# Patient Record
Sex: Female | Born: 1940
Health system: Southern US, Community
[De-identification: ages and names within clinical notes are randomized; demographics above are authoritative.]

## PROBLEM LIST (undated history)

## (undated) DIAGNOSIS — K859 Acute pancreatitis without necrosis or infection, unspecified: Secondary | ICD-10-CM

## (undated) DIAGNOSIS — I2510 Atherosclerotic heart disease of native coronary artery without angina pectoris: Secondary | ICD-10-CM

## (undated) DIAGNOSIS — K219 Gastro-esophageal reflux disease without esophagitis: Secondary | ICD-10-CM

## (undated) DIAGNOSIS — D649 Anemia, unspecified: Secondary | ICD-10-CM

## (undated) DIAGNOSIS — I10 Essential (primary) hypertension: Secondary | ICD-10-CM

## (undated) DIAGNOSIS — IMO0002 Reserved for concepts with insufficient information to code with codable children: Secondary | ICD-10-CM

## (undated) DIAGNOSIS — E7800 Pure hypercholesterolemia, unspecified: Secondary | ICD-10-CM

## (undated) DIAGNOSIS — N189 Chronic kidney disease, unspecified: Secondary | ICD-10-CM

## (undated) DIAGNOSIS — K635 Polyp of colon: Secondary | ICD-10-CM

## (undated) DIAGNOSIS — R809 Proteinuria, unspecified: Secondary | ICD-10-CM

## (undated) HISTORY — DX: Acute pancreatitis without necrosis or infection, unspecified: K85.90

## (undated) HISTORY — DX: Anemia, unspecified: D64.9

## (undated) HISTORY — DX: Reserved for concepts with insufficient information to code with codable children: IMO0002

## (undated) HISTORY — DX: Gastro-esophageal reflux disease without esophagitis: K21.9

## (undated) HISTORY — DX: Chronic kidney disease, unspecified: N18.9

## (undated) HISTORY — DX: Atherosclerotic heart disease of native coronary artery without angina pectoris: I25.10

## (undated) HISTORY — DX: Essential (primary) hypertension: I10

## (undated) HISTORY — PX: ANKLE SURGERY: SHX546

## (undated) HISTORY — DX: Pure hypercholesterolemia, unspecified: E78.00

## (undated) HISTORY — DX: Proteinuria, unspecified: R80.9

## (undated) HISTORY — PX: BACK SURGERY: SHX140

## (undated) HISTORY — DX: Polyp of colon: K63.5

## (undated) HISTORY — PX: ANGIOPLASTY: SHX39

## (undated) HISTORY — PX: TONSILLECTOMY: SUR1361

## (undated) HISTORY — PX: ABDOMINAL HYSTERECTOMY: SUR658

---

## 1998-10-03 ENCOUNTER — Encounter: Payer: Self-pay | Admitting: Internal Medicine

## 1998-10-03 ENCOUNTER — Ambulatory Visit (HOSPITAL_COMMUNITY): Admission: RE | Admit: 1998-10-03 | Discharge: 1998-10-03 | Payer: Self-pay | Admitting: Internal Medicine

## 1999-02-23 ENCOUNTER — Ambulatory Visit (HOSPITAL_COMMUNITY): Admission: RE | Admit: 1999-02-23 | Discharge: 1999-02-23 | Payer: Self-pay | Admitting: Gastroenterology

## 1999-08-24 ENCOUNTER — Encounter: Payer: Self-pay | Admitting: Emergency Medicine

## 1999-08-24 ENCOUNTER — Inpatient Hospital Stay (HOSPITAL_COMMUNITY): Admission: EM | Admit: 1999-08-24 | Discharge: 1999-08-26 | Payer: Self-pay | Admitting: Emergency Medicine

## 1999-12-23 ENCOUNTER — Encounter: Admission: RE | Admit: 1999-12-23 | Discharge: 2000-01-19 | Payer: Self-pay | Admitting: Internal Medicine

## 2000-08-18 ENCOUNTER — Encounter: Payer: Self-pay | Admitting: Gastroenterology

## 2000-08-18 ENCOUNTER — Encounter: Admission: RE | Admit: 2000-08-18 | Discharge: 2000-08-18 | Payer: Self-pay | Admitting: Gastroenterology

## 2000-09-10 ENCOUNTER — Emergency Department (HOSPITAL_COMMUNITY): Admission: EM | Admit: 2000-09-10 | Discharge: 2000-09-10 | Payer: Self-pay | Admitting: Emergency Medicine

## 2000-09-10 ENCOUNTER — Encounter: Payer: Self-pay | Admitting: Emergency Medicine

## 2001-05-02 ENCOUNTER — Encounter: Payer: Self-pay | Admitting: Emergency Medicine

## 2001-05-02 ENCOUNTER — Emergency Department (HOSPITAL_COMMUNITY): Admission: EM | Admit: 2001-05-02 | Discharge: 2001-05-02 | Payer: Self-pay | Admitting: Emergency Medicine

## 2001-06-14 ENCOUNTER — Encounter: Payer: Self-pay | Admitting: Emergency Medicine

## 2001-06-14 ENCOUNTER — Emergency Department (HOSPITAL_COMMUNITY): Admission: EM | Admit: 2001-06-14 | Discharge: 2001-06-14 | Payer: Self-pay | Admitting: Emergency Medicine

## 2001-06-29 ENCOUNTER — Encounter: Payer: Self-pay | Admitting: Family Medicine

## 2001-06-29 ENCOUNTER — Ambulatory Visit (HOSPITAL_COMMUNITY): Admission: RE | Admit: 2001-06-29 | Discharge: 2001-06-29 | Payer: Self-pay | Admitting: Family Medicine

## 2001-08-22 ENCOUNTER — Encounter: Admission: RE | Admit: 2001-08-22 | Discharge: 2001-09-26 | Payer: Self-pay | Admitting: Neurosurgery

## 2002-07-09 ENCOUNTER — Emergency Department (HOSPITAL_COMMUNITY): Admission: EM | Admit: 2002-07-09 | Discharge: 2002-07-09 | Payer: Self-pay | Admitting: Emergency Medicine

## 2003-01-27 ENCOUNTER — Emergency Department (HOSPITAL_COMMUNITY): Admission: EM | Admit: 2003-01-27 | Discharge: 2003-01-27 | Payer: Self-pay | Admitting: Emergency Medicine

## 2003-02-26 ENCOUNTER — Ambulatory Visit (HOSPITAL_COMMUNITY): Admission: RE | Admit: 2003-02-26 | Discharge: 2003-02-26 | Payer: Self-pay | Admitting: Neurosurgery

## 2003-02-26 ENCOUNTER — Encounter: Payer: Self-pay | Admitting: Neurosurgery

## 2003-03-19 ENCOUNTER — Encounter: Admission: RE | Admit: 2003-03-19 | Discharge: 2003-06-17 | Payer: Self-pay | Admitting: Neurosurgery

## 2004-03-23 ENCOUNTER — Ambulatory Visit: Payer: Self-pay | Admitting: Nurse Practitioner

## 2004-04-06 ENCOUNTER — Ambulatory Visit: Payer: Self-pay | Admitting: *Deleted

## 2004-04-16 ENCOUNTER — Ambulatory Visit: Payer: Self-pay | Admitting: Nurse Practitioner

## 2004-06-08 ENCOUNTER — Ambulatory Visit: Payer: Self-pay | Admitting: Nurse Practitioner

## 2004-09-14 ENCOUNTER — Ambulatory Visit: Payer: Self-pay | Admitting: Nurse Practitioner

## 2004-10-19 ENCOUNTER — Encounter: Admission: RE | Admit: 2004-10-19 | Discharge: 2004-10-19 | Payer: Self-pay | Admitting: Family Medicine

## 2004-11-25 ENCOUNTER — Emergency Department (HOSPITAL_COMMUNITY): Admission: EM | Admit: 2004-11-25 | Discharge: 2004-11-25 | Payer: Self-pay | Admitting: Family Medicine

## 2005-01-08 ENCOUNTER — Ambulatory Visit: Payer: Self-pay | Admitting: Cardiology

## 2005-01-18 ENCOUNTER — Ambulatory Visit: Payer: Self-pay

## 2005-01-20 ENCOUNTER — Ambulatory Visit: Payer: Self-pay

## 2005-02-01 ENCOUNTER — Ambulatory Visit (HOSPITAL_COMMUNITY): Admission: RE | Admit: 2005-02-01 | Discharge: 2005-02-01 | Payer: Self-pay | Admitting: Family Medicine

## 2005-03-15 ENCOUNTER — Ambulatory Visit: Payer: Self-pay | Admitting: Internal Medicine

## 2005-05-07 ENCOUNTER — Ambulatory Visit: Payer: Self-pay | Admitting: Internal Medicine

## 2005-09-08 ENCOUNTER — Emergency Department (HOSPITAL_COMMUNITY): Admission: EM | Admit: 2005-09-08 | Discharge: 2005-09-08 | Payer: Self-pay | Admitting: Family Medicine

## 2005-09-18 ENCOUNTER — Emergency Department (HOSPITAL_COMMUNITY): Admission: EM | Admit: 2005-09-18 | Discharge: 2005-09-18 | Payer: Self-pay | Admitting: Emergency Medicine

## 2006-01-31 ENCOUNTER — Emergency Department (HOSPITAL_COMMUNITY): Admission: EM | Admit: 2006-01-31 | Discharge: 2006-01-31 | Payer: Self-pay | Admitting: Family Medicine

## 2006-08-08 ENCOUNTER — Emergency Department (HOSPITAL_COMMUNITY): Admission: EM | Admit: 2006-08-08 | Discharge: 2006-08-09 | Payer: Self-pay | Admitting: Emergency Medicine

## 2007-08-10 ENCOUNTER — Ambulatory Visit (HOSPITAL_COMMUNITY): Admission: RE | Admit: 2007-08-10 | Discharge: 2007-08-10 | Payer: Self-pay | Admitting: Nephrology

## 2007-12-08 ENCOUNTER — Ambulatory Visit: Payer: Self-pay | Admitting: Cardiology

## 2007-12-08 ENCOUNTER — Emergency Department (HOSPITAL_COMMUNITY): Admission: EM | Admit: 2007-12-08 | Discharge: 2007-12-08 | Payer: Self-pay | Admitting: Family Medicine

## 2008-01-08 ENCOUNTER — Ambulatory Visit: Payer: Self-pay | Admitting: Internal Medicine

## 2008-01-08 ENCOUNTER — Encounter: Payer: Self-pay | Admitting: Emergency Medicine

## 2008-01-09 ENCOUNTER — Encounter: Payer: Self-pay | Admitting: Cardiology

## 2008-01-09 ENCOUNTER — Encounter (INDEPENDENT_AMBULATORY_CARE_PROVIDER_SITE_OTHER): Payer: Self-pay | Admitting: Internal Medicine

## 2008-01-09 ENCOUNTER — Ambulatory Visit: Payer: Self-pay | Admitting: Internal Medicine

## 2008-01-09 ENCOUNTER — Inpatient Hospital Stay (HOSPITAL_COMMUNITY): Admission: EM | Admit: 2008-01-09 | Discharge: 2008-01-10 | Payer: Self-pay | Admitting: Cardiology

## 2008-01-10 ENCOUNTER — Encounter: Payer: Self-pay | Admitting: Cardiology

## 2008-01-11 ENCOUNTER — Ambulatory Visit: Payer: Self-pay

## 2008-01-11 ENCOUNTER — Encounter: Payer: Self-pay | Admitting: Cardiology

## 2008-01-15 ENCOUNTER — Ambulatory Visit: Payer: Self-pay

## 2008-02-12 ENCOUNTER — Ambulatory Visit: Payer: Self-pay | Admitting: Cardiology

## 2009-03-03 ENCOUNTER — Emergency Department (HOSPITAL_COMMUNITY): Admission: EM | Admit: 2009-03-03 | Discharge: 2009-03-03 | Payer: Self-pay | Admitting: Emergency Medicine

## 2009-11-21 ENCOUNTER — Emergency Department (HOSPITAL_COMMUNITY): Admission: EM | Admit: 2009-11-21 | Discharge: 2009-11-22 | Payer: Self-pay | Admitting: Emergency Medicine

## 2010-02-24 DIAGNOSIS — E119 Type 2 diabetes mellitus without complications: Secondary | ICD-10-CM

## 2010-02-24 DIAGNOSIS — I10 Essential (primary) hypertension: Secondary | ICD-10-CM | POA: Insufficient documentation

## 2010-02-24 DIAGNOSIS — N189 Chronic kidney disease, unspecified: Secondary | ICD-10-CM

## 2010-02-24 DIAGNOSIS — I00 Rheumatic fever without heart involvement: Secondary | ICD-10-CM | POA: Insufficient documentation

## 2010-02-27 ENCOUNTER — Ambulatory Visit: Payer: Self-pay | Admitting: Cardiology

## 2010-02-27 DIAGNOSIS — E785 Hyperlipidemia, unspecified: Secondary | ICD-10-CM | POA: Insufficient documentation

## 2010-05-18 ENCOUNTER — Telehealth (INDEPENDENT_AMBULATORY_CARE_PROVIDER_SITE_OTHER): Payer: Self-pay | Admitting: *Deleted

## 2010-05-22 ENCOUNTER — Encounter: Payer: Self-pay | Admitting: Cardiology

## 2010-08-04 NOTE — Progress Notes (Signed)
  Walk in Patient Form Recieved " Pt needs Surgical Cleaance paper left from [facility] orthopaedics" sent to Message Nurse. Joelene Millin Mesiemore  May 18, 2010 3:52 PM

## 2010-08-04 NOTE — Assessment & Plan Note (Signed)
Summary: 2 year fu follow up   Visit Type:  Follow-up Primary Provider:  Dr. Dennard Schaumann  CC:  chest pain.  History of Present Illness: The patient presents for followup of multiple cardiovascular risk factors. I had previously seen Emily Hughes for chest pain. Since that last visit in 2009 she has had no new symptoms. She denies chest pressure, neck or arm discomfort. She denies palpitations, presyncope or syncope. She denies PND or orthopnea. She has had no new weight gain or edema. She says Emily Hughes sugar is well controlled and Emily Hughes blood pressure well controlled as well. She doesn't exercise routinely being limited by knee pain though she did join the [employer]. Unfortunately she has yet to go.   Current Medications (verified): 1)  Lantus 100 Unit/ml Soln (Insulin Glargine) .... As Directed 2)  Amaryl 4 Mg Tabs (Glimepiride) .Marland Kitchen.. 1 By Mouth Daily 3)  Furosemide 40 Mg Tabs (Furosemide) .... 2 Qam and 1 Qpm 4)  Calcitriol 0.5 Mcg Caps (Calcitriol) .Marland Kitchen.. 1 Po Daily 5)  Metoprolol Succinate 25 Mg Xr24h-Tab (Metoprolol Succinate) .Marland Kitchen.. 1 By Mouth Daily 6)  Simvastatin 40 Mg Tabs (Simvastatin) .Marland Kitchen.. 1 By Mouth Daily 7)  Diltiazem Hcl Er Beads 300 Mg Xr24h-Cap (Diltiazem Hcl Er Beads) .Marland Kitchen.. 1 By Mouth Daily 8)  Diovan 160 Mg Tabs (Valsartan) .Marland Kitchen.. 1 By Mouth Two Times A Day 9)  Clonidine Hcl 0.1 Mg Tabs (Clonidine Hcl) .Marland Kitchen.. 1 By Mouth Two Times A Day 10)  Levothroid 50 Mcg Tabs (Levothyroxine Sodium) .Marland Kitchen.. 1 By Mouth Daily  Allergies (verified): 1)  ! * Ivp Dye  Past History:  Past Medical History: Reviewed history from 02/24/2010 and no changes required.  Hypertension since 1990s, diabetes mellitus since   1992, chronic renal insufficiency, rheumatic fever at age 70, morbid   obesity, back surgery, degenerative joint disease, hysterectomy,   tonsillectomy, and ankle surgery.   Past Surgical History: Back surgery Hysterectomy Tonsillectomy  Review of Systems       As stated in the HPI and negative for all  other systems.   Vital Signs:  Patient profile:   70 year old female Height:      63 inches Weight:      255 pounds BMI:     45.33 Pulse rate:   78 / minute Resp:     18 per minute BP sitting:   130 / 70  (right arm)  Vitals Entered By: Levora Angel, CNA (February 27, 2010 2:02 PM)  Physical Exam  General:  Well developed, well nourished, in no acute distress. Head:  normocephalic and atraumatic Eyes:  PERRLA/EOM intact; conjunctiva and lids normal. Mouth:  Teeth, gums and palate normal. Oral mucosa normal. Neck:  Neck supple, no JVD. No masses, thyromegaly or abnormal cervical nodes. Chest Wall:  no deformities or breast masses noted Lungs:  Clear bilaterally to auscultation and percussion. Abdomen:  Bowel sounds positive; abdomen soft and non-tender without masses, organomegaly, or hernias noted. No hepatosplenomegaly.  (Exam compromised by morbid obesity.) Msk:  Back normal, normal gait. Muscle strength and tone normal. Extremities:  No clubbing or cyanosis. Neurologic:  Alert and oriented x 3. Skin:  Intact without lesions or rashes. Cervical Nodes:  no significant adenopathy Inguinal Nodes:  Exam compromised by morbid obesity Psych:  Normal affect.   Detailed Cardiovascular Exam  Neck    Carotids: Carotids full and equal bilaterally without bruits.      Neck Veins: Normal, no JVD.    Heart    Inspection: no deformities  or lifts noted.      Palpation: normal PMI with no thrills palpable.      Auscultation: regular rate and rhythm, S1, S2 without murmurs, rubs, gallops, or clicks.    Vascular    Abdominal Aorta: no palpable masses, pulsations, or audible bruits.      Femoral Pulses: normal femoral pulses bilaterally.      Pedal Pulses: normal pedal pulses bilaterally.      Radial Pulses: normal radial pulses bilaterally.      Peripheral Circulation: no clubbing, cyanosis, or edema noted with normal capillary refill.     EKG  Procedure date:   02/27/2010  Findings:      Sinus rhythm, rate 78, left ventricle hypertrophy with repolarization changes, inferior Q waves, no change from previous EKGs  Impression & Recommendations:  Problem # 1:  HYPERTENSION (ICD-401.9) The patient's blood pressure is controlled. She will continue the meds as listed.  Problem # 2:  DYSLIPIDEMIA (ICD-272.4) With the FDA warning she cannot be on simvastatin at this dose and Cardizem. I will switch Emily Hughes to pravastatin 80 mg daily. She can get a lipid and liver at Emily Hughes primary care physician's office in 8 weeks.  Problem # 3:  OBESITY, MORBID (ICD-278.01) We had a long discussion about dietary restrictions and suggestions for exercise.  Patient Instructions: 1)  Stop simvastatin. 2)  Start pravastatin 80mg  once daily. 3)  You will need to have a cholesterol and liver check with your primary care doctor in 8 weeks. 4)  We will see you back on an as needed basis. Prescriptions: PRAVASTATIN SODIUM 80 MG TABS (PRAVASTATIN SODIUM) Take one tablet by mouth daily at bedtime  #30 x 11   Entered by:   Alvis Lemmings, RN, BSN   Authorized by:   Minus Breeding, MD, [facility]   Signed by:   Alvis Lemmings, RN, BSN on 02/27/2010   Method used:   Electronically to        [facility] (retail)       [place], [place]  [id number]       Ph: [phone]       Fax: [phone]   RxIDPS:[id number]  I have reviewed and approved all prescriptions at the time of this visit. Minus Breeding, MD, [facility]  February 27, 2010 5:29 PM

## 2010-08-06 NOTE — Letter (Signed)
Summary: [facility] Surgical Clearance   [facility] Surgical Clearance   Imported By: Sallee Provencal 07/16/2010 16:30:29  _____________________________________________________________________  External Attachment:    Type:   Image     Comment:   External Document

## 2010-09-21 LAB — URINALYSIS, ROUTINE W REFLEX MICROSCOPIC
Glucose, UA: NEGATIVE mg/dL
Protein, ur: NEGATIVE mg/dL
Specific Gravity, Urine: 1.014 (ref 1.005–1.030)
pH: 5 (ref 5.0–8.0)

## 2010-09-21 LAB — CBC
HCT: 30.1 % — ABNORMAL LOW (ref 36.0–46.0)
Hemoglobin: 9.9 g/dL — ABNORMAL LOW (ref 12.0–15.0)
MCHC: 32.9 g/dL (ref 30.0–36.0)
RDW: 14.1 % (ref 11.5–15.5)

## 2010-09-21 LAB — GLUCOSE, CAPILLARY
Glucose-Capillary: 111 mg/dL — ABNORMAL HIGH (ref 70–99)
Glucose-Capillary: 186 mg/dL — ABNORMAL HIGH (ref 70–99)

## 2010-09-21 LAB — DIFFERENTIAL
Basophils Absolute: 0.1 10*3/uL (ref 0.0–0.1)
Basophils Relative: 1 % (ref 0–1)
Eosinophils Absolute: 0.2 10*3/uL (ref 0.0–0.7)
Eosinophils Relative: 2 % (ref 0–5)
Monocytes Absolute: 0.6 10*3/uL (ref 0.1–1.0)

## 2010-09-21 LAB — BASIC METABOLIC PANEL
CO2: 25 mEq/L (ref 19–32)
Glucose, Bld: 69 mg/dL — ABNORMAL LOW (ref 70–99)
Potassium: 3.6 mEq/L (ref 3.5–5.1)
Sodium: 144 mEq/L (ref 135–145)

## 2010-09-21 LAB — URINE MICROSCOPIC-ADD ON

## 2010-11-17 NOTE — Assessment & Plan Note (Signed)
[facility] OFFICE NOTE   NAME:Scollard, MYEESHA                      MRN:          [id number]  DATE:12/08/2007                            DOB:          May 30, 1941    PRIMARY CARE PHYSICIAN:  [facility].   REASON FOR PRESENTATION:  Evaluate the patient with multiple  cardiovascular risk factors and previous nonobstructive coronary  disease.   HISTORY OF PRESENT ILLNESS:  The patient is 70 years old.  She was last  seen in this clinic in July 2006.  She had some chest discomfort.  She  had a previous cardiac catheterization in the 1980s with a 50% diagonal  lesion.  The stress perfusion study that she had in 2006 demonstrated no  evidence of ischemia or scar.  She had a previous well preserved  ejection fraction.   The patient says she has had no new cardiovascular symptoms since that  time.  However, she does get treated for significant cardiovascular risk  factors.  She is very limited in her activities because of bilateral  knee pain.  She has morbid obesity.  However, she is committed to losing  weight.   With her activities of daily living which includes vacuuming or walking  through the grocery store, she does not have any chest pressure, neck or  arm discomfort.  She does not have any palpitations, presyncope,  syncope.  She has no PND or orthopnea.   PAST MEDICAL HISTORY:  1. Hypertension since the 1990s.  2. Diabetes mellitus since 1992.  3. Chronic renal insufficiency followed by Dr. Mercy Moore.  4. Rheumatic fever at the age of 29.  70. Morbid obesity.  6. Back surgery.  7. Hysterectomy.  8. Tonsillectomy.  9. Ankle surgery.   ALLERGIES:  Intolerance to INTRAVENOUS DYE.   MEDICATIONS:  1. Zemplar 1 mcg daily.  2. Lantus.  3. Nexium 40 mg daily.  4. Metoprolol 25 mg b.i.d.  5. Glimepiride 4 mg daily.  6. Clonidine 0.1 mg b.i.d.  7. Furosemide 40 mg b.i.d.  8. Simvastatin  40 mg daily.  9. Diovan 160 mg b.i.d.  10.STS liquid 60 mg daily.  11.Diltiazem 300 mg daily.  12.Levothyroxine.   REVIEW OF SYSTEMS:  As stated in HPI and otherwise negative for other  systems.   PHYSICAL EXAMINATION:  GENERAL:  The patient is in no distress.  VITAL SIGNS:  Blood pressure 135/73, heart rate 81 and regular.  HEENT:  Eyelids are unremarkable.  Pupils are equal, round and react to  light.  Fundi not visualized. Oral mucosa unremarkable.  NECK:  No jugular venous distention at 45 degrees, carotid upstroke  brisk and symmetric, no bruits, no thyromegaly.  LYMPHATICS:  No cervical, axillary, inguinal adenopathy.  LUNGS:  Clear to auscultation bilaterally.  BACK:  No costovertebral angle tenderness.  CHEST:  Unremarkable.  HEART:  PMI not displaced or sustained, S1-S2 within normal limits, no  S3, no S4, no clicks, rubs, murmurs.  ABDOMEN:  Morbidly obese, positive bowel sounds normal in frequency and  pitch, no bruits, rebound, guarding, no midline pulsatile mass,  no  hepatomegaly, no splenomegaly.  SKIN:  No rashes, no nodules.  EXTREMITIES:  2+ pulses throughout, no edema, cyanosis or clubbing.  NEURO:  Oriented to person, place, time.  Cranial nerves II-XII are  grossly intact, motor grossly intact throughout.   ASSESSMENT/PLAN:  1. Coronary disease. The patient had nonobstructive disease several      years ago and a negative stress perfusion study in 2006.  Since      that time, she has had no new symptoms.  She does have      cardiovascular risk factors which are not well controlled as she      has not participated aggressively.  However in the absence of new      symptoms, I would not suggest further cardiovascular testing.      However, she should continue more aggressively with primary risk      reduction.  We had a long discussion about this.  2. Obesity.  This is her most significant problem.  I applaud attempts      at weight loss.  We discussed Weight  Watchers approach.  3. Chronic renal insufficiency.  This is followed by Dr. Mercy Moore.      No further therapy is warranted.  4. Hypertension.  Blood pressure is actually well controlled.  She      will continue on the meds as listed.  5. Dyslipidemia.  She had a reasonable lipid profile recently.  Her      triglycerides were 105, HDL was low at 38, the LDL is 81.  We      talked about this and perhaps if she can begin to ambulate more or      if she loses weight, we will see her HDL go up.  6. Hypothyroidism.  She will continue with thyroid replacement.   FOLLOW UP:  I would like to see her back in about 2 years at which point  I most likely would do a stress test as it will have been 5 years since  the last one.  However, I would have a low threshold to evaluate her  further if she has any new symptoms going forward.     Minus Breeding, MD, [facility]  Electronically Signed    JH/MedQ  DD: 12/08/2007  DT: 12/09/2007  Job #: [id number]   cc:   [place]

## 2010-11-17 NOTE — Discharge Summary (Signed)
Emily Hughes, Emily Hughes NO.:  [id number]   MEDICAL RECORD NO.:  [id number]         PATIENT TYPE:  CINP   LOCATION:                               FACILITY:  [facility]   PHYSICIAN:  Minus Breeding, MD, FACCDATE OF BIRTH:  [birth year]   DATE OF ADMISSION:  01/09/2008  DATE OF DISCHARGE:                               DISCHARGE SUMMARY   DISCHARGING DIAGNOSES:  Chest pain, negative cardiac enzymes, EKG  without acute changes, the patient pending outpatient stress Myoview at  the time of discharge.   Past medical history includes hypertension, diabetes, chronic renal  insufficiency, rheumatic fever at the age of 79, morbid obesity, an  intolerance to IVP dye, and cardiac catheterization in the 1980s with a  50% diagonal lesion.   Procedures this admission include:  1. A VQ scan on January 09, 2008, low probability for pulmonary emboli.  2. A 2-D echocardiogram on January 09, 2008, showing EF of 55%.  3. Stress perfusion study in 2006 showed no evidence of ischemia or      scar.   HOSPITAL COURSE:  [name] is a 70 year old African American female  with past medical history as stated above with multiple risk factors for  coronary artery disease presented on the day of admission complaining of  chest discomfort.  She was admitted for observation.  No acute EKG  changes noted.  She did have nonspecific T-wave abnormalities unchanged  from previous tracings.  Cardiac enzymes were cycled and negative.  The  patient was seen by Dr. Percival Spanish, felt stable for discharge with  outpatient workup.  Arrange for the patient to have a stress Myoview  done in our office on the day following discharge.  The patient given  written and verbal instructions regarding a stress test prior to  discharge from [facility].  She is scheduled for testing on January 11, 2008, at 8:15 a.m. at our office.  Followup with Dr. Percival Spanish on February 12, 2008, at 4:30.   At the time of her discharge, her medications  include Nexium 40 mg  daily, clonidine 0.1 mg b.i.d., metoprolol 25 b.i.d., furosemide 40 mg  b.i.d., simvastatin 40 mg daily, Diovan 160 mg b.i.d. diltiazem 300 mg  daily, levothyroxine resume previous dose, Lantus insulin as previously  prescribed, Zemplar 1 mcg daily, and glyburide 4 mg daily.   DURATION OF DISCHARGE ENCOUNTER:  Greater than 30 minutes.      Rosanne Sack, ACNP      Minus Breeding, MD, [facility]  Electronically Signed    MB/MEDQ  D:  02/05/2008  T:  02/05/2008  Job:  [id number]

## 2010-11-17 NOTE — Assessment & Plan Note (Signed)
[facility] OFFICE NOTE   NAME:Emily Hughes, Emily Hughes                      MRN:          [id number]  DATE:02/12/2008                            DOB:          [birth year]    PRIMARY CARE PHYSICIAN:  [facility].   REASON FOR PRESENTATION:  Evaluate the patient with recent  hospitalization for chest pain.   HISTORY OF PRESENT ILLNESS:  The patient was hospitalized on January 09, 2008, with chest discomfort.  She had a V/Q scan, which is low  probability for pulmonary emboli.  Echocardiogram demonstrated well-  preserved ejection fraction.  Stress perfusion study done as an  outpatient demonstrated an EF of 62% with no evidence of ischemia or  scar.   Since that time, she has had no further chest discomfort.  She has had  no neck or arm discomfort.  She has had no palpitations, presyncope, or  syncope.  She is not able to exercise because of bilateral knee pain.   PAST MEDICAL HISTORY:  Hypertension since 1990s, diabetes mellitus since  1992, chronic renal insufficiency, rheumatic fever at age 51, morbid  obesity, back surgery, degenerative joint disease, hysterectomy,  tonsillectomy, and ankle surgery.   ALLERGIES/INTOLERANCES:  INTRAVENOUS DYE.   MEDICATIONS:  1. Zemplar.  2. Lantus.  3. Nexium 40 mg daily.  4. Metoprolol 25 mg b.i.d.  5. Clonidine 0.1 mg b.i.d.  6. Furosemide 40 mg b.i.d.  7. Simvastatin 40 mg daily.  8. Diovan 160 mg b.i.d.  9. SPS liquid 60 mg daily.  10.Diltiazem 300 mg daily.  11.Levothyroxine.  12.Glimepiride 4 mg daily.   REVIEW OF SYSTEMS:  As stated in the HPI and otherwise negative for  other systems.   PHYSICAL EXAMINATION:  GENERAL:  The patient is in no distress.  VITAL SIGNS:  Blood pressure 122/65, heart rate 76 and regular, weight  240 pounds, and body mass index 45.  NECK:  No jugular venous distention at 45 degrees; carotid upstroke  brisk and symmetric;  no bruits, no thyromegaly.  LYMPHATICS:  No cervical, axillary, or inguinal adenopathy.  LUNGS:  Clear to auscultation bilaterally.  BACK:  No costovertebral tenderness.  CHEST:  Unremarkable.  HEART:  PMI not displaced or sustained; S1 and S2 within normal limits,  no S3, no S4, no clicks, no rubs, no murmurs.  ABDOMEN:  Morbidly obese; positive bowel sounds, normal in frequency and  pitch; no bruits, no rebound, no guarding, no midline pulsatile mass, no  organomegaly.  SKIN:  No rashes, no nodules.  EXTREMITIES:  2+ pulse, no edema.   ASSESSMENT AND PLAN:  1. Chest pain.  The patient had negative stress perfusion study.  She      is no longer having symptoms.  No further cardiovascular testing is      suggested.  2. Obesity.  We had a long discussion about the need for weight loss.      She thinks she is going to try Nutrisystem, which is reasonable as      a low glycemic index diet.  She needs exercise  too.  I have      encouraged her to try to sign up with the [facility].  She needs to do      water aerobics to try to lose weight.  3. Followup.  I will see the patient back as needed.     Minus Breeding, MD, [facility]  Electronically Signed    JH/MedQ  DD: 02/12/2008  DT: 02/13/2008  Job #: [id number]   cc:   [place]

## 2010-11-17 NOTE — H&P (Signed)
NAMEFREIDY, Emily Hughes NO.:  [id number]   MEDICAL RECORD NO.:  [id number]          PATIENT TYPE:  INP   LOCATION:  2626                         FACILITY:  [facility]   PHYSICIAN:  Johnney Ou, MD DATE OF BIRTH:  Mar 07, 1941   DATE OF ADMISSION:  01/09/2008  DATE OF DISCHARGE:                              HISTORY & PHYSICAL   CHIEF COMPLAINT:  Chest pain   HISTORY OF PRESENT ILLNESS:  The patient is a 70 year old African  American female with a past medical history significant for  hypertension, diabetes, chronic renal deficiency, rheumatic fever at the  age of 22, and morbid obesity, here with a chief complaint of acute onset  of chest pain.  She has been worked up for chest pain in the past and  had a previous cardiac catheterization in the 1980s with a 50% diagonal  lesion.  However, a follow-up stress test in 2006, was normal.  She  states that the past couple days, she has had pressure-like squeezing  chest pain that the patient finds difficult to describe.  She states  that this pain occurs only with exertion and if she is trying to get up  from supine position.  She states that she feels slightly short of  breath; however, she denies syncope, presyncope, paroxysmal nocturnal  dyspnea, increased lower extremity edema or diaphoresis with this.  She  states that this pain is kind of like the pain that makes her sick.  Otherwise, she has been in her usual state of health prior to her onset  of symptoms yesterday, the day prior to admission.  She is currently  pain free.   PAST MEDICAL HISTORY:  1. Hypertension.  2. Diabetes mellitus.  3. Chronic renal insufficiency.  4. Rheumatic fever at the age of 27.  98. Morbid abuse.  6. Status post back surgery.  7. Hysterectomy.  8. Tonsillectomy.  9. Status post ankle surgery.   ALLERGIES:  SHE HAS AN INTOLERANCE TO IV DYE.   MEDICATIONS:  1. Zemplar 1 mcg daily.  2. Lantus as directed.  3. Nexium 40 mg  daily.  4. Metoprolol 25 mg twice daily.  5. Glimepiride 4 mg daily.  6. Clonidine 0.1 mg twice daily.  7. Furosemide 40 mg twice daily.  8. Simvastatin 40 mg daily.  9. Diovan 160 mg twice daily.  10.STL liquid 60 mg daily.  11.Diltiazem 300 mg daily.  12.Levothyroxine.   REVIEW OF SYSTEMS:  All 14 systems were reviewed and were negative  except as mentioned in the HPI.   PHYSICAL EXAMINATION:  VITAL SIGNS:  Blood pressure was 136/65, satting  95% on room air.  Pulse 65.  She was breathing 16 times minute.  She is  afebrile.  GENERAL:  She is a 70 year old obese African American female appearing  stated age in no acute distress.  HEENT:  Pupils are equal, round and react to light accommodation.  Anicteric sclerae.  NECK:  No jugulovenous distention.  No thyromegaly.  CARDIOVASCULAR:  Regular rate and rhythm.  No murmurs, rubs or gallops.  LUNGS:  Clear to auscultation bilaterally.  ABDOMEN:  Nontender, nondistended.  Positive bowel sounds.  No masses.  EXTREMITIES:  Trace edema.  No clubbing, cyanosis or edema.  SKIN:  Warm, dry and intact.  No rashes.  NEURO:  Alert and oriented x3.  Cranial nerves II-XII grossly intact.  No focal neurologic deficits.  PSYCH:  Mood and affect are appropriate.  PULSES:  2+ pulses throughout.   LABORATORY DATA:  Radiology review:  Chest x-ray shows bibasilar  atelectasis, otherwise no acute process.  Labs show a troponin of less  than 0.05.  Creatinine was 2.1, this is her baseline.  Hemoglobin was  31.0 and white count was 7.0.  Platelet count 267.  EKG shows normal  sinus rhythm with nonspecific T-wave abnormalities that is unchanged  from previous tracing.   ASSESSMENT/PLAN:  38. A 70 year old [place] American female with coronary artery disease      with symptoms consistent with crescendo angina.  The symptoms are      somewhat atypical though her past medical history and risk factors      make her a high risk for existing coronary  disease.  I will treat      her as unstable angina at this point with the consideration for      early invasive strategy.  Will rule her out for myocardial      infarction, cardiac enzymes and repeat 12-lead EKG for recurrence      of chest pain.  Will continue all current medical therapy,      including stat medication, aspirin, beta-blocker and ACE inhibitor.      She will need to be premedicated before a cardiac catheterization      if that is the case.  2. Hypertension.  Her blood pressure is currently goal, continue.      Current current medical therapy.      Johnney Ou, MD  Electronically Signed     BHH/MEDQ  D:  01/09/2008  T:  01/09/2008  Job:  [id number]

## 2010-12-12 ENCOUNTER — Inpatient Hospital Stay (INDEPENDENT_AMBULATORY_CARE_PROVIDER_SITE_OTHER)
Admission: RE | Admit: 2010-12-12 | Discharge: 2010-12-12 | Disposition: A | Discharge status: Home / Self Care | Payer: Medicare Other | Source: Ambulatory Visit | Attending: Emergency Medicine | Admitting: Emergency Medicine

## 2010-12-12 DIAGNOSIS — M109 Gout, unspecified: Secondary | ICD-10-CM

## 2011-01-05 ENCOUNTER — Emergency Department (HOSPITAL_COMMUNITY)
Admission: EM | Admit: 2011-01-05 | Discharge: 2011-01-06 | Disposition: A | Discharge status: Home / Self Care | Payer: Medicare Other | Attending: Emergency Medicine | Admitting: Emergency Medicine

## 2011-01-05 DIAGNOSIS — I2510 Atherosclerotic heart disease of native coronary artery without angina pectoris: Secondary | ICD-10-CM | POA: Insufficient documentation

## 2011-01-05 DIAGNOSIS — Z794 Long term (current) use of insulin: Secondary | ICD-10-CM | POA: Insufficient documentation

## 2011-01-05 DIAGNOSIS — I10 Essential (primary) hypertension: Secondary | ICD-10-CM | POA: Insufficient documentation

## 2011-01-05 DIAGNOSIS — T38801A Poisoning by unspecified hormones and synthetic substitutes, accidental (unintentional), initial encounter: Secondary | ICD-10-CM | POA: Insufficient documentation

## 2011-01-05 DIAGNOSIS — E119 Type 2 diabetes mellitus without complications: Secondary | ICD-10-CM | POA: Insufficient documentation

## 2011-01-05 DIAGNOSIS — T383X1A Poisoning by insulin and oral hypoglycemic [antidiabetic] drugs, accidental (unintentional), initial encounter: Secondary | ICD-10-CM | POA: Insufficient documentation

## 2011-01-05 LAB — GLUCOSE, CAPILLARY

## 2011-01-06 LAB — GLUCOSE, CAPILLARY
Glucose-Capillary: 128 mg/dL — ABNORMAL HIGH (ref 70–99)
Glucose-Capillary: 251 mg/dL — ABNORMAL HIGH (ref 70–99)

## 2011-01-29 ENCOUNTER — Encounter: Payer: Self-pay | Admitting: Family Medicine

## 2011-01-29 DIAGNOSIS — R809 Proteinuria, unspecified: Secondary | ICD-10-CM | POA: Insufficient documentation

## 2011-01-29 DIAGNOSIS — E7800 Pure hypercholesterolemia, unspecified: Secondary | ICD-10-CM | POA: Insufficient documentation

## 2011-01-29 DIAGNOSIS — K219 Gastro-esophageal reflux disease without esophagitis: Secondary | ICD-10-CM | POA: Insufficient documentation

## 2011-01-29 DIAGNOSIS — I2510 Atherosclerotic heart disease of native coronary artery without angina pectoris: Secondary | ICD-10-CM | POA: Insufficient documentation

## 2011-03-26 ENCOUNTER — Other Ambulatory Visit: Payer: Self-pay | Admitting: Cardiology

## 2011-04-01 LAB — CBC
HCT: 30.5 — ABNORMAL LOW
MCV: 87.1
MCV: 86.5
Platelets: 235
RBC: 3.52 — ABNORMAL LOW
WBC: 6.9
WBC: 7

## 2011-04-01 LAB — COMPREHENSIVE METABOLIC PANEL
AST: 18
Albumin: 3.2 — ABNORMAL LOW
Chloride: 106
Creatinine, Ser: 1.74 — ABNORMAL HIGH
GFR calc Af Amer: 36 — ABNORMAL LOW
Potassium: 3.5
Total Bilirubin: 0.6

## 2011-04-01 LAB — URINALYSIS, ROUTINE W REFLEX MICROSCOPIC
Bilirubin Urine: NEGATIVE
Ketones, ur: NEGATIVE
Nitrite: NEGATIVE
Protein, ur: NEGATIVE
Urobilinogen, UA: 0.2
pH: 6.5

## 2011-04-01 LAB — CARDIAC PANEL(CRET KIN+CKTOT+MB+TROPI)
CK, MB: 1.1
Relative Index: 0.5
Relative Index: 0.9
Total CK: 209 — ABNORMAL HIGH
Total CK: 212 — ABNORMAL HIGH
Troponin I: 0.01
Troponin I: <0.01

## 2011-04-01 LAB — POCT I-STAT, CHEM 8
BUN: 36 — ABNORMAL HIGH
Chloride: 107
Creatinine, Ser: 2.1 — ABNORMAL HIGH
Sodium: 141
TCO2: 23

## 2011-04-01 LAB — DIFFERENTIAL
Eosinophils Absolute: 0.2
Lymphocytes Relative: 41
Lymphs Abs: 2.9
Monocytes Relative: 8
Neutrophils Relative %: 47

## 2011-04-01 LAB — URINE MICROSCOPIC-ADD ON

## 2011-04-01 LAB — APTT: aPTT: 29

## 2011-04-01 LAB — IRON AND TIBC: Saturation Ratios: 14 — ABNORMAL LOW

## 2011-04-01 LAB — POCT CARDIAC MARKERS
CKMB, poc: 1.7
Myoglobin, poc: 181
Operator id: 264031
Troponin i, poc: <0.05

## 2011-04-01 LAB — D-DIMER, QUANTITATIVE: D-Dimer, Quant: 0.78 — ABNORMAL HIGH

## 2011-04-01 LAB — TSH: TSH: 3.031 (ref 0.350–4.500)

## 2011-04-01 LAB — LIPASE, BLOOD: Lipase: 18

## 2011-04-01 LAB — TRANSFERRIN: Transferrin: 151 — ABNORMAL LOW

## 2011-07-04 ENCOUNTER — Other Ambulatory Visit: Payer: Self-pay | Admitting: Cardiology

## 2011-08-09 ENCOUNTER — Other Ambulatory Visit: Payer: Self-pay | Admitting: Cardiology

## 2011-12-13 ENCOUNTER — Other Ambulatory Visit (HOSPITAL_COMMUNITY): Payer: Self-pay | Admitting: *Deleted

## 2011-12-13 ENCOUNTER — Encounter (HOSPITAL_COMMUNITY)
Admission: RE | Admit: 2011-12-13 | Discharge: 2011-12-13 | Disposition: A | Discharge status: Home / Self Care | Payer: Medicare Other | Source: Ambulatory Visit | Attending: Nephrology | Admitting: Nephrology

## 2011-12-13 DIAGNOSIS — D509 Iron deficiency anemia, unspecified: Secondary | ICD-10-CM | POA: Insufficient documentation

## 2011-12-13 MED ORDER — SODIUM CHLORIDE 0.9 % IV SOLN
INTRAVENOUS | Status: DC
  Administered 2011-12-13: 11:00:00 via INTRAVENOUS

## 2011-12-13 MED ORDER — FERUMOXYTOL INJECTION 510 MG/17 ML
510.0000 mg | INTRAVENOUS | Status: DC
  Administered 2011-12-13: 510 mg via INTRAVENOUS
  Filled 2011-12-13: qty 17

## 2011-12-18 ENCOUNTER — Emergency Department (HOSPITAL_COMMUNITY)
Admission: EM | Admit: 2011-12-18 | Discharge: 2011-12-18 | Disposition: A | Discharge status: Home / Self Care | Payer: Medicare Other | Source: Home / Self Care

## 2011-12-18 ENCOUNTER — Encounter (HOSPITAL_COMMUNITY): Payer: Self-pay

## 2011-12-18 ENCOUNTER — Emergency Department (INDEPENDENT_AMBULATORY_CARE_PROVIDER_SITE_OTHER): Payer: Medicare Other

## 2011-12-18 DIAGNOSIS — M1990 Unspecified osteoarthritis, unspecified site: Secondary | ICD-10-CM

## 2011-12-18 NOTE — ED Notes (Signed)
Pt has rt index finger pain since last pm and has had  similar episode in the past that resolved on its own.  She has hx of gout

## 2011-12-18 NOTE — ED Provider Notes (Signed)
History     CSN: [id number]  Arrival date & time 12/18/11  1606   First MD Initiated Contact with Patient 12/18/11 1718      Chief Complaint  Patient presents with  . Joint Pain     HPI  71 y/o female with swelling and pain in her right index finger which started about 2 days ago. Pain is 7/10, radiates to the hand occasionally and is worse with movement or palpation. Aspercreme helped the pain. This similar swelling occurred about a month ago but was milder and resolved on its own by the next day. There is no history of trauma but she has been using this hand more frequently. No redness in the area and no discharge noted.  She has had Gout in her toe in the past but this does not feel the same.   Past Medical History  Diagnosis Date  . High cholesterol   . Diabetes mellitus   . Hypertension   . Anemia   . CAD (coronary artery disease)   . Pancreatitis   . Colon polyps   . Proteinuria   . DDD (degenerative disc disease)   . CRI (chronic renal insufficiency)   . GERD (gastroesophageal reflux disease)     Past Surgical History  Procedure Date  . Back surgery   . Ankle surgery   . Tonsillectomy   . Angioplasty     History reviewed. No pertinent family history.  History  Substance Use Topics  . Smoking status: Not on file  . Smokeless tobacco: Not on file  . Alcohol Use: Not on file    OB History    Grav Para Term Preterm Abortions TAB SAB Ect Mult Living                  Review of Systems  Constitutional: Positive for activity change. Negative for fever, chills, diaphoresis, appetite change and fatigue.  Eyes: Negative for discharge.  Respiratory: Negative for cough and chest tightness.   Cardiovascular: Negative for chest pain, palpitations and leg swelling.  Musculoskeletal: Positive for joint swelling and arthralgias.  All other systems reviewed and are negative.    Allergies  Ace inhibitors; Actos; Dust mite extract; and Dye fdc red  Home  Medications   Current Outpatient Rx  Name Route Sig Dispense Refill  . ASPIRIN 81 MG PO TABS Oral Take 81 mg by mouth daily.      Marland Kitchen CLONIDINE HCL 0.1 MG PO TABS Oral Take 0.1 mg by mouth 2 (two) times daily.      Marland Kitchen DILTIAZEM HCL ER COATED BEADS 300 MG PO CP24 Oral Take 300 mg by mouth daily.      Marland Kitchen FERROUS FUMARATE ER 50 MG PO TBCR Oral Take 50 mg by mouth daily.      . FUROSEMIDE 40 MG PO TABS Oral Take 40 mg by mouth 2 (two) times daily.      Marland Kitchen GLIMEPIRIDE 4 MG PO TABS Oral Take 4 mg by mouth daily before breakfast.      . INSULIN ASPART 100 UNIT/ML [place] SOLN Subcutaneous Inject 6 Units into the skin 3 (three) times daily before meals.      . INSULIN GLARGINE 100 UNIT/ML [place] SOLN Subcutaneous Inject 38 Units into the skin at bedtime.      Marland Kitchen LEVOTHYROXINE SODIUM 50 MCG PO TABS Oral Take 50 mcg by mouth daily.      Marland Kitchen METOPROLOL TARTRATE 25 MG PO TABS Oral Take 25 mg by mouth  daily.      Marland Kitchen OMEPRAZOLE 20 MG PO CPDR Oral Take 20 mg by mouth daily.      Marland Kitchen PRAVACHOL 80 MG PO TABS  TAKE ONE TABLET AT BEDTIME. 30 each 12  . VALSARTAN 160 MG PO TABS Oral Take 160 mg by mouth daily.        BP 149/73  Pulse 70  Temp 98.1 F (36.7 C) (Oral)  Resp 17  SpO2 99%  Physical Exam  Constitutional: She appears well-developed and well-nourished. No distress.  HENT:  Head: Normocephalic and atraumatic.  Mouth/Throat: Oropharynx is clear and moist.  Eyes: EOM are normal. Pupils are equal, round, and reactive to light. Right eye exhibits no discharge. Left eye exhibits no discharge. No scleral icterus.  Cardiovascular: Normal rate, regular rhythm and normal heart sounds.   No murmur heard. Pulmonary/Chest: Effort normal and breath sounds normal. No respiratory distress. She has no wheezes. She has no rales. She exhibits no tenderness.  Abdominal: Soft. Bowel sounds are normal. There is no tenderness.  Musculoskeletal: She exhibits edema and tenderness.       Hands:      Fusiform fluctuant swelling of 1st  MCP with exquisite tenderness on the medial aspect. No warmth or erythema noted. No discharge. No palpable tendons. No abnormality in the rest of the hand.   Skin: She is not diaphoretic.    ED Course  Procedures (including critical care time)  Dg Finger Index Right  12/18/2011  *RADIOLOGY REPORT*  Clinical Data: Pain and swelling of the right index finger. History of gout.  RIGHT INDEX FINGER 2+V  Comparison: None.  Findings: There is no fracture or dislocation.  The patient has arthritis of both interphalangeal joints of the index finger. There is soft tissue swelling around the PIP joint with a small soft tissue calcification seen dorsally.  There are slight degenerative arthritic changes of the second metacarpal phalangeal joint.  IMPRESSION: Arthritic changes of the interphalangeal joints of the finger. Soft tissue swelling and soft tissue calcification is consistent with gout.  Original Report Authenticated By: Larey Seat, M.D.     MDM  1st MCP swelling- likely osteoarthritis. At this time will not treat for gout. Will splint and recommend she continue aspercreme. If it worsens over the next couple of days, she should either come back here or see her PCP for further treatment.        Debbe Odea, MD 12/18/11 2110

## 2011-12-18 NOTE — Discharge Instructions (Signed)
Osteoarthritis  Continue to use Aspercreme 3-4 times a day. If condition worsens in the next 2 days, you will need to come back or see your regular doctor. Continue to keep splint on.  Osteoarthritis is the most common form of arthritis. It is redness, soreness, and swelling (inflammation) affecting the cartilage. Cartilage acts as a cushion, covering the ends of bones where they meet to form a joint.  CAUSES  Over time, the cartilage begins to wear away. This causes bone to rub on bone. This produces pain and stiffness in the affected joints. Factors that contribute to this problem are:  Excessive body weight.   Age.   Overuse of joints.  SYMPTOMS   People with osteoarthritis usually experience joint pain, swelling, or stiffness.   Over time, the joint may lose its normal shape.   Small deposits of bone (osteophytes) may grow on the edges of the joint.   Bits of bone or cartilage can break off and float inside the joint space. This may cause more pain and damage.   Osteoarthritis can lead to depression, anxiety, feelings of helplessness, and limitations on daily activities.  The most commonly affected joints are in the:  Ends of the fingers.   Thumbs.   Neck.   Lower back.   Knees.   Hips.  DIAGNOSIS  Diagnosis is mostly based on your symptoms and exam. Tests may be helpful, including:  X-rays of the affected joint.   A computerized magnetic scan (MRI).   Blood tests to rule out other types of arthritis.   Joint fluid tests. This involves using a needle to draw fluid from the joint and examining the fluid under a microscope.  TREATMENT  Goals of treatment are to control pain, improve joint function, maintain a normal body weight, and maintain a healthy lifestyle. Treatment approaches may include:  A prescribed exercise program with rest and joint relief.   Weight control with nutritional education.   Pain relief techniques such as:   Properly applied heat and  cold.   Electric pulses delivered to nerve endings under the skin (transcutaneous electrical nerve stimulation, TENS).   Massage.   Certain supplements. Ask your caregiver before using any supplements, especially in combination with prescribed drugs.   Medicines to control pain, such as:   Acetaminophen.   Nonsteroidal anti-inflammatory drugs (NSAIDs), such as naproxen.   Narcotic or central-acting agents, such as tramadol. This drug carries a risk of addiction and is generally prescribed for short-term use.   Corticosteroids. These can be given orally or as injection. This is a short-term treatment, not recommended for routine use.   Surgery to reposition the bones and relieve pain (osteotomy) or to remove loose pieces of bone and cartilage. Joint replacement may be needed in advanced states of osteoarthritis.  HOME CARE INSTRUCTIONS  Your caregiver can recommend specific types of exercise. These may include:  Strengthening exercises. These are done to strengthen the muscles that support joints affected by arthritis. They can be performed with weights or with exercise bands to add resistance.   Aerobic activities. These are exercises, such as brisk walking or low-impact aerobics, that get your heart pumping. They can help keep your lungs and circulatory system in shape.   Range-of-motion activities. These keep your joints limber.   Balance and agility exercises. These help you maintain daily living skills.  Learning about your condition and being actively involved in your care will help improve the course of your osteoarthritis. SEEK MEDICAL CARE IF:  You feel hot or your skin turns red.   You develop a rash in addition to your joint pain.   You have an oral temperature above 102 F (38.9 C).  [place] of Arthritis and Musculoskeletal and Skin Diseases: www.niams.SouthExposed.es [employer] on Aging: http://kim-miller.com/ American College of  Rheumatology: www.rheumatology.org Document Released: 06/21/2005 Document Revised: 06/10/2011 Document Reviewed: 10/02/2009 [facility] Patient Information 2012 [place].

## 2011-12-20 ENCOUNTER — Encounter (HOSPITAL_COMMUNITY)
Admission: RE | Admit: 2011-12-20 | Discharge: 2011-12-20 | Disposition: A | Discharge status: Home / Self Care | Payer: Medicare Other | Source: Ambulatory Visit | Attending: Nephrology | Admitting: Nephrology

## 2011-12-20 MED ORDER — SODIUM CHLORIDE 0.9 % IV SOLN
INTRAVENOUS | Status: AC
  Administered 2011-12-20: 08:00:00 via INTRAVENOUS

## 2011-12-20 MED ORDER — FERUMOXYTOL INJECTION 510 MG/17 ML
510.0000 mg | INTRAVENOUS | Status: AC
  Administered 2011-12-20: 510 mg via INTRAVENOUS
  Filled 2011-12-20: qty 17

## 2012-10-04 ENCOUNTER — Telehealth: Payer: Self-pay | Admitting: Family Medicine

## 2012-10-04 MED ORDER — GLIMEPIRIDE 4 MG PO TABS: 4.0000 mg | ORAL_TABLET | Freq: Every day | ORAL | Status: DC

## 2012-10-04 NOTE — Telephone Encounter (Signed)
rx refilled.

## 2012-10-09 ENCOUNTER — Encounter (HOSPITAL_COMMUNITY): Payer: Self-pay | Admitting: Emergency Medicine

## 2012-10-09 ENCOUNTER — Emergency Department (HOSPITAL_COMMUNITY)
Admission: EM | Admit: 2012-10-09 | Discharge: 2012-10-09 | Disposition: A | Discharge status: Home / Self Care | Payer: Medicare Other | Attending: Emergency Medicine | Admitting: Emergency Medicine

## 2012-10-09 DIAGNOSIS — Y9229 Other specified public building as the place of occurrence of the external cause: Secondary | ICD-10-CM | POA: Insufficient documentation

## 2012-10-09 DIAGNOSIS — Z8742 Personal history of other diseases of the female genital tract: Secondary | ICD-10-CM | POA: Insufficient documentation

## 2012-10-09 DIAGNOSIS — E119 Type 2 diabetes mellitus without complications: Secondary | ICD-10-CM | POA: Insufficient documentation

## 2012-10-09 DIAGNOSIS — W010XXA Fall on same level from slipping, tripping and stumbling without subsequent striking against object, initial encounter: Secondary | ICD-10-CM | POA: Insufficient documentation

## 2012-10-09 DIAGNOSIS — Z860100 Personal history of colon polyps, unspecified: Secondary | ICD-10-CM | POA: Insufficient documentation

## 2012-10-09 DIAGNOSIS — S0990XA Unspecified injury of head, initial encounter: Secondary | ICD-10-CM | POA: Insufficient documentation

## 2012-10-09 DIAGNOSIS — Y93E1 Activity, personal bathing and showering: Secondary | ICD-10-CM | POA: Insufficient documentation

## 2012-10-09 DIAGNOSIS — Z8739 Personal history of other diseases of the musculoskeletal system and connective tissue: Secondary | ICD-10-CM | POA: Insufficient documentation

## 2012-10-09 DIAGNOSIS — Z87448 Personal history of other diseases of urinary system: Secondary | ICD-10-CM | POA: Insufficient documentation

## 2012-10-09 DIAGNOSIS — R51 Headache: Secondary | ICD-10-CM

## 2012-10-09 DIAGNOSIS — Z8719 Personal history of other diseases of the digestive system: Secondary | ICD-10-CM | POA: Insufficient documentation

## 2012-10-09 DIAGNOSIS — I2510 Atherosclerotic heart disease of native coronary artery without angina pectoris: Secondary | ICD-10-CM | POA: Insufficient documentation

## 2012-10-09 DIAGNOSIS — D649 Anemia, unspecified: Secondary | ICD-10-CM | POA: Insufficient documentation

## 2012-10-09 DIAGNOSIS — W19XXXD Unspecified fall, subsequent encounter: Secondary | ICD-10-CM

## 2012-10-09 DIAGNOSIS — Z8639 Personal history of other endocrine, nutritional and metabolic disease: Secondary | ICD-10-CM | POA: Insufficient documentation

## 2012-10-09 DIAGNOSIS — Z862 Personal history of diseases of the blood and blood-forming organs and certain disorders involving the immune mechanism: Secondary | ICD-10-CM | POA: Insufficient documentation

## 2012-10-09 DIAGNOSIS — S8990XA Unspecified injury of unspecified lower leg, initial encounter: Secondary | ICD-10-CM | POA: Insufficient documentation

## 2012-10-09 DIAGNOSIS — R110 Nausea: Secondary | ICD-10-CM | POA: Insufficient documentation

## 2012-10-09 DIAGNOSIS — Z7982 Long term (current) use of aspirin: Secondary | ICD-10-CM | POA: Insufficient documentation

## 2012-10-09 DIAGNOSIS — K219 Gastro-esophageal reflux disease without esophagitis: Secondary | ICD-10-CM | POA: Insufficient documentation

## 2012-10-09 DIAGNOSIS — S0993XA Unspecified injury of face, initial encounter: Secondary | ICD-10-CM | POA: Insufficient documentation

## 2012-10-09 DIAGNOSIS — S99929A Unspecified injury of unspecified foot, initial encounter: Secondary | ICD-10-CM | POA: Insufficient documentation

## 2012-10-09 DIAGNOSIS — Z79899 Other long term (current) drug therapy: Secondary | ICD-10-CM | POA: Insufficient documentation

## 2012-10-09 DIAGNOSIS — I10 Essential (primary) hypertension: Secondary | ICD-10-CM | POA: Insufficient documentation

## 2012-10-09 DIAGNOSIS — Z794 Long term (current) use of insulin: Secondary | ICD-10-CM | POA: Insufficient documentation

## 2012-10-09 NOTE — ED Notes (Signed)
Pt complains of "falling in the shower and hitting my head" Pt report positive LOC, and complains of a headache at this time.

## 2012-10-09 NOTE — ED Provider Notes (Signed)
History     CSN: [id number]  Arrival date & time 10/09/12  1810   First MD Initiated Contact with Patient 10/09/12 1831      Chief Complaint  Patient presents with  . Fall    (Consider location/radiation/quality/duration/timing/severity/associated sxs/prior treatment) HPI Comments: This is a 72 year old patient who presents today after a fall 1 week ago. She was in a hotel bathroom taking a shower. She fell because the bottom of the bathtub "was slick". She hit her head as well as her knees. She denies LOC and vomiting. At the time of the event she became nauseous. She is here now because she still has a mild left sided headache which she wanted to get checked out. She states it feels like she has arthritis in her head. She describes it as throbbing. She has some neck pain. Her pain does not radiate. Currently no nausea, vomiting, abdominal pain, neck stiffness, fever.   The history is provided by the patient. No language interpreter was used.    Past Medical History  Diagnosis Date  . High cholesterol   . Diabetes mellitus   . Hypertension   . Anemia   . CAD (coronary artery disease)   . Pancreatitis   . Colon polyps   . Proteinuria   . DDD (degenerative disc disease)   . CRI (chronic renal insufficiency)   . GERD (gastroesophageal reflux disease)     Past Surgical History  Procedure Laterality Date  . Back surgery    . Ankle surgery    . Tonsillectomy    . Angioplasty      No family history on file.  History  Substance Use Topics  . Smoking status: Never Smoker   . Smokeless tobacco: Not on file  . Alcohol Use: No    OB History   Grav Para Term Preterm Abortions TAB SAB Ect Mult Living                  Review of Systems  Constitutional: Negative for fever and chills.  Respiratory: Negative for shortness of breath.   Cardiovascular: Negative for chest pain.  Gastrointestinal: Negative for nausea, vomiting, abdominal pain and diarrhea.  Neurological:  Positive for headaches. Negative for syncope and light-headedness.  All other systems reviewed and are negative.    Allergies  Ace inhibitors; Actos; Dust mite extract; and Dye fdc red  Home Medications   Current Outpatient Rx  Name  Route  Sig  Dispense  Refill  . aspirin 81 MG tablet   Oral   Take 81 mg by mouth daily.           . cloNIDine (CATAPRES) 0.1 MG tablet   Oral   Take 0.1 mg by mouth 2 (two) times daily.           Marland Kitchen diltiazem (CARDIZEM CD) 300 MG 24 hr capsule   Oral   Take 300 mg by mouth daily.           . ferrous fumarate (FERRO-SEQUELS) 50 MG CR tablet   Oral   Take 50 mg by mouth daily.           . furosemide (LASIX) 40 MG tablet   Oral   Take 40 mg by mouth 2 (two) times daily.           Marland Kitchen glimepiride (AMARYL) 4 MG tablet   Oral   Take 1 tablet (4 mg total) by mouth daily before breakfast.   30 tablet  5   . insulin aspart (NOVOLOG) 100 UNIT/ML injection   Subcutaneous   Inject 6 Units into the skin 3 (three) times daily before meals.           . insulin glargine (LANTUS) 100 UNIT/ML injection   Subcutaneous   Inject 38 Units into the skin at bedtime.           Marland Kitchen levothyroxine (SYNTHROID, LEVOTHROID) 50 MCG tablet   Oral   Take 50 mcg by mouth daily.           . metoprolol tartrate (LOPRESSOR) 25 MG tablet   Oral   Take 25 mg by mouth daily.           Marland Kitchen omeprazole (PRILOSEC) 20 MG capsule   Oral   Take 20 mg by mouth daily.           Marland Kitchen PRAVACHOL 80 MG tablet      TAKE ONE TABLET AT BEDTIME.   30 each   12   . valsartan (DIOVAN) 160 MG tablet   Oral   Take 160 mg by mouth daily.             BP 135/56  Pulse 63  Temp(Src) 98.2 F (36.8 C) (Oral)  SpO2 99%  Physical Exam  Nursing note and vitals reviewed. Constitutional: She is oriented to person, place, and time. Vital signs are normal. She appears well-developed and well-nourished. She does not appear ill. No distress.  HENT:  Head: Normocephalic  and atraumatic.  Right Ear: External ear normal.  Left Ear: External ear normal.  Nose: Nose normal.  Mouth/Throat: Oropharynx is clear and moist.  Eyes: Conjunctivae, EOM and lids are normal. Pupils are equal, round, and reactive to light.  Neck: Trachea normal, normal range of motion and phonation normal. Neck supple. Muscular tenderness present. No spinous process tenderness present.  Cardiovascular: Normal rate, regular rhythm and normal heart sounds.   Pulmonary/Chest: Effort normal and breath sounds normal. No stridor. No respiratory distress. She has no wheezes. She has no rales.  Abdominal: Soft. She exhibits no distension.  Musculoskeletal: Normal range of motion.  No spinous process tenderness, no step-offs   Neurological: She is alert and oriented to person, place, and time. She has normal strength and normal reflexes. No cranial nerve deficit (III-XII) or sensory deficit. She exhibits normal muscle tone. Coordination and gait normal.  Skin: Skin is warm and dry. She is not diaphoretic. No erythema.  Psychiatric: She has a normal mood and affect. Her behavior is normal.    ED Course  Procedures (including critical care time)  Labs Reviewed - No data to display No results found.   1. Fall, subsequent encounter   2. Headache       MDM  Patient presents today after a mechanical fall 1 week ago. She has had a headache since then. No LOC, vomiting. Neuro exam WNL. No bony tenderness. Nexus criteria negative. No indication for imaging at this time. Discussed possibility she could have a concussion. Brain rest until symptoms resolve. Patient denied muscle relaxer for neck pain. She states she will continue to take tylenol at home. Return instructions given. Follow up with PCP. Vital signs stable for discharge. Patient / Family / Caregiver informed of clinical course, understand medical decision-making process, and agree with plan.       Elwyn Lade, PA-C 10/09/12  1922

## 2012-10-10 NOTE — ED Provider Notes (Signed)
Medical screening examination/treatment/procedure(s) were performed by non-physician practitioner and as supervising physician I was immediately available for consultation/collaboration.  Virgel Manifold, MD 10/10/12 [phone]

## 2012-11-25 ENCOUNTER — Telehealth: Payer: Self-pay | Admitting: Family Medicine

## 2012-11-30 ENCOUNTER — Telehealth: Payer: Self-pay | Admitting: Family Medicine

## 2012-11-30 MED ORDER — DILTIAZEM HCL ER COATED BEADS 300 MG PO CP24: 300.0000 mg | ORAL_CAPSULE | Freq: Every day | ORAL | Status: DC

## 2012-11-30 NOTE — Telephone Encounter (Signed)
Diltiazem 300mg  take one capsule dialy last refill 06/29/2012 #90

## 2012-11-30 NOTE — Telephone Encounter (Signed)
Rx Refilled  

## 2012-12-04 NOTE — Telephone Encounter (Signed)
Still waiting for apprval

## 2012-12-06 ENCOUNTER — Telehealth: Payer: Self-pay | Admitting: Family Medicine

## 2012-12-06 MED ORDER — ALLOPURINOL 300 MG PO TABS: 300.0000 mg | ORAL_TABLET | Freq: Every day | ORAL | Status: DC

## 2012-12-06 NOTE — Telephone Encounter (Signed)
Wants something else for gout..states her insurance company will not cover uloric has went through several times.

## 2012-12-06 NOTE — Telephone Encounter (Signed)
Med rf and pt aware

## 2012-12-06 NOTE — Telephone Encounter (Signed)
Med rf °

## 2012-12-06 NOTE — Telephone Encounter (Signed)
Stop uloric.  Call out allopurinol 200 mg poqday.  Needs to recheck uric acid level in 6 weeks to make sure it is below 6.

## 2012-12-18 ENCOUNTER — Telehealth: Payer: Self-pay | Admitting: Family Medicine

## 2012-12-18 DIAGNOSIS — M109 Gout, unspecified: Secondary | ICD-10-CM

## 2012-12-18 MED ORDER — ALLOPURINOL 300 MG PO TABS: 300.0000 mg | ORAL_TABLET | Freq: Every day | ORAL | Status: DC

## 2012-12-18 NOTE — Telephone Encounter (Signed)
..  Rx Refilled and order for Uric Acid done

## 2012-12-18 NOTE — Telephone Encounter (Signed)
Switch to allopurinol 200 mg poqday and recheck uric acid in 6 weeks.

## 2013-01-23 ENCOUNTER — Other Ambulatory Visit: Payer: Self-pay

## 2013-01-23 MED ORDER — PRAVASTATIN SODIUM 80 MG PO TABS: ORAL_TABLET | ORAL | Status: DC

## 2013-02-02 ENCOUNTER — Telehealth: Payer: Self-pay | Admitting: Family Medicine

## 2013-02-02 MED ORDER — INSULIN ASPART 100 UNIT/ML ~~LOC~~ SOLN: 6.0000 [IU] | Freq: Three times a day (TID) | SUBCUTANEOUS | Status: DC

## 2013-02-02 NOTE — Telephone Encounter (Signed)
Rx Refilled  

## 2013-02-10 LAB — HM DIABETES EYE EXAM

## 2013-03-07 ENCOUNTER — Telehealth: Payer: Self-pay | Admitting: Family Medicine

## 2013-03-07 MED ORDER — LEVOTHYROXINE SODIUM 50 MCG PO TABS: 50.0000 ug | ORAL_TABLET | Freq: Every day | ORAL | Status: DC

## 2013-03-07 NOTE — Telephone Encounter (Signed)
Levothyroxine 50 mcg tab 1 QD #90

## 2013-03-07 NOTE — Telephone Encounter (Signed)
.  Rx Refilled and letter mailed to pt that she need labs and ov

## 2013-03-27 ENCOUNTER — Encounter: Payer: Self-pay | Admitting: Family Medicine

## 2013-03-27 ENCOUNTER — Ambulatory Visit (INDEPENDENT_AMBULATORY_CARE_PROVIDER_SITE_OTHER): Payer: Medicare Other | Admitting: Family Medicine

## 2013-03-27 VITALS — BP 130/72 | HR 68 | Temp 98.1°F | Resp 16 | Ht 63.0 in | Wt 249.0 lb

## 2013-03-27 DIAGNOSIS — E1165 Type 2 diabetes mellitus with hyperglycemia: Secondary | ICD-10-CM

## 2013-03-27 DIAGNOSIS — E785 Hyperlipidemia, unspecified: Secondary | ICD-10-CM

## 2013-03-27 DIAGNOSIS — Z23 Encounter for immunization: Secondary | ICD-10-CM

## 2013-03-27 DIAGNOSIS — I10 Essential (primary) hypertension: Secondary | ICD-10-CM

## 2013-03-27 LAB — COMPLETE METABOLIC PANEL WITH GFR
Albumin: 3.8 g/dL (ref 3.5–5.2)
CO2: 24 mEq/L (ref 19–32)
Calcium: 9.4 mg/dL (ref 8.4–10.5)
Creat: 2.93 mg/dL — ABNORMAL HIGH (ref 0.50–1.10)
GFR, Est African American: 18 mL/min — ABNORMAL LOW
GFR, Est Non African American: 16 mL/min — ABNORMAL LOW
Glucose, Bld: 79 mg/dL (ref 70–99)
Potassium: 5.6 mEq/L — ABNORMAL HIGH (ref 3.5–5.3)
Sodium: 135 mEq/L (ref 135–145)
Total Protein: 7.3 g/dL (ref 6.0–8.3)

## 2013-03-27 LAB — LIPID PANEL
LDL Cholesterol: 59 mg/dL (ref 0–99)
VLDL: 23 mg/dL (ref 0–40)

## 2013-03-27 LAB — HEMOGLOBIN A1C: Mean Plasma Glucose: 163 mg/dL — ABNORMAL HIGH (ref <117)

## 2013-03-27 MED ORDER — ZOSTER VACCINE LIVE 19400 UNT/0.65ML ~~LOC~~ SOLR: 0.6500 mL | Freq: Once | SUBCUTANEOUS | Status: DC

## 2013-03-27 NOTE — Progress Notes (Signed)
Subjective:    Patient ID: Emily Hughes, female    DOB: 07-29-1940, 72 y.o.   MRN: [id number]  HPI Patient is a very pleasant 72 year old African American female with a history of coronary artery disease, chronic kidney disease, and insulin-dependent diabetes mellitus. She is currently taking Lantus 38 units subcutaneous daily. She is also taking NovoLog 10 units prior to meals. She also uses a sliding scale adjustment with NovoLog prior to meals. She states that her fasting blood sugars are less than 130. She states that her 2 hour postprandial sugars are less than 160. She denies any hypoglycemia.  She does complain of some soreness on the plantar aspects of both feet. She denies any dysesthesias or neuropathy. Her blood pressure today is well controlled. She denies any chest pain or shortness of breath or dyspnea on exertion. She is current taking pravastatin 80 mg by mouth daily for hyperlipidemia. She denies any myalgias or right upper quadrant pain. She sees the nephrologist every 3 months for lab work. She is due for her flu shot. She continues to take aspirin 81 mg a day. Her medicine list is reviewed in its entirety. Past Medical History  Diagnosis Date  . High cholesterol   . Diabetes mellitus   . Hypertension   . Anemia   . CAD (coronary artery disease)   . Pancreatitis   . Colon polyps   . Proteinuria   . DDD (degenerative disc disease)   . CRI (chronic renal insufficiency)   . GERD (gastroesophageal reflux disease)    Past Surgical History  Procedure Laterality Date  . Back surgery    . Ankle surgery    . Tonsillectomy    . Angioplasty     Current Outpatient Prescriptions on File Prior to Visit  Medication Sig Dispense Refill  . allopurinol (ZYLOPRIM) 300 MG tablet Take 1 tablet (300 mg total) by mouth daily.  30 tablet  1  . aspirin 81 MG tablet Take 81 mg by mouth daily.        . cloNIDine (CATAPRES) 0.1 MG tablet Take 0.1 mg by mouth 2 (two) times daily.        Marland Kitchen  diltiazem (CARDIZEM CD) 300 MG 24 hr capsule Take 1 capsule (300 mg total) by mouth daily.  30 capsule  3  . ferrous fumarate (FERRO-SEQUELS) 50 MG CR tablet Take 50 mg by mouth daily.        . furosemide (LASIX) 40 MG tablet Take 40 mg by mouth 2 (two) times daily.        Marland Kitchen glimepiride (AMARYL) 4 MG tablet Take 1 tablet (4 mg total) by mouth daily before breakfast.  30 tablet  5  . insulin glargine (LANTUS) 100 UNIT/ML injection Inject 38 Units into the skin at bedtime.       Marland Kitchen levothyroxine (SYNTHROID, LEVOTHROID) 50 MCG tablet Take 1 tablet (50 mcg total) by mouth daily.  90 tablet  0  . metoprolol tartrate (LOPRESSOR) 25 MG tablet Take 25 mg by mouth daily.        Marland Kitchen omeprazole (PRILOSEC) 20 MG capsule Take 20 mg by mouth daily.        . pravastatin (PRAVACHOL) 80 MG tablet TAKE ONE TABLET AT BEDTIME.  30 tablet  6  . valsartan (DIOVAN) 160 MG tablet Take 160 mg by mouth daily.         No current facility-administered medications on file prior to visit.   Allergies  Allergen Reactions  . Ace Inhibitors   .  Actos [Pioglitazone Hydrochloride]   . Dust Mite Extract   . Dye Fdc Red [Erythrosine Red No. 3]    History   Social History  . Marital Status: Widowed    Spouse Name: N/A    Number of Children: N/A  . Years of Education: N/A   Occupational History  . Not on file.   Social History Main Topics  . Smoking status: Former Research scientist (life sciences)  . Smokeless tobacco: Not on file  . Alcohol Use: No  . Drug Use: No  . Sexual Activity: Not on file   Other Topics Concern  . Not on file   Social History Narrative  . No narrative on file     Review of Systems  All other systems reviewed and are negative.       Objective:   Physical Exam  Constitutional: She is oriented to person, place, and time. She appears well-developed and well-nourished. No distress.  Neck: Neck supple. No JVD present. No thyromegaly present.  Cardiovascular: Normal rate, regular rhythm and intact distal  pulses.  Exam reveals no gallop and no friction rub.   Murmur heard. Pulmonary/Chest: Effort normal and breath sounds normal. No respiratory distress. She has no wheezes. She has no rales.  Abdominal: Soft. Bowel sounds are normal. She exhibits no distension and no mass. There is no tenderness. There is no rebound and no guarding.  Musculoskeletal: She exhibits no edema.  Lymphadenopathy:    She has no cervical adenopathy.  Neurological: She is alert and oriented to person, place, and time. She has normal reflexes. No cranial nerve deficit.  Skin: Skin is warm. No rash noted. She is not diaphoretic. No erythema. No pallor.          Assessment & Plan:  1. Type II or unspecified type diabetes mellitus with unspecified complication, uncontrolled  check hemoglobin A1c. The goal hemoglobin A1c is less than 6.5. I recommended an annual eye exam. Also gave the patient a flu shot. Diabetic foot exam is normal.  - COMPLETE METABOLIC PANEL WITH GFR - Hemoglobin A1c - Lipid panel  2. Need for prophylactic vaccination and inoculation against influenza - Flu Vaccine QUAD 36+ mos IM  3. HTN (hypertension) Blood pressure is well controlled. Continue current medications at the present dosages.   4. HLD (hyp.erlipidemia) Check fasting lipid panel. Goal LDL is less than 70

## 2013-04-25 ENCOUNTER — Other Ambulatory Visit (HOSPITAL_COMMUNITY): Payer: Self-pay

## 2013-04-25 ENCOUNTER — Encounter (HOSPITAL_COMMUNITY): Payer: Medicare Other

## 2013-04-26 ENCOUNTER — Encounter (HOSPITAL_COMMUNITY)
Admission: RE | Admit: 2013-04-26 | Discharge: 2013-04-26 | Disposition: A | Discharge status: Home / Self Care | Payer: Medicare Other | Source: Ambulatory Visit | Attending: Nephrology | Admitting: Nephrology

## 2013-04-26 DIAGNOSIS — D509 Iron deficiency anemia, unspecified: Secondary | ICD-10-CM | POA: Insufficient documentation

## 2013-04-26 LAB — POCT HEMOGLOBIN-HEMACUE: Hemoglobin: 9.8 g/dL — ABNORMAL LOW (ref 12.0–15.0)

## 2013-04-26 MED ORDER — SODIUM CHLORIDE 0.9 % IV SOLN
1020.0000 mg | Freq: Once | INTRAVENOUS | Status: AC
  Administered 2013-04-26: 09:00:00 1020 mg via INTRAVENOUS
  Filled 2013-04-26: qty 34

## 2013-04-26 MED ORDER — DARBEPOETIN ALFA-POLYSORBATE 200 MCG/0.4ML IJ SOLN
INTRAMUSCULAR | Status: AC
  Administered 2013-04-26: 200 ug via SUBCUTANEOUS
  Filled 2013-04-26: qty 0.4

## 2013-04-26 MED ORDER — DARBEPOETIN ALFA-POLYSORBATE 200 MCG/0.4ML IJ SOLN
200.0000 ug | Freq: Once | INTRAMUSCULAR | Status: AC
  Administered 2013-04-26: 200 ug via SUBCUTANEOUS

## 2013-05-27 ENCOUNTER — Other Ambulatory Visit: Payer: Self-pay | Admitting: Family Medicine

## 2013-05-27 MED ORDER — GLIMEPIRIDE 4 MG PO TABS: 4.0000 mg | ORAL_TABLET | Freq: Every day | ORAL | Status: DC

## 2013-05-27 NOTE — Telephone Encounter (Signed)
Rx Refilled  

## 2013-06-07 ENCOUNTER — Emergency Department (HOSPITAL_COMMUNITY)
Admission: EM | Admit: 2013-06-07 | Discharge: 2013-06-07 | Disposition: A | Discharge status: Home / Self Care | Payer: Medicare Other | Attending: Emergency Medicine | Admitting: Emergency Medicine

## 2013-06-07 ENCOUNTER — Encounter (HOSPITAL_COMMUNITY): Payer: Self-pay | Admitting: Emergency Medicine

## 2013-06-07 DIAGNOSIS — Z8739 Personal history of other diseases of the musculoskeletal system and connective tissue: Secondary | ICD-10-CM | POA: Insufficient documentation

## 2013-06-07 DIAGNOSIS — E119 Type 2 diabetes mellitus without complications: Secondary | ICD-10-CM | POA: Insufficient documentation

## 2013-06-07 DIAGNOSIS — N189 Chronic kidney disease, unspecified: Secondary | ICD-10-CM | POA: Insufficient documentation

## 2013-06-07 DIAGNOSIS — K219 Gastro-esophageal reflux disease without esophagitis: Secondary | ICD-10-CM | POA: Insufficient documentation

## 2013-06-07 DIAGNOSIS — I129 Hypertensive chronic kidney disease with stage 1 through stage 4 chronic kidney disease, or unspecified chronic kidney disease: Secondary | ICD-10-CM | POA: Insufficient documentation

## 2013-06-07 DIAGNOSIS — Z7982 Long term (current) use of aspirin: Secondary | ICD-10-CM | POA: Insufficient documentation

## 2013-06-07 DIAGNOSIS — Z860100 Personal history of colon polyps, unspecified: Secondary | ICD-10-CM | POA: Insufficient documentation

## 2013-06-07 DIAGNOSIS — Z794 Long term (current) use of insulin: Secondary | ICD-10-CM | POA: Insufficient documentation

## 2013-06-07 DIAGNOSIS — Z79899 Other long term (current) drug therapy: Secondary | ICD-10-CM | POA: Insufficient documentation

## 2013-06-07 DIAGNOSIS — E7800 Pure hypercholesterolemia, unspecified: Secondary | ICD-10-CM | POA: Insufficient documentation

## 2013-06-07 DIAGNOSIS — Z87891 Personal history of nicotine dependence: Secondary | ICD-10-CM | POA: Insufficient documentation

## 2013-06-07 DIAGNOSIS — M7912 Myalgia of auxiliary muscles, head and neck: Secondary | ICD-10-CM

## 2013-06-07 DIAGNOSIS — M542 Cervicalgia: Secondary | ICD-10-CM | POA: Insufficient documentation

## 2013-06-07 DIAGNOSIS — D649 Anemia, unspecified: Secondary | ICD-10-CM | POA: Insufficient documentation

## 2013-06-07 DIAGNOSIS — I2510 Atherosclerotic heart disease of native coronary artery without angina pectoris: Secondary | ICD-10-CM | POA: Insufficient documentation

## 2013-06-07 MED ORDER — CYCLOBENZAPRINE HCL 10 MG PO TABS
5.0000 mg | ORAL_TABLET | Freq: Once | ORAL | Status: AC
  Administered 2013-06-07: 5 mg via ORAL
  Filled 2013-06-07: qty 1

## 2013-06-07 MED ORDER — CYCLOBENZAPRINE HCL 10 MG PO TABS: 10.0000 mg | ORAL_TABLET | Freq: Two times a day (BID) | ORAL | Status: DC | PRN

## 2013-06-07 MED ORDER — CYCLOBENZAPRINE HCL 10 MG PO TABS: 5.0000 mg | ORAL_TABLET | Freq: Two times a day (BID) | ORAL | Status: DC | PRN

## 2013-06-07 NOTE — ED Notes (Signed)
Pt alert, nad, presents from home, c/o neck pain, onset several days ago, denies trauma or injury, ambulates to room, PMS intact, resp even unlabored, skin pwd

## 2013-06-07 NOTE — ED Provider Notes (Signed)
CSN: [id number]     Arrival date & time 06/07/13  1012 History   First MD Initiated Contact with Patient 06/07/13 1032     Chief Complaint  Patient presents with  . Neck Pain   (Consider location/radiation/quality/duration/timing/severity/associated sxs/prior Treatment) HPI Comments: Pt is a 72 y.o. female with Pmhx as above who presents with several days of pain/spasm of BL sides of neck, R greater than left. No recent illness, injury, fever. She states she has neck pain w/ swallowing. Pain worse when turning head & palpating SCM muscles BL.   Patient is a 72 y.o. female presenting with neck injury.  Neck Injury This is a new problem. The current episode started more than 2 days ago. The problem occurs constantly. The problem has not changed since onset.Pertinent negatives include no chest pain, no abdominal pain, no headaches and no shortness of breath. Exacerbated by: turning head. Nothing relieves the symptoms. She has tried a warm compress for the symptoms. The treatment provided mild relief.    Past Medical History  Diagnosis Date  . High cholesterol   . Diabetes mellitus   . Hypertension   . Anemia   . CAD (coronary artery disease)   . Pancreatitis   . Colon polyps   . Proteinuria   . DDD (degenerative disc disease)   . CRI (chronic renal insufficiency)   . GERD (gastroesophageal reflux disease)    Past Surgical History  Procedure Laterality Date  . Back surgery    . Ankle surgery    . Tonsillectomy    . Angioplasty     No family history on file. History  Substance Use Topics  . Smoking status: Former Research scientist (life sciences)  . Smokeless tobacco: Not on file  . Alcohol Use: No   OB History   Grav Para Term Preterm Abortions TAB SAB Ect Mult Living                 Review of Systems  Constitutional: Negative for fever, chills, diaphoresis, activity change, appetite change and fatigue.  HENT: Negative for congestion, facial swelling, rhinorrhea and sore throat.   Eyes: Negative  for photophobia and discharge.  Respiratory: Negative for cough, chest tightness and shortness of breath.   Cardiovascular: Negative for chest pain, palpitations and leg swelling.  Gastrointestinal: Negative for nausea, vomiting, abdominal pain and diarrhea.  Endocrine: Negative for polydipsia and polyuria.  Genitourinary: Negative for dysuria, frequency, difficulty urinating and pelvic pain.  Musculoskeletal: Negative for arthralgias, back pain, neck pain and neck stiffness.  Skin: Negative for color change and wound.  Allergic/Immunologic: Negative for immunocompromised state.  Neurological: Negative for facial asymmetry, weakness, numbness and headaches.  Hematological: Does not bruise/bleed easily.  Psychiatric/Behavioral: Negative for confusion and agitation.    Allergies  Ace inhibitors; Actos; Dust mite extract; and Dye fdc red  Home Medications   Current Outpatient Rx  Name  Route  Sig  Dispense  Refill  . allopurinol (ZYLOPRIM) 300 MG tablet   Oral   Take 1 tablet (300 mg total) by mouth daily.   30 tablet   1   . aspirin 81 MG tablet   Oral   Take 81 mg by mouth daily.           . cloNIDine (CATAPRES) 0.1 MG tablet   Oral   Take 0.1 mg by mouth 2 (two) times daily.           . cyclobenzaprine (FLEXERIL) 10 MG tablet   Oral   Take 0.5  tablets (5 mg total) by mouth 2 (two) times daily as needed for muscle spasms.   5 tablet   0   . diltiazem (CARDIZEM CD) 300 MG 24 hr capsule   Oral   Take 1 capsule (300 mg total) by mouth daily.   30 capsule   3   . ferrous fumarate (FERRO-SEQUELS) 50 MG CR tablet   Oral   Take 50 mg by mouth daily.           . furosemide (LASIX) 40 MG tablet   Oral   Take 40 mg by mouth 2 (two) times daily.           Marland Kitchen glimepiride (AMARYL) 4 MG tablet   Oral   Take 1 tablet (4 mg total) by mouth daily before breakfast.   30 tablet   5   . insulin aspart (NOVOLOG) 100 UNIT/ML injection   Subcutaneous   Inject 10 Units  into the skin 3 (three) times daily before meals.         . insulin glargine (LANTUS) 100 UNIT/ML injection   Subcutaneous   Inject 38 Units into the skin at bedtime.          Marland Kitchen levothyroxine (SYNTHROID, LEVOTHROID) 50 MCG tablet   Oral   Take 1 tablet (50 mcg total) by mouth daily.   90 tablet   0   . metoprolol tartrate (LOPRESSOR) 25 MG tablet   Oral   Take 25 mg by mouth daily.           Marland Kitchen omeprazole (PRILOSEC) 20 MG capsule   Oral   Take 20 mg by mouth daily.           . pravastatin (PRAVACHOL) 80 MG tablet      TAKE ONE TABLET AT BEDTIME.   30 tablet   6   . valsartan (DIOVAN) 160 MG tablet   Oral   Take 160 mg by mouth daily.           Marland Kitchen zoster vaccine live, PF, (ZOSTAVAX) 03474 UNT/0.65ML injection   Subcutaneous   Inject 19,400 Units into the skin once.   1 each   0    BP 162/85  Pulse 90  Temp(Src) 98 F (36.7 C) (Oral)  Resp 16  Wt 250 lb (113.399 kg)  SpO2 98% Physical Exam  Constitutional: She is oriented to person, place, and time. She appears well-developed and well-nourished. No distress.  HENT:  Head: Normocephalic and atraumatic.  Mouth/Throat: No oropharyngeal exudate, posterior oropharyngeal edema or posterior oropharyngeal erythema.  Eyes: Pupils are equal, round, and reactive to light.  Neck: Normal range of motion. Neck supple. Muscular tenderness present. No spinous process tenderness present. No erythema present.    Cardiovascular: Normal rate, regular rhythm and normal heart sounds.  Exam reveals no gallop and no friction rub.   No murmur heard. Pulmonary/Chest: Effort normal and breath sounds normal. No respiratory distress. She has no wheezes. She has no rales.  Abdominal: Soft. Bowel sounds are normal. She exhibits no distension and no mass. There is no tenderness. There is no rebound and no guarding.  Musculoskeletal: Normal range of motion. She exhibits no edema and no tenderness.  Neurological: She is alert and  oriented to person, place, and time.  Skin: Skin is warm and dry.  Psychiatric: She has a normal mood and affect.    ED Course  Procedures (including critical care time) Labs Review Labs Reviewed - No data to display Imaging Review  No results found.  EKG Interpretation   None       MDM   1. Sternocleidomastoid muscle tenderness    Pt is a 72 y.o. female with Pmhx as above who presents with several days of pain/spasm of BL sides of neck, R greater than left. No recent illness, injury, fever. She states she has neck pain w/ swallowing.  +ttp and tightness over R SCM muscle.  No posterior neck pain.  She is well appearing, afebrile.  HEENT exam nml.  Doubt meningitis, fracture, PTA/RPA and feel pain is due to muscular spasm.  Will d/c home w/ flexeril and OTC pain meds.  Return precautions given for new or worsening symptoms including fever, numbness, weakness. Trouble swallowing.          Neta Ehlers, MD 06/07/13 [phone]

## 2013-06-12 ENCOUNTER — Other Ambulatory Visit: Payer: Self-pay | Admitting: Family Medicine

## 2013-06-12 MED ORDER — ALLOPURINOL 300 MG PO TABS: 300.0000 mg | ORAL_TABLET | Freq: Every day | ORAL | Status: DC

## 2013-06-12 MED ORDER — LEVOTHYROXINE SODIUM 50 MCG PO TABS: 50.0000 ug | ORAL_TABLET | Freq: Every day | ORAL | Status: DC

## 2013-06-12 NOTE — Telephone Encounter (Signed)
Rx Refilled  

## 2013-06-17 ENCOUNTER — Other Ambulatory Visit: Payer: Self-pay | Admitting: Family Medicine

## 2013-06-17 MED ORDER — FUROSEMIDE 40 MG PO TABS: ORAL_TABLET | ORAL | Status: DC

## 2013-06-17 NOTE — Telephone Encounter (Signed)
Rx Refilled  

## 2013-07-02 ENCOUNTER — Encounter: Payer: Self-pay | Admitting: Family Medicine

## 2013-07-02 ENCOUNTER — Other Ambulatory Visit: Payer: Self-pay | Admitting: Family Medicine

## 2013-07-02 ENCOUNTER — Ambulatory Visit
Admission: RE | Admit: 2013-07-02 | Discharge: 2013-07-02 | Disposition: A | Discharge status: Home / Self Care | Payer: Medicare Other | Source: Ambulatory Visit | Attending: Family Medicine | Admitting: Family Medicine

## 2013-07-02 ENCOUNTER — Ambulatory Visit (INDEPENDENT_AMBULATORY_CARE_PROVIDER_SITE_OTHER): Payer: Medicare Other | Admitting: Family Medicine

## 2013-07-02 VITALS — BP 120/68 | HR 72 | Temp 97.4°F | Resp 18 | Ht 63.0 in | Wt 252.0 lb

## 2013-07-02 DIAGNOSIS — M79671 Pain in right foot: Secondary | ICD-10-CM

## 2013-07-02 DIAGNOSIS — M79609 Pain in unspecified limb: Secondary | ICD-10-CM

## 2013-07-02 MED ORDER — PREDNISONE 20 MG PO TABS: ORAL_TABLET | ORAL | Status: DC

## 2013-07-02 NOTE — Progress Notes (Signed)
Subjective:    Patient ID: Emily Hughes, female    DOB: 1941-04-15, 72 y.o.   MRN: [id number]  HPI  Patient presents with 2 weeks of pain in her right foot. It is located over the fifth metatarsal head. She denies any injury. The pain began suddenly without explanation. The area is swollen and extremely tender to the touch. She does not want me to even touch the skin. There is no erythema or warmth. There is no evidence of cellulitis. There is no skin breakdown or ulcer. There is no palpable bony abnormality. The patient does have a history of gout. Past Medical History  Diagnosis Date  . High cholesterol   . Diabetes mellitus   . Hypertension   . Anemia   . CAD (coronary artery disease)   . Pancreatitis   . Colon polyps   . Proteinuria   . DDD (degenerative disc disease)   . CRI (chronic renal insufficiency)   . GERD (gastroesophageal reflux disease)    Current Outpatient Prescriptions on File Prior to Visit  Medication Sig Dispense Refill  . allopurinol (ZYLOPRIM) 300 MG tablet Take 1 tablet (300 mg total) by mouth daily.  90 tablet  1  . aspirin 81 MG tablet Take 81 mg by mouth daily.        . cloNIDine (CATAPRES) 0.1 MG tablet Take 0.1 mg by mouth 2 (two) times daily.        . cyclobenzaprine (FLEXERIL) 10 MG tablet Take 0.5 tablets (5 mg total) by mouth 2 (two) times daily as needed for muscle spasms.  5 tablet  0  . diltiazem (CARDIZEM CD) 300 MG 24 hr capsule Take 1 capsule (300 mg total) by mouth daily.  30 capsule  3  . ferrous fumarate (FERRO-SEQUELS) 50 MG CR tablet Take 50 mg by mouth daily.        . furosemide (LASIX) 40 MG tablet 2 tab po qam and 1 tab po qpm  90 tablet  3  . glimepiride (AMARYL) 4 MG tablet Take 1 tablet (4 mg total) by mouth daily before breakfast.  30 tablet  5  . insulin aspart (NOVOLOG) 100 UNIT/ML injection Inject 10 Units into the skin 3 (three) times daily before meals.      . insulin glargine (LANTUS) 100 UNIT/ML injection Inject 38 Units  into the skin at bedtime.       Marland Kitchen levothyroxine (SYNTHROID, LEVOTHROID) 50 MCG tablet Take 1 tablet (50 mcg total) by mouth daily.  90 tablet  1  . metoprolol tartrate (LOPRESSOR) 25 MG tablet Take 25 mg by mouth daily.        Marland Kitchen omeprazole (PRILOSEC) 20 MG capsule Take 20 mg by mouth daily.        . pravastatin (PRAVACHOL) 80 MG tablet TAKE ONE TABLET AT BEDTIME.  30 tablet  6  . valsartan (DIOVAN) 160 MG tablet Take 160 mg by mouth daily.        Marland Kitchen zoster vaccine live, PF, (ZOSTAVAX) 36644 UNT/0.65ML injection Inject 19,400 Units into the skin once.  1 each  0   No current facility-administered medications on file prior to visit.   Allergies  Allergen Reactions  . Ace Inhibitors   . Actos [Pioglitazone Hydrochloride]   . Dust Mite Extract   . Dye Fdc Red [Erythrosine Red No. 3]    History   Social History  . Marital Status: Widowed    Spouse Name: N/A    Number of Children: N/A  .  Years of Education: N/A   Occupational History  . Not on file.   Social History Main Topics  . Smoking status: Former Research scientist (life sciences)  . Smokeless tobacco: Not on file  . Alcohol Use: No  . Drug Use: No  . Sexual Activity: Not on file   Other Topics Concern  . Not on file   Social History Narrative  . No narrative on file      Review of Systems  All other systems reviewed and are negative.       Objective:   Physical Exam  Vitals reviewed. Musculoskeletal:       Right foot: She exhibits tenderness, bony tenderness and swelling. She exhibits normal range of motion, no deformity and no laceration.          Assessment & Plan:  1. Right foot pain I suspect that this is a gout flare.  However given the location I will obtain an x-ray of the right foot to rule out a Jones fracture.  If the x-ray is negative consultation a prednisone Dosepak. - DG Foot Complete Right; Future

## 2013-07-06 ENCOUNTER — Telehealth: Payer: Self-pay | Admitting: Family Medicine

## 2013-07-06 MED ORDER — DILTIAZEM HCL ER COATED BEADS 300 MG PO CP24: 300.0000 mg | ORAL_CAPSULE | Freq: Every day | ORAL | Status: DC

## 2013-07-06 MED ORDER — METOPROLOL SUCCINATE ER 25 MG PO TB24: 25.0000 mg | ORAL_TABLET | Freq: Every day | ORAL | Status: DC

## 2013-07-06 NOTE — Telephone Encounter (Signed)
Med ordered and correction made to medication list

## 2013-07-06 NOTE — Telephone Encounter (Signed)
I want her on metoprolol succinate 25 mg poqday and please correct med list.  Thanks.

## 2013-07-06 NOTE — Telephone Encounter (Signed)
Pharmacy asking for refill of Metoprolol Succ ER 25 mg.  Metoprolol Tartrate 25 mg on med list??

## 2013-08-13 ENCOUNTER — Other Ambulatory Visit: Payer: Self-pay | Admitting: Family Medicine

## 2013-09-07 ENCOUNTER — Other Ambulatory Visit: Payer: Self-pay | Admitting: Family Medicine

## 2013-09-07 MED ORDER — FUROSEMIDE 40 MG PO TABS: ORAL_TABLET | ORAL | Status: DC

## 2013-09-07 NOTE — Telephone Encounter (Signed)
Rx Refilled  

## 2013-09-13 ENCOUNTER — Other Ambulatory Visit: Payer: Self-pay | Admitting: Family Medicine

## 2013-10-03 ENCOUNTER — Encounter: Payer: Self-pay | Admitting: Family Medicine

## 2013-10-10 ENCOUNTER — Other Ambulatory Visit: Payer: Self-pay | Admitting: Family Medicine

## 2013-10-10 MED ORDER — DILTIAZEM HCL ER COATED BEADS 300 MG PO CP24: 300.0000 mg | ORAL_CAPSULE | Freq: Every day | ORAL | Status: DC

## 2013-10-10 NOTE — Telephone Encounter (Signed)
Rx Refilled  

## 2013-10-16 ENCOUNTER — Other Ambulatory Visit: Payer: Self-pay | Admitting: Family Medicine

## 2013-11-10 ENCOUNTER — Other Ambulatory Visit: Payer: Self-pay | Admitting: Family Medicine

## 2013-12-04 ENCOUNTER — Ambulatory Visit (INDEPENDENT_AMBULATORY_CARE_PROVIDER_SITE_OTHER): Payer: Medicare HMO | Admitting: Family Medicine

## 2013-12-04 ENCOUNTER — Encounter: Payer: Self-pay | Admitting: Family Medicine

## 2013-12-04 VITALS — BP 120/60 | HR 66 | Temp 97.4°F | Resp 18 | Ht 63.0 in | Wt 254.0 lb

## 2013-12-04 DIAGNOSIS — L500 Allergic urticaria: Secondary | ICD-10-CM

## 2013-12-04 MED ORDER — MOMETASONE FUROATE 0.1 % EX OINT: TOPICAL_OINTMENT | Freq: Every day | CUTANEOUS | Status: DC

## 2013-12-04 MED ORDER — CETIRIZINE HCL 10 MG PO TABS: 10.0000 mg | ORAL_TABLET | Freq: Every day | ORAL | Status: DC

## 2013-12-04 NOTE — Progress Notes (Signed)
Subjective:    Patient ID: Emily Hughes, female    DOB: 11-17-1940, 73 y.o.   MRN: [id number]  HPI Patient reports a one-month history of an evanescent rash that appears on the chest and her upper torso or arms. It is characterized by erythematous papules and hives. The papules are approximately 4-5 mm in size. Limited to her chest her upper back and her upper arms. There no lesions on her legs. She denies any new detergents or soaps or perfumes. She denies any new contacts. She denies any viral symptoms. She denies any plant exposure. Furthermore this is been going on for a month and it comes and goes. She cannot pinpoint any allergic trigger. Past Medical History  Diagnosis Date  . High cholesterol   . Diabetes mellitus   . Hypertension   . Anemia   . CAD (coronary artery disease)   . Pancreatitis   . Colon polyps   . Proteinuria   . DDD (degenerative disc disease)   . CRI (chronic renal insufficiency)   . GERD (gastroesophageal reflux disease)    Current Outpatient Prescriptions on File Prior to Visit  Medication Sig Dispense Refill  . allopurinol (ZYLOPRIM) 300 MG tablet Take 1 tablet (300 mg total) by mouth daily.  90 tablet  1  . aspirin 81 MG tablet Take 81 mg by mouth daily.        . cloNIDine (CATAPRES) 0.1 MG tablet Take 0.1 mg by mouth 2 (two) times daily.        Marland Kitchen diltiazem (CARDIZEM CD) 300 MG 24 hr capsule Take 1 capsule (300 mg total) by mouth daily.  90 capsule  1  . ferrous fumarate (FERRO-SEQUELS) 50 MG CR tablet Take 50 mg by mouth daily.        . furosemide (LASIX) 40 MG tablet 2 tab po qam and 1 tab po qpm  270 tablet  1  . glimepiride (AMARYL) 4 MG tablet Take 1 tablet (4 mg total) by mouth daily before breakfast.  30 tablet  5  . LANTUS 100 UNIT/ML injection INJECT 38 UNITS ONCE A DAY IN THE MORNING.  10 mL  5  . levothyroxine (SYNTHROID, LEVOTHROID) 50 MCG tablet Take 1 tablet (50 mcg total) by mouth daily.  90 tablet  1  . NOVOLOG 100 UNIT/ML injection  INJECT 6 UNITS AT LUNCH.  10 mL  5  . omeprazole (PRILOSEC) 20 MG capsule Take 20 mg by mouth daily.        . pravastatin (PRAVACHOL) 80 MG tablet TAKE ONE TABLET AT BEDTIME.  90 tablet  1  . valsartan (DIOVAN) 160 MG tablet Take 160 mg by mouth daily.         No current facility-administered medications on file prior to visit.   Allergies  Allergen Reactions  . Ace Inhibitors   . Actos [Pioglitazone Hydrochloride]   . Dust Mite Extract   . Dye Fdc Red [Erythrosine Red No. 3]    History   Social History  . Marital Status: Widowed    Spouse Name: N/A    Number of Children: N/A  . Years of Education: N/A   Occupational History  . Not on file.   Social History Main Topics  . Smoking status: Former Research scientist (life sciences)  . Smokeless tobacco: Not on file  . Alcohol Use: No  . Drug Use: No  . Sexual Activity: Not on file   Other Topics Concern  . Not on file   Social History Narrative  .  No narrative on file      Review of Systems  All other systems reviewed and are negative.      Objective:   Physical Exam  Vitals reviewed. Neck: Neck supple.  Cardiovascular: Normal rate and regular rhythm.   No murmur heard. Pulmonary/Chest: Effort normal and breath sounds normal. No respiratory distress. She has no wheezes. She has no rales.  Abdominal: Soft. Bowel sounds are normal.  Lymphadenopathy:    She has no cervical adenopathy.  Skin: Rash noted. There is erythema.          Assessment & Plan:  1. Allergic urticaria I am not sure of the cause. I believe this isn't something she is ingesting either medication or food. The most likely suspect for medication would be allopurinol.  Discontinue allopurinol and start elocon ointment qd and zyrtec 10 mg poqday and recheck in 1 week if persistent.  Consider a skin biopsy if persistent.   - mometasone (ELOCON) 0.1 % ointment; Apply topically daily.  Dispense: 45 g; Refill: 1 - cetirizine (ZYRTEC) 10 MG tablet; Take 1 tablet (10 mg  total) by mouth daily.  Dispense: 30 tablet; Refill: 11

## 2013-12-12 ENCOUNTER — Other Ambulatory Visit: Payer: Self-pay | Admitting: Family Medicine

## 2013-12-14 ENCOUNTER — Encounter: Payer: Self-pay | Admitting: Family Medicine

## 2014-01-21 ENCOUNTER — Other Ambulatory Visit: Payer: Self-pay | Admitting: Family Medicine

## 2014-01-22 ENCOUNTER — Other Ambulatory Visit: Payer: Self-pay | Admitting: Family Medicine

## 2014-01-22 MED ORDER — INSULIN DETEMIR 100 UNIT/ML ~~LOC~~ SOLN: 38.0000 [IU] | Freq: Every day | SUBCUTANEOUS | Status: DC

## 2014-02-25 ENCOUNTER — Other Ambulatory Visit: Payer: Self-pay | Admitting: Family Medicine

## 2014-03-02 ENCOUNTER — Other Ambulatory Visit: Payer: Self-pay | Admitting: Family Medicine

## 2014-03-02 NOTE — Telephone Encounter (Signed)
Refill appropriate and filled per protocol. 

## 2014-03-27 ENCOUNTER — Other Ambulatory Visit: Payer: Self-pay | Admitting: Family Medicine

## 2014-04-08 ENCOUNTER — Encounter: Payer: Self-pay | Admitting: Family Medicine

## 2014-04-08 ENCOUNTER — Ambulatory Visit (INDEPENDENT_AMBULATORY_CARE_PROVIDER_SITE_OTHER): Payer: Medicare HMO | Admitting: Family Medicine

## 2014-04-08 VITALS — BP 140/70 | HR 68 | Temp 98.2°F | Resp 18 | Ht 63.0 in | Wt 254.0 lb

## 2014-04-08 DIAGNOSIS — Z23 Encounter for immunization: Secondary | ICD-10-CM | POA: Diagnosis not present

## 2014-04-08 DIAGNOSIS — N186 End stage renal disease: Secondary | ICD-10-CM

## 2014-04-08 DIAGNOSIS — E785 Hyperlipidemia, unspecified: Secondary | ICD-10-CM

## 2014-04-08 DIAGNOSIS — E1122 Type 2 diabetes mellitus with diabetic chronic kidney disease: Secondary | ICD-10-CM

## 2014-04-08 DIAGNOSIS — E038 Other specified hypothyroidism: Secondary | ICD-10-CM

## 2014-04-08 LAB — TSH: TSH: 3.283 u[IU]/mL (ref 0.350–4.500)

## 2014-04-08 LAB — MICROALBUMIN, URINE: Microalb, Ur: 1.4 mg/dL (ref <2.0)

## 2014-04-08 LAB — LIPID PANEL
Cholesterol: 129 mg/dL (ref 0–200)
HDL: 35 mg/dL — ABNORMAL LOW (ref >39)
LDL CALC: 75 mg/dL (ref 0–99)
Total CHOL/HDL Ratio: 3.7 Ratio
Triglycerides: 97 mg/dL (ref <150)
VLDL: 19 mg/dL (ref 0–40)

## 2014-04-08 LAB — HEMOGLOBIN A1C
HEMOGLOBIN A1C: 8.5 % — AB (ref <5.7)
Mean Plasma Glucose: 197 mg/dL — ABNORMAL HIGH (ref <117)

## 2014-04-08 MED ORDER — GLUCOSE BLOOD VI STRP: ORAL_STRIP | Status: DC

## 2014-04-08 MED ORDER — ACCU-CHEK AVIVA PLUS W/DEVICE KIT: PACK | Status: DC

## 2014-04-08 MED ORDER — ACCU-CHEK SOFTCLIX LANCETS MISC: Status: DC

## 2014-04-08 NOTE — Progress Notes (Signed)
Subjective:    Patient ID: Emily Hughes, female    DOB: [birth year], 73 y.o.   MRN: [id number]  HPI Patient is here today for followup of her multiple medical problems. TSH 4 chronic kidney disease currently managed by Dr. Mercy Moore. He also has insulin-dependent diabetes mellitus. She is currently on Lantus 38 units daily and NovoLog 6 units with lunch. She states her blood sugars are typically 120s to 130s. He denies the hypoglycemic episodes. Her blood pressure today is adequately controlled 40/70. She denies any chest pain short of breath or dyspnea on exertion. She also has hypothyroidism and she is overdue to recheck a TSH. She also has hyperlipidemia. She denies any myalgias or right upper quadrant pain on pravastatin. She is overdue for her fasting lipid panel. The patient is also due for a flu shot as well as Prevnar 13. Past Medical History  Diagnosis Date  . High cholesterol   . Diabetes mellitus   . Hypertension   . Anemia   . CAD (coronary artery disease)   . Pancreatitis   . Colon polyps   . Proteinuria   . DDD (degenerative disc disease)   . CRI (chronic renal insufficiency)   . GERD (gastroesophageal reflux disease)    Past Surgical History  Procedure Laterality Date  . Back surgery    . Ankle surgery    . Tonsillectomy    . Angioplasty     Current Outpatient Prescriptions on File Prior to Visit  Medication Sig Dispense Refill  . allopurinol (ZYLOPRIM) 300 MG tablet Take 1 tablet (300 mg total) by mouth daily.  90 tablet  1  . aspirin 81 MG tablet Take 81 mg by mouth daily.        . calcitRIOL (ROCALTROL) 0.25 MCG capsule Take 0.25 mcg by mouth daily.      . cetirizine (ZYRTEC) 10 MG tablet Take 1 tablet (10 mg total) by mouth daily.  30 tablet  11  . cloNIDine (CATAPRES) 0.1 MG tablet Take 0.1 mg by mouth 2 (two) times daily.        Marland Kitchen diltiazem (CARDIZEM CD) 300 MG 24 hr capsule Take 1 capsule (300 mg total) by mouth daily.  90 capsule  1  . ferrous fumarate  (FERRO-SEQUELS) 50 MG CR tablet Take 50 mg by mouth daily.        . furosemide (LASIX) 40 MG tablet TAKE 2 TABLETS IN THE MORNING AND 1 TABLET IN THE EVENING.  270 tablet  3  . glimepiride (AMARYL) 4 MG tablet TAKE 1 TABLET ONCE DAILY.  30 tablet  0  . insulin detemir (LEVEMIR) 100 UNIT/ML injection Inject 0.38 mLs (38 Units total) into the skin daily.  10 mL  3  . LANTUS 100 UNIT/ML injection INJECT 38 UNITS ONCE A DAY IN THE MORNING.  10 mL  5  . levothyroxine (SYNTHROID, LEVOTHROID) 50 MCG tablet TAKE 1 TABLET ONCE DAILY.  90 tablet  0  . metoprolol succinate (TOPROL-XL) 25 MG 24 hr tablet TAKE (1) TABLET DAILY.  90 tablet  0  . mometasone (ELOCON) 0.1 % ointment Apply topically daily.  45 g  1  . NOVOLOG 100 UNIT/ML injection INJECT 6 UNITS AT LUNCH.  10 mL  5  . omeprazole (PRILOSEC) 20 MG capsule Take 20 mg by mouth daily.        . pravastatin (PRAVACHOL) 80 MG tablet TAKE ONE TABLET AT BEDTIME.  90 tablet  1  . valsartan (DIOVAN) 160 MG tablet Take  160 mg by mouth daily.         No current facility-administered medications on file prior to visit.   Allergies  Allergen Reactions  . Ace Inhibitors   . Actos [Pioglitazone Hydrochloride]   . Dust Mite Extract   . Dye Fdc Red [Erythrosine Red No. 3]    History   Social History  . Marital Status: Widowed    Spouse Name: N/A    Number of Children: N/A  . Years of Education: N/A   Occupational History  . Not on file.   Social History Main Topics  . Smoking status: Former Research scientist (life sciences)  . Smokeless tobacco: Not on file  . Alcohol Use: No  . Drug Use: No  . Sexual Activity: Not on file   Other Topics Concern  . Not on file   Social History Narrative  . No narrative on file      Review of Systems  All other systems reviewed and are negative.      Objective:   Physical Exam  Vitals reviewed. Constitutional: She appears well-developed and well-nourished.  Neck: Neck supple. No JVD present. No thyromegaly present.    Cardiovascular: Normal rate, regular rhythm, normal heart sounds and intact distal pulses.   No murmur heard. Pulmonary/Chest: Effort normal and breath sounds normal. No respiratory distress. She has no wheezes. She has no rales.  Abdominal: Soft. Bowel sounds are normal. She exhibits no distension. There is no tenderness. There is no rebound and no guarding.  Musculoskeletal: She exhibits no edema.  Lymphadenopathy:    She has no cervical adenopathy.  Neurological: She displays normal reflexes. She exhibits normal muscle tone. Coordination normal.          Assessment & Plan:  Type II diabetes mellitus with end-stage renal disease - Plan: Hemoglobin A1c, Lipid panel, TSH, Microalbumin, urine  Other specified hypothyroidism - Plan: TSH  HLD (hyperlipidemia)  Patient's diabetes seems well controlled. I will check a hemoglobin A1c as well as a urine microalbumin. She is taking an aspirin on a daily basis. I will also check a fasting lipid panel. There are LDL cholesterol less than 70 given her history of coronary artery disease. I rechecked a TSH. Patient received a flu shot as well as Prevnar 13 today. I recommended diet exercise and weight loss.

## 2014-04-25 ENCOUNTER — Other Ambulatory Visit: Payer: Self-pay | Admitting: Family Medicine

## 2014-04-25 NOTE — Telephone Encounter (Signed)
Refill appropriate and filled per protocol. 

## 2014-05-08 ENCOUNTER — Telehealth: Payer: Self-pay | Admitting: Family Medicine

## 2014-05-08 NOTE — Telephone Encounter (Signed)
Patient is calling to get rx for diabetic shoes  Would like this called into [facility] possible (that is what she said the name was) their phone number is [phone]

## 2014-05-08 NOTE — Telephone Encounter (Signed)
Patient is calling to get diabetic shoes

## 2014-05-15 NOTE — Telephone Encounter (Signed)
Order and ov notes faxed to [facility] with confirmation

## 2014-05-20 ENCOUNTER — Other Ambulatory Visit: Payer: Self-pay | Admitting: Family Medicine

## 2014-06-24 ENCOUNTER — Encounter: Payer: Self-pay | Admitting: Family Medicine

## 2014-06-24 ENCOUNTER — Ambulatory Visit (INDEPENDENT_AMBULATORY_CARE_PROVIDER_SITE_OTHER): Payer: Medicare HMO | Admitting: Family Medicine

## 2014-06-24 VITALS — BP 120/70 | HR 64 | Temp 98.0°F | Resp 18 | Wt 254.0 lb

## 2014-06-24 DIAGNOSIS — R1013 Epigastric pain: Secondary | ICD-10-CM

## 2014-06-24 DIAGNOSIS — J310 Chronic rhinitis: Secondary | ICD-10-CM

## 2014-06-24 DIAGNOSIS — J329 Chronic sinusitis, unspecified: Secondary | ICD-10-CM

## 2014-06-24 LAB — COMPLETE METABOLIC PANEL WITH GFR
ALT: 44 U/L — ABNORMAL HIGH (ref 0–35)
AST: 28 U/L (ref 0–37)
Albumin: 4 g/dL (ref 3.5–5.2)
Alkaline Phosphatase: 105 U/L (ref 39–117)
BUN: 65 mg/dL — ABNORMAL HIGH (ref 6–23)
CALCIUM: 9.6 mg/dL (ref 8.4–10.5)
CO2: 26 meq/L (ref 19–32)
CREATININE: 2.77 mg/dL — AB (ref 0.50–1.10)
Chloride: 100 mEq/L (ref 96–112)
GFR, EST AFRICAN AMERICAN: 19 mL/min — AB
GFR, Est Non African American: 17 mL/min — ABNORMAL LOW
Glucose, Bld: 99 mg/dL (ref 70–99)
Potassium: 5.1 mEq/L (ref 3.5–5.3)
Sodium: 138 mEq/L (ref 135–145)
Total Bilirubin: 0.5 mg/dL (ref 0.2–1.2)
Total Protein: 7 g/dL (ref 6.0–8.3)

## 2014-06-24 LAB — CBC WITH DIFFERENTIAL/PLATELET
Basophils Absolute: 0.1 10*3/uL (ref 0.0–0.1)
Basophils Relative: 1 % (ref 0–1)
EOS PCT: 4 % (ref 0–5)
Eosinophils Absolute: 0.3 10*3/uL (ref 0.0–0.7)
HEMATOCRIT: 34 % — AB (ref 36.0–46.0)
HEMOGLOBIN: 10.6 g/dL — AB (ref 12.0–15.0)
Lymphocytes Relative: 36 % (ref 12–46)
Lymphs Abs: 2.4 10*3/uL (ref 0.7–4.0)
MCH: 27.5 pg (ref 26.0–34.0)
MCHC: 31.2 g/dL (ref 30.0–36.0)
MCV: 88.3 fL (ref 78.0–100.0)
MONO ABS: 0.5 10*3/uL (ref 0.1–1.0)
MPV: 10.1 fL (ref 9.4–12.4)
Monocytes Relative: 7 % (ref 3–12)
Neutro Abs: 3.4 10*3/uL (ref 1.7–7.7)
Neutrophils Relative %: 52 % (ref 43–77)
Platelets: 277 10*3/uL (ref 150–400)
RBC: 3.85 MIL/uL — AB (ref 3.87–5.11)
RDW: 14.5 % (ref 11.5–15.5)
WBC: 6.6 10*3/uL (ref 4.0–10.5)

## 2014-06-24 LAB — LIPASE: Lipase: 21 U/L (ref 0–75)

## 2014-06-24 MED ORDER — AMOXICILLIN 875 MG PO TABS
875.0000 mg | ORAL_TABLET | Freq: Two times a day (BID) | ORAL | Status: DC
Start: 2014-06-24 — End: 2014-10-24

## 2014-06-24 NOTE — Progress Notes (Signed)
Subjective:    Patient ID: Emily Hughes, female    DOB: 1940/07/24, 73 y.o.   MRN: [id number]  HPI Patient has a remote history of pancreatitis. She states that her symptoms have returned. It began approximately 2 weeks ago. She reports a constant gnawing pain in her epigastric area underneath her xiphoid process. It is worsened by food. There is no chest pain or angina. There is no shortness of breath or dyspnea on exertion. She denies any hematemesis or melena. She denies any fevers or chills. She does report increased gas and indigestion. She is currently on omeprazole 20 mg by mouth daily. She's also had left maxillary sinus pain and pressure for 2 weeks. She is reports left frontal sinus pain and pressure for 1 week. She reports a cough productive of yellow and green mucus. She reports postnasal drip. Past Medical History  Diagnosis Date  . High cholesterol   . Diabetes mellitus   . Hypertension   . Anemia   . CAD (coronary artery disease)   . Pancreatitis   . Colon polyps   . Proteinuria   . DDD (degenerative disc disease)   . CRI (chronic renal insufficiency)   . GERD (gastroesophageal reflux disease)    Past Surgical History  Procedure Laterality Date  . Back surgery    . Ankle surgery    . Tonsillectomy    . Angioplasty     Current Outpatient Prescriptions on File Prior to Visit  Medication Sig Dispense Refill  . ACCU-CHEK SOFTCLIX LANCETS lancets Use as instructed 100 each 12  . allopurinol (ZYLOPRIM) 300 MG tablet Take 1 tablet (300 mg total) by mouth daily. 90 tablet 1  . aspirin 81 MG tablet Take 81 mg by mouth daily.      . Blood Glucose Monitoring Suppl (ACCU-CHEK AVIVA PLUS) W/DEVICE KIT Dx - 250.00 1 kit 0  . calcitRIOL (ROCALTROL) 0.25 MCG capsule Take 0.25 mcg by mouth daily.    . cloNIDine (CATAPRES) 0.1 MG tablet Take 0.1 mg by mouth 2 (two) times daily.      Marland Kitchen diltiazem (CARDIZEM CD) 300 MG 24 hr capsule Take 1 capsule (300 mg total) by mouth daily. 90  capsule 1  . ferrous fumarate (FERRO-SEQUELS) 50 MG CR tablet Take 50 mg by mouth daily.      . furosemide (LASIX) 40 MG tablet TAKE 2 TABLETS IN THE MORNING AND 1 TABLET IN THE EVENING. 270 tablet 3  . glimepiride (AMARYL) 4 MG tablet TAKE 1 TABLET ONCE DAILY. 30 tablet 3  . glucose blood (ACCU-CHEK AVIVA PLUS) test strip Checks BS 4-5 times per day, QAM, Q AC Meals, QHS - DX-250.00 100 each 12  . insulin detemir (LEVEMIR) 100 UNIT/ML injection Inject 0.38 mLs (38 Units total) into the skin daily. 10 mL 3  . levothyroxine (SYNTHROID, LEVOTHROID) 50 MCG tablet TAKE 1 TABLET ONCE DAILY. 90 tablet 3  . metoprolol succinate (TOPROL-XL) 25 MG 24 hr tablet TAKE (1) TABLET DAILY. 90 tablet 3  . mometasone (ELOCON) 0.1 % ointment APPLY TOPICALLY DAILY. 45 g 3  . NOVOLOG 100 UNIT/ML injection INJECT 6 UNITS AT LUNCH. 10 mL 5  . omeprazole (PRILOSEC) 20 MG capsule Take 20 mg by mouth daily.      . pravastatin (PRAVACHOL) 80 MG tablet TAKE ONE TABLET AT BEDTIME. 90 tablet 1  . valsartan (DIOVAN) 160 MG tablet Take 160 mg by mouth daily.      . cetirizine (ZYRTEC) 10 MG tablet Take 1 tablet (  10 mg total) by mouth daily. 30 tablet 11   No current facility-administered medications on file prior to visit.   Allergies  Allergen Reactions  . Ace Inhibitors   . Actos [Pioglitazone Hydrochloride]   . Dust Mite Extract   . Dye Fdc Red [Erythrosine Red No. 3]    History   Social History  . Marital Status: Widowed    Spouse Name: N/A    Number of Children: N/A  . Years of Education: N/A   Occupational History  . Not on file.   Social History Main Topics  . Smoking status: Former Research scientist (life sciences)  . Smokeless tobacco: Not on file  . Alcohol Use: No  . Drug Use: No  . Sexual Activity: Not on file   Other Topics Concern  . Not on file   Social History Narrative      Review of Systems  All other systems reviewed and are negative.      Objective:   Physical Exam  Constitutional: She appears  well-developed and well-nourished. No distress.  Neck: No JVD present.  Cardiovascular: Normal rate, regular rhythm and normal heart sounds.   No murmur heard. Pulmonary/Chest: Effort normal and breath sounds normal. No respiratory distress. She has no wheezes. She has no rales.  Abdominal: Soft. Bowel sounds are normal. She exhibits no distension. There is tenderness. There is no rebound and no guarding.  Musculoskeletal: She exhibits no edema.  Skin: She is not diaphoretic.  Vitals reviewed.         Assessment & Plan:  Rhinosinusitis - Plan: amoxicillin (AMOXIL) 875 MG tablet  Abdominal pain, epigastric - Plan: CBC with Differential, COMPLETE METABOLIC PANEL WITH GFR, Lipase  Patient's pain does not sound cardiopulmonary in nature. I'm concerned about possible ulcer versus pancreatitis. I will check a CBC, CMP, lipase. If her lipase is elevated, I will switch the patient to a clear liquid diet and focus on pain control. We may also need to discontinue her sulfonylurea and temporarily hold her Lasix. If the patient's lipase and lab work is normal however, her symptoms could be due to an ulcer. Therefore I've asked the patient to discontinue omeprazole and switch to Nexium 40 mg by mouth daily. I will await her lab results

## 2014-07-02 ENCOUNTER — Telehealth: Payer: Self-pay | Admitting: Family Medicine

## 2014-07-02 MED ORDER — SUCRALFATE 1 GM/10ML PO SUSP: 1.0000 g | Freq: Three times a day (TID) | ORAL | Status: DC

## 2014-07-02 MED ORDER — ESOMEPRAZOLE MAGNESIUM 20 MG PO CPDR: 20.0000 mg | DELAYED_RELEASE_CAPSULE | Freq: Two times a day (BID) | ORAL | Status: DC

## 2014-07-02 NOTE — Telephone Encounter (Signed)
Pt aware of lab results and provider recommendations.  Rx's to pharmacy and appt made for next week

## 2014-07-02 NOTE — Telephone Encounter (Signed)
-----   Message from Susy Frizzle, MD sent at 06/25/2014  7:16 AM EST ----- Labs do not appear to be pancreatitis.  I want her to start nexium 40 BID  And add sucralfate 1 g poqachs  for possible ulcer and recheck here in 1 week.

## 2014-07-08 ENCOUNTER — Telehealth: Payer: Self-pay | Admitting: Family Medicine

## 2014-07-08 NOTE — Telephone Encounter (Signed)
Rx for Nexium should be 40 mg bid not 20 mg bid - when PA comes back will need to send a new rx into pharmacy for increased dosage.    PA submitted through [email or web address]  -

## 2014-07-09 NOTE — Telephone Encounter (Signed)
Received fax from [facility].   Patient policy has termed, therefore PA dismissed.   Call placed to patient to determine current insurance and prescription coverage.   Patient states that she has an appointment on Thursday and will bring in the current insurance information.

## 2014-07-11 ENCOUNTER — Ambulatory Visit (INDEPENDENT_AMBULATORY_CARE_PROVIDER_SITE_OTHER): Payer: PPO | Admitting: Family Medicine

## 2014-07-11 ENCOUNTER — Encounter: Payer: Self-pay | Admitting: Family Medicine

## 2014-07-11 VITALS — BP 140/70 | HR 80 | Temp 97.9°F | Resp 20 | Ht 63.0 in | Wt 259.0 lb

## 2014-07-11 DIAGNOSIS — R1013 Epigastric pain: Secondary | ICD-10-CM

## 2014-07-11 MED ORDER — SUCRALFATE 1 G PO TABS: 1.0000 g | ORAL_TABLET | Freq: Three times a day (TID) | ORAL | Status: DC

## 2014-07-11 MED ORDER — ESOMEPRAZOLE MAGNESIUM 40 MG PO CPDR: 40.0000 mg | DELAYED_RELEASE_CAPSULE | Freq: Two times a day (BID) | ORAL | Status: DC

## 2014-07-11 NOTE — Progress Notes (Signed)
Subjective:    Patient ID: Emily Hughes, female    DOB: 04/01/1941, 74 y.o.   MRN: [id number]  HPI 06/24/14 Patient has a remote history of pancreatitis. She states that her symptoms have returned. It began approximately 2 weeks ago. She reports a constant gnawing pain in her epigastric area underneath her xiphoid process. It is worsened by food. There is no chest pain or angina. There is no shortness of breath or dyspnea on exertion. She denies any hematemesis or melena. She denies any fevers or chills. She does report increased gas and indigestion. She is currently on omeprazole 20 mg by mouth daily. She's also had left maxillary sinus pain and pressure for 2 weeks. She is reports left frontal sinus pain and pressure for 1 week. She reports a cough productive of yellow and green mucus. She reports postnasal drip.  At that time, my plan was: Patient's pain does not sound cardiopulmonary in nature. I'm concerned about possible ulcer versus pancreatitis. I will check a CBC, CMP, lipase. If her lipase is elevated, I will switch the patient to a clear liquid diet and focus on pain control. We may also need to discontinue her sulfonylurea and temporarily hold her Lasix. If the patient's lipase and lab work is normal however, her symptoms could be due to an ulcer. Therefore I've asked the patient to discontinue omeprazole and switch to Nexium 40 mg by mouth daily. I will await her lab results  Office Visit on 06/24/2014  Component Date Value Ref Range Status  . WBC 06/24/2014 6.6  4.0 - 10.5 K/uL Final  . RBC 06/24/2014 3.85* 3.87 - 5.11 MIL/uL Final  . Hemoglobin 06/24/2014 10.6* 12.0 - 15.0 g/dL Final  . HCT 06/24/2014 34.0* 36.0 - 46.0 % Final  . MCV 06/24/2014 88.3  78.0 - 100.0 fL Final  . MCH 06/24/2014 27.5  26.0 - 34.0 pg Final  . MCHC 06/24/2014 31.2  30.0 - 36.0 g/dL Final  . RDW 06/24/2014 14.5  11.5 - 15.5 % Final  . Platelets 06/24/2014 277  150 - 400 K/uL Final  . MPV 06/24/2014  10.1  9.4 - 12.4 fL Final  . Neutrophils Relative % 06/24/2014 52  43 - 77 % Final  . Neutro Abs 06/24/2014 3.4  1.7 - 7.7 K/uL Final  . Lymphocytes Relative 06/24/2014 36  12 - 46 % Final  . Lymphs Abs 06/24/2014 2.4  0.7 - 4.0 K/uL Final  . Monocytes Relative 06/24/2014 7  3 - 12 % Final  . Monocytes Absolute 06/24/2014 0.5  0.1 - 1.0 K/uL Final  . Eosinophils Relative 06/24/2014 4  0 - 5 % Final  . Eosinophils Absolute 06/24/2014 0.3  0.0 - 0.7 K/uL Final  . Basophils Relative 06/24/2014 1  0 - 1 % Final  . Basophils Absolute 06/24/2014 0.1  0.0 - 0.1 K/uL Final  . Smear Review 06/24/2014 Criteria for review not met   Final  . Sodium 06/24/2014 138  135 - 145 mEq/L Final  . Potassium 06/24/2014 5.1  3.5 - 5.3 mEq/L Final  . Chloride 06/24/2014 100  96 - 112 mEq/L Final  . CO2 06/24/2014 26  19 - 32 mEq/L Final  . Glucose, Bld 06/24/2014 99  70 - 99 mg/dL Final  . BUN 06/24/2014 65* 6 - 23 mg/dL Final  . Creat 06/24/2014 2.77* 0.50 - 1.10 mg/dL Final  . Total Bilirubin 06/24/2014 0.5  0.2 - 1.2 mg/dL Final  . Alkaline Phosphatase 06/24/2014 105  39 -  117 U/L Final  . AST 06/24/2014 28  0 - 37 U/L Final  . ALT 06/24/2014 44* 0 - 35 U/L Final  . Total Protein 06/24/2014 7.0  6.0 - 8.3 g/dL Final  . Albumin 06/24/2014 4.0  3.5 - 5.2 g/dL Final  . Calcium 06/24/2014 9.6  8.4 - 10.5 mg/dL Final  . GFR, Est African American 06/24/2014 19*  Final  . GFR, Est Non African American 06/24/2014 17*  Final   Comment:   The estimated GFR is a calculation valid for adults (>=5 years old) that uses the CKD-EPI algorithm to adjust for age and sex. It is   not to be used for children, pregnant women, hospitalized patients,    patients on dialysis, or with rapidly changing kidney function. According to the NKDEP, eGFR >89 is normal, 60-89 shows mild impairment, 30-59 shows moderate impairment, 15-29 shows severe impairment and <15 is ESRD.     . Lipase 06/24/2014 21  0 - 75 U/L Final    07/11/13 Labs do not suggest pancreatitis. The patient is here today for follow-up. Her abdominal pain has improved dramatically since beginning Nexium 40 mg by mouth twice a day and sucralfate 1 g by mouth every before meals at bedtime. She states that the pain is essentially resolved. She does continue have occasional nausea but it is improving dramatically. She is requesting a diabetic foot exam to try to have her diabetic shoes paid for by insurance. She denies any neuropathy in her feet. She has no previous history of ulceration. She has no previous history of surgery or amputation. She does have pes planus bilaterally Past Medical History  Diagnosis Date  . High cholesterol   . Diabetes mellitus   . Hypertension   . Anemia   . CAD (coronary artery disease)   . Pancreatitis   . Colon polyps   . Proteinuria   . DDD (degenerative disc disease)   . CRI (chronic renal insufficiency)   . GERD (gastroesophageal reflux disease)    Past Surgical History  Procedure Laterality Date  . Back surgery    . Ankle surgery    . Tonsillectomy    . Angioplasty     Current Outpatient Prescriptions on File Prior to Visit  Medication Sig Dispense Refill  . ACCU-CHEK SOFTCLIX LANCETS lancets Use as instructed 100 each 12  . allopurinol (ZYLOPRIM) 300 MG tablet Take 1 tablet (300 mg total) by mouth daily. 90 tablet 1  . amoxicillin (AMOXIL) 875 MG tablet Take 1 tablet (875 mg total) by mouth 2 (two) times daily. 20 tablet 0  . aspirin 81 MG tablet Take 81 mg by mouth daily.      . Blood Glucose Monitoring Suppl (ACCU-CHEK AVIVA PLUS) W/DEVICE KIT Dx - 250.00 1 kit 0  . calcitRIOL (ROCALTROL) 0.25 MCG capsule Take 0.25 mcg by mouth daily.    . cetirizine (ZYRTEC) 10 MG tablet Take 1 tablet (10 mg total) by mouth daily. 30 tablet 11  . cloNIDine (CATAPRES) 0.1 MG tablet Take 0.1 mg by mouth 2 (two) times daily.      Marland Kitchen diltiazem (CARDIZEM CD) 300 MG 24 hr capsule Take 1 capsule (300 mg total) by mouth  daily. 90 capsule 1  . esomeprazole (NEXIUM) 20 MG capsule Take 1 capsule (20 mg total) by mouth 2 (two) times daily before a meal. 60 capsule 5  . ferrous fumarate (FERRO-SEQUELS) 50 MG CR tablet Take 50 mg by mouth daily.      Marland Kitchen  furosemide (LASIX) 40 MG tablet TAKE 2 TABLETS IN THE MORNING AND 1 TABLET IN THE EVENING. 270 tablet 3  . glimepiride (AMARYL) 4 MG tablet TAKE 1 TABLET ONCE DAILY. 30 tablet 3  . glucose blood (ACCU-CHEK AVIVA PLUS) test strip Checks BS 4-5 times per day, QAM, Q AC Meals, QHS - DX-250.00 100 each 12  . insulin detemir (LEVEMIR) 100 UNIT/ML injection Inject 0.38 mLs (38 Units total) into the skin daily. 10 mL 3  . levothyroxine (SYNTHROID, LEVOTHROID) 50 MCG tablet TAKE 1 TABLET ONCE DAILY. 90 tablet 3  . metoprolol succinate (TOPROL-XL) 25 MG 24 hr tablet TAKE (1) TABLET DAILY. 90 tablet 3  . mometasone (ELOCON) 0.1 % ointment APPLY TOPICALLY DAILY. 45 g 3  . NOVOLOG 100 UNIT/ML injection INJECT 6 UNITS AT LUNCH. 10 mL 5  . omeprazole (PRILOSEC) 20 MG capsule Take 20 mg by mouth daily.      . pravastatin (PRAVACHOL) 80 MG tablet TAKE ONE TABLET AT BEDTIME. 90 tablet 1  . sucralfate (CARAFATE) 1 GM/10ML suspension Take 10 mLs (1 g total) by mouth 4 (four) times daily -  with meals and at bedtime. 420 mL 5  . UNABLE TO FIND Takes a potassium suspension daily    . valsartan (DIOVAN) 160 MG tablet Take 160 mg by mouth daily.       No current facility-administered medications on file prior to visit.   Allergies  Allergen Reactions  . Ace Inhibitors   . Actos [Pioglitazone Hydrochloride]   . Dust Mite Extract   . Dye Fdc Red [Erythrosine Red No. 3]    History   Social History  . Marital Status: Widowed    Spouse Name: N/A    Number of Children: N/A  . Years of Education: N/A   Occupational History  . Not on file.   Social History Main Topics  . Smoking status: Former Research scientist (life sciences)  . Smokeless tobacco: Not on file  . Alcohol Use: No  . Drug Use: No  . Sexual  Activity: Not on file   Other Topics Concern  . Not on file   Social History Narrative      Review of Systems  All other systems reviewed and are negative.      Objective:   Physical Exam  Constitutional: She appears well-developed and well-nourished. No distress.  Neck: No JVD present.  Cardiovascular: Normal rate, regular rhythm and normal heart sounds.   No murmur heard. Pulmonary/Chest: Effort normal and breath sounds normal. No respiratory distress. She has no wheezes. She has no rales.  Abdominal: Soft. Bowel sounds are normal. She exhibits no distension. There is no tenderness. There is no rebound and no guarding.  Musculoskeletal: She exhibits no edema.  Skin: She is not diaphoretic.  Vitals reviewed.         Assessment & Plan:  Abdominal pain, epigastric - Plan: sucralfate (CARAFATE) 1 G tablet  I believe that the patient is likely having peptic ulcer disease. I want her to take Nexium 40 mg by mouth twice a day for 1 month along with sucralfate 1 g by mouth every before meals at bedtime for one month. The pain completely improved at that point she can decrease Nexium to 40 mg a day. If the pain persists or worsens, I would recommend an EGD through GI. I performed a diabetic foot exam today. I'm not sure if she will qualify based on her insurance.

## 2014-07-11 NOTE — Telephone Encounter (Signed)
Call placed to [facility]. Reports that Nexium 20mg  PO BID was filled on 07/10/2014 through new insurance.   Corrected prescription sent to pharmacy. Pharmacy states that they will fax office with PA # if required.

## 2014-07-11 NOTE — Telephone Encounter (Signed)
Call placed to [facility] to inquire if medication was covered by new insurance.   Was advised that Nexium 40mg  PO BID was covered.   Call placed to patient and patient made aware.

## 2014-07-12 ENCOUNTER — Telehealth: Payer: Self-pay | Admitting: Family Medicine

## 2014-07-18 ENCOUNTER — Telehealth: Payer: Self-pay | Admitting: Family Medicine

## 2014-07-18 NOTE — Telephone Encounter (Signed)
[phone] PT is needing refills on her medications I asked her what were her medications she was needing a refill on and she said all of her medications and I tried to explain to her that I needed to know the name of the medications that I am required to put them in. She got mad and said she didn't know the name of the medications and she wants you to call her.

## 2014-07-19 MED ORDER — FUROSEMIDE 40 MG PO TABS: ORAL_TABLET | ORAL | Status: DC

## 2014-07-19 MED ORDER — METOPROLOL SUCCINATE ER 25 MG PO TB24: ORAL_TABLET | ORAL | Status: DC

## 2014-07-19 MED ORDER — ESOMEPRAZOLE MAGNESIUM 40 MG PO CPDR: 40.0000 mg | DELAYED_RELEASE_CAPSULE | Freq: Two times a day (BID) | ORAL | Status: DC

## 2014-07-19 MED ORDER — DILTIAZEM HCL ER COATED BEADS 300 MG PO CP24: 300.0000 mg | ORAL_CAPSULE | Freq: Every day | ORAL | Status: DC

## 2014-07-19 MED ORDER — PRAVASTATIN SODIUM 80 MG PO TABS: ORAL_TABLET | ORAL | Status: DC

## 2014-07-19 MED ORDER — GLIMEPIRIDE 4 MG PO TABS: 4.0000 mg | ORAL_TABLET | Freq: Every day | ORAL | Status: DC

## 2014-07-19 MED ORDER — CLONIDINE HCL 0.1 MG PO TABS: 0.1000 mg | ORAL_TABLET | Freq: Two times a day (BID) | ORAL | Status: DC

## 2014-07-19 MED ORDER — LEVOTHYROXINE SODIUM 50 MCG PO TABS: 50.0000 ug | ORAL_TABLET | Freq: Every day | ORAL | Status: DC

## 2014-07-19 MED ORDER — SODIUM BICARBONATE 650 MG PO TABS: 650.0000 mg | ORAL_TABLET | Freq: Four times a day (QID) | ORAL | Status: DC

## 2014-07-19 NOTE — Telephone Encounter (Signed)
Meds sent to pharm per pt's written request as to which meds needed to go to [facility]   Pt's list included Glimpiride, Metoprolol, furosemide, diltiazem, levothyroxine, clonidine, nexium, sodium bicarb and pravastatin.

## 2014-07-23 ENCOUNTER — Telehealth: Payer: Self-pay | Admitting: Family Medicine

## 2014-07-23 MED ORDER — ESOMEPRAZOLE MAGNESIUM 40 MG PO CPDR: 40.0000 mg | DELAYED_RELEASE_CAPSULE | Freq: Two times a day (BID) | ORAL | Status: DC

## 2014-07-23 NOTE — Telephone Encounter (Signed)
Mail order pharmacy called to [facility] 90 day refill for Nexium

## 2014-07-24 MED ORDER — INSULIN ASPART 100 UNIT/ML FLEXPEN: 10.0000 [IU] | PEN_INJECTOR | Freq: Every day | SUBCUTANEOUS | Status: DC

## 2014-07-24 MED ORDER — INSULIN PEN NEEDLE 31G X 5 MM MISC
Status: DC
Start: 2014-07-24 — End: 2015-07-24

## 2014-07-24 MED ORDER — INSULIN GLARGINE 100 UNIT/ML SOLOSTAR PEN: 38.0000 [IU] | PEN_INJECTOR | Freq: Every day | SUBCUTANEOUS | Status: DC

## 2014-07-24 NOTE — Telephone Encounter (Signed)
Pt was needing a refill on her Insulin as well. With her new insurance not sure if they will cover vials or pens, called and spoke to ins co and they did not know either. However Lantus is Tier 2 and Levemir is Teir 3 so she was switched back to insulin and pens for both meds with needles sent to pharm.

## 2014-08-08 ENCOUNTER — Telehealth: Payer: Self-pay | Admitting: *Deleted

## 2014-08-08 NOTE — Telephone Encounter (Signed)
Received fax requesting refill on Nexium.   Call placed to pharmacy to inquire.   Advised that Jan 2016 fill was temporary supply and medication would need QL exception for BID dosing.   PA submitted.   Dx: GERD (K21.9).

## 2014-08-12 NOTE — Telephone Encounter (Signed)
Faxed to [facility] and [employer]

## 2014-08-12 NOTE — Telephone Encounter (Signed)
Approved through 07/05/2015 - Faxed to pharm

## 2014-09-24 ENCOUNTER — Encounter: Payer: Self-pay | Admitting: Family Medicine

## 2014-09-24 ENCOUNTER — Ambulatory Visit (INDEPENDENT_AMBULATORY_CARE_PROVIDER_SITE_OTHER): Payer: PPO | Admitting: Family Medicine

## 2014-09-24 VITALS — BP 144/80 | HR 80 | Temp 98.0°F | Resp 18 | Ht 63.0 in | Wt 256.0 lb

## 2014-09-24 DIAGNOSIS — R1013 Epigastric pain: Secondary | ICD-10-CM

## 2014-09-24 NOTE — Progress Notes (Signed)
Subjective:    Patient ID: Emily Hughes, female    DOB: January 08, 1941, 74 y.o.   MRN: [id number]  HPI 06/24/14 Patient has a remote history of pancreatitis. She states that her symptoms have returned. It began approximately 2 weeks ago. She reports a constant gnawing pain in her epigastric area underneath her xiphoid process. It is worsened by food. There is no chest pain or angina. There is no shortness of breath or dyspnea on exertion. She denies any hematemesis or melena. She denies any fevers or chills. She does report increased gas and indigestion. She is currently on omeprazole 20 mg by mouth daily. She's also had left maxillary sinus pain and pressure for 2 weeks. She is reports left frontal sinus pain and pressure for 1 week. She reports a cough productive of yellow and green mucus. She reports postnasal drip.  At that time, my plan was: Patient's pain does not sound cardiopulmonary in nature. I'm concerned about possible ulcer versus pancreatitis. I will check a CBC, CMP, lipase. If her lipase is elevated, I will switch the patient to a clear liquid diet and focus on pain control. We may also need to discontinue her sulfonylurea and temporarily hold her Lasix. If the patient's lipase and lab work is normal however, her symptoms could be due to an ulcer. Therefore I've asked the patient to discontinue omeprazole and switch to Nexium 40 mg by mouth daily. I will await her lab results  No visits with results within 1 Month(s) from this visit. Latest known visit with results is:  Office Visit on 06/24/2014  Component Date Value Ref Range Status  . WBC 06/24/2014 6.6  4.0 - 10.5 K/uL Final  . RBC 06/24/2014 3.85* 3.87 - 5.11 MIL/uL Final  . Hemoglobin 06/24/2014 10.6* 12.0 - 15.0 g/dL Final  . HCT 06/24/2014 34.0* 36.0 - 46.0 % Final  . MCV 06/24/2014 88.3  78.0 - 100.0 fL Final  . MCH 06/24/2014 27.5  26.0 - 34.0 pg Final  . MCHC 06/24/2014 31.2  30.0 - 36.0 g/dL Final  . RDW 06/24/2014  14.5  11.5 - 15.5 % Final  . Platelets 06/24/2014 277  150 - 400 K/uL Final  . MPV 06/24/2014 10.1  9.4 - 12.4 fL Final  . Neutrophils Relative % 06/24/2014 52  43 - 77 % Final  . Neutro Abs 06/24/2014 3.4  1.7 - 7.7 K/uL Final  . Lymphocytes Relative 06/24/2014 36  12 - 46 % Final  . Lymphs Abs 06/24/2014 2.4  0.7 - 4.0 K/uL Final  . Monocytes Relative 06/24/2014 7  3 - 12 % Final  . Monocytes Absolute 06/24/2014 0.5  0.1 - 1.0 K/uL Final  . Eosinophils Relative 06/24/2014 4  0 - 5 % Final  . Eosinophils Absolute 06/24/2014 0.3  0.0 - 0.7 K/uL Final  . Basophils Relative 06/24/2014 1  0 - 1 % Final  . Basophils Absolute 06/24/2014 0.1  0.0 - 0.1 K/uL Final  . Smear Review 06/24/2014 Criteria for review not met   Final  . Sodium 06/24/2014 138  135 - 145 mEq/L Final  . Potassium 06/24/2014 5.1  3.5 - 5.3 mEq/L Final  . Chloride 06/24/2014 100  96 - 112 mEq/L Final  . CO2 06/24/2014 26  19 - 32 mEq/L Final  . Glucose, Bld 06/24/2014 99  70 - 99 mg/dL Final  . BUN 06/24/2014 65* 6 - 23 mg/dL Final  . Creat 06/24/2014 2.77* 0.50 - 1.10 mg/dL Final  . Total  Bilirubin 06/24/2014 0.5  0.2 - 1.2 mg/dL Final  . Alkaline Phosphatase 06/24/2014 105  39 - 117 U/L Final  . AST 06/24/2014 28  0 - 37 U/L Final  . ALT 06/24/2014 44* 0 - 35 U/L Final  . Total Protein 06/24/2014 7.0  6.0 - 8.3 g/dL Final  . Albumin 06/24/2014 4.0  3.5 - 5.2 g/dL Final  . Calcium 06/24/2014 9.6  8.4 - 10.5 mg/dL Final  . GFR, Est African American 06/24/2014 19*  Final  . GFR, Est Non African American 06/24/2014 17*  Final   Comment:   The estimated GFR is a calculation valid for adults (>=50 years old) that uses the CKD-EPI algorithm to adjust for age and sex. It is   not to be used for children, pregnant women, hospitalized patients,    patients on dialysis, or with rapidly changing kidney function. According to the NKDEP, eGFR >89 is normal, 60-89 shows mild impairment, 30-59 shows moderate impairment, 15-29  shows severe impairment and <15 is ESRD.     . Lipase 06/24/2014 21  0 - 75 U/L Final   07/11/13 Labs do not suggest pancreatitis. The patient is here today for follow-up. Her abdominal pain has improved dramatically since beginning Nexium 40 mg by mouth twice a day and sucralfate 1 g by mouth every before meals at bedtime. She states that the pain is essentially resolved. She does continue have occasional nausea but it is improving dramatically. She is requesting a diabetic foot exam to try to have her diabetic shoes paid for by insurance. She denies any neuropathy in her feet. She has no previous history of ulceration. She has no previous history of surgery or amputation. She does have pes planus bilaterally.  AT that time, my plan was: I believe that the patient is likely having peptic ulcer disease. I want her to take Nexium 40 mg by mouth twice a day for 1 month along with sucralfate 1 g by mouth every before meals at bedtime for one month. The pain completely improved at that point she can decrease Nexium to 40 mg a day. If the pain persists or worsens, I would recommend an EGD through GI. I performed a diabetic foot exam today. I'm not sure if she will qualify based on her insurance.  09/24/14 Patient is here today for recheck. She states that she continues to have daily epigastric abdominal pain. It is no better on Nexium and sucralfate. She denies any melena or hematochezia. She denies any early satiety. She has lost 3 pounds since last saw her in January she denies any significant weight loss. She denies any fevers or nausea or vomiting. She denies any diarrhea.  She states that the pain feels like her previous episodes of pancreatitis although her lipase was normal we checked it back in December. There are no exacerbating or alleviating factors other than eating food.  She does report bloating and increased abdominal gas and distention.   Past Medical History  Diagnosis Date  . High cholesterol    . Diabetes mellitus   . Hypertension   . Anemia   . CAD (coronary artery disease)   . Pancreatitis   . Colon polyps   . Proteinuria   . DDD (degenerative disc disease)   . CRI (chronic renal insufficiency)   . GERD (gastroesophageal reflux disease)    Past Surgical History  Procedure Laterality Date  . Back surgery    . Ankle surgery    . Tonsillectomy    .  Angioplasty     Current Outpatient Prescriptions on File Prior to Visit  Medication Sig Dispense Refill  . ACCU-CHEK SOFTCLIX LANCETS lancets Use as instructed 100 each 12  . allopurinol (ZYLOPRIM) 300 MG tablet Take 1 tablet (300 mg total) by mouth daily. 90 tablet 1  . amoxicillin (AMOXIL) 875 MG tablet Take 1 tablet (875 mg total) by mouth 2 (two) times daily. 20 tablet 0  . aspirin 81 MG tablet Take 81 mg by mouth daily.      . Blood Glucose Monitoring Suppl (ACCU-CHEK AVIVA PLUS) W/DEVICE KIT Dx - 250.00 1 kit 0  . calcitRIOL (ROCALTROL) 0.25 MCG capsule Take 0.25 mcg by mouth daily.    . cetirizine (ZYRTEC) 10 MG tablet Take 1 tablet (10 mg total) by mouth daily. 30 tablet 11  . cloNIDine (CATAPRES) 0.1 MG tablet Take 1 tablet (0.1 mg total) by mouth 2 (two) times daily. 180 tablet 3  . diltiazem (CARDIZEM CD) 300 MG 24 hr capsule Take 1 capsule (300 mg total) by mouth daily. 90 capsule 3  . esomeprazole (NEXIUM) 40 MG capsule Take 1 capsule (40 mg total) by mouth 2 (two) times daily before a meal. 180 capsule 1  . ferrous fumarate (FERRO-SEQUELS) 50 MG CR tablet Take 50 mg by mouth daily.      . furosemide (LASIX) 40 MG tablet TAKE 2 TABLETS IN THE MORNING AND 1 TABLET IN THE EVENING. 270 tablet 3  . glimepiride (AMARYL) 4 MG tablet Take 1 tablet (4 mg total) by mouth daily. 90 tablet 3  . glucose blood (ACCU-CHEK AVIVA PLUS) test strip Checks BS 4-5 times per day, QAM, Q AC Meals, QHS - DX-250.00 100 each 12  . insulin aspart (NOVOLOG) 100 UNIT/ML FlexPen Inject 10 Units into the skin daily with lunch. 15 mL 3  .  insulin detemir (LEVEMIR) 100 UNIT/ML injection Inject 0.38 mLs (38 Units total) into the skin daily. 10 mL 3  . Insulin Glargine (LANTUS) 100 UNIT/ML Solostar Pen Inject 38 Units into the skin daily. 15 pen 3  . Insulin Pen Needle 31G X 5 MM MISC As directed for insulin use 300 each 3  . levothyroxine (SYNTHROID, LEVOTHROID) 50 MCG tablet Take 1 tablet (50 mcg total) by mouth daily. 90 tablet 3  . metoprolol succinate (TOPROL-XL) 25 MG 24 hr tablet TAKE (1) TABLET DAILY. 90 tablet 3  . mometasone (ELOCON) 0.1 % ointment APPLY TOPICALLY DAILY. 45 g 3  . pravastatin (PRAVACHOL) 80 MG tablet TAKE ONE TABLET AT BEDTIME. 90 tablet 1  . sodium bicarbonate 650 MG tablet Take 1 tablet (650 mg total) by mouth 4 (four) times daily. 360 tablet 1  . sucralfate (CARAFATE) 1 G tablet Take 1 tablet (1 g total) by mouth 4 (four) times daily -  with meals and at bedtime. 120 tablet 0  . sucralfate (CARAFATE) 1 GM/10ML suspension Take 10 mLs (1 g total) by mouth 4 (four) times daily -  with meals and at bedtime. 420 mL 5  . UNABLE TO FIND Takes a potassium suspension daily    . valsartan (DIOVAN) 160 MG tablet Take 160 mg by mouth daily.       No current facility-administered medications on file prior to visit.   Allergies  Allergen Reactions  . Ace Inhibitors   . Actos [Pioglitazone Hydrochloride]   . Dust Mite Extract   . Dye Fdc Red [Erythrosine Red No. 3]    History   Social History  . Marital Status:  Widowed    Spouse Name: N/A  . Number of Children: N/A  . Years of Education: N/A   Occupational History  . Not on file.   Social History Main Topics  . Smoking status: Former Research scientist (life sciences)  . Smokeless tobacco: Not on file  . Alcohol Use: No  . Drug Use: No  . Sexual Activity: Not on file   Other Topics Concern  . Not on file   Social History Narrative      Review of Systems  All other systems reviewed and are negative.      Objective:   Physical Exam  Constitutional: She appears  well-developed and well-nourished. No distress.  Neck: No JVD present.  Cardiovascular: Normal rate, regular rhythm and normal heart sounds.   No murmur heard. Pulmonary/Chest: Effort normal and breath sounds normal. No respiratory distress. She has no wheezes. She has no rales.  Abdominal: Soft. Bowel sounds are normal. She exhibits no distension. There is no tenderness. There is no rebound and no guarding.  Musculoskeletal: She exhibits no edema.  Skin: She is not diaphoretic.  Vitals reviewed.         Assessment & Plan:  Abdominal pain, epigastric - Plan: CT Abdomen Pelvis Wo Contrast, Ambulatory referral to Gastroenterology  at this point, the patient would benefit from a GI referral for an EGD. I will also schedule her for a CT scan of the abdomen and pelvis however I will not use contrast given her chronic kidney disease to evaluate for chronic pancreatitis or other abdominal pathology as a cause of her pain.   meanwhile I have recommended that she add a probiotic every day and also take Gas-X for symptom relief until we obtain the following workup

## 2014-09-26 LAB — HM MAMMOGRAPHY: HM MAMMO: NORMAL

## 2014-09-30 ENCOUNTER — Telehealth: Payer: Self-pay | Admitting: *Deleted

## 2014-09-30 NOTE — Telephone Encounter (Signed)
Submitted humana referral thru acuity connect for authorization [facility] with authorization number [id number]  Requesting provider: Cletus Gash T. Pickard,MD  Treating provider: [facility]   Number of visits: 1  Start Date:09/27/14  End Date:03/26/2015  Dx: R10.13-epigastric pain  Procedures: [id number] Abd&pelvis w/o contrast  Type of service: CT  Copy has been faxed to imaging and also verbally aware

## 2014-10-03 ENCOUNTER — Ambulatory Visit
Admission: RE | Admit: 2014-10-03 | Discharge: 2014-10-03 | Disposition: A | Discharge status: Home / Self Care | Payer: PPO | Source: Ambulatory Visit | Attending: Family Medicine | Admitting: Family Medicine

## 2014-10-03 DIAGNOSIS — R1013 Epigastric pain: Secondary | ICD-10-CM

## 2014-10-07 ENCOUNTER — Other Ambulatory Visit: Payer: Self-pay | Admitting: Family Medicine

## 2014-10-07 DIAGNOSIS — N2889 Other specified disorders of kidney and ureter: Secondary | ICD-10-CM

## 2014-10-08 ENCOUNTER — Other Ambulatory Visit: Payer: Self-pay | Admitting: Family Medicine

## 2014-10-08 DIAGNOSIS — R1013 Epigastric pain: Secondary | ICD-10-CM

## 2014-10-10 ENCOUNTER — Telehealth: Payer: Self-pay | Admitting: *Deleted

## 2014-10-10 NOTE — Telephone Encounter (Signed)
Patient has appointment at [address] [facility] on April 8 at 3:30pm arrival 3:10pm, pt is to not void 1hr prior to her study, Lmtrc to pt

## 2014-10-10 NOTE — Telephone Encounter (Signed)
Pt is aware of appt

## 2014-10-11 ENCOUNTER — Ambulatory Visit
Admission: RE | Admit: 2014-10-11 | Discharge: 2014-10-11 | Disposition: A | Discharge status: Home / Self Care | Payer: PPO | Source: Ambulatory Visit | Attending: Family Medicine | Admitting: Family Medicine

## 2014-10-11 DIAGNOSIS — N2889 Other specified disorders of kidney and ureter: Secondary | ICD-10-CM

## 2014-10-14 ENCOUNTER — Other Ambulatory Visit: Payer: Self-pay | Admitting: Family Medicine

## 2014-10-14 DIAGNOSIS — R1013 Epigastric pain: Secondary | ICD-10-CM

## 2014-10-24 ENCOUNTER — Encounter: Payer: Self-pay | Admitting: Physician Assistant

## 2014-10-24 ENCOUNTER — Ambulatory Visit (INDEPENDENT_AMBULATORY_CARE_PROVIDER_SITE_OTHER): Payer: PPO | Admitting: Physician Assistant

## 2014-10-24 VITALS — BP 128/74 | HR 68 | Temp 98.5°F | Resp 18 | Wt 256.0 lb

## 2014-10-24 DIAGNOSIS — N63 Unspecified lump in breast: Secondary | ICD-10-CM

## 2014-10-24 DIAGNOSIS — N6320 Unspecified lump in the left breast, unspecified quadrant: Secondary | ICD-10-CM

## 2014-10-24 NOTE — Progress Notes (Signed)
Patient ID: Emily Hughes MRN: [id number], DOB: 05/24/1941, 74 y.o. Date of Encounter: 10/24/2014, 2:25 PM    Chief Complaint:  Chief Complaint  Patient presents with  . has knot on left breast    just had mammogram was normal,  just came up yesterday     HPI: 74 y.o. year old AA female has her screening mammograms performed at [facility]. In epic the last mammogram report I see is 09/10/2013 at which time mammogram was negative. However patient states that she has gone back to [facility] and had a mammogram again for this year already and that that was negative.  Patient states that she just felt this mass in her left breast yesterday. Says that she had not felt this prior and was new on her self-exam yesterday.  No other complaints or concerns today.     Home Meds:   Outpatient Prescriptions Prior to Visit  Medication Sig Dispense Refill  . ACCU-CHEK SOFTCLIX LANCETS lancets Use as instructed 100 each 12  . allopurinol (ZYLOPRIM) 300 MG tablet Take 1 tablet (300 mg total) by mouth daily. 90 tablet 1  . aspirin 81 MG tablet Take 81 mg by mouth daily.      . Blood Glucose Monitoring Suppl (ACCU-CHEK AVIVA PLUS) W/DEVICE KIT Dx - 250.00 1 kit 0  . calcitRIOL (ROCALTROL) 0.25 MCG capsule Take 0.25 mcg by mouth daily.    . cetirizine (ZYRTEC) 10 MG tablet Take 1 tablet (10 mg total) by mouth daily. 30 tablet 11  . cloNIDine (CATAPRES) 0.1 MG tablet Take 1 tablet (0.1 mg total) by mouth 2 (two) times daily. 180 tablet 3  . diltiazem (CARDIZEM CD) 300 MG 24 hr capsule Take 1 capsule (300 mg total) by mouth daily. 90 capsule 3  . esomeprazole (NEXIUM) 40 MG capsule Take 1 capsule (40 mg total) by mouth 2 (two) times daily before a meal. 180 capsule 1  . ferrous fumarate (FERRO-SEQUELS) 50 MG CR tablet Take 50 mg by mouth daily.      . furosemide (LASIX) 40 MG tablet TAKE 2 TABLETS IN THE MORNING AND 1 TABLET IN THE EVENING. 270 tablet 3  . glimepiride (AMARYL) 4 MG tablet Take 1 tablet (4  mg total) by mouth daily. 90 tablet 3  . glucose blood (ACCU-CHEK AVIVA PLUS) test strip Checks BS 4-5 times per day, QAM, Q AC Meals, QHS - DX-250.00 100 each 12  . insulin aspart (NOVOLOG) 100 UNIT/ML FlexPen Inject 10 Units into the skin daily with lunch. 15 mL 3  . Insulin Glargine (LANTUS) 100 UNIT/ML Solostar Pen Inject 38 Units into the skin daily. 15 pen 3  . Insulin Pen Needle 31G X 5 MM MISC As directed for insulin use 300 each 3  . levothyroxine (SYNTHROID, LEVOTHROID) 50 MCG tablet Take 1 tablet (50 mcg total) by mouth daily. 90 tablet 3  . metoprolol succinate (TOPROL-XL) 25 MG 24 hr tablet TAKE (1) TABLET DAILY. 90 tablet 3  . mometasone (ELOCON) 0.1 % ointment APPLY TOPICALLY DAILY. 45 g 3  . pravastatin (PRAVACHOL) 80 MG tablet TAKE ONE TABLET AT BEDTIME. 90 tablet 1  . sodium bicarbonate 650 MG tablet Take 1 tablet (650 mg total) by mouth 4 (four) times daily. 360 tablet 1  . sucralfate (CARAFATE) 1 GM/10ML suspension Take 10 mLs (1 g total) by mouth 4 (four) times daily -  with meals and at bedtime. 420 mL 5  . UNABLE TO FIND Takes a potassium suspension daily    .  valsartan (DIOVAN) 160 MG tablet Take 160 mg by mouth daily.      . insulin detemir (LEVEMIR) 100 UNIT/ML injection Inject 0.38 mLs (38 Units total) into the skin daily. 10 mL 3  . sucralfate (CARAFATE) 1 G tablet Take 1 tablet (1 g total) by mouth 4 (four) times daily -  with meals and at bedtime. 120 tablet 0  . amoxicillin (AMOXIL) 875 MG tablet Take 1 tablet (875 mg total) by mouth 2 (two) times daily. 20 tablet 0   No facility-administered medications prior to visit.    Allergies:  Allergies  Allergen Reactions  . Ace Inhibitors   . Actos [Pioglitazone Hydrochloride]   . Dust Mite Extract   . Dye Fdc Red [Erythrosine Red No. 3]       Review of Systems: See HPI for pertinent ROS. All other ROS negative.    Physical Exam: Blood pressure 128/74, pulse 68, temperature 98.5 F (36.9 C), temperature  source Oral, resp. rate 18, weight 256 lb (116.121 kg)., Body mass index is 45.36 kg/(m^2). General:  AAF. Appears in no acute distress. Neck: Supple. No thyromegaly. No lymphadenopathy. Lungs: Clear bilaterally to auscultation without wheezes, rales, or rhonchi. Breathing is unlabored. Heart: Regular rhythm. No murmurs, rubs, or gallops. Breast: Left Breast: At 7 O'Clock position--there is an approx 1 cm diameter firm mass. This is not tender with palpation.  It is superficial--just under the skin and does not feel like it is adhered to underlying tissue.  Msk:  Strength and tone normal for age. Extremities/Skin: Warm and dry. No clubbing or cyanosis. No edema. No rashes or suspicious lesions. Neuro: Alert and oriented X 3. Moves all extremities spontaneously. Gait is normal. CNII-XII grossly in tact. Psych:  Responds to questions appropriately with a normal affect.     ASSESSMENT AND PLAN:  74 y.o. year old female with  1. Mass of left breast Exam is c/w superficial cyst but will obtain ultrasound to confirm.  She is African American with dark brown skin so difficult to assess for erythema. But no sign of erythema. No warmth. No tenderness. Not c/w abscess/boil.  - MM Digital Diagnostic Unilat L; Future - US BREAST COMPLETE UNI LEFT INC AXILLA; Future   Signed, Olean Ree [place], [place], [facility] 10/24/2014 2:25 PM

## 2014-10-25 ENCOUNTER — Encounter: Payer: Self-pay | Admitting: Gastroenterology

## 2014-11-12 ENCOUNTER — Other Ambulatory Visit: Payer: Self-pay | Admitting: Family Medicine

## 2014-11-12 NOTE — Telephone Encounter (Signed)
Pt called and asked what pharmacy she wanted Nexium to go to  Last time was sent to mail order.  She wants mail order, will contact them.  Disregard local pharmacy request.

## 2014-11-15 ENCOUNTER — Other Ambulatory Visit: Payer: Self-pay | Admitting: Family Medicine

## 2014-11-15 ENCOUNTER — Encounter: Payer: Self-pay | Admitting: Family Medicine

## 2014-11-15 NOTE — Telephone Encounter (Signed)
Refill appropriate and filled per protocol. 

## 2014-11-28 ENCOUNTER — Telehealth: Payer: Self-pay | Admitting: *Deleted

## 2014-11-28 NOTE — Telephone Encounter (Signed)
Submitted humana referral thru acuity connect for authorization to Dr. Ronald Lobo, MD at [facility] with authorization number [id number]  Requesting provider:Warren Pickard,MD  Treating provider:Robert Buccini,MD  Number of visits:6  Start Date:01/09/15  End Date:07/08/15  Dx: R10.13-Epigastric pain  Copy has been faxed to [facility] for records/review

## 2014-12-19 ENCOUNTER — Other Ambulatory Visit: Payer: Self-pay | Admitting: Family Medicine

## 2014-12-19 NOTE — Telephone Encounter (Signed)
Refill appropriate and filled per protocol. 

## 2014-12-26 ENCOUNTER — Emergency Department (HOSPITAL_COMMUNITY)
Admission: EM | Admit: 2014-12-26 | Discharge: 2014-12-26 | Disposition: A | Discharge status: Home / Self Care | Payer: PPO | Attending: Emergency Medicine | Admitting: Emergency Medicine

## 2014-12-26 ENCOUNTER — Encounter (HOSPITAL_COMMUNITY): Payer: Self-pay | Admitting: Emergency Medicine

## 2014-12-26 DIAGNOSIS — N189 Chronic kidney disease, unspecified: Secondary | ICD-10-CM | POA: Diagnosis not present

## 2014-12-26 DIAGNOSIS — K219 Gastro-esophageal reflux disease without esophagitis: Secondary | ICD-10-CM | POA: Insufficient documentation

## 2014-12-26 DIAGNOSIS — I129 Hypertensive chronic kidney disease with stage 1 through stage 4 chronic kidney disease, or unspecified chronic kidney disease: Secondary | ICD-10-CM | POA: Diagnosis not present

## 2014-12-26 DIAGNOSIS — I2510 Atherosclerotic heart disease of native coronary artery without angina pectoris: Secondary | ICD-10-CM | POA: Insufficient documentation

## 2014-12-26 DIAGNOSIS — M79671 Pain in right foot: Secondary | ICD-10-CM | POA: Diagnosis present

## 2014-12-26 DIAGNOSIS — E780 Pure hypercholesterolemia: Secondary | ICD-10-CM | POA: Diagnosis not present

## 2014-12-26 DIAGNOSIS — Z7952 Long term (current) use of systemic steroids: Secondary | ICD-10-CM | POA: Diagnosis not present

## 2014-12-26 DIAGNOSIS — Z87891 Personal history of nicotine dependence: Secondary | ICD-10-CM | POA: Insufficient documentation

## 2014-12-26 DIAGNOSIS — E119 Type 2 diabetes mellitus without complications: Secondary | ICD-10-CM | POA: Diagnosis not present

## 2014-12-26 DIAGNOSIS — Z7982 Long term (current) use of aspirin: Secondary | ICD-10-CM | POA: Diagnosis not present

## 2014-12-26 DIAGNOSIS — Z79899 Other long term (current) drug therapy: Secondary | ICD-10-CM | POA: Diagnosis not present

## 2014-12-26 DIAGNOSIS — M10071 Idiopathic gout, right ankle and foot: Secondary | ICD-10-CM | POA: Insufficient documentation

## 2014-12-26 DIAGNOSIS — M109 Gout, unspecified: Secondary | ICD-10-CM

## 2014-12-26 DIAGNOSIS — Z86010 Personal history of colonic polyps: Secondary | ICD-10-CM | POA: Diagnosis not present

## 2014-12-26 DIAGNOSIS — D649 Anemia, unspecified: Secondary | ICD-10-CM | POA: Diagnosis not present

## 2014-12-26 DIAGNOSIS — Z794 Long term (current) use of insulin: Secondary | ICD-10-CM | POA: Insufficient documentation

## 2014-12-26 MED ORDER — HYDROCODONE-ACETAMINOPHEN 5-325 MG PO TABS: 2.0000 | ORAL_TABLET | ORAL | Status: DC | PRN

## 2014-12-26 MED ORDER — HYDROCODONE-ACETAMINOPHEN 5-325 MG PO TABS: 2.0000 | ORAL_TABLET | Freq: Once | ORAL | Status: DC

## 2014-12-26 NOTE — ED Notes (Signed)
Discharge papewrork and prescriptions reviewed with pt. The importance of follow up care was reinforced.

## 2014-12-26 NOTE — ED Notes (Signed)
Gout pain

## 2014-12-26 NOTE — ED Notes (Signed)
Pt was recently seen at the foot doctor last week and given a boot and a cortisone (patient thinks) shot in the right foot for gout and arthritis in the ankle. Started on gout medication last week but says her foot swelling and medial foot pain on the right foot has not improved. No other c/c.

## 2014-12-26 NOTE — ED Provider Notes (Signed)
CSN: [id number]     Arrival date & time 12/26/14  1907 History   First MD Initiated Contact with Patient 12/26/14 2008     Chief Complaint  Patient presents with  . Foot Pain  . Gout     (Consider location/radiation/quality/duration/timing/severity/associated sxs/prior Treatment) Patient is a 74 y.o. female presenting with lower extremity pain. The history is provided by the patient. No language interpreter was used.  Foot Pain This is a recurrent problem. The current episode started in the past 7 days. The problem occurs constantly. The problem has been gradually worsening. Associated symptoms include joint swelling. Nothing aggravates the symptoms. She has tried nothing for the symptoms. The treatment provided moderate relief.   Pt reports she has gout.  Her Md has given a cortisone injection and started her on allopurinol.  Pt reports increasing pain Past Medical History  Diagnosis Date  . High cholesterol   . Diabetes mellitus   . Hypertension   . Anemia   . CAD (coronary artery disease)   . Pancreatitis   . Colon polyps   . Proteinuria   . DDD (degenerative disc disease)   . CRI (chronic renal insufficiency)   . GERD (gastroesophageal reflux disease)    Past Surgical History  Procedure Laterality Date  . Back surgery    . Ankle surgery    . Tonsillectomy    . Angioplasty     History reviewed. No pertinent family history. History  Substance Use Topics  . Smoking status: Former Research scientist (life sciences)  . Smokeless tobacco: Not on file  . Alcohol Use: No   OB History    No data available     Review of Systems  Musculoskeletal: Positive for joint swelling.  All other systems reviewed and are negative.     Allergies  Ace inhibitors; Actos; Dust mite extract; and Dye fdc red  Home Medications   Prior to Admission medications   Medication Sig Start Date End Date Taking? Authorizing Provider  ACCU-CHEK SOFTCLIX LANCETS lancets Use as instructed 04/08/14   Susy Frizzle, MD   allopurinol (ZYLOPRIM) 300 MG tablet TAKE 1 TABLET ONCE DAILY. 12/19/14   Susy Frizzle, MD  aspirin 81 MG tablet Take 81 mg by mouth daily.      Historical Provider, MD  Blood Glucose Monitoring Suppl (ACCU-CHEK AVIVA PLUS) W/DEVICE KIT Dx - 250.00 04/08/14   Susy Frizzle, MD  calcitRIOL (ROCALTROL) 0.25 MCG capsule Take 0.25 mcg by mouth daily.    Historical Provider, MD  cetirizine (ZYRTEC) 10 MG tablet Take 1 tablet (10 mg total) by mouth daily. 12/04/13   Susy Frizzle, MD  cloNIDine (CATAPRES) 0.1 MG tablet Take 1 tablet (0.1 mg total) by mouth 2 (two) times daily. 07/19/14   Susy Frizzle, MD  diltiazem (CARDIZEM CD) 300 MG 24 hr capsule Take 1 capsule (300 mg total) by mouth daily. 07/19/14   Susy Frizzle, MD  esomeprazole (NEXIUM) 40 MG capsule TAKE 1 CAPSULE TWICE DAILY BEFORE MEALS 11/15/14   Susy Frizzle, MD  ferrous fumarate (FERRO-SEQUELS) 50 MG CR tablet Take 50 mg by mouth daily.      Historical Provider, MD  furosemide (LASIX) 40 MG tablet TAKE 2 TABLETS IN THE MORNING AND 1 TABLET IN THE EVENING. 07/19/14   Susy Frizzle, MD  glimepiride (AMARYL) 4 MG tablet Take 1 tablet (4 mg total) by mouth daily. 07/19/14   Susy Frizzle, MD  glucose blood (ACCU-CHEK AVIVA PLUS) test strip Checks  BS 4-5 times per day, QAM, Q AC Meals, QHS - DX-250.00 04/08/14   Susy Frizzle, MD  HYDROcodone-acetaminophen (NORCO/VICODIN) 5-325 MG per tablet Take 2 tablets by mouth every 4 (four) hours as needed. 12/26/14   Fransico Meadow, PA-C  insulin aspart (NOVOLOG) 100 UNIT/ML FlexPen Inject 10 Units into the skin daily with lunch. 07/24/14   Susy Frizzle, MD  Insulin Glargine (LANTUS) 100 UNIT/ML Solostar Pen Inject 38 Units into the skin daily. 07/24/14   Susy Frizzle, MD  Insulin Pen Needle 31G X 5 MM MISC As directed for insulin use 07/24/14   Susy Frizzle, MD  levothyroxine (SYNTHROID, LEVOTHROID) 50 MCG tablet Take 1 tablet (50 mcg total) by mouth daily. 07/19/14   Susy Frizzle, MD  metoprolol succinate (TOPROL-XL) 25 MG 24 hr tablet TAKE (1) TABLET DAILY. 07/19/14   Susy Frizzle, MD  mometasone (ELOCON) 0.1 % ointment APPLY TOPICALLY DAILY. 04/25/14   Susy Frizzle, MD  pravastatin (PRAVACHOL) 80 MG tablet TAKE ONE TABLET AT BEDTIME. 07/19/14   Susy Frizzle, MD  sodium bicarbonate 650 MG tablet Take 1 tablet (650 mg total) by mouth 4 (four) times daily. 07/19/14   Susy Frizzle, MD  sucralfate (CARAFATE) 1 G tablet Take 1 tablet (1 g total) by mouth 4 (four) times daily -  with meals and at bedtime. 07/11/14   Susy Frizzle, MD  sucralfate (CARAFATE) 1 GM/10ML suspension Take 10 mLs (1 g total) by mouth 4 (four) times daily -  with meals and at bedtime. 07/02/14   Susy Frizzle, MD  UNABLE TO FIND Takes a potassium suspension daily    Historical Provider, MD  valsartan (DIOVAN) 160 MG tablet Take 160 mg by mouth daily.      Historical Provider, MD   BP 135/69 mmHg  Pulse 97  Temp(Src) 99 F (37.2 C) (Oral)  Resp 18  SpO2 97% Physical Exam  Constitutional: She is oriented to person, place, and time. She appears well-developed and well-nourished.  HENT:  Head: Normocephalic.  Eyes: EOM are normal.  Neck: Normal range of motion.  Pulmonary/Chest: Effort normal.  Abdominal: She exhibits no distension.  Musculoskeletal: Normal range of motion.  Neurological: She is alert and oriented to person, place, and time.  Skin: There is erythema.  Tender right 1st toe,  Slight swelling, painful to touch  nv and ns intact  Psychiatric: She has a normal mood and affect.  Nursing note and vitals reviewed.   ED Course  Procedures (including critical care time) Labs Review Labs Reviewed - No data to display  Imaging Review No results found.   EKG Interpretation None      MDM   Final diagnoses:  Gouty arthritis of toe of right foot    Hydrocodone     Fransico Meadow, PA-C 12/26/14 2045  Tanna Furry, MD 01/03/15 1354

## 2014-12-26 NOTE — Discharge Instructions (Signed)

## 2015-01-28 ENCOUNTER — Other Ambulatory Visit: Payer: Self-pay | Admitting: Family Medicine

## 2015-01-28 MED ORDER — PRAVASTATIN SODIUM 80 MG PO TABS: ORAL_TABLET | ORAL | Status: DC

## 2015-01-28 NOTE — Telephone Encounter (Signed)
Medication refilled per protocol. 

## 2015-02-21 ENCOUNTER — Other Ambulatory Visit: Payer: Self-pay | Admitting: Gastroenterology

## 2015-03-27 ENCOUNTER — Ambulatory Visit (INDEPENDENT_AMBULATORY_CARE_PROVIDER_SITE_OTHER): Payer: PPO | Admitting: *Deleted

## 2015-03-27 DIAGNOSIS — Z23 Encounter for immunization: Secondary | ICD-10-CM

## 2015-04-10 LAB — HM DIABETES EYE EXAM

## 2015-05-16 ENCOUNTER — Encounter: Payer: Self-pay | Admitting: Family Medicine

## 2015-05-16 ENCOUNTER — Ambulatory Visit (INDEPENDENT_AMBULATORY_CARE_PROVIDER_SITE_OTHER): Payer: PPO | Admitting: Family Medicine

## 2015-05-16 VITALS — BP 162/80 | HR 76 | Temp 98.1°F | Resp 18 | Ht 63.0 in | Wt 254.0 lb

## 2015-05-16 DIAGNOSIS — N61 Mastitis without abscess: Secondary | ICD-10-CM | POA: Diagnosis not present

## 2015-05-16 MED ORDER — DOXYCYCLINE HYCLATE 100 MG PO TABS: 100.0000 mg | ORAL_TABLET | Freq: Two times a day (BID) | ORAL | Status: DC

## 2015-05-16 NOTE — Progress Notes (Signed)
Subjective:    Patient ID: Emily Hughes, female    DOB: [birth year], 74 y.o.   MRN: [id number]  HPI Patient reports a one-week history of painful nodules on both breasts. On the left breast there is a red hot tender area at 5:00 compared to the nipple. On the right breast there is a similar red hot and painful indurated area at 7 to 8:00 when compared to the nipple. She has a history of boils. Both areas are indurated red firm and painful. However they're not fluctuant. I do not believe that these require incision and drainage but they do appear to require antibiotics Past Medical History  Diagnosis Date  . High cholesterol   . Diabetes mellitus   . Hypertension   . Anemia   . CAD (coronary artery disease)   . Pancreatitis   . Colon polyps   . Proteinuria   . DDD (degenerative disc disease)   . CRI (chronic renal insufficiency)   . GERD (gastroesophageal reflux disease)    Past Surgical History  Procedure Laterality Date  . Back surgery    . Ankle surgery    . Tonsillectomy    . Angioplasty     Current Outpatient Prescriptions on File Prior to Visit  Medication Sig Dispense Refill  . ACCU-CHEK SOFTCLIX LANCETS lancets Use as instructed 100 each 12  . allopurinol (ZYLOPRIM) 300 MG tablet TAKE 1 TABLET ONCE DAILY. 90 tablet 2  . aspirin 81 MG tablet Take 81 mg by mouth daily.      . Blood Glucose Monitoring Suppl (ACCU-CHEK AVIVA PLUS) W/DEVICE KIT Dx - 250.00 1 kit 0  . calcitRIOL (ROCALTROL) 0.25 MCG capsule Take 0.25 mcg by mouth daily.    . cetirizine (ZYRTEC) 10 MG tablet Take 1 tablet (10 mg total) by mouth daily. 30 tablet 11  . cloNIDine (CATAPRES) 0.1 MG tablet Take 1 tablet (0.1 mg total) by mouth 2 (two) times daily. 180 tablet 3  . diltiazem (CARDIZEM CD) 300 MG 24 hr capsule Take 1 capsule (300 mg total) by mouth daily. 90 capsule 3  . esomeprazole (NEXIUM) 40 MG capsule TAKE 1 CAPSULE TWICE DAILY BEFORE MEALS 60 capsule 3  . ferrous fumarate (FERRO-SEQUELS) 50 MG  CR tablet Take 50 mg by mouth daily.      . furosemide (LASIX) 40 MG tablet TAKE 2 TABLETS IN THE MORNING AND 1 TABLET IN THE EVENING. 270 tablet 3  . glimepiride (AMARYL) 4 MG tablet Take 1 tablet (4 mg total) by mouth daily. 90 tablet 3  . glucose blood (ACCU-CHEK AVIVA PLUS) test strip Checks BS 4-5 times per day, QAM, Q AC Meals, QHS - DX-250.00 100 each 12  . HYDROcodone-acetaminophen (NORCO/VICODIN) 5-325 MG per tablet Take 2 tablets by mouth every 4 (four) hours as needed. 20 tablet 0  . insulin aspart (NOVOLOG) 100 UNIT/ML FlexPen Inject 10 Units into the skin daily with lunch. 15 mL 3  . Insulin Glargine (LANTUS) 100 UNIT/ML Solostar Pen Inject 38 Units into the skin daily. 15 pen 3  . Insulin Pen Needle 31G X 5 MM MISC As directed for insulin use 300 each 3  . levothyroxine (SYNTHROID, LEVOTHROID) 50 MCG tablet Take 1 tablet (50 mcg total) by mouth daily. 90 tablet 3  . metoprolol succinate (TOPROL-XL) 25 MG 24 hr tablet TAKE (1) TABLET DAILY. 90 tablet 3  . mometasone (ELOCON) 0.1 % ointment APPLY TOPICALLY DAILY. 45 g 3  . pravastatin (PRAVACHOL) 80 MG tablet TAKE ONE TABLET  AT BEDTIME. 90 tablet 1  . sodium bicarbonate 650 MG tablet Take 1 tablet (650 mg total) by mouth 4 (four) times daily. 360 tablet 1  . sucralfate (CARAFATE) 1 G tablet Take 1 tablet (1 g total) by mouth 4 (four) times daily -  with meals and at bedtime. 120 tablet 0  . sucralfate (CARAFATE) 1 GM/10ML suspension Take 10 mLs (1 g total) by mouth 4 (four) times daily -  with meals and at bedtime. 420 mL 5  . UNABLE TO FIND Takes a potassium suspension daily    . valsartan (DIOVAN) 160 MG tablet Take 160 mg by mouth daily.       No current facility-administered medications on file prior to visit.   Allergies  Allergen Reactions  . Ace Inhibitors   . Actos [Pioglitazone Hydrochloride]   . Dust Mite Extract   . Dye Fdc Red [Erythrosine Red No. 3]    Social History   Social History  . Marital Status: Widowed      Spouse Name: N/A  . Number of Children: N/A  . Years of Education: N/A   Occupational History  . Not on file.   Social History Main Topics  . Smoking status: Former Research scientist (life sciences)  . Smokeless tobacco: Not on file  . Alcohol Use: No  . Drug Use: No  . Sexual Activity: Not on file   Other Topics Concern  . Not on file   Social History Narrative      Review of Systems  All other systems reviewed and are negative.      Objective:   Physical Exam  Cardiovascular: Normal rate, regular rhythm and normal heart sounds.   Pulmonary/Chest: Effort normal and breath sounds normal.  Vitals reviewed.  See history of present illness       Assessment & Plan:  Cellulitis of breast - Plan: doxycycline (VIBRA-TABS) 100 MG tablet, DISCONTINUED: doxycycline (VIBRA-TABS) 100 MG tablet  Begin doxycycline 100 mg by mouth twice a day for 10 days. Patient cannot use Bactrim due to her kidney problems. She does have an allergy to red dye #3. There is some distant cross-reactivity between this and doxycycline. I warned the patient about this and told her that if she develops any kind of rash to stop the antibiotic immediately and to notify me. Recheck in one week if not 100% better

## 2015-05-20 ENCOUNTER — Encounter: Payer: Self-pay | Admitting: Podiatry

## 2015-05-20 ENCOUNTER — Ambulatory Visit (INDEPENDENT_AMBULATORY_CARE_PROVIDER_SITE_OTHER): Payer: PPO | Admitting: Podiatry

## 2015-05-20 VITALS — BP 145/74 | HR 69 | Resp 12

## 2015-05-20 DIAGNOSIS — E119 Type 2 diabetes mellitus without complications: Secondary | ICD-10-CM

## 2015-05-20 NOTE — Patient Instructions (Signed)
Today your diabetic foot examination demonstrated adequate circulation and, feeling in your feet. I recommended she wear a walking style shoe such as a sketcher or new balance with stiffness or around the heel Return once he year for diabetic foot screen or as needed  Diabetes and Foot Care Diabetes may cause you to have problems because of poor blood supply (circulation) to your feet and legs. This may cause the skin on your feet to become thinner, break easier, and heal more slowly. Your skin may become dry, and the skin may peel and crack. You may also have nerve damage in your legs and feet causing decreased feeling in them. You may not notice minor injuries to your feet that could lead to infections or more serious problems. Taking care of your feet is one of the most important things you can do for yourself.  HOME CARE INSTRUCTIONS  Wear shoes at all times, even in the house. Do not go barefoot. Bare feet are easily injured.  Check your feet daily for blisters, cuts, and redness. If you cannot see the bottom of your feet, use a mirror or ask someone for help.  Wash your feet with warm water (do not use hot water) and mild soap. Then pat your feet and the areas between your toes until they are completely dry. Do not soak your feet as this can dry your skin.  Apply a moisturizing lotion or petroleum jelly (that does not contain alcohol and is unscented) to the skin on your feet and to dry, brittle toenails. Do not apply lotion between your toes.  Trim your toenails straight across. Do not dig under them or around the cuticle. File the edges of your nails with an emery board or nail file.  Do not cut corns or calluses or try to remove them with medicine.  Wear clean socks or stockings every day. Make sure they are not too tight. Do not wear knee-high stockings since they may decrease blood flow to your legs.  Wear shoes that fit properly and have enough cushioning. To break in new shoes,  wear them for just a few hours a day. This prevents you from injuring your feet. Always look in your shoes before you put them on to be sure there are no objects inside.  Do not cross your legs. This may decrease the blood flow to your feet.  If you find a minor scrape, cut, or break in the skin on your feet, keep it and the skin around it clean and dry. These areas may be cleansed with mild soap and water. Do not cleanse the area with peroxide, alcohol, or iodine.  When you remove an adhesive bandage, be sure not to damage the skin around it.  If you have a wound, look at it several times a day to make sure it is healing.  Do not use heating pads or hot water bottles. They may burn your skin. If you have lost feeling in your feet or legs, you may not know it is happening until it is too late.  Make sure your health care provider performs a complete foot exam at least annually or more often if you have foot problems. Report any cuts, sores, or bruises to your health care provider immediately. SEEK MEDICAL CARE IF:   You have an injury that is not healing.  You have cuts or breaks in the skin.  You have an ingrown nail.  You notice redness on your legs or feet.  You feel burning or tingling in your legs or feet.  You have pain or cramps in your legs and feet.  Your legs or feet are numb.  Your feet always feel cold. SEEK IMMEDIATE MEDICAL CARE IF:   There is increasing redness, swelling, or pain in or around a wound.  There is a red line that goes up your leg.  Pus is coming from a wound.  You develop a fever or as directed by your health care provider.  You notice a bad smell coming from an ulcer or wound.   This information is not intended to replace advice given to you by your health care provider. Make sure you discuss any questions you have with your health care provider.   Document Released: 06/18/2000 Document Revised: 02/21/2013 Document Reviewed:  11/28/2012 Elsevier Interactive Patient Education [employer].

## 2015-05-20 NOTE — Progress Notes (Signed)
   Subjective:    Patient ID: Emily Hughes, female    DOB: [birth year], 74 y.o.   MRN: [id number]  HPI   This patient presents today complaining of diffuse discomfort over 2 year period with gradually increasing symptoms on the plantar aspect of the right and left feet. The symptoms are aggravated with standing walking relieved with rest. Patient says that she does not wear appropriate shoes such as a relatively new running or walking shoe. She said in the past 2 years she had diabetic shoes which reduced some of her symptoms, however, she has not worn the shoes recently. She is requesting evaluation for diabetes and diabetic shoes  She denies any history of ulceration, claudication or amputation  Review of Systems  Musculoskeletal: Positive for back pain, joint swelling and gait problem.  Skin: Positive for color change.       Objective:   Physical Exam Orientated 3  Vascular: No peripheral edema bilaterally DP and PT pulses 2/4 bilaterally Capillary reflex immediate bilaterally  Neurological: Sensation to 10 g monofilament wire intact 5/5 bilaterally Vibratory sensation reactive bilaterally Ankle reflex equal and reactive bilaterally  Dermatological: Texture and turgor within normal limits bilaterally No skin lesions noted bilaterally No open skin lesions bilaterally  Musculoskeletal: Pes planus bilaterally Palpation in the plantar fascial areas bilaterally elicits no discomfort HAV deformity right There is no restriction ankle, subtalar, midtarsal joints, bilaterally       Assessment & Plan:   Assessment: Satisfactory neurovascular status Diabetic without complications Pes planus with some symptoms upon weight-bearing  Plan: Today review the results of the examination with patient today. I made aware that she would not qualify for diabetic shoes. I did recommend that she purchase a sturdy pair of walking shoes to reduce some of the general discomfort she has  upon weight-bearing  Reappoint as needed or yearly intervals

## 2015-06-12 ENCOUNTER — Encounter (HOSPITAL_COMMUNITY): Payer: Self-pay | Admitting: Emergency Medicine

## 2015-06-12 ENCOUNTER — Inpatient Hospital Stay (HOSPITAL_COMMUNITY)
Admission: EM | Admit: 2015-06-12 | Discharge: 2015-06-14 | DRG: 100 | Disposition: A | Discharge status: Home Health | Payer: PPO | Attending: Internal Medicine | Admitting: Internal Medicine

## 2015-06-12 ENCOUNTER — Observation Stay (HOSPITAL_COMMUNITY): Payer: PPO

## 2015-06-12 ENCOUNTER — Emergency Department (HOSPITAL_COMMUNITY): Payer: PPO

## 2015-06-12 ENCOUNTER — Inpatient Hospital Stay (HOSPITAL_COMMUNITY): Payer: PPO

## 2015-06-12 DIAGNOSIS — N184 Chronic kidney disease, stage 4 (severe): Secondary | ICD-10-CM

## 2015-06-12 DIAGNOSIS — Z794 Long term (current) use of insulin: Secondary | ICD-10-CM

## 2015-06-12 DIAGNOSIS — Z888 Allergy status to other drugs, medicaments and biological substances status: Secondary | ICD-10-CM | POA: Diagnosis not present

## 2015-06-12 DIAGNOSIS — E785 Hyperlipidemia, unspecified: Secondary | ICD-10-CM | POA: Diagnosis present

## 2015-06-12 DIAGNOSIS — G8929 Other chronic pain: Secondary | ICD-10-CM | POA: Diagnosis present

## 2015-06-12 DIAGNOSIS — Z87891 Personal history of nicotine dependence: Secondary | ICD-10-CM

## 2015-06-12 DIAGNOSIS — Z79899 Other long term (current) drug therapy: Secondary | ICD-10-CM | POA: Diagnosis not present

## 2015-06-12 DIAGNOSIS — E1310 Other specified diabetes mellitus with ketoacidosis without coma: Secondary | ICD-10-CM | POA: Diagnosis present

## 2015-06-12 DIAGNOSIS — M549 Dorsalgia, unspecified: Secondary | ICD-10-CM | POA: Diagnosis present

## 2015-06-12 DIAGNOSIS — Z7984 Long term (current) use of oral hypoglycemic drugs: Secondary | ICD-10-CM | POA: Diagnosis not present

## 2015-06-12 DIAGNOSIS — I2510 Atherosclerotic heart disease of native coronary artery without angina pectoris: Secondary | ICD-10-CM | POA: Diagnosis present

## 2015-06-12 DIAGNOSIS — Z7982 Long term (current) use of aspirin: Secondary | ICD-10-CM | POA: Diagnosis not present

## 2015-06-12 DIAGNOSIS — E7800 Pure hypercholesterolemia, unspecified: Secondary | ICD-10-CM

## 2015-06-12 DIAGNOSIS — E1122 Type 2 diabetes mellitus with diabetic chronic kidney disease: Secondary | ICD-10-CM | POA: Diagnosis present

## 2015-06-12 DIAGNOSIS — Z6841 Body Mass Index (BMI) 40.0 and over, adult: Secondary | ICD-10-CM

## 2015-06-12 DIAGNOSIS — R809 Proteinuria, unspecified: Secondary | ICD-10-CM

## 2015-06-12 DIAGNOSIS — I10 Essential (primary) hypertension: Secondary | ICD-10-CM | POA: Diagnosis present

## 2015-06-12 DIAGNOSIS — N189 Chronic kidney disease, unspecified: Secondary | ICD-10-CM

## 2015-06-12 DIAGNOSIS — K219 Gastro-esophageal reflux disease without esophagitis: Secondary | ICD-10-CM | POA: Diagnosis present

## 2015-06-12 DIAGNOSIS — Z91048 Other nonmedicinal substance allergy status: Secondary | ICD-10-CM | POA: Diagnosis not present

## 2015-06-12 DIAGNOSIS — R569 Unspecified convulsions: Secondary | ICD-10-CM | POA: Diagnosis present

## 2015-06-12 DIAGNOSIS — E119 Type 2 diabetes mellitus without complications: Secondary | ICD-10-CM | POA: Diagnosis present

## 2015-06-12 DIAGNOSIS — E1110 Type 2 diabetes mellitus with ketoacidosis without coma: Secondary | ICD-10-CM | POA: Diagnosis present

## 2015-06-12 DIAGNOSIS — G40909 Epilepsy, unspecified, not intractable, without status epilepticus: Secondary | ICD-10-CM | POA: Diagnosis present

## 2015-06-12 DIAGNOSIS — D72829 Elevated white blood cell count, unspecified: Secondary | ICD-10-CM | POA: Diagnosis not present

## 2015-06-12 DIAGNOSIS — E039 Hypothyroidism, unspecified: Secondary | ICD-10-CM | POA: Diagnosis present

## 2015-06-12 LAB — COMPREHENSIVE METABOLIC PANEL
ALT: 17 U/L (ref 14–54)
ANION GAP: 18 — AB (ref 5–15)
AST: 22 U/L (ref 15–41)
Albumin: 3.6 g/dL (ref 3.5–5.0)
Alkaline Phosphatase: 92 U/L (ref 38–126)
BUN: 52 mg/dL — ABNORMAL HIGH (ref 6–20)
CALCIUM: 9.8 mg/dL (ref 8.9–10.3)
CHLORIDE: 101 mmol/L (ref 101–111)
CO2: 17 mmol/L — ABNORMAL LOW (ref 22–32)
CREATININE: 2.8 mg/dL — AB (ref 0.44–1.00)
GFR, EST AFRICAN AMERICAN: 18 mL/min — AB (ref >60)
GFR, EST NON AFRICAN AMERICAN: 16 mL/min — AB (ref >60)
Glucose, Bld: 441 mg/dL — ABNORMAL HIGH (ref 65–99)
Potassium: 3.5 mmol/L (ref 3.5–5.1)
SODIUM: 136 mmol/L (ref 135–145)
Total Bilirubin: 0.6 mg/dL (ref 0.3–1.2)
Total Protein: 7.9 g/dL (ref 6.5–8.1)

## 2015-06-12 LAB — BASIC METABOLIC PANEL
ANION GAP: 7 (ref 5–15)
ANION GAP: 8 (ref 5–15)
BUN: 47 mg/dL — ABNORMAL HIGH (ref 6–20)
BUN: 48 mg/dL — ABNORMAL HIGH (ref 6–20)
CALCIUM: 8.9 mg/dL (ref 8.9–10.3)
CHLORIDE: 105 mmol/L (ref 101–111)
CO2: 26 mmol/L (ref 22–32)
CO2: 25 mmol/L (ref 22–32)
Calcium: 9.4 mg/dL (ref 8.9–10.3)
Chloride: 107 mmol/L (ref 101–111)
Creatinine, Ser: 2.51 mg/dL — ABNORMAL HIGH (ref 0.44–1.00)
Creatinine, Ser: 2.56 mg/dL — ABNORMAL HIGH (ref 0.44–1.00)
GFR calc non Af Amer: 18 mL/min — ABNORMAL LOW (ref >60)
GFR, EST AFRICAN AMERICAN: 21 mL/min — AB (ref >60)
GFR, EST AFRICAN AMERICAN: 20 mL/min — AB (ref >60)
GFR, EST NON AFRICAN AMERICAN: 18 mL/min — AB (ref >60)
GLUCOSE: 131 mg/dL — AB (ref 65–99)
Glucose, Bld: 161 mg/dL — ABNORMAL HIGH (ref 65–99)
POTASSIUM: 5.2 mmol/L — AB (ref 3.5–5.1)
Potassium: 4.4 mmol/L (ref 3.5–5.1)
Sodium: 138 mmol/L (ref 135–145)
Sodium: 140 mmol/L (ref 135–145)

## 2015-06-12 LAB — PROTIME-INR
INR: 1.2 (ref 0.00–1.49)
Prothrombin Time: 15.3 seconds — ABNORMAL HIGH (ref 11.6–15.2)

## 2015-06-12 LAB — CBC
HEMATOCRIT: 30.9 % — AB (ref 36.0–46.0)
HEMATOCRIT: 29.7 % — AB (ref 36.0–46.0)
HEMOGLOBIN: 9.9 g/dL — AB (ref 12.0–15.0)
HEMOGLOBIN: 9.6 g/dL — AB (ref 12.0–15.0)
MCH: 28 pg (ref 26.0–34.0)
MCH: 27.9 pg (ref 26.0–34.0)
MCHC: 32 g/dL (ref 30.0–36.0)
MCHC: 32.3 g/dL (ref 30.0–36.0)
MCV: 87.5 fL (ref 78.0–100.0)
MCV: 86.3 fL (ref 78.0–100.0)
Platelets: 318 10*3/uL (ref 150–400)
Platelets: 252 10*3/uL (ref 150–400)
RBC: 3.53 MIL/uL — AB (ref 3.87–5.11)
RBC: 3.44 MIL/uL — AB (ref 3.87–5.11)
RDW: 14 % (ref 11.5–15.5)
RDW: 14 % (ref 11.5–15.5)
WBC: 14.4 10*3/uL — AB (ref 4.0–10.5)
WBC: 9.4 10*3/uL (ref 4.0–10.5)

## 2015-06-12 LAB — RAPID URINE DRUG SCREEN, HOSP PERFORMED
Amphetamines: NOT DETECTED
BENZODIAZEPINES: NOT DETECTED
Barbiturates: NOT DETECTED
Cocaine: NOT DETECTED
OPIATES: NOT DETECTED
Tetrahydrocannabinol: NOT DETECTED

## 2015-06-12 LAB — I-STAT CHEM 8, ED
BUN: 59 mg/dL — AB (ref 6–20)
CREATININE: 2.5 mg/dL — AB (ref 0.44–1.00)
Calcium, Ion: 1.14 mmol/L (ref 1.13–1.30)
Chloride: 101 mmol/L (ref 101–111)
GLUCOSE: 437 mg/dL — AB (ref 65–99)
HCT: 43 % (ref 36.0–46.0)
HEMOGLOBIN: 14.6 g/dL (ref 12.0–15.0)
POTASSIUM: 3.5 mmol/L (ref 3.5–5.1)
Sodium: 137 mmol/L (ref 135–145)
TCO2: 20 mmol/L (ref 0–100)

## 2015-06-12 LAB — URINALYSIS, ROUTINE W REFLEX MICROSCOPIC
Bilirubin Urine: NEGATIVE
Glucose, UA: 500 mg/dL — AB
Ketones, ur: NEGATIVE mg/dL
Leukocytes, UA: NEGATIVE
NITRITE: NEGATIVE
PH: 6 (ref 5.0–8.0)
Protein, ur: 100 mg/dL — AB
SPECIFIC GRAVITY, URINE: 1.013 (ref 1.005–1.030)

## 2015-06-12 LAB — DIFFERENTIAL
BASOS ABS: 0 10*3/uL (ref 0.0–0.1)
BASOS PCT: 0 %
EOS ABS: 0.4 10*3/uL (ref 0.0–0.7)
Eosinophils Relative: 3 %
LYMPHS ABS: 7.1 10*3/uL — AB (ref 0.7–4.0)
LYMPHS PCT: 49 %
Monocytes Absolute: 0.9 10*3/uL (ref 0.1–1.0)
Monocytes Relative: 6 %
NEUTROS PCT: 42 %
Neutro Abs: 6 10*3/uL (ref 1.7–7.7)

## 2015-06-12 LAB — GLUCOSE, CAPILLARY
GLUCOSE-CAPILLARY: 81 mg/dL (ref 65–99)
Glucose-Capillary: 163 mg/dL — ABNORMAL HIGH (ref 65–99)
Glucose-Capillary: 123 mg/dL — ABNORMAL HIGH (ref 65–99)
Glucose-Capillary: 133 mg/dL — ABNORMAL HIGH (ref 65–99)

## 2015-06-12 LAB — URINE MICROSCOPIC-ADD ON

## 2015-06-12 LAB — ETHANOL: Alcohol, Ethyl (B): <5 mg/dL (ref <5)

## 2015-06-12 LAB — I-STAT TROPONIN, ED: TROPONIN I, POC: 0.02 ng/mL (ref 0.00–0.08)

## 2015-06-12 LAB — MAGNESIUM: Magnesium: 2.2 mg/dL (ref 1.7–2.4)

## 2015-06-12 LAB — CBG MONITORING, ED
GLUCOSE-CAPILLARY: 429 mg/dL — AB (ref 65–99)
GLUCOSE-CAPILLARY: 416 mg/dL — AB (ref 65–99)
Glucose-Capillary: 250 mg/dL — ABNORMAL HIGH (ref 65–99)

## 2015-06-12 LAB — TSH: TSH: 1.312 u[IU]/mL (ref 0.350–4.500)

## 2015-06-12 LAB — APTT: APTT: 28 s (ref 24–37)

## 2015-06-12 LAB — MRSA PCR SCREENING: MRSA BY PCR: NEGATIVE

## 2015-06-12 MED ORDER — SODIUM CHLORIDE 0.9 % IV SOLN
INTRAVENOUS | Status: DC
  Administered 2015-06-12: 14:00:00 via INTRAVENOUS

## 2015-06-12 MED ORDER — ONDANSETRON HCL 4 MG/2ML IJ SOLN
4.0000 mg | Freq: Once | INTRAMUSCULAR | Status: AC
  Administered 2015-06-12: 4 mg via INTRAVENOUS

## 2015-06-12 MED ORDER — IRBESARTAN 150 MG PO TABS
150.0000 mg | ORAL_TABLET | Freq: Every day | ORAL | Status: DC
  Administered 2015-06-12: 150 mg via ORAL
  Filled 2015-06-12: qty 1

## 2015-06-12 MED ORDER — LORATADINE 10 MG PO TABS
10.0000 mg | ORAL_TABLET | Freq: Every day | ORAL | Status: DC
  Administered 2015-06-12 – 2015-06-14 (×3): 10 mg via ORAL
  Filled 2015-06-12 (×2): qty 1

## 2015-06-12 MED ORDER — DILTIAZEM HCL ER COATED BEADS 300 MG PO CP24
300.0000 mg | ORAL_CAPSULE | Freq: Every day | ORAL | Status: DC
  Administered 2015-06-12 – 2015-06-14 (×3): 300 mg via ORAL
  Filled 2015-06-12 (×3): qty 1

## 2015-06-12 MED ORDER — POTASSIUM CHLORIDE 20 MEQ/15ML (10%) PO SOLN
40.0000 meq | Freq: Every day | ORAL | Status: DC
  Administered 2015-06-12: 40 meq via ORAL
  Filled 2015-06-12: qty 30

## 2015-06-12 MED ORDER — CLONIDINE HCL 0.1 MG PO TABS
0.1000 mg | ORAL_TABLET | Freq: Two times a day (BID) | ORAL | Status: DC
  Administered 2015-06-12 (×2): 0.1 mg via ORAL
  Filled 2015-06-12 (×2): qty 1

## 2015-06-12 MED ORDER — POTASSIUM CHLORIDE 10 MEQ/100ML IV SOLN
10.0000 meq | INTRAVENOUS | Status: DC
  Administered 2015-06-12: 10 meq via INTRAVENOUS
  Filled 2015-06-12: qty 100

## 2015-06-12 MED ORDER — SODIUM BICARBONATE 650 MG PO TABS
650.0000 mg | ORAL_TABLET | Freq: Four times a day (QID) | ORAL | Status: DC
  Administered 2015-06-12 – 2015-06-14 (×8): 650 mg via ORAL
  Filled 2015-06-12 (×10): qty 1

## 2015-06-12 MED ORDER — SODIUM CHLORIDE 0.9 % IV BOLUS (SEPSIS)
1000.0000 mL | Freq: Once | INTRAVENOUS | Status: AC
  Administered 2015-06-12: 1000 mL via INTRAVENOUS

## 2015-06-12 MED ORDER — POLYETHYLENE GLYCOL 3350 17 G PO PACK
17.0000 g | PACK | Freq: Every day | ORAL | Status: DC | PRN
  Filled 2015-06-12: qty 1

## 2015-06-12 MED ORDER — HEPARIN SODIUM (PORCINE) 5000 UNIT/ML IJ SOLN
5000.0000 [IU] | Freq: Three times a day (TID) | INTRAMUSCULAR | Status: DC
  Administered 2015-06-12 – 2015-06-14 (×6): 5000 [IU] via SUBCUTANEOUS
  Filled 2015-06-12 (×6): qty 1

## 2015-06-12 MED ORDER — ACETAMINOPHEN 650 MG RE SUPP: 650.0000 mg | Freq: Four times a day (QID) | RECTAL | Status: DC | PRN

## 2015-06-12 MED ORDER — SODIUM CHLORIDE 0.9 % IJ SOLN
3.0000 mL | Freq: Two times a day (BID) | INTRAMUSCULAR | Status: DC
  Administered 2015-06-13 – 2015-06-14 (×3): 3 mL via INTRAVENOUS

## 2015-06-12 MED ORDER — ALLOPURINOL 300 MG PO TABS
300.0000 mg | ORAL_TABLET | Freq: Every day | ORAL | Status: DC
  Administered 2015-06-12 – 2015-06-14 (×3): 300 mg via ORAL
  Filled 2015-06-12: qty 3
  Filled 2015-06-12 (×2): qty 1

## 2015-06-12 MED ORDER — INSULIN GLARGINE 100 UNIT/ML ~~LOC~~ SOLN
15.0000 [IU] | Freq: Every day | SUBCUTANEOUS | Status: DC
  Administered 2015-06-12 – 2015-06-14 (×3): 15 [IU] via SUBCUTANEOUS
  Filled 2015-06-12 (×3): qty 0.15

## 2015-06-12 MED ORDER — PRAVASTATIN SODIUM 40 MG PO TABS
80.0000 mg | ORAL_TABLET | Freq: Every day | ORAL | Status: DC
  Administered 2015-06-12 – 2015-06-14 (×3): 80 mg via ORAL
  Filled 2015-06-12: qty 1
  Filled 2015-06-12 (×2): qty 2

## 2015-06-12 MED ORDER — SUCRALFATE 1 G PO TABS: 1.0000 g | ORAL_TABLET | Freq: Three times a day (TID) | ORAL | Status: DC

## 2015-06-12 MED ORDER — SODIUM CHLORIDE 0.9 % IV SOLN
INTRAVENOUS | Status: DC
Start: 2015-06-12 — End: 2015-06-12
  Administered 2015-06-12: 14:00:00 via INTRAVENOUS

## 2015-06-12 MED ORDER — INSULIN ASPART 100 UNIT/ML ~~LOC~~ SOLN: 0.0000 [IU] | Freq: Every day | SUBCUTANEOUS | Status: DC

## 2015-06-12 MED ORDER — LORAZEPAM 2 MG/ML IJ SOLN
INTRAMUSCULAR | Status: AC
  Administered 2015-06-12: 1 mg via INTRAVENOUS
  Filled 2015-06-12: qty 1

## 2015-06-12 MED ORDER — INSULIN GLARGINE 100 UNIT/ML ~~LOC~~ SOLN
30.0000 [IU] | Freq: Every day | SUBCUTANEOUS | Status: DC
  Filled 2015-06-12: qty 0.3

## 2015-06-12 MED ORDER — FERROUS FUMARATE 325 (106 FE) MG PO TABS
1.0000 | ORAL_TABLET | Freq: Every day | ORAL | Status: DC
  Administered 2015-06-12: 106 mg via ORAL
  Filled 2015-06-12: qty 1

## 2015-06-12 MED ORDER — LEVOTHYROXINE SODIUM 50 MCG PO TABS
50.0000 ug | ORAL_TABLET | Freq: Every day | ORAL | Status: DC
  Administered 2015-06-12 – 2015-06-14 (×3): 50 ug via ORAL
  Filled 2015-06-12 (×2): qty 1
  Filled 2015-06-12: qty 2
  Filled 2015-06-12: qty 1

## 2015-06-12 MED ORDER — METOPROLOL SUCCINATE ER 25 MG PO TB24
25.0000 mg | ORAL_TABLET | Freq: Every day | ORAL | Status: DC
  Administered 2015-06-12 – 2015-06-14 (×3): 25 mg via ORAL
  Filled 2015-06-12 (×3): qty 1

## 2015-06-12 MED ORDER — ASPIRIN EC 81 MG PO TBEC
81.0000 mg | DELAYED_RELEASE_TABLET | Freq: Every day | ORAL | Status: DC
  Administered 2015-06-12 – 2015-06-14 (×3): 81 mg via ORAL
  Filled 2015-06-12 (×3): qty 1

## 2015-06-12 MED ORDER — CALCITRIOL 0.25 MCG PO CAPS
0.2500 ug | ORAL_CAPSULE | Freq: Every day | ORAL | Status: DC
  Administered 2015-06-12 – 2015-06-14 (×3): 0.25 ug via ORAL
  Filled 2015-06-12 (×3): qty 1

## 2015-06-12 MED ORDER — LORAZEPAM 2 MG/ML IJ SOLN: 1.0000 mg | Freq: Once | INTRAMUSCULAR | Status: DC | PRN

## 2015-06-12 MED ORDER — ALUM & MAG HYDROXIDE-SIMETH 200-200-20 MG/5ML PO SUSP: 30.0000 mL | Freq: Four times a day (QID) | ORAL | Status: DC | PRN

## 2015-06-12 MED ORDER — INSULIN ASPART 100 UNIT/ML ~~LOC~~ SOLN: 4.0000 [IU] | Freq: Three times a day (TID) | SUBCUTANEOUS | Status: DC

## 2015-06-12 MED ORDER — SODIUM CHLORIDE 0.9 % IV SOLN
INTRAVENOUS | Status: DC
  Administered 2015-06-12: 3.6 [IU]/h via INTRAVENOUS
  Filled 2015-06-12: qty 2.5

## 2015-06-12 MED ORDER — INSULIN ASPART 100 UNIT/ML ~~LOC~~ SOLN
10.0000 [IU] | Freq: Once | SUBCUTANEOUS | Status: AC
  Administered 2015-06-12: 10 [IU] via SUBCUTANEOUS
  Filled 2015-06-12: qty 1

## 2015-06-12 MED ORDER — ONDANSETRON HCL 4 MG/2ML IJ SOLN: 4.0000 mg | Freq: Four times a day (QID) | INTRAMUSCULAR | Status: DC | PRN

## 2015-06-12 MED ORDER — LORAZEPAM 2 MG/ML IJ SOLN
4.0000 mg | INTRAMUSCULAR | Status: AC
Start: 2015-06-12 — End: 2015-06-12

## 2015-06-12 MED ORDER — ONDANSETRON HCL 4 MG/2ML IJ SOLN
INTRAMUSCULAR | Status: AC
  Administered 2015-06-12: 4 mg via INTRAVENOUS
  Filled 2015-06-12: qty 2

## 2015-06-12 MED ORDER — ONDANSETRON HCL 4 MG PO TABS
4.0000 mg | ORAL_TABLET | Freq: Four times a day (QID) | ORAL | Status: DC | PRN
Start: 2015-06-12 — End: 2015-06-14

## 2015-06-12 MED ORDER — SODIUM CHLORIDE 0.9 % IV SOLN
1000.0000 mg | Freq: Once | INTRAVENOUS | Status: AC
  Administered 2015-06-12: 1000 mg via INTRAVENOUS
  Filled 2015-06-12: qty 10

## 2015-06-12 MED ORDER — FUROSEMIDE 20 MG PO TABS: 80.0000 mg | ORAL_TABLET | Freq: Every day | ORAL | Status: DC

## 2015-06-12 MED ORDER — LACOSAMIDE 50 MG PO TABS
100.0000 mg | ORAL_TABLET | Freq: Two times a day (BID) | ORAL | Status: DC
  Administered 2015-06-12 – 2015-06-14 (×4): 100 mg via ORAL
  Filled 2015-06-12 (×4): qty 2

## 2015-06-12 MED ORDER — ONDANSETRON HCL 4 MG/2ML IJ SOLN
4.0000 mg | Freq: Four times a day (QID) | INTRAMUSCULAR | Status: DC | PRN
  Administered 2015-06-12: 4 mg via INTRAVENOUS
  Filled 2015-06-12: qty 2

## 2015-06-12 MED ORDER — INSULIN ASPART 100 UNIT/ML ~~LOC~~ SOLN
0.0000 [IU] | Freq: Three times a day (TID) | SUBCUTANEOUS | Status: DC
  Administered 2015-06-13: 8 [IU] via SUBCUTANEOUS
  Administered 2015-06-13: 11 [IU] via SUBCUTANEOUS
  Administered 2015-06-14: 5 [IU] via SUBCUTANEOUS
  Administered 2015-06-14: 11 [IU] via SUBCUTANEOUS

## 2015-06-12 MED ORDER — PANTOPRAZOLE SODIUM 40 MG PO TBEC
40.0000 mg | DELAYED_RELEASE_TABLET | Freq: Every day | ORAL | Status: DC
  Administered 2015-06-12 – 2015-06-14 (×3): 40 mg via ORAL
  Filled 2015-06-12 (×3): qty 1

## 2015-06-12 MED ORDER — INSULIN ASPART 100 UNIT/ML ~~LOC~~ SOLN: 0.0000 [IU] | Freq: Three times a day (TID) | SUBCUTANEOUS | Status: DC

## 2015-06-12 MED ORDER — DEXTROSE-NACL 5-0.45 % IV SOLN
INTRAVENOUS | Status: DC
  Administered 2015-06-12: 17:00:00 via INTRAVENOUS

## 2015-06-12 MED ORDER — INSULIN GLARGINE 100 UNIT/ML ~~LOC~~ SOLN
20.0000 [IU] | Freq: Every day | SUBCUTANEOUS | Status: DC
  Filled 2015-06-12: qty 0.2

## 2015-06-12 MED ORDER — LORAZEPAM 2 MG/ML IJ SOLN
1.0000 mg | Freq: Once | INTRAMUSCULAR | Status: AC
  Administered 2015-06-12: 1 mg via INTRAVENOUS

## 2015-06-12 MED ORDER — ACETAMINOPHEN 325 MG PO TABS: 650.0000 mg | ORAL_TABLET | Freq: Four times a day (QID) | ORAL | Status: DC | PRN

## 2015-06-12 NOTE — H&P (Signed)
[facility] History and Physical                                                                                    Emily Hughes, is a 74 y.o. female  MRN: [id number]   DOB - 16-Sep-1940  Admit Date - 06/12/2015  Outpatient Primary MD for the patient is Odette Fraction, MD  Referring Physician:  Dr. Regenia Skeeter.  Chief Complaint:   Chief Complaint  Patient presents with  . Seizures     HPI  Emily Hughes  is a 74 y.o. female, with DM, HLD, HTN, and CKD 3, who presents to the ER with seizure. Patient reports that she was feeling well yesterday evening when she went to bed. This morning she woke up out of bed feeling well and walked  to the shower her left hand can to stiffen and curl up involuntarily. She got in the shower became dizzy and had to drop to her knees.  She eventually got out of the shower and called for help.  In the emergency department she had a second seizure described as a full blown tonic-clonic seizure by the EDP.  The patient states she has no history of seizures. She denies any infection, cough, sore throat, fever, chest pain, respiratory illness, abdominal pain, changes in bowel habits, dysuria.  She denies taking any new medications.  In the emergency room the patient is now alert and oriented. Her glucose is noted to be in the 460s with an anion gap of 18.  Her creatinine is at baseline, 2.5.  CBC is elevated at 14.4.  Her baseline hemoglobin appears to be 10.  Neurology has evaluated the patient in the ER and started an antiepileptic medication. She will be admitted for seizure workup and DKA.  Review of Systems  Constitutional: Negative.   HENT: Negative.   Eyes: Negative.   Respiratory: Negative.   Cardiovascular: Negative.   Gastrointestinal: Negative.   Genitourinary: Negative.   Musculoskeletal: Negative.   Skin: Negative.   Neurological: Positive for dizziness, focal weakness and seizures.  Endo/Heme/Allergies: Negative.    Psychiatric/Behavioral: Negative.      Past Medical History  Past Medical History  Diagnosis Date  . High cholesterol   . Diabetes mellitus   . Hypertension   . Anemia   . CAD (coronary artery disease)   . Pancreatitis   . Colon polyps   . Proteinuria   . DDD (degenerative disc disease)   . CRI (chronic renal insufficiency)   . GERD (gastroesophageal reflux disease)     Past Surgical History  Procedure Laterality Date  . Back surgery    . Ankle surgery    . Tonsillectomy    . Angioplasty        Social History Social History  Substance Use Topics  . Smoking status: Former Research scientist (life sciences)  . Smokeless tobacco: Not on file  . Alcohol Use: No   she lives at home alone and is independent with her ADLs.  Family History The patient denies any history of seizures in her family.  Prior to Admission medications   Medication Sig Start Date End Date Taking? Authorizing Provider  ACCU-CHEK SOFTCLIX LANCETS  lancets Use as instructed 04/08/14  Yes Susy Frizzle, MD  allopurinol (ZYLOPRIM) 300 MG tablet TAKE 1 TABLET ONCE DAILY. 12/19/14  Yes Susy Frizzle, MD  aspirin 81 MG tablet Take 81 mg by mouth daily.     Yes Historical Provider, MD  Blood Glucose Monitoring Suppl (ACCU-CHEK AVIVA PLUS) W/DEVICE KIT Dx - 250.00 04/08/14  Yes Susy Frizzle, MD  calcitRIOL (ROCALTROL) 0.25 MCG capsule Take 0.25 mcg by mouth daily.   Yes Historical Provider, MD  cetirizine (ZYRTEC) 10 MG tablet Take 1 tablet (10 mg total) by mouth daily. 12/04/13  Yes Susy Frizzle, MD  cloNIDine (CATAPRES) 0.1 MG tablet Take 1 tablet (0.1 mg total) by mouth 2 (two) times daily. 07/19/14  Yes Susy Frizzle, MD  diltiazem (CARDIZEM CD) 300 MG 24 hr capsule Take 1 capsule (300 mg total) by mouth daily. 07/19/14  Yes Susy Frizzle, MD  esomeprazole (NEXIUM) 40 MG capsule TAKE 1 CAPSULE TWICE DAILY BEFORE MEALS 11/15/14  Yes Susy Frizzle, MD  ferrous fumarate (FERRO-SEQUELS) 50 MG CR tablet Take 50 mg by  mouth daily.     Yes Historical Provider, MD  furosemide (LASIX) 40 MG tablet TAKE 2 TABLETS IN THE MORNING AND 1 TABLET IN THE EVENING. 07/19/14  Yes Susy Frizzle, MD  glimepiride (AMARYL) 4 MG tablet Take 1 tablet (4 mg total) by mouth daily. 07/19/14  Yes Susy Frizzle, MD  glucose blood (ACCU-CHEK AVIVA PLUS) test strip Checks BS 4-5 times per day, QAM, Q AC Meals, QHS - DX-250.00 04/08/14  Yes Susy Frizzle, MD  insulin aspart (NOVOLOG) 100 UNIT/ML FlexPen Inject 10 Units into the skin daily with lunch. 07/24/14  Yes Susy Frizzle, MD  Insulin Glargine (LANTUS) 100 UNIT/ML Solostar Pen Inject 38 Units into the skin daily. 07/24/14  Yes Susy Frizzle, MD  Insulin Pen Needle 31G X 5 MM MISC As directed for insulin use 07/24/14  Yes Susy Frizzle, MD  levothyroxine (SYNTHROID, LEVOTHROID) 50 MCG tablet Take 1 tablet (50 mcg total) by mouth daily. 07/19/14  Yes Susy Frizzle, MD  metoprolol succinate (TOPROL-XL) 25 MG 24 hr tablet TAKE (1) TABLET DAILY. 07/19/14  Yes Susy Frizzle, MD  mometasone (ELOCON) 0.1 % ointment APPLY TOPICALLY DAILY. 04/25/14  Yes Susy Frizzle, MD  pravastatin (PRAVACHOL) 80 MG tablet TAKE ONE TABLET AT BEDTIME. 01/28/15  Yes Susy Frizzle, MD  sodium bicarbonate 650 MG tablet Take 1 tablet (650 mg total) by mouth 4 (four) times daily. 07/19/14  Yes Susy Frizzle, MD  sucralfate (CARAFATE) 1 G tablet Take 1 tablet (1 g total) by mouth 4 (four) times daily -  with meals and at bedtime. 07/11/14  Yes Susy Frizzle, MD  UNABLE TO FIND Takes a potassium suspension daily   Yes Historical Provider, MD  valsartan (DIOVAN) 160 MG tablet Take 160 mg by mouth daily.     Yes Historical Provider, MD    Allergies  Allergen Reactions  . Ace Inhibitors   . Actos [Pioglitazone Hydrochloride]   . Dust Mite Extract   . Dye Fdc Red [Erythrosine Red No. 3]     Physical Exam  Vitals  Blood pressure 135/79, pulse 84, temperature 97.9 F (36.6 C),  temperature source Oral, resp. rate 19, SpO2 100 %.   General:  Pleasant, elderly black female lying in bed in NAD, granddaughter at bedside  Psych:  Normal affect and insight, Not Suicidal or Homicidal,  Awake Alert, Oriented X 3. Slightly slow to respond.  Neuro:   No F.N deficits, ALL C.Nerves Intact, Strength 5/5 all 4 extremities, Sensation intact all 4 extremities.  ENT:  Ears and Eyes appear Normal, Conjunctivae clear, PER. Moist oral mucosa without erythema or exudates.  Neck:  Supple, No lymphadenopathy appreciated  Respiratory:  Symmetrical chest wall movement, Good air movement bilaterally, CTAB.  Cardiac:  RRR, No Murmurs, no LE edema noted, no JVD.    Abdomen:  Positive bowel sounds, Soft, Non tender, Non distended,  No masses appreciated  Skin:  No Cyanosis, Normal Skin Turgor, No Skin Rash or Bruise.  Extremities:  Able to move all 4. 5/5 strength in each,  no effusions.  Data Review  Wt Readings from Last 3 Encounters:  05/16/15 115.214 kg (254 lb)  10/24/14 116.121 kg (256 lb)  09/24/14 116.121 kg (256 lb)    CBC  Recent Labs Lab 06/12/15 1002 06/12/15 1027  WBC 14.4*  --   HGB 9.9* 14.6  HCT 30.9* 43.0  PLT 318  --   MCV 87.5  --   MCH 28.0  --   MCHC 32.0  --   RDW 14.0  --   LYMPHSABS 7.1*  --   MONOABS 0.9  --   EOSABS 0.4  --   BASOSABS 0.0  --     Chemistries   Recent Labs Lab 06/12/15 1002 06/12/15 1027  NA 136 137  K 3.5 3.5  CL 101 101  CO2 17*  --   GLUCOSE 441* 437*  BUN 52* 59*  CREATININE 2.80* 2.50*  CALCIUM 9.8  --   AST 22  --   ALT 17  --   ALKPHOS 92  --   BILITOT 0.6  --      Lab Results  Component Value Date   HGBA1C 8.5* 04/08/2014    Coagulation profile  Recent Labs Lab 06/12/15 1028  INR 1.20    Urinalysis    Component Value Date/Time   COLORURINE YELLOW 06/12/2015 1236   APPEARANCEUR CLEAR 06/12/2015 1236   LABSPEC 1.013 06/12/2015 1236   PHURINE 6.0 06/12/2015 1236   GLUCOSEU 500*  06/12/2015 1236   HGBUR SMALL* 06/12/2015 1236   BILIRUBINUR NEGATIVE 06/12/2015 1236   KETONESUR NEGATIVE 06/12/2015 1236   PROTEINUR 100* 06/12/2015 1236   UROBILINOGEN 0.2 11/21/2009 2241   NITRITE NEGATIVE 06/12/2015 1236   LEUKOCYTESUR NEGATIVE 06/12/2015 1236    Imaging results:   Ct Head Wo Contrast  06/12/2015  CLINICAL DATA:  New onset seizure EXAM: CT HEAD WITHOUT CONTRAST TECHNIQUE: Contiguous axial images were obtained from the base of the skull through the vertex without intravenous contrast. COMPARISON:  None. FINDINGS: Skull and Sinuses:Negative for fracture or destructive process. The visualized mastoids, middle ears, and imaged paranasal sinuses are clear. Orbits: Bilateral cataract resection.  No acute finding. Brain: No evidence of acute infarction, hemorrhage, hydrocephalus, or mass lesion/mass effect. Generalized cerebral volume loss which is normal for age. No cortical findings to explain seizure. IMPRESSION: No acute finding or explanation for seizure. Electronically Signed   By: Monte Fantasia M.D.   On: 06/12/2015 11:09    My personal review of EKG: pending   Assessment & Plan  Principal Problem:   Seizures ([place]) Active Problems:   Essential hypertension   RENAL INSUFFICIENCY, CHRONIC   High cholesterol   GERD (gastroesophageal reflux disease)   DKA (diabetic ketoacidoses) (HCC)   Seizure x 2  Uncertain etiology. No signs of infection. No  extreme electrolyte abnormalities. Will check MR brain and adult EEG.  Starting antiepileptic medications per Dr. Leonel Ramsay. Appreciate neuro recommendations.  Admitted to stepdown.  Diabetic ketoacidosis in the setting of type 2 diabetes. CBG in the 400s. Anion gap 18, bicarbonate 17 (although patient takes daily oral bicarbonate tablets) We'll start glucose stabilizer drip and DKA protocol. Check bmet every 4 hours until gap closes. Once gap is closed we can start her home doses of insulin.  Will allow her to eat  carb modified/heart healthy diet.  Chronic kidney disease stage III-4 Creatinine is at baseline (2.5)  Essential hypertension Holding Lasix. As she is receiving fluids for DKA.  Continue metoprolol, clonidine, valsartan, Cardizem.  Chronic acidosis On sodium bicarbonate tablets daily. We'll continue these.  Hypothyroidism Check TSH. Continue thyroid supplementation.    Consultants Called:    Neurology  Family Communication:     Granddaughter bedside  Code Status:   Full code   Condition:    Guarded  Potential Disposition:   To be determined. PT/OT evaluations pending.  Time spent in minutes : [place],  [place] on 06/12/2015 at 2:12 PM Between 7am to 7pm - Pager - [phone] After 7pm go to [email or web address] - password [email or web address] And look for the night coverage person covering me after hours

## 2015-06-12 NOTE — ED Notes (Signed)
Meal given

## 2015-06-12 NOTE — Consult Note (Signed)
NEURO HOSPITALIST CONSULT NOTE   Requestig physician: Dr. Regenia Skeeter   Reason for Consult:now onset seizure  HPI:                                                                                                                                          Emily Hughes is an 74 y.o. female with history of T2DM, HTN, CKD Stage 4, CAD who presented to the ED after an episode of left hand clinching and uncontrolled movement.  She did not lose conscienceness during the episode or report any associated symptoms.  After arrival in the ED she had another episode of left arm clinching that progressed to a brief 1-2 minute tonic clonic seizure per  ED RN note.  She was given 1 mg of Ativan.  She does not remember the tonic clonic seizure in the ED.  Overall she reports that she feels generally weak and hungry but denies any acute complaints.  Family is present at bedside.  She does drive.  She denies illicit drug use.  Past Medical History  Diagnosis Date  . High cholesterol   . Diabetes mellitus   . Hypertension   . Anemia   . CAD (coronary artery disease)   . Pancreatitis   . Colon polyps   . Proteinuria   . DDD (degenerative disc disease)   . CRI (chronic renal insufficiency)   . GERD (gastroesophageal reflux disease)     Past Surgical History  Procedure Laterality Date  . Back surgery    . Ankle surgery    . Tonsillectomy    . Angioplasty      No family history on file.  Family History: Mother CAD Denies any family history of seizure disorders  Social History:  reports that she has quit smoking. She does not have any smokeless tobacco history on file. She reports that she does not drink alcohol or use illicit drugs.  Allergies  Allergen Reactions  . Ace Inhibitors   . Actos [Pioglitazone Hydrochloride]   . Dust Mite Extract   . Dye Fdc Red [Erythrosine Red No. 3]     MEDICATIONS:  I have reviewed the patient's current medications.   ROS:                                                                                                                                       History obtained from the patient  General ROS: negative for - chills, fatigue, fever Psychological ROS: negative for - behavioral disorder, hallucinations, memory difficulties Ophthalmic ROS: negative for - blurry vision, double vision, eye pain or loss of vision ENT ROS: negative for -  sore throat, tinnitus or vertigo Allergy and Immunology ROS: negative for - hives or itchy/watery eyes Hematological and Lymphatic ROS: negative for - bleeding problems, bruising  Endocrine ROS: negative for -  polydipsia/polyuria or temperature intolerance Respiratory ROS: negative for - cough, hemoptysis, shortness of breath or wheezing Cardiovascular ROS: negative for - chest pain, dyspnea on exertion, edema or irregular heartbeat Gastrointestinal ROS: negative for - abdominal pain, diarrhea, , nausea/vomiting or stool incontinence Genito-Urinary ROS: negative for - dysuria, hematuria, incontinence or urinary frequency/urgency Musculoskeletal ROS: positive for back and knee pain negative for - joint swelling or muscular weakness Neurological ROS: as noted in HPI Dermatological ROS: negative for rash and skin lesion changes   Blood pressure 135/79, pulse 84, temperature 98.3 F (36.8 C), temperature source Oral, resp. rate 19, SpO2 100 %.   Neurologic Examination:                                                                                                      HEENT-  Normocephalic, no lesions, without obvious abnormality.  Normal external eye and conjunctiva.  Normal TM's bilaterally.  Normal auditory canals and external ears. Normal external nose, mucus membranes and septum.  Normal pharynx. Cardiovascular- regular rate and rhythm, S1, S2 normal, no murmur, click, rub or  gallop, pulses palpable throughout   Lungs- chest clear, no wheezing, rales, normal symmetric air entry, Heart exam - S1, S2 normal, no murmur, no gallop, rate regular Abdomen- soft, non-tender; bowel sounds normal; no masses,  no organomegaly Extremities- no joint deformities, effusion, or inflammation Lymph-no adenopathy palpable Musculoskeletal-no joint tenderness, deformity or swelling Skin-warm and dry, no hyperpigmentation, vitiligo, or suspicious lesions  Neurological Examination Mental Status: Alert, oriented, thought content appropriate.  Speech fluent without evidence of aphasia.  Able to follow 3 step commands without difficulty. Cranial Nerves: II: Visual fields grossly normal, pupils equal, round, reactive to light and accommodation III,IV, VI: ptosis not present, extra-ocular motions intact bilaterally V,VII: smile symmetric, facial  light touch sensation normal bilaterally VIII: hearing normal bilaterally IX,X: uvula rises symmetrically XI: bilateral shoulder shrug XII: midline tongue extension Motor: Right : Upper extremity   5/5    Left:     Upper extremity   5/5  Lower extremity   5/5     Lower extremity   5/5 Tone and bulk:normal tone throughout; no atrophy noted Sensory:  light touch intact throughout, bilaterally Deep Tendon Reflexes: 2+ and symmetric throughout Plantars: Right: downgoing   Left: downgoing Cerebellar: normal finger-to-nose, normal rapid alternating movements Gait: not assessed      Lab Results: Basic Metabolic Panel:  Recent Labs Lab 06/12/15 1002 06/12/15 1027  NA 136 137  K 3.5 3.5  CL 101 101  CO2 17*  --   GLUCOSE 441* 437*  BUN 52* 59*  CREATININE 2.80* 2.50*  CALCIUM 9.8  --     Liver Function Tests:  Recent Labs Lab 06/12/15 1002  AST 22  ALT 17  ALKPHOS 92  BILITOT 0.6  PROT 7.9  ALBUMIN 3.6   No results for input(s): LIPASE, AMYLASE in the last 168 hours. No results for input(s): AMMONIA in the last 168  hours.  CBC:  Recent Labs Lab 06/12/15 1002 06/12/15 1027  WBC 14.4*  --   NEUTROABS 6.0  --   HGB 9.9* 14.6  HCT 30.9* 43.0  MCV 87.5  --   PLT 318  --     Cardiac Enzymes: No results for input(s): CKTOTAL, CKMB, CKMBINDEX, TROPONINI in the last 168 hours.  Lipid Panel: No results for input(s): CHOL, TRIG, HDL, CHOLHDL, VLDL, LDLCALC in the last 168 hours.  CBG:  Recent Labs Lab 06/12/15 0959  GLUCAP 429*    Microbiology: Results for orders placed or performed during the hospital encounter of 11/21/09  Urine culture     Status: None   Collection Time: 11/21/09 10:40 PM  Result Value Ref Range Status   Specimen Description URINE, RANDOM  Final   Special Requests NONE  Final   Colony Count 25,000 COLONIES/ML  Final   Culture   Final    Multiple bacterial morphotypes present, none predominant. Suggest appropriate recollection if clinically indicated.   Report Status 11/23/2009 FINAL  Final    Coagulation Studies:  Recent Labs  06/12/15 1028  LABPROT 15.3*  INR 1.20    Imaging: Ct Head Wo Contrast  06/12/2015  CLINICAL DATA:  New onset seizure EXAM: CT HEAD WITHOUT CONTRAST TECHNIQUE: Contiguous axial images were obtained from the base of the skull through the vertex without intravenous contrast. COMPARISON:  None. FINDINGS: Skull and Sinuses:Negative for fracture or destructive process. The visualized mastoids, middle ears, and imaged paranasal sinuses are clear. Orbits: Bilateral cataract resection.  No acute finding. Brain: No evidence of acute infarction, hemorrhage, hydrocephalus, or mass lesion/mass effect. Generalized cerebral volume loss which is normal for age. No cortical findings to explain seizure. IMPRESSION: No acute finding or explanation for seizure. Electronically Signed   By: Monte Fantasia M.D.   On: 06/12/2015 11:09    Assessment/Plan: 74 yo with DM,CKD4, HTN who presents with focal seizure that progressed to witnessed tonic clonic seizure  in the ED.  Seizure disorder - CT head shows no acute abnormality for focus for seizure, she does have renal dysfunction but appears roughly at baseline.  Electrolytes are wnl.  Hyperglycemia can cause focal motor seizure but unlikely to cause generalized seizure.  She also is on a number of medications that potentially could cause seizures  notability cardizem. - Start AE medication per dr Leonel Ramsay (will need dose adjustment for CKD4) - Consider MRI brain - No diving for 6 months  Hyperglycemia in T2DM CKD stage [place], DO IMTS PGY-3 Pager: [phone] 06/12/2015, 12:01 PM

## 2015-06-12 NOTE — ED Provider Notes (Signed)
CSN: [id number]     Arrival date & time 06/12/15  2409 History   First MD Initiated Contact with Patient 06/12/15 [phone]     No chief complaint on file.    (Consider location/radiation/quality/duration/timing/severity/associated sxs/prior Treatment) HPI  74 year old female presents with an acute seizure that started this morning. No prior history of seizures. Was getting to go into the shower when she felt sudden onset left arm shaking and stiffening. She states she did not lose consciousness. The family the bedside did not see occur but brought her into the ER. Just prior to my arrival while she was getting dressed she developed recurrent left arm stiffening that the nurse witnessed then proceeded into a generalized tonic-clonic seizure. Patient now awake and alert and states that she has not had any recent fevers, headaches, vomiting, diarrhea, chest pain, or abdominal pain. Chronic back pain. Feels generically weak but denies focal weakness.  Past Medical History  Diagnosis Date  . High cholesterol   . Diabetes mellitus   . Hypertension   . Anemia   . CAD (coronary artery disease)   . Pancreatitis   . Colon polyps   . Proteinuria   . DDD (degenerative disc disease)   . CRI (chronic renal insufficiency)   . GERD (gastroesophageal reflux disease)    Past Surgical History  Procedure Laterality Date  . Back surgery    . Ankle surgery    . Tonsillectomy    . Angioplasty     No family history on file. Social History  Substance Use Topics  . Smoking status: Former Research scientist (life sciences)  . Smokeless tobacco: Not on file  . Alcohol Use: No   OB History    No data available     Review of Systems  Constitutional: Negative for fever.  Eyes: Negative for visual disturbance.  Respiratory: Negative for shortness of breath.   Cardiovascular: Negative for chest pain.  Gastrointestinal: Negative for vomiting.  Neurological: Positive for seizures. Negative for dizziness, weakness, numbness and  headaches.  All other systems reviewed and are negative.     Allergies  Ace inhibitors; Actos; Dust mite extract; and Dye fdc red  Home Medications   Prior to Admission medications   Medication Sig Start Date End Date Taking? Authorizing Provider  ACCU-CHEK SOFTCLIX LANCETS lancets Use as instructed 04/08/14   Susy Frizzle, MD  allopurinol (ZYLOPRIM) 300 MG tablet TAKE 1 TABLET ONCE DAILY. 12/19/14   Susy Frizzle, MD  aspirin 81 MG tablet Take 81 mg by mouth daily.      Historical Provider, MD  Blood Glucose Monitoring Suppl (ACCU-CHEK AVIVA PLUS) W/DEVICE KIT Dx - 250.00 04/08/14   Susy Frizzle, MD  calcitRIOL (ROCALTROL) 0.25 MCG capsule Take 0.25 mcg by mouth daily.    Historical Provider, MD  cetirizine (ZYRTEC) 10 MG tablet Take 1 tablet (10 mg total) by mouth daily. 12/04/13   Susy Frizzle, MD  cloNIDine (CATAPRES) 0.1 MG tablet Take 1 tablet (0.1 mg total) by mouth 2 (two) times daily. 07/19/14   Susy Frizzle, MD  diltiazem (CARDIZEM CD) 300 MG 24 hr capsule Take 1 capsule (300 mg total) by mouth daily. 07/19/14   Susy Frizzle, MD  doxycycline (VIBRA-TABS) 100 MG tablet Take 1 tablet (100 mg total) by mouth 2 (two) times daily. 05/16/15   Susy Frizzle, MD  esomeprazole (NEXIUM) 40 MG capsule TAKE 1 CAPSULE TWICE DAILY BEFORE MEALS 11/15/14   Susy Frizzle, MD  ferrous fumarate (FERRO-SEQUELS) 50  MG CR tablet Take 50 mg by mouth daily.      Historical Provider, MD  furosemide (LASIX) 40 MG tablet TAKE 2 TABLETS IN THE MORNING AND 1 TABLET IN THE EVENING. 07/19/14   Susy Frizzle, MD  glimepiride (AMARYL) 4 MG tablet Take 1 tablet (4 mg total) by mouth daily. 07/19/14   Susy Frizzle, MD  glucose blood (ACCU-CHEK AVIVA PLUS) test strip Checks BS 4-5 times per day, QAM, Q AC Meals, QHS - DX-250.00 04/08/14   Susy Frizzle, MD  HYDROcodone-acetaminophen (NORCO/VICODIN) 5-325 MG per tablet Take 2 tablets by mouth every 4 (four) hours as needed. 12/26/14    Fransico Meadow, PA-C  insulin aspart (NOVOLOG) 100 UNIT/ML FlexPen Inject 10 Units into the skin daily with lunch. 07/24/14   Susy Frizzle, MD  Insulin Glargine (LANTUS) 100 UNIT/ML Solostar Pen Inject 38 Units into the skin daily. 07/24/14   Susy Frizzle, MD  Insulin Pen Needle 31G X 5 MM MISC As directed for insulin use 07/24/14   Susy Frizzle, MD  levothyroxine (SYNTHROID, LEVOTHROID) 50 MCG tablet Take 1 tablet (50 mcg total) by mouth daily. 07/19/14   Susy Frizzle, MD  metoprolol succinate (TOPROL-XL) 25 MG 24 hr tablet TAKE (1) TABLET DAILY. 07/19/14   Susy Frizzle, MD  mometasone (ELOCON) 0.1 % ointment APPLY TOPICALLY DAILY. 04/25/14   Susy Frizzle, MD  pravastatin (PRAVACHOL) 80 MG tablet TAKE ONE TABLET AT BEDTIME. 01/28/15   Susy Frizzle, MD  sodium bicarbonate 650 MG tablet Take 1 tablet (650 mg total) by mouth 4 (four) times daily. 07/19/14   Susy Frizzle, MD  sucralfate (CARAFATE) 1 G tablet Take 1 tablet (1 g total) by mouth 4 (four) times daily -  with meals and at bedtime. 07/11/14   Susy Frizzle, MD  sucralfate (CARAFATE) 1 GM/10ML suspension Take 10 mLs (1 g total) by mouth 4 (four) times daily -  with meals and at bedtime. 07/02/14   Susy Frizzle, MD  UNABLE TO FIND Takes a potassium suspension daily    Historical Provider, MD  valsartan (DIOVAN) 160 MG tablet Take 160 mg by mouth daily.      Historical Provider, MD   There were no vitals taken for this visit. Physical Exam  Constitutional: She is oriented to person, place, and time. She appears well-developed and well-nourished.  HENT:  Head: Normocephalic and atraumatic.  Right Ear: External ear normal.  Left Ear: External ear normal.  Nose: Nose normal.  Eyes: EOM are normal. Pupils are equal, round, and reactive to light. Right eye exhibits no discharge. Left eye exhibits no discharge.  Neck: Neck supple.  Cardiovascular: Normal rate, regular rhythm and normal heart sounds.    Pulmonary/Chest: Effort normal and breath sounds normal.  Abdominal: Soft. There is no tenderness.  Neurological: She is alert and oriented to person, place, and time.  CN 2-12 grossly intact. 5/5 strength in all 4 extremities. Grossly normal sensation  Skin: Skin is warm and dry.  Nursing note and vitals reviewed.   ED Course  Procedures (including critical care time) Labs Review Labs Reviewed  PROTIME-INR - Abnormal; Notable for the following:    Prothrombin Time 15.3 (*)    All other components within normal limits  CBC - Abnormal; Notable for the following:    WBC 14.4 (*)    RBC 3.53 (*)    Hemoglobin 9.9 (*)    HCT 30.9 (*)  All other components within normal limits  DIFFERENTIAL - Abnormal; Notable for the following:    Lymphs Abs 7.1 (*)    All other components within normal limits  COMPREHENSIVE METABOLIC PANEL - Abnormal; Notable for the following:    CO2 17 (*)    Glucose, Bld 441 (*)    BUN 52 (*)    Creatinine, Ser 2.80 (*)    GFR calc non Af Amer 16 (*)    GFR calc Af Amer 18 (*)    Anion gap 18 (*)    All other components within normal limits  URINALYSIS, ROUTINE W REFLEX MICROSCOPIC (NOT AT [facility]) - Abnormal; Notable for the following:    Glucose, UA 500 (*)    Hgb urine dipstick SMALL (*)    Protein, ur 100 (*)    All other components within normal limits  URINE MICROSCOPIC-ADD ON - Abnormal; Notable for the following:    Squamous Epithelial / LPF 0-5 (*)    Bacteria, UA FEW (*)    All other components within normal limits  I-STAT CHEM 8, ED - Abnormal; Notable for the following:    BUN 59 (*)    Creatinine, Ser 2.50 (*)    Glucose, Bld 437 (*)    All other components within normal limits  CBG MONITORING, ED - Abnormal; Notable for the following:    Glucose-Capillary 429 (*)    All other components within normal limits  CBG MONITORING, ED - Abnormal; Notable for the following:    Glucose-Capillary 416 (*)    All other components within normal  limits  CBG MONITORING, ED - Abnormal; Notable for the following:    Glucose-Capillary 250 (*)    All other components within normal limits  ETHANOL  APTT  URINE RAPID DRUG SCREEN, HOSP PERFORMED  BASIC METABOLIC PANEL  HEMOGLOBIN B1Y  BASIC METABOLIC PANEL  BASIC METABOLIC PANEL  BASIC METABOLIC PANEL  BASIC METABOLIC PANEL  CBC  MAGNESIUM  TSH  TSH  I-STAT [place], ED    Imaging Review Ct Head Wo Contrast  06/12/2015  CLINICAL DATA:  New onset seizure EXAM: CT HEAD WITHOUT CONTRAST TECHNIQUE: Contiguous axial images were obtained from the base of the skull through the vertex without intravenous contrast. COMPARISON:  None. FINDINGS: Skull and Sinuses:Negative for fracture or destructive process. The visualized mastoids, middle ears, and imaged paranasal sinuses are clear. Orbits: Bilateral cataract resection.  No acute finding. Brain: No evidence of acute infarction, hemorrhage, hydrocephalus, or mass lesion/mass effect. Generalized cerebral volume loss which is normal for age. No cortical findings to explain seizure. IMPRESSION: No acute finding or explanation for seizure. Electronically Signed   By: Monte Fantasia M.D.   On: 06/12/2015 11:09   I have personally reviewed and evaluated these images and lab results as part of my medical decision-making.   EKG Interpretation   Date/Time:  Thursday June 12 2015 14:48:57 EST Ventricular Rate:  76 PR Interval:  217 QRS Duration: 101 QT Interval:  421 QTC Calculation: 473 R Axis:   16 Text Interpretation:  Sinus rhythm Borderline prolonged PR interval LVH  with secondary repolarization abnormality Inferior infarct, old no  significant change since 2009 Confirmed by Shedrick Sarli  MD, Mauri Temkin ([id number]) on  06/12/2015 4:21:43 PM      MDM   Final diagnoses:  Seizures ([place])    74 year old female with new onset seizures. Also has hyperglycemia. Patient initially seizing on my evaluation, postictal for a few minutes, and gradually  returned to baseline. After  this her neuro exam was normal. No signs of infectious etiology and no headache or strokelike symptoms. Labs show acidosis with hyperglycemia. However there are no ketones in her urine. I think the acidosis is more because she had a seizure right before blood was drawn. However she will be given fluids. Medicine to start on insulin drip. Neuro has seen her and will do an EEG and patient will be observed given she lives home alone with new onset seizures.    Sherwood Gambler, MD 06/12/15 1630

## 2015-06-12 NOTE — Progress Notes (Signed)
EEG completed; results pending.    

## 2015-06-12 NOTE — ED Notes (Signed)
From home, reported seizure at home while in shower, 1-2 min, was A/O X4 with normal neuro exam on arrival, pt then had 1-2 min generalized seizure in room, no oral trauma, resp compromise or incontinence, pt back to baseline after, 1mg  Ativan given per Dr Verta Ellen

## 2015-06-12 NOTE — Progress Notes (Signed)
MRI called to transport patient, I am currently unable to travel with patient to MRI due to patient needs.  Will reschedule as soon as possible for MRI brain.

## 2015-06-12 NOTE — ED Notes (Signed)
EEG at bedside.

## 2015-06-12 NOTE — Procedures (Signed)
ELECTROENCEPHALOGRAM REPORT  Patient: Emily Hughes       Room #: ED 25 EEG No. ID: [id number] Age: 74 y.o.        Sex: female Referring Physician: Rudolpho Sevin Report Date:  06/12/2015        Interpreting Physician: Anthony Sar  History: Tewanda Duitsman is an 74 y.o. female with a history of diabetes mellitus, hypertension, chronic kidney disease and coronary artery disease presenting with focal left motor seizure activity with secondary generalization.  Indications for study:  Rule out seizure disorder.  Technique: This is an 18 channel routine scalp EEG performed at the bedside with bipolar and monopolar montages arranged in accordance to the international 10/20 system of electrode placement.   Description: This EEG recording was performed during wakefulness and during sleep. Predominant activity during wakefulness consisted of 10-11  Hz alpha rhythm with good attenuation with eye opening. Diffuse low amplitude fast beta activity was also noted. Photic stimulation produced symmetrical occipital driving response.  Hyperventilation was not performed. There was symmetrical slowing of background activity with mixed irregular delta and theta activity diffusely during sleep. Symmetrical vertex waves, sleep spindles and K-complexes are recorded during stage II of sleep. No epileptiform discharges were recorded. There was no abnormal slowing of cerebral activity.  Interpretation: This is a normal EEG recording during wakefulness and during sleep. No evidence of an epileptic disorder was demonstrated. However, a normal EEG recording in and of itself does not rule out seizure disorder.   Rush Farmer M.D. [facility] [phone]

## 2015-06-13 ENCOUNTER — Inpatient Hospital Stay (HOSPITAL_COMMUNITY): Payer: PPO

## 2015-06-13 DIAGNOSIS — E1310 Other specified diabetes mellitus with ketoacidosis without coma: Secondary | ICD-10-CM

## 2015-06-13 DIAGNOSIS — I10 Essential (primary) hypertension: Secondary | ICD-10-CM

## 2015-06-13 DIAGNOSIS — N184 Chronic kidney disease, stage 4 (severe): Secondary | ICD-10-CM

## 2015-06-13 LAB — GLUCOSE, CAPILLARY
GLUCOSE-CAPILLARY: 91 mg/dL (ref 65–99)
Glucose-Capillary: 267 mg/dL — ABNORMAL HIGH (ref 65–99)
Glucose-Capillary: 327 mg/dL — ABNORMAL HIGH (ref 65–99)
Glucose-Capillary: 195 mg/dL — ABNORMAL HIGH (ref 65–99)

## 2015-06-13 LAB — CBC
HCT: 27.8 % — ABNORMAL LOW (ref 36.0–46.0)
Hemoglobin: 9.2 g/dL — ABNORMAL LOW (ref 12.0–15.0)
MCH: 28.8 pg (ref 26.0–34.0)
MCHC: 33.1 g/dL (ref 30.0–36.0)
MCV: 86.9 fL (ref 78.0–100.0)
Platelets: 249 K/uL (ref 150–400)
RBC: 3.2 MIL/uL — ABNORMAL LOW (ref 3.87–5.11)
RDW: 14.3 % (ref 11.5–15.5)
WBC: 7.6 K/uL (ref 4.0–10.5)

## 2015-06-13 LAB — HEMOGLOBIN A1C
Hgb A1c MFr Bld: 11.4 % — ABNORMAL HIGH (ref 4.8–5.6)
Mean Plasma Glucose: 280 mg/dL

## 2015-06-13 NOTE — Progress Notes (Signed)
PROGRESS NOTE  Emily Hughes [id number] DOB: Jan 16, 1941 DOA: 06/12/2015 PCP: Odette Fraction, MD Brief History 74 year old female with a history of diabetes mellitus, hyperlipidemia, hypertension, CKD stage III, coronary artery disease presented with elevated blood sugars and seizures. On the day of admission, the patient described her left hand stiffening up and curled up. She subsequently developed some dizziness dropping to her knees without frank syncope. In emergency department, the patient was witnessed to have a tonic-clonic seizure. Neurology was consulted, and the patient was started on Vimpat. At the time of admission, the patient was noted to have a CBG of 460 with anion gap of 18. She was started on an insulin drip and has been transitioned to subcutaneous insulin. She endorses compliance with her insulin, but has had some dietary indiscretion.  Assessment/Plan: New-onset seizure -No further seizures since admission  -Appreciate neurology consultation  -MRI brain negative for acute findings  -CT brain negative for acute findings  - EEG without any upper lip the form discharges - Continue Vimpat - I have informed the patient that she will not be able to drive for at least the next 6 months - Urine drug screen negative DKA and type 2 diabetes mellitus -Transitioned to subcutaneous insulin  -06/12/2015 hemoglobin A1c 11.4  - give half home dose of Lantus and adjust as necessary based on CBGs  CKD stage IV  -Baseline creatinine 2.5-2.8  -Continue calcitriol  -Continue bicarbonate  Hypertension  -Continue diltiazem, metoprolol succinate  -Hesitate to restart ARB in the setting CKD stage IV Hyperlipidemia -Continue pravastatin Coronary artery disease -No anginal symptoms presently -EKG without concerning ischemic changes -Continue aspirin   Family Communication:   Pt at beside Disposition Plan:   Home 1-2 days       Procedures/Studies: Dg Chest 2  View  06/12/2015  CLINICAL DATA:  Diabetic with seizures for 1 day.  Ex-smoker. EXAM: CHEST  2 VIEW COMPARISON:  01/08/2008 and 08/09/2006 radiographs. FINDINGS: The heart size and mediastinal contours are stable. There is suboptimal inspiration on the lateral view, although the subsegmental atelectasis previously demonstrated at both lung bases has resolved. The lungs are now clear. There is no pleural effusion or pneumothorax. Degenerative changes are present throughout the spine. IMPRESSION: No active cardiopulmonary process. Resolution of bibasilar atelectasis demonstrated previously. Electronically Signed   By: Richardean Sale M.D.   On: 06/12/2015 16:46   Ct Head Wo Contrast  06/12/2015  CLINICAL DATA:  New onset seizure EXAM: CT HEAD WITHOUT CONTRAST TECHNIQUE: Contiguous axial images were obtained from the base of the skull through the vertex without intravenous contrast. COMPARISON:  None. FINDINGS: Skull and Sinuses:Negative for fracture or destructive process. The visualized mastoids, middle ears, and imaged paranasal sinuses are clear. Orbits: Bilateral cataract resection.  No acute finding. Brain: No evidence of acute infarction, hemorrhage, hydrocephalus, or mass lesion/mass effect. Generalized cerebral volume loss which is normal for age. No cortical findings to explain seizure. IMPRESSION: No acute finding or explanation for seizure. Electronically Signed   By: Monte Fantasia M.D.   On: 06/12/2015 11:09   Mr Brain Wo Contrast  06/13/2015  CLINICAL DATA:  New onset seizure today, LEFT arm shaking and stiffness. Did not lose consciousness. History of hypertension, hypercholesterolemia and diabetes. EXAM: MRI HEAD WITHOUT CONTRAST TECHNIQUE: Multiplanar, multiecho pulse sequences of the brain and surrounding structures were obtained without intravenous contrast. COMPARISON:  CT head June 12, 2015 FINDINGS: The ventricles and sulci are normal for  patient's age. Cavum septum pellucidum is a  normal variant. No abnormal parenchymal signal, mass lesions, mass effect. Mild nonspecific midbrain volume loss, without tectal atrophy. No reduced diffusion to suggest acute ischemia. A few scattered subcentimeter supratentorial white matter FLAIR T2 hyperintensities are less than expected for age, most commonly seen with chronic small vessel ischemic disease. Small bilateral inferior basal ganglia perivascular spaces. No susceptibility artifact to suggest hemorrhage. No abnormal extra-axial fluid collections. No extra-axial masses though, contrast enhanced sequences would be more sensitive. Normal major intracranial vascular flow voids seen at the skull base. Ocular globes and orbital contents are nonsuspicious though not tailored for evaluation. Status post bilateral ocular lens implants. No abnormal sellar expansion. Visualized paranasal sinuses and mastoid air cells are well-aerated. No suspicious calvarial bone marrow signal. Craniocervical junction maintained. RIGHT suboccipital scalp scarring. IMPRESSION: No acute intracranial process. Mild nonspecific midbrain volume loss without corroborative findings of progressive supranuclear palsy. Electronically Signed   By: Elon Alas M.D.   On: 06/13/2015 02:25        Subjective: Patient denies fevers, chills, headache, chest pain, dyspnea, nausea, vomiting, diarrhea, abdominal pain, dysuria, hematuria   Objective: Filed Vitals:   06/12/15 2100 06/12/15 2200 06/13/15 0000 06/13/15 0406  BP: 121/55 144/69 130/66 127/53  Pulse: 66 66 64 66  Temp:   98 F (36.7 C) 97.7 F (36.5 C)  TempSrc:   Oral Oral  Resp: 15 19 19 18   Height:      Weight:      SpO2: 100% 100% 97% 100%    Intake/Output Summary (Last 24 hours) at 06/13/15 0931 Last data filed at 06/13/15 0600  Gross per 24 hour  Intake    540 ml  Output    800 ml  Net   -260 ml   Weight change:  Exam:   General:  Pt is alert, follows commands appropriately, not in acute  distress  HEENT: No icterus, No thrush, No neck mass, [place]/AT  Cardiovascular: RRR, S1/S2, no rubs, no gallops  Respiratory: CTA bilaterally, no wheezing, no crackles, no rhonchi  Abdomen: Soft/+BS, non tender, non distended, no guarding; no HSM  Extremities: 1+LE edema, No lymphangitis, No petechiae, No rashes, no synovitis; no cyanosis or clubbing  Data Reviewed: Basic Metabolic Panel:  Recent Labs Lab 06/12/15 1002 06/12/15 1027 06/12/15 1715 06/12/15 2145  NA 136 137 138 140  K 3.5 3.5 5.2* 4.4  CL 101 101 105 107  CO2 17*  --  26 25  GLUCOSE 441* 437* 161* 131*  BUN 52* 59* 47* 48*  CREATININE 2.80* 2.50* 2.51* 2.56*  CALCIUM 9.8  --  9.4 8.9  MG  --   --  2.2  --    Liver Function Tests:  Recent Labs Lab 06/12/15 1002  AST 22  ALT 17  ALKPHOS 92  BILITOT 0.6  PROT 7.9  ALBUMIN 3.6   No results for input(s): LIPASE, AMYLASE in the last 168 hours. No results for input(s): AMMONIA in the last 168 hours. CBC:  Recent Labs Lab 06/12/15 1002 06/12/15 1027 06/12/15 1715 06/13/15 0308  WBC 14.4*  --  9.4 7.6  NEUTROABS 6.0  --   --   --   HGB 9.9* 14.6 9.6* 9.2*  HCT 30.9* 43.0 29.7* 27.8*  MCV 87.5  --  86.3 86.9  PLT 318  --  252 249   Cardiac Enzymes: No results for input(s): CKTOTAL, CKMB, CKMBINDEX, TROPONINI in the last 168 hours. BNP: Invalid input(s): POCBNP CBG:  Recent Labs Lab 06/12/15 1700 06/12/15 1758 06/12/15 1902 06/12/15 2039 06/13/15 0829  GLUCAP 163* 123* 81 133* 267*    Recent Results (from the past 240 hour(s))  MRSA PCR Screening     Status: None   Collection Time: 06/12/15  4:57 PM  Result Value Ref Range Status   MRSA by PCR NEGATIVE NEGATIVE Final    Comment:        The GeneXpert MRSA Assay (FDA approved for NASAL specimens only), is one component of a comprehensive MRSA colonization surveillance program. It is not intended to diagnose MRSA infection nor to guide or monitor treatment for MRSA infections.        Scheduled Meds: . allopurinol  300 mg Oral Daily  . aspirin EC  81 mg Oral Daily  . calcitRIOL  0.25 mcg Oral Daily  . diltiazem  300 mg Oral Daily  . heparin  5,000 Units Subcutaneous 3 times per day  . insulin aspart  0-15 Units Subcutaneous TID WC  . insulin aspart  0-5 Units Subcutaneous QHS  . insulin glargine  15 Units Subcutaneous Daily  . lacosamide  100 mg Oral BID  . levothyroxine  50 mcg Oral QAC breakfast  . loratadine  10 mg Oral Daily  . metoprolol succinate  25 mg Oral Daily  . pantoprazole  40 mg Oral Daily  . pravastatin  80 mg Oral Daily  . sodium bicarbonate  650 mg Oral QID  . sodium chloride  3 mL Intravenous Q12H   Continuous Infusions:    Elzy Tomasello, DO  [facility] Pager [phone]  If 7PM-7AM, please contact night-coverage [email or web address] Password [email or web address] 06/13/2015, 9:31 AM   LOS: 1 day

## 2015-06-13 NOTE — Evaluation (Signed)
Occupational Therapy Evaluation Patient Details Name: Emily Hughes MRN: [id number] DOB: 06-30-1941 Today's Date: 06/13/2015    History of Present Illness Emily Hughes is an 73 y.o. female with a history of diabetes mellitus, hypertension, chronic kidney disease and coronary artery disease presenting with focal left motor seizure activity with secondary generalization   Clinical Impression   Pt was performing ADL at a modified independent level and ambulating without a device, although she reports difficulty ambulating due to arthritic knees and a bad back.  Pt overall performed pivot transfers x 2 and ADL with supervision. Lives alone, will follow acutely.    Follow Up Recommendations  No OT follow up    Equipment Recommendations  None recommended by OT (pt does not want a 3 in 1)    Recommendations for Other Services       Precautions / Restrictions Precautions Precautions: Fall Precaution Comments: seizure precautions Restrictions Weight Bearing Restrictions: No      Mobility Bed Mobility Overal bed mobility: Independent             General bed mobility comments: returned to bed, HOB flat  Transfers Overall transfer level: Needs assistance Equipment used: None Transfers: Sit to/from [employer] Transfers Sit to Stand: Supervision Stand pivot transfers: Supervision       General transfer comment: no difficulty rising from recliner or 3 in 1, uses UEs, reports arthritic knees and back    Balance Overall balance assessment: Needs assistance;History of Falls Sitting-balance support: No upper extremity supported;Feet supported Sitting balance-Leahy Scale: Good     Standing balance support: Bilateral upper extremity supported;During functional activity Standing balance-Leahy Scale: Poor Standing balance comment: Pt relies on RW for UE support.                              ADL Overall ADL's : Needs assistance/impaired Eating/Feeding:  Independent;Sitting   Grooming: Wash/dry hands;Supervision/safety;Standing   Upper Body Bathing: Set up;Sitting   Lower Body Bathing: Supervison/ safety;Sit to/from stand   Upper Body Dressing : Set up;Sitting   Lower Body Dressing: Supervision/safety;Sit to/from stand   Toilet Transfer: RW;Stand-pivot;Supervision/safety   Toileting- Water quality scientist and Hygiene: Supervision/safety;Sit to/from stand               Vision     Perception     Praxis      Pertinent Vitals/Pain Pain Assessment: No/denies pain     Hand Dominance Right   Extremity/Trunk Assessment Upper Extremity Assessment Upper Extremity Assessment: Overall WFL for tasks assessed   Lower Extremity Assessment Lower Extremity Assessment: Defer to PT evaluation   Cervical / Trunk Assessment Cervical / Trunk Assessment: Normal   Communication Communication Communication: No difficulties   Cognition Arousal/Alertness: Awake/alert Behavior During Therapy: WFL for tasks assessed/performed Overall Cognitive Status: Within Functional Limits for tasks assessed                     General Comments       Exercises       Shoulder Instructions      Home Living Family/patient expects to be discharged to:: Private residence Living Arrangements: Alone Available Help at Discharge: Family;Available PRN/intermittently Type of Home: House Home Access: Stairs to enter [employer] of Steps: 3 Entrance Stairs-Rails: None Home Layout: One level     Bathroom Shower/Tub: Teacher, early years/pre: Standard Bathroom Accessibility: Yes   Home Equipment: Civil engineer, contracting  Prior Functioning/Environment Level of Independence: Independent             OT Diagnosis: Generalized weakness   OT Problem List: Impaired balance (sitting and/or standing);Decreased activity tolerance   OT Treatment/Interventions: Self-care/ADL training;Patient/family education;DME and/or  AE instruction    OT Goals(Current goals can be found in the care plan section) Acute Rehab OT Goals Patient Stated Goal: to go home OT Goal Formulation: With patient Time For Goal Achievement: 06/20/15 Potential to Achieve Goals: Good ADL Goals Pt Will Perform Grooming: with modified independence;standing Pt Will Perform Lower Body Bathing: with modified independence;sit to/from stand Pt Will Perform Lower Body Dressing: with modified independence;sit to/from stand Pt Will Transfer to Toilet: with modified independence;ambulating;regular height toilet Pt Will Perform Toileting - Clothing Manipulation and hygiene: with modified independence;sit to/from stand Pt Will Perform Tub/Shower Transfer: Tub transfer;with modified independence;ambulating;shower seat;rolling walker  OT Frequency: Min 2X/week   Barriers to D/C:            Co-evaluation              End of Session    Activity Tolerance: Patient limited by fatigue Patient left: in bed;with call bell/phone within reach;with bed alarm set   Time: [id number] OT Time Calculation (min): 21 min Charges:  OT General Charges $OT Visit: 1 Procedure OT Evaluation $Initial OT Evaluation Tier I: 1 Procedure G-Codes:    Malka So 06/13/2015, 11:09 AM [phone]

## 2015-06-13 NOTE — Progress Notes (Signed)
Subjective: Sitting in chair, family at bedside.  Reports she feels overall well, slightly fatigued due to the flury of tests in the hospital.  She denies any further seizure activity.  Objective: Current vital signs: BP 142/66 mmHg  Pulse 81  Temp(Src) 98.1 F (36.7 C) (Oral)  Resp 18  Ht 5\' 3"  (1.6 m)  Wt 249 lb 12.8 oz (113.309 kg)  BMI 44.26 kg/m2  SpO2 100% Vital signs in last 24 hours: Temp:  [97.7 F (36.5 C)-98.2 F (36.8 C)] 98.1 F (36.7 C) (12/09 [id number]) Pulse Rate:  [64-84] 81 (12/09 0812) Resp:  [15-22] 18 (12/09 0812) BP: (111-145)/(53-79) 142/66 mmHg (12/09 0812) SpO2:  [97 %-100 %] 100 % (12/09 0812) Weight:  [249 lb 12.8 oz (113.309 kg)] 249 lb 12.8 oz (113.309 kg) (12/08 1658)  Intake/Output from previous day: 12/08 0701 - 12/09 0700 In: 540 [P.O.:240; I.V.:300] Out: 800 [Urine:800] Intake/Output this shift: Total I/O In: -  Out: 300 [Urine:300] Nutritional status: Diet heart healthy/carb modified Room service appropriate?: Yes; Fluid consistency:: Thin  Neurologic Exam: General: NAD Mental Status: Alert, oriented, thought content appropriate.  Speech fluent without evidence of aphasia.   Cranial Nerves: [id number] fields grossly normal, pupils equal, round, reactive to light and accommodation III,IV, VI: ptosis not present, extra-ocular motions intact bilaterally V,VII: smile symmetric, facial light touch sensation normal bilaterally VIII: hearing normal bilaterally IX,X: uvula rises symmetrically XI: bilateral shoulder shrug XII: midline tongue extension without atrophy or fasciculations  Motor: Right : Upper extremity   5/5    Left:     Upper extremity   5/5  Lower extremity   5/5     Lower extremity   5/5 Tone and bulk:normal tone throughout; no atrophy noted Sensory:  light touch intact throughout, bilaterally Deep Tendon Reflexes:  2+ and symmetric bilaterally Plantars: Right: downgoing   Left: downgoing Cerebellar: normal  finger-to-nose Gait: not assessed CV: RRR, pulses palpable throughout     Lab Results: Basic Metabolic Panel:  Recent Labs Lab 06/12/15 1002 06/12/15 1027 06/12/15 1715 06/12/15 2145  NA 136 137 138 140  K 3.5 3.5 5.2* 4.4  CL 101 101 105 107  CO2 17*  --  26 25  GLUCOSE 441* 437* 161* 131*  BUN 52* 59* 47* 48*  CREATININE 2.80* 2.50* 2.51* 2.56*  CALCIUM 9.8  --  9.4 8.9  MG  --   --  2.2  --     Liver Function Tests:  Recent Labs Lab 06/12/15 1002  AST 22  ALT 17  ALKPHOS 92  BILITOT 0.6  PROT 7.9  ALBUMIN 3.6   No results for input(s): LIPASE, AMYLASE in the last 168 hours. No results for input(s): AMMONIA in the last 168 hours.  CBC:  Recent Labs Lab 06/12/15 1002 06/12/15 1027 06/12/15 1715 06/13/15 0308  WBC 14.4*  --  9.4 7.6  NEUTROABS 6.0  --   --   --   HGB 9.9* 14.6 9.6* 9.2*  HCT 30.9* 43.0 29.7* 27.8*  MCV 87.5  --  86.3 86.9  PLT 318  --  252 249    Cardiac Enzymes: No results for input(s): CKTOTAL, CKMB, CKMBINDEX, TROPONINI in the last 168 hours.  Lipid Panel: No results for input(s): CHOL, TRIG, HDL, CHOLHDL, VLDL, LDLCALC in the last 168 hours.  CBG:  Recent Labs Lab 06/12/15 1700 06/12/15 1758 06/12/15 1902 06/12/15 2039 06/13/15 0829  GLUCAP 163* 123* 81 133* 267*    Microbiology: Results for orders placed or performed  during the hospital encounter of 06/12/15  MRSA PCR Screening     Status: None   Collection Time: 06/12/15  4:57 PM  Result Value Ref Range Status   MRSA by PCR NEGATIVE NEGATIVE Final    Comment:        The GeneXpert MRSA Assay (FDA approved for NASAL specimens only), is one component of a comprehensive MRSA colonization surveillance program. It is not intended to diagnose MRSA infection nor to guide or monitor treatment for MRSA infections.     Coagulation Studies:  Recent Labs  06/12/15 1028  LABPROT 15.3*  INR 1.20    Imaging: Dg Chest 2 View  06/12/2015  CLINICAL DATA:   Diabetic with seizures for 1 day.  Ex-smoker. EXAM: CHEST  2 VIEW COMPARISON:  01/08/2008 and 08/09/2006 radiographs. FINDINGS: The heart size and mediastinal contours are stable. There is suboptimal inspiration on the lateral view, although the subsegmental atelectasis previously demonstrated at both lung bases has resolved. The lungs are now clear. There is no pleural effusion or pneumothorax. Degenerative changes are present throughout the spine. IMPRESSION: No active cardiopulmonary process. Resolution of bibasilar atelectasis demonstrated previously. Electronically Signed   By: Richardean Sale M.D.   On: 06/12/2015 16:46   Ct Head Wo Contrast  06/12/2015  CLINICAL DATA:  New onset seizure EXAM: CT HEAD WITHOUT CONTRAST TECHNIQUE: Contiguous axial images were obtained from the base of the skull through the vertex without intravenous contrast. COMPARISON:  None. FINDINGS: Skull and Sinuses:Negative for fracture or destructive process. The visualized mastoids, middle ears, and imaged paranasal sinuses are clear. Orbits: Bilateral cataract resection.  No acute finding. Brain: No evidence of acute infarction, hemorrhage, hydrocephalus, or mass lesion/mass effect. Generalized cerebral volume loss which is normal for age. No cortical findings to explain seizure. IMPRESSION: No acute finding or explanation for seizure. Electronically Signed   By: Monte Fantasia M.D.   On: 06/12/2015 11:09   Mr Brain Wo Contrast  06/13/2015  CLINICAL DATA:  New onset seizure today, LEFT arm shaking and stiffness. Did not lose consciousness. History of hypertension, hypercholesterolemia and diabetes. EXAM: MRI HEAD WITHOUT CONTRAST TECHNIQUE: Multiplanar, multiecho pulse sequences of the brain and surrounding structures were obtained without intravenous contrast. COMPARISON:  CT head June 12, 2015 FINDINGS: The ventricles and sulci are normal for patient's age. Cavum septum pellucidum is a normal variant. No abnormal  parenchymal signal, mass lesions, mass effect. Mild nonspecific midbrain volume loss, without tectal atrophy. No reduced diffusion to suggest acute ischemia. A few scattered subcentimeter supratentorial white matter FLAIR T2 hyperintensities are less than expected for age, most commonly seen with chronic small vessel ischemic disease. Small bilateral inferior basal ganglia perivascular spaces. No susceptibility artifact to suggest hemorrhage. No abnormal extra-axial fluid collections. No extra-axial masses though, contrast enhanced sequences would be more sensitive. Normal major intracranial vascular flow voids seen at the skull base. Ocular globes and orbital contents are nonsuspicious though not tailored for evaluation. Status post bilateral ocular lens implants. No abnormal sellar expansion. Visualized paranasal sinuses and mastoid air cells are well-aerated. No suspicious calvarial bone marrow signal. Craniocervical junction maintained. RIGHT suboccipital scalp scarring. IMPRESSION: No acute intracranial process. Mild nonspecific midbrain volume loss without corroborative findings of progressive supranuclear palsy. Electronically Signed   By: Elon Alas M.D.   On: 06/13/2015 02:25    Medications: I have reviewed the patient's current medications.  Assessment/Plan: 74 yo with DM, CKD4, HTN who presented with partial seizure and had witnessed secondary generalization in the  ED.  New onset partial seizures with secondary generalization - CT/MRI without acute findings or focus.  Patient presented with mild DKA which has resolved.  No further seizure activity overnight. - Continue Vimpat 100mg  BID    Lucious Groves, DO [facility] PGY-3 Pager: [phone]  06/13/2015, 10:18 AM

## 2015-06-13 NOTE — Care Management Note (Signed)
Case Management Note  Patient Details  Name: Emily Hughes MRN: [id number] Date of Birth: 1940/08/05  Subjective/Objective:     PT recommends home health therapy and rolling walker.  Pt initially states that she does not feel she needs home health PT but then states she will agree since therapist recommended it - already has walker @ home.  Provided list of home health agencies, referral to [facility] per request.                            Expected Discharge Plan:  [place]  Discharge planning Services  CM Consult  Post Acute Care Choice:  Home Health Choice offered to:  Patient  HH Arranged:  PT [place]:  [place]  Status of Service:  Completed, signed off  Girard Cooter, [place] 06/13/2015, 3:18 PM

## 2015-06-13 NOTE — Progress Notes (Signed)
Utilization review completed.  

## 2015-06-13 NOTE — Progress Notes (Signed)
NURSING PROGRESS NOTE  Emily Hughes [id number] Transfer Data: 06/13/2015 7:51 PM Attending Provider: Orson Eva, MD [id number] TOM, MD Code Status: Full   Emily Hughes is a 74 y.o. female patient transferred from [place]  -No acute distress noted.  -No complaints of shortness of breath.  -No complaints of chest pain.   Cardiac Monitoring: Box # 08 in place. Cardiac monitor yields:normal sinus rhythm.  Last Documented Vital Signs: Blood pressure 121/51, pulse 62, temperature 98.4 F (36.9 C), temperature source Oral, resp. rate 18, height 5\' 3"  (1.6 m), weight 113.309 kg (249 lb 12.8 oz), SpO2 100 %.  IV Fluids:  IV in place, occlusive dsg intact without redness, IV cath wrist right and left AC, condition patent and no redness none.   Allergies:  Ace inhibitors; Actos; Dust mite extract; and Dye fdc red  Past Medical History:   has a past medical history of High cholesterol; Hypertension; Anemia; CAD (coronary artery disease); Pancreatitis; Colon polyps; Proteinuria; DDD (degenerative disc disease); CRI (chronic renal insufficiency); GERD (gastroesophageal reflux disease); and Diabetes mellitus.  Past Surgical History:   has past surgical history that includes Back surgery; Ankle surgery; Tonsillectomy; and Angioplasty.  Social History:   reports that she has quit smoking. She has never used smokeless tobacco. She reports that she does not drink alcohol or use illicit drugs.  Skin: Intact  Patient/Family orientated to room. Information packet given to patient/family. Admission inpatient armband information verified with patient/family to include name and date of birth and placed on patient arm. Side rails up x 2, fall assessment and education completed with patient/family. Patient/family able to verbalize understanding of risk associated with falls and verbalized understanding to call for assistance before getting out of bed. Call light within reach. Patient/family able to voice  and demonstrate understanding of unit orientation instructions.

## 2015-06-13 NOTE — Progress Notes (Signed)
Report called and received from RN on [facility].

## 2015-06-13 NOTE — Evaluation (Signed)
Physical Therapy Evaluation Patient Details Name: Emily Hughes MRN: [id number] DOB: 07/10/1940 Today's Date: 06/13/2015   History of Present Illness  Emily Hughes is an 74 y.o. female with a history of diabetes mellitus, hypertension, chronic kidney disease and coronary artery disease presenting with focal left motor seizure activity with secondary generalization  Clinical Impression  Pt admitted with above diagnosis. Pt currently with functional limitations due to the deficits listed below (see PT Problem List). Pt able to ambulate with RW with min guard to min assist.  Drifts to left occasionally.  If home, will need 24 hour care, HHPT, HHOT and RW as well as 3N1.   Pt will benefit from skilled PT to increase their independence and safety with mobility to allow discharge to the venue listed below.      Follow Up Recommendations Home health PT;Supervision/Assistance - 24 hour (states her sister can help initially 24 hours, HHOT)    Equipment Recommendations  Rolling walker with 5" wheels;3in1 (PT)    Recommendations for Other Services       Precautions / Restrictions Precautions Precautions: Fall Restrictions Weight Bearing Restrictions: No      Mobility  Bed Mobility Overal bed mobility: Independent                Transfers Overall transfer level: Needs assistance Equipment used: Rolling walker (2 wheeled) Transfers: Sit to/from Stand Sit to Stand: Min assist;From elevated surface         General transfer comment: Took pt incr time and cues for hand placement.  Pt had to use momentum to get up with several attempts.   Ambulation/Gait Ambulation/Gait assistance: Min assist Ambulation Distance (Feet): 125 Feet Assistive device: Rolling walker (2 wheeled) Gait Pattern/deviations: Step-through pattern;Decreased stride length;Wide base of support;Drifts right/left   Gait velocity interpretation: Below normal speed for age/gender General Gait Details: Pt able  to ambulate with RW in hallway.  Pt needed cues for sequencing steps and Rw.  Pt at times steering to left.  Pt without LOB with RW but not challenged.    Stairs            Wheelchair Mobility    Modified Rankin (Stroke Patients Only)       Balance Overall balance assessment: Needs assistance;History of Falls Sitting-balance support: No upper extremity supported;Feet supported Sitting balance-Leahy Scale: Fair     Standing balance support: Bilateral upper extremity supported;During functional activity Standing balance-Leahy Scale: Poor Standing balance comment: Pt relies on RW for UE support.                               Pertinent Vitals/Pain Pain Assessment: No/denies pain  65 bpm, 16, 142/66 pre BP, 151/65 post BP, 99% RA.     Home Living Family/patient expects to be discharged to:: Private residence Living Arrangements: Alone Available Help at Discharge: Family;Available PRN/intermittently (states sister can assist some) Type of Home: House Home Access: Stairs to enter Entrance Stairs-Rails: None Entrance Stairs-Number of Steps: 3 Home Layout: One level Home Equipment: Shower seat      Prior Function Level of Independence: Independent               Hand Dominance   Dominant Hand: Right    Extremity/Trunk Assessment   Upper Extremity Assessment: Defer to OT evaluation           Lower Extremity Assessment: Generalized weakness      Cervical / Trunk Assessment: Normal  Communication   Communication: No difficulties  Cognition Arousal/Alertness: Awake/alert Behavior During Therapy: WFL for tasks assessed/performed Overall Cognitive Status: Within Functional Limits for tasks assessed                      General Comments      Exercises        Assessment/Plan    PT Assessment Patient needs continued PT services  PT Diagnosis Generalized weakness   PT Problem List Decreased mobility;Decreased balance;Decreased  activity tolerance;Decreased knowledge of use of DME;Decreased safety awareness;Decreased knowledge of precautions  PT Treatment Interventions DME instruction;Gait training;Therapeutic activities;Functional mobility training;Therapeutic exercise;Balance training;Patient/family education   PT Goals (Current goals can be found in the Care Plan section) Acute Rehab PT Goals Patient Stated Goal: to go home PT Goal Formulation: With patient Time For Goal Achievement: 06/27/15 Potential to Achieve Goals: Good    Frequency Min 3X/week   Barriers to discharge        Co-evaluation               End of Session Equipment Utilized During Treatment: Gait belt Activity Tolerance: Patient limited by fatigue Patient left: in chair;with call bell/phone within reach;with chair alarm set Nurse Communication: Mobility status         Time: [id number] PT Time Calculation (min) (ACUTE ONLY): 16 min   Charges:   PT Evaluation $Initial PT Evaluation Tier I: 1 Procedure     PT G CodesDenice Paradise 07-05-15, 9:08 AM [facility] [phone] (pager)

## 2015-06-14 DIAGNOSIS — D72829 Elevated white blood cell count, unspecified: Secondary | ICD-10-CM | POA: Insufficient documentation

## 2015-06-14 LAB — BASIC METABOLIC PANEL
Anion gap: 10 (ref 5–15)
BUN: 47 mg/dL — ABNORMAL HIGH (ref 6–20)
CALCIUM: 9.4 mg/dL (ref 8.9–10.3)
CHLORIDE: 105 mmol/L (ref 101–111)
CO2: 24 mmol/L (ref 22–32)
CREATININE: 2.74 mg/dL — AB (ref 0.44–1.00)
GFR, EST AFRICAN AMERICAN: 19 mL/min — AB (ref >60)
GFR, EST NON AFRICAN AMERICAN: 16 mL/min — AB (ref >60)
Glucose, Bld: 190 mg/dL — ABNORMAL HIGH (ref 65–99)
Potassium: 4.6 mmol/L (ref 3.5–5.1)
SODIUM: 139 mmol/L (ref 135–145)

## 2015-06-14 LAB — GLUCOSE, CAPILLARY
GLUCOSE-CAPILLARY: 219 mg/dL — AB (ref 65–99)
GLUCOSE-CAPILLARY: 336 mg/dL — AB (ref 65–99)

## 2015-06-14 MED ORDER — INSULIN GLARGINE 100 UNIT/ML ~~LOC~~ SOLN: 25.0000 [IU] | Freq: Every day | SUBCUTANEOUS | Status: DC

## 2015-06-14 MED ORDER — LACOSAMIDE 100 MG PO TABS
100.0000 mg | ORAL_TABLET | Freq: Two times a day (BID) | ORAL | Status: DC
Start: 2015-06-14 — End: 2015-07-24

## 2015-06-14 NOTE — Discharge Summary (Signed)
Physician Discharge Summary  Emily Hughes [id number] DOB: Jun 06, 1941 DOA: 06/12/2015  PCP: Odette Fraction, MD  Admit date: 06/12/2015 Discharge date: 06/14/2015  Recommendations for Outpatient Follow-up:  1. Pt will need to follow up with PCP in 2 weeks post discharge 2. Please obtain BMP in one week  Discharge Diagnoses:  New-onset seizure -No further seizures since admission  -Appreciate neurology consultation  -MRI brain negative for acute findings  -CT brain negative for acute findings  - EEG without any upper lip the form discharges - Continue Vimpat after d/c - I have informed the patient that she will not be able to drive for at least the next 6 months - Urine drug screen negative DKA and type 2 diabetes mellitus -Transitioned to subcutaneous insulin  -06/12/2015 hemoglobin A1c 11.4  - Patient's Lantus dose was adjusted based upon her CBGs -The patient will go home with Lantus 38 units daily CKD stage IV  -Baseline creatinine 2.5-2.8  -Continue calcitriol  -Continue bicarbonate  -We'll not restart losartan in the setting CKD stage IV  Hypertension  -Continue diltiazem, metoprolol succinate  -Hesitate to restart ARB in the setting CKD stage IV Hyperlipidemia -Continue pravastatin Coronary artery disease -No anginal symptoms presently -EKG without concerning ischemic changes -Continue aspirin  Discharge Condition: stable  Disposition: home  Diet:carb modified Wt Readings from Last 3 Encounters:  06/12/15 113.309 kg (249 lb 12.8 oz)  05/16/15 115.214 kg (254 lb)  10/24/14 116.121 kg (256 lb)    History of present illness:  74 year old female with a history of diabetes mellitus, hyperlipidemia, hypertension, CKD stage III, coronary artery disease presented with elevated blood sugars and seizures. On the day of admission, the patient described her left hand stiffening up and curled up. She subsequently developed some dizziness dropping to  her knees without frank syncope. In emergency department, the patient was witnessed to have a tonic-clonic seizure. Neurology was consulted, and the patient was started on Vimpat. At the time of admission, the patient was noted to have a CBG of 460 with anion gap of 18. She was started on an insulin drip and has been transitioned to subcutaneous insulin. She endorses compliance with her insulin, but has had some dietary indiscretion.  Consultants: Neurology  Discharge Exam: Filed Vitals:   06/14/15 1043 06/14/15 1309  BP: 136/57 138/52  Pulse: 87 79  Temp:  99.1 F (37.3 C)  Resp:  18   Filed Vitals:   06/14/15 0159 06/14/15 0615 06/14/15 1043 06/14/15 1309  BP: 146/63 141/61 136/57 138/52  Pulse: 64 68 87 79  Temp: 98.3 F (36.8 C) 98.4 F (36.9 C)  99.1 F (37.3 C)  TempSrc: Oral Oral  Oral  Resp: _0 Height:      Weight:      SpO2: 98% 94%  95%   General: A&O x 3, NAD, pleasant, cooperative Cardiovascular: RRR, no rub, no gallop, no S3 Respiratory: CTAB, no wheeze, no rhonchi Abdomen:soft, nontender, nondistended, positive bowel sounds Extremities: 1+LE edema, No lymphangitis, no petechiae  Discharge Instructions      Discharge Instructions    Ambulatory referral to Neurology    Complete by:  As directed   Refer to [facility]     Diet Carb Modified    Complete by:  As directed      Increase activity slowly    Complete by:  As directed             Medication List    STOP taking  these medications        valsartan 160 MG tablet  Commonly known as:  DIOVAN      TAKE these medications        ACCU-CHEK AVIVA PLUS W/DEVICE Kit  Dx - 250.00     ACCU-CHEK SOFTCLIX LANCETS lancets  Use as instructed     allopurinol 300 MG tablet  Commonly known as:  ZYLOPRIM  TAKE 1 TABLET ONCE DAILY.     aspirin 81 MG tablet  Take 81 mg by mouth daily.     calcitRIOL 0.25 MCG capsule  Commonly known as:  ROCALTROL  Take 0.25 mcg by mouth daily.      cetirizine 10 MG tablet  Commonly known as:  ZYRTEC  Take 1 tablet (10 mg total) by mouth daily.     cloNIDine 0.1 MG tablet  Commonly known as:  CATAPRES  Take 1 tablet (0.1 mg total) by mouth 2 (two) times daily.     diltiazem 300 MG 24 hr capsule  Commonly known as:  CARDIZEM CD  Take 1 capsule (300 mg total) by mouth daily.     esomeprazole 40 MG capsule  Commonly known as:  NEXIUM  TAKE 1 CAPSULE TWICE DAILY BEFORE MEALS     ferrous fumarate 50 MG CR tablet  Commonly known as:  FERRO-SEQUELS  Take 50 mg by mouth daily.     furosemide 40 MG tablet  Commonly known as:  LASIX  TAKE 2 TABLETS IN THE MORNING AND 1 TABLET IN THE EVENING.     glimepiride 4 MG tablet  Commonly known as:  AMARYL  Take 1 tablet (4 mg total) by mouth daily.     glucose blood test strip  Commonly known as:  ACCU-CHEK AVIVA PLUS  Checks BS 4-5 times per day, QAM, Q AC Meals, QHS - DX-250.00     insulin aspart 100 UNIT/ML FlexPen  Commonly known as:  NOVOLOG  Inject 10 Units into the skin daily with lunch.     Insulin Glargine 100 UNIT/ML Solostar Pen  Commonly known as:  LANTUS  Inject 38 Units into the skin daily.     Insulin Pen Needle 31G X 5 MM Misc  As directed for insulin use     Lacosamide 100 MG Tabs  Take 1 tablet (100 mg total) by mouth 2 (two) times daily.     levothyroxine 50 MCG tablet  Commonly known as:  SYNTHROID, LEVOTHROID  Take 1 tablet (50 mcg total) by mouth daily.     metoprolol succinate 25 MG 24 hr tablet  Commonly known as:  TOPROL-XL  TAKE (1) TABLET DAILY.     mometasone 0.1 % ointment  Commonly known as:  ELOCON  APPLY TOPICALLY DAILY.     pravastatin 80 MG tablet  Commonly known as:  PRAVACHOL  TAKE ONE TABLET AT BEDTIME.     sodium bicarbonate 650 MG tablet  Take 1 tablet (650 mg total) by mouth 4 (four) times daily.     sucralfate 1 G tablet  Commonly known as:  CARAFATE  Take 1 tablet (1 g total) by mouth 4 (four) times daily -  with meals and  at bedtime.     UNABLE TO FIND  Takes a potassium suspension daily         The results of significant diagnostics from this hospitalization (including imaging, microbiology, ancillary and laboratory) are listed below for reference.    Significant Diagnostic Studies: Dg Chest 2 View  06/12/2015  CLINICAL  DATA:  Diabetic with seizures for 1 day.  Ex-smoker. EXAM: CHEST  2 VIEW COMPARISON:  01/08/2008 and 08/09/2006 radiographs. FINDINGS: The heart size and mediastinal contours are stable. There is suboptimal inspiration on the lateral view, although the subsegmental atelectasis previously demonstrated at both lung bases has resolved. The lungs are now clear. There is no pleural effusion or pneumothorax. Degenerative changes are present throughout the spine. IMPRESSION: No active cardiopulmonary process. Resolution of bibasilar atelectasis demonstrated previously. Electronically Signed   By: Richardean Sale M.D.   On: 06/12/2015 16:46   Ct Head Wo Contrast  06/12/2015  CLINICAL DATA:  New onset seizure EXAM: CT HEAD WITHOUT CONTRAST TECHNIQUE: Contiguous axial images were obtained from the base of the skull through the vertex without intravenous contrast. COMPARISON:  None. FINDINGS: Skull and Sinuses:Negative for fracture or destructive process. The visualized mastoids, middle ears, and imaged paranasal sinuses are clear. Orbits: Bilateral cataract resection.  No acute finding. Brain: No evidence of acute infarction, hemorrhage, hydrocephalus, or mass lesion/mass effect. Generalized cerebral volume loss which is normal for age. No cortical findings to explain seizure. IMPRESSION: No acute finding or explanation for seizure. Electronically Signed   By: Monte Fantasia M.D.   On: 06/12/2015 11:09   Mr Brain Wo Contrast  06/13/2015  CLINICAL DATA:  New onset seizure today, LEFT arm shaking and stiffness. Did not lose consciousness. History of hypertension, hypercholesterolemia and diabetes. EXAM: MRI  HEAD WITHOUT CONTRAST TECHNIQUE: Multiplanar, multiecho pulse sequences of the brain and surrounding structures were obtained without intravenous contrast. COMPARISON:  CT head June 12, 2015 FINDINGS: The ventricles and sulci are normal for patient's age. Cavum septum pellucidum is a normal variant. No abnormal parenchymal signal, mass lesions, mass effect. Mild nonspecific midbrain volume loss, without tectal atrophy. No reduced diffusion to suggest acute ischemia. A few scattered subcentimeter supratentorial white matter FLAIR T2 hyperintensities are less than expected for age, most commonly seen with chronic small vessel ischemic disease. Small bilateral inferior basal ganglia perivascular spaces. No susceptibility artifact to suggest hemorrhage. No abnormal extra-axial fluid collections. No extra-axial masses though, contrast enhanced sequences would be more sensitive. Normal major intracranial vascular flow voids seen at the skull base. Ocular globes and orbital contents are nonsuspicious though not tailored for evaluation. Status post bilateral ocular lens implants. No abnormal sellar expansion. Visualized paranasal sinuses and mastoid air cells are well-aerated. No suspicious calvarial bone marrow signal. Craniocervical junction maintained. RIGHT suboccipital scalp scarring. IMPRESSION: No acute intracranial process. Mild nonspecific midbrain volume loss without corroborative findings of progressive supranuclear palsy. Electronically Signed   By: Elon Alas M.D.   On: 06/13/2015 02:25     Microbiology: Recent Results (from the past 240 hour(s))  MRSA PCR Screening     Status: None   Collection Time: 06/12/15  4:57 PM  Result Value Ref Range Status   MRSA by PCR NEGATIVE NEGATIVE Final    Comment:        The GeneXpert MRSA Assay (FDA approved for NASAL specimens only), is one component of a comprehensive MRSA colonization surveillance program. It is not intended to diagnose  MRSA infection nor to guide or monitor treatment for MRSA infections.      Labs: Basic Metabolic Panel:  Recent Labs Lab 06/12/15 1002 06/12/15 1027 06/12/15 1715 06/12/15 2145 06/14/15 0441  NA 136 137 138 140 139  K 3.5 3.5 5.2* 4.4 4.6  CL 101 101 105 107 105  CO2 17*  --  _0 GLUCOSE  441* 437* 161* 131* 190*  BUN 52* 59* 47* 48* 47*  CREATININE 2.80* 2.50* 2.51* 2.56* 2.74*  CALCIUM 9.8  --  9.4 8.9 9.4  MG  --   --  2.2  --   --    Liver Function Tests:  Recent Labs Lab 06/12/15 1002  AST 22  ALT 17  ALKPHOS 92  BILITOT 0.6  PROT 7.9  ALBUMIN 3.6   No results for input(s): LIPASE, AMYLASE in the last 168 hours. No results for input(s): AMMONIA in the last 168 hours. CBC:  Recent Labs Lab 06/12/15 1002 06/12/15 1027 06/12/15 1715 06/13/15 0308  WBC 14.4*  --  9.4 7.6  NEUTROABS 6.0  --   --   --   HGB 9.9* 14.6 9.6* 9.2*  HCT 30.9* 43.0 29.7* 27.8*  MCV 87.5  --  86.3 86.9  PLT 318  --  252 249   Cardiac Enzymes: No results for input(s): CKTOTAL, CKMB, CKMBINDEX, TROPONINI in the last 168 hours. BNP: Invalid input(s): POCBNP CBG:  Recent Labs Lab 06/13/15 1226 06/13/15 1813 06/13/15 2100 06/14/15 0746 06/14/15 1155  GLUCAP 327* 91 195* 219* 336*    Time coordinating discharge:  Greater than 30 minutes  Signed:  Jalaine Riggenbach, DO [facility] Pager: [phone] 06/14/2015, 2:28 PM

## 2015-06-14 NOTE — Progress Notes (Signed)
Emily Hughes to be D/C'd home per MD order.  Discussed with the patient and all questions fully answered.  VSS, Skin clean, dry and intact without evidence of skin break down, no evidence of skin tears noted. IV catheter discontinued intact. Site without signs and symptoms of complications. Dressing and pressure applied.  An After Visit Summary was printed and given to the patient. Patient received prescription.  D/c education completed with patient/family including follow up instructions, follow up appointments, medication list, d/c activities limitations if indicated, with other d/c instructions as indicated by MD - patient able to verbalize understanding, all questions fully answered.   Patient instructed to return to ED, call 911, or call MD for any changes in condition.   Patient escorted via [place], and D/C home via private auto.  [name] 06/14/2015 3:30 PM

## 2015-06-14 NOTE — Progress Notes (Signed)
I was contacted by RN after the patient was discharged. The patient's pharmacy contacted the floor from which the patient was discharged and stated that the patient's insurance did not pay for Vimpat. Patient is requesting a different AED.  I contacted neurology and spoke with Dr. Leonel Ramsay. He recommended starting the patient on Depakote 500 mg twice a day. I called the pharmacy and placed prescription request for Depakote 500 mg, #60, one twice a day. The patient was also instructed to follow-up with her primary care provider within the next 48-72 hours to obtain Depakote levels.  DTat

## 2015-06-16 LAB — PATHOLOGIST SMEAR REVIEW

## 2015-06-17 ENCOUNTER — Telehealth: Payer: Self-pay | Admitting: *Deleted

## 2015-06-17 NOTE — Telephone Encounter (Signed)
Received fax from [facility] stating pt has been admitted to acute care hospital   H[facility]: [facility]  Admit Date: 06/12/15  Dx:D72.829-Elevated white blood cell count,unspecified  Admitting physician: Regalado Romero,[place]  Pending authorization:[id number]

## 2015-06-17 NOTE — Telephone Encounter (Signed)
Received fax from [facility] Discharge Dispostion from Hospital  Date of admit: 06/12/15  Date of discharge: 06/14/15  Level of Care: [place]  Discharging facility: [facility]  Attending physician: Harrel Carina  PCP: Flonnie Hailstone  Discharge Dispostion:discharged to home,self care  DX: G40.319-Generalized idiopathic epilepsy,intractable wo stat ep

## 2015-06-19 ENCOUNTER — Other Ambulatory Visit: Payer: Self-pay | Admitting: Family Medicine

## 2015-06-19 ENCOUNTER — Inpatient Hospital Stay: Payer: PPO | Admitting: Family Medicine

## 2015-06-19 NOTE — Telephone Encounter (Signed)
Refill appropriate and filled per protocol. 

## 2015-06-26 ENCOUNTER — Encounter: Payer: Self-pay | Admitting: Family Medicine

## 2015-06-26 ENCOUNTER — Ambulatory Visit (INDEPENDENT_AMBULATORY_CARE_PROVIDER_SITE_OTHER): Payer: PPO | Admitting: Family Medicine

## 2015-06-26 VITALS — BP 110/64 | HR 80 | Temp 98.3°F | Resp 20 | Ht 63.0 in | Wt 249.0 lb

## 2015-06-26 DIAGNOSIS — G40909 Epilepsy, unspecified, not intractable, without status epilepticus: Secondary | ICD-10-CM

## 2015-06-26 DIAGNOSIS — Z09 Encounter for follow-up examination after completed treatment for conditions other than malignant neoplasm: Secondary | ICD-10-CM | POA: Diagnosis not present

## 2015-06-26 DIAGNOSIS — E1122 Type 2 diabetes mellitus with diabetic chronic kidney disease: Secondary | ICD-10-CM | POA: Diagnosis not present

## 2015-06-26 DIAGNOSIS — N186 End stage renal disease: Secondary | ICD-10-CM | POA: Diagnosis not present

## 2015-06-26 NOTE — Progress Notes (Signed)
Subjective:    Patient ID: Emily Hughes, female    DOB: May 22, 1941, 74 y.o.   MRN: [id number]  HPI Patient was recently admitted to the hospital with a seizure and DKA.  I have copied relevant portions of the discharge summary and included them below for my reference: Admit date: 06/12/2015 Discharge date: 06/14/2015  Recommendations for Outpatient Follow-up:  1. Pt will need to follow up with PCP in 2 weeks post discharge 2. Please obtain BMP in one week  Discharge Diagnoses:  New-onset seizure -No further seizures since admission  -Appreciate neurology consultation  -MRI brain negative for acute findings  -CT brain negative for acute findings  - EEG without any upper lip the form discharges - Continue Vimpat after d/c - I have informed the patient that she will not be able to drive for at least the next 6 months - Urine drug screen negative DKA and type 2 diabetes mellitus -Transitioned to subcutaneous insulin  -06/12/2015 hemoglobin A1c 11.4  - Patient's Lantus dose was adjusted based upon her CBGs -The patient will go home with Lantus 38 units daily CKD stage IV  -Baseline creatinine 2.5-2.8  -Continue calcitriol  -Continue bicarbonate  -We'll not restart losartan in the setting CKD stage IV  Hypertension  -Continue diltiazem, metoprolol succinate  -Hesitate to restart ARB in the setting CKD stage IV Hyperlipidemia -Continue pravastatin Coronary artery disease -No anginal symptoms presently -EKG without concerning ischemic changes -Continue aspirin   Since discharge from the hospital she has essentially resumed her previous insulin regimen. She is currently using 38 units of Lantus in the morning and 10 units of NovoLog with meals. She states that she is using a sliding scale correction but after further questioning she is not correcting her sugars at all depending on what the sugar is running. She is basically giving herself 10-11 units with every  meal matter what. She also reports fatigue since starting vipmat and hypersomnolence, but denies any seizure activity. Past Medical History  Diagnosis Date  . High cholesterol   . Hypertension   . Anemia   . CAD (coronary artery disease)   . Pancreatitis   . Colon polyps   . Proteinuria   . DDD (degenerative disc disease)   . CRI (chronic renal insufficiency)   . GERD (gastroesophageal reflux disease)   . Diabetes mellitus     INSULIN DEPENDENT   Past Surgical History  Procedure Laterality Date  . Back surgery    . Ankle surgery    . Tonsillectomy    . Angioplasty     Current Outpatient Prescriptions on File Prior to Visit  Medication Sig Dispense Refill  . ACCU-CHEK SOFTCLIX LANCETS lancets Use as instructed 100 each 12  . allopurinol (ZYLOPRIM) 300 MG tablet TAKE 1 TABLET ONCE DAILY. 90 tablet 2  . aspirin 81 MG tablet Take 81 mg by mouth daily.      . Blood Glucose Monitoring Suppl (ACCU-CHEK AVIVA PLUS) W/DEVICE KIT Dx - 250.00 1 kit 0  . calcitRIOL (ROCALTROL) 0.25 MCG capsule Take 0.25 mcg by mouth daily.    . cetirizine (ZYRTEC) 10 MG tablet Take 1 tablet (10 mg total) by mouth daily. 30 tablet 11  . cloNIDine (CATAPRES) 0.1 MG tablet Take 1 tablet (0.1 mg total) by mouth 2 (two) times daily. 180 tablet 3  . diltiazem (CARDIZEM CD) 300 MG 24 hr capsule Take 1 capsule (300 mg total) by mouth daily. 90 capsule 3  . esomeprazole (NEXIUM) 40 MG capsule  TAKE 1 CAPSULE TWICE DAILY BEFORE MEALS 60 capsule 3  . ferrous fumarate (FERRO-SEQUELS) 50 MG CR tablet Take 50 mg by mouth daily.      . furosemide (LASIX) 40 MG tablet TAKE 2 TABLETS IN THE MORNING AND 1 TABLET IN THE EVENING. 270 tablet 3  . glimepiride (AMARYL) 4 MG tablet TAKE 1 TABLET ONCE DAILY. 30 tablet 3  . glucose blood (ACCU-CHEK AVIVA PLUS) test strip Checks BS 4-5 times per day, QAM, Q AC Meals, QHS - DX-250.00 100 each 12  . insulin aspart (NOVOLOG) 100 UNIT/ML FlexPen Inject 10 Units into the skin daily with  lunch. 15 mL 3  . Insulin Glargine (LANTUS) 100 UNIT/ML Solostar Pen Inject 38 Units into the skin daily. 15 pen 3  . Insulin Pen Needle 31G X 5 MM MISC As directed for insulin use 300 each 3  . lacosamide 100 MG TABS Take 1 tablet (100 mg total) by mouth 2 (two) times daily. 60 tablet 1  . levothyroxine (SYNTHROID, LEVOTHROID) 50 MCG tablet Take 1 tablet (50 mcg total) by mouth daily. 90 tablet 3  . metoprolol succinate (TOPROL-XL) 25 MG 24 hr tablet TAKE (1) TABLET DAILY. 90 tablet 3  . mometasone (ELOCON) 0.1 % ointment APPLY TOPICALLY DAILY. 45 g 3  . pravastatin (PRAVACHOL) 80 MG tablet TAKE ONE TABLET AT BEDTIME. 90 tablet 1  . sodium bicarbonate 650 MG tablet Take 1 tablet (650 mg total) by mouth 4 (four) times daily. 360 tablet 1  . sucralfate (CARAFATE) 1 G tablet Take 1 tablet (1 g total) by mouth 4 (four) times daily -  with meals and at bedtime. 120 tablet 0  . UNABLE TO FIND Takes a potassium suspension daily     No current facility-administered medications on file prior to visit.   Allergies  Allergen Reactions  . Ace Inhibitors   . Actos [Pioglitazone Hydrochloride]   . Dust Mite Extract   . Dye Fdc Red [Erythrosine Red No. 3]    Social History   Social History  . Marital Status: Widowed    Spouse Name: N/A  . Number of Children: N/A  . Years of Education: N/A   Occupational History  . Not on file.   Social History Main Topics  . Smoking status: Former Research scientist (life sciences)  . Smokeless tobacco: Never Used     Comment: quit smoking in 2000  . Alcohol Use: No  . Drug Use: No  . Sexual Activity: Not on file   Other Topics Concern  . Not on file   Social History Narrative    Review of Systems  All other systems reviewed and are negative.      Objective:   Physical Exam  Constitutional: She appears well-developed and well-nourished. No distress.  Cardiovascular: Normal rate, regular rhythm and normal heart sounds.   Pulmonary/Chest: Effort normal and breath sounds  normal. No respiratory distress. She has no wheezes. She has no rales.  Abdominal: Soft. Bowel sounds are normal.  Skin: She is not diaphoretic.  Vitals reviewed.         Assessment & Plan:  Type II diabetes mellitus with end-stage renal disease ([place])  Seizure disorder [facility])  Hospital discharge follow-up  We had a long discussion today about how to manage her blood sugars. I would like her to continue Lantus 38 units subcutaneous in the morning. She states that her fasting blood sugars are typically less than 130. Therefore it seems that it is her postprandial sugars that are  causing her the issues. Therefore I have recommended that she use 10 units of NovoLog with meals plus a sliding scale correction factor based upon her sugars before meals. She will give herself an additional 2 units of Lantus for every 50 points above 150. <150-10 units 151-200-12 units 201-250-14 units 251-300-16 units 301-350-18 units 351+-20 units  Recheck sugars in  One week. I want her to write down what her sugars are before every meal and how many units of insulin she gave with that meal so that we can either titrate her baseline Lantus or titrate her baseline NovoLog depending upon the values.

## 2015-07-03 ENCOUNTER — Encounter: Payer: Self-pay | Admitting: Family Medicine

## 2015-07-03 ENCOUNTER — Ambulatory Visit (INDEPENDENT_AMBULATORY_CARE_PROVIDER_SITE_OTHER): Payer: PPO | Admitting: Family Medicine

## 2015-07-03 VITALS — BP 110/68 | HR 68 | Temp 98.9°F | Resp 18 | Ht 63.0 in | Wt 246.0 lb

## 2015-07-03 DIAGNOSIS — M79642 Pain in left hand: Secondary | ICD-10-CM

## 2015-07-03 DIAGNOSIS — N186 End stage renal disease: Secondary | ICD-10-CM

## 2015-07-03 DIAGNOSIS — E1122 Type 2 diabetes mellitus with diabetic chronic kidney disease: Secondary | ICD-10-CM

## 2015-07-03 MED ORDER — ACCU-CHEK SOFTCLIX LANCETS MISC: Status: DC

## 2015-07-03 MED ORDER — GLUCOSE BLOOD VI STRP: ORAL_STRIP | Status: DC

## 2015-07-03 MED ORDER — ACCU-CHEK AVIVA PLUS W/DEVICE KIT: PACK | Status: DC

## 2015-07-03 NOTE — Addendum Note (Signed)
Addended by: Shary Decamp B on: 07/03/2015 03:54 PM   Modules accepted: Orders

## 2015-07-03 NOTE — Progress Notes (Signed)
Subjective:    Patient ID: Emily Hughes, female    DOB: [birth year], 74 y.o.   MRN: [id number]  HPI 06/26/15 Patient was recently admitted to the hospital with a seizure and DKA.  I have copied relevant portions of the discharge summary and included them below for my reference: Admit date: 06/12/2015 Discharge date: 06/14/2015  Recommendations for Outpatient Follow-up:  1. Pt will need to follow up with PCP in 2 weeks post discharge 2. Please obtain BMP in one week  Discharge Diagnoses:  New-onset seizure -No further seizures since admission  -Appreciate neurology consultation  -MRI brain negative for acute findings  -CT brain negative for acute findings  - EEG without any upper lip the form discharges - Continue Vimpat after d/c - I have informed the patient that she will not be able to drive for at least the next 6 months - Urine drug screen negative DKA and type 2 diabetes mellitus -Transitioned to subcutaneous insulin  -06/12/2015 hemoglobin A1c 11.4  - Patient's Lantus dose was adjusted based upon her CBGs -The patient will go home with Lantus 38 units daily CKD stage IV  -Baseline creatinine 2.5-2.8  -Continue calcitriol  -Continue bicarbonate  -We'll not restart losartan in the setting CKD stage IV  Hypertension  -Continue diltiazem, metoprolol succinate  -Hesitate to restart ARB in the setting CKD stage IV Hyperlipidemia -Continue pravastatin Coronary artery disease -No anginal symptoms presently -EKG without concerning ischemic changes -Continue aspirin   Since discharge from the hospital she has essentially resumed her previous insulin regimen. She is currently using 38 units of Lantus in the morning and 10 units of NovoLog with meals. She states that she is using a sliding scale correction but after further questioning she is not correcting her sugars at all depending on what the sugar is running. She is basically giving herself 10-11 units  with every meal matter what. She also reports fatigue since starting vipmat and hypersomnolence, but denies any seizure activity.  At that time,my plan was: We had a long discussion today about how to manage her blood sugars. I would like her to continue Lantus 38 units subcutaneous in the morning. She states that her fasting blood sugars are typically less than 130. Therefore it seems that it is her postprandial sugars that are causing her the issues. Therefore I have recommended that she use 10 units of NovoLog with meals plus a sliding scale correction factor based upon her sugars before meals. She will give herself an additional 2 units of Lantus for every 50 points above 150. <150-10 units 151-200-12 units 201-250-14 units 251-300-16 units 301-350-18 units 351+-20 units  Recheck sugars in  One week. I want her to write down what her sugars are before every meal and how many units of insulin she gave with that meal so that we can either titrate her baseline Lantus or titrate her baseline NovoLog depending upon the values.  07/03/15 Her sugars have become much better. Fasting blood sugars are typically between 101 120. She denies any hypoglycemia. Two-hour postprandial sugars are typically under 180. Patient states that she is very seldomly having to use the sliding scale. Typically 10 units of insulin per meal is all she is requiring her blood sugars are typically running around 150. Unfortunately she has no blood sugars for me to review today. Past Medical History  Diagnosis Date  . High cholesterol   . Hypertension   . Anemia   . CAD (coronary artery disease)   .  Pancreatitis   . Colon polyps   . Proteinuria   . DDD (degenerative disc disease)   . CRI (chronic renal insufficiency)   . GERD (gastroesophageal reflux disease)   . Diabetes mellitus     INSULIN DEPENDENT   Past Surgical History  Procedure Laterality Date  . Back surgery    . Ankle surgery    . Tonsillectomy    .  Angioplasty     Current Outpatient Prescriptions on File Prior to Visit  Medication Sig Dispense Refill  . ACCU-CHEK SOFTCLIX LANCETS lancets Use as instructed 100 each 12  . allopurinol (ZYLOPRIM) 300 MG tablet TAKE 1 TABLET ONCE DAILY. 90 tablet 2  . aspirin 81 MG tablet Take 81 mg by mouth daily.      . Blood Glucose Monitoring Suppl (ACCU-CHEK AVIVA PLUS) W/DEVICE KIT Dx - 250.00 1 kit 0  . calcitRIOL (ROCALTROL) 0.25 MCG capsule Take 0.25 mcg by mouth daily.    . cetirizine (ZYRTEC) 10 MG tablet Take 1 tablet (10 mg total) by mouth daily. 30 tablet 11  . cloNIDine (CATAPRES) 0.1 MG tablet Take 1 tablet (0.1 mg total) by mouth 2 (two) times daily. 180 tablet 3  . diltiazem (CARDIZEM CD) 300 MG 24 hr capsule Take 1 capsule (300 mg total) by mouth daily. 90 capsule 3  . esomeprazole (NEXIUM) 40 MG capsule TAKE 1 CAPSULE TWICE DAILY BEFORE MEALS 60 capsule 3  . ferrous fumarate (FERRO-SEQUELS) 50 MG CR tablet Take 50 mg by mouth daily.      . furosemide (LASIX) 40 MG tablet TAKE 2 TABLETS IN THE MORNING AND 1 TABLET IN THE EVENING. 270 tablet 3  . glimepiride (AMARYL) 4 MG tablet TAKE 1 TABLET ONCE DAILY. 30 tablet 3  . glucose blood (ACCU-CHEK AVIVA PLUS) test strip Checks BS 4-5 times per day, QAM, Q AC Meals, QHS - DX-250.00 100 each 12  . insulin aspart (NOVOLOG) 100 UNIT/ML FlexPen Inject 10 Units into the skin daily with lunch. 15 mL 3  . Insulin Glargine (LANTUS) 100 UNIT/ML Solostar Pen Inject 38 Units into the skin daily. 15 pen 3  . Insulin Pen Needle 31G X 5 MM MISC As directed for insulin use 300 each 3  . lacosamide 100 MG TABS Take 1 tablet (100 mg total) by mouth 2 (two) times daily. 60 tablet 1  . levothyroxine (SYNTHROID, LEVOTHROID) 50 MCG tablet Take 1 tablet (50 mcg total) by mouth daily. 90 tablet 3  . metoprolol succinate (TOPROL-XL) 25 MG 24 hr tablet TAKE (1) TABLET DAILY. 90 tablet 3  . mometasone (ELOCON) 0.1 % ointment APPLY TOPICALLY DAILY. 45 g 3  . pravastatin  (PRAVACHOL) 80 MG tablet TAKE ONE TABLET AT BEDTIME. 90 tablet 1  . sodium bicarbonate 650 MG tablet Take 1 tablet (650 mg total) by mouth 4 (four) times daily. 360 tablet 1  . sucralfate (CARAFATE) 1 G tablet Take 1 tablet (1 g total) by mouth 4 (four) times daily -  with meals and at bedtime. 120 tablet 0  . UNABLE TO FIND Takes a potassium suspension daily     No current facility-administered medications on file prior to visit.   Allergies  Allergen Reactions  . Ace Inhibitors   . Actos [Pioglitazone Hydrochloride]   . Dust Mite Extract   . Dye Fdc Red [Erythrosine Red No. 3]    Social History   Social History  . Marital Status: Widowed    Spouse Name: N/A  . Number of Children:  N/A  . Years of Education: N/A   Occupational History  . Not on file.   Social History Main Topics  . Smoking status: Former Research scientist (life sciences)  . Smokeless tobacco: Never Used     Comment: quit smoking in 2000  . Alcohol Use: No  . Drug Use: No  . Sexual Activity: Not on file   Other Topics Concern  . Not on file   Social History Narrative    Review of Systems  All other systems reviewed and are negative.      Objective:   Physical Exam  Constitutional: She appears well-developed and well-nourished. No distress.  Cardiovascular: Normal rate, regular rhythm and normal heart sounds.   Pulmonary/Chest: Effort normal and breath sounds normal. No respiratory distress. She has no wheezes. She has no rales.  Abdominal: Soft. Bowel sounds are normal.  Skin: She is not diaphoretic.  Vitals reviewed.         Assessment & Plan:  Hand pain, left - Plan: DG Hand Complete Left  Type II diabetes mellitus with end-stage renal disease ([place])  Based on her report, her sugars are doing excellent. I'll make no changes in her diabetic regimen. I have asked her to bring in her blood sugars so that I can review them to verify that they truly are as good as she is reporting. She also complains of pain in the  dorsum of her left hand and along the left second digit. This was the hand that was seizing and instructed  Numerous times. She is tender to palpation all along the left second metacarpal and left digit. I will proceed with an x-ray to rule out a traumatic injury during the seizure

## 2015-07-04 ENCOUNTER — Telehealth: Payer: Self-pay | Admitting: Family Medicine

## 2015-07-04 DIAGNOSIS — R2681 Unsteadiness on feet: Secondary | ICD-10-CM

## 2015-07-04 MED ORDER — COLCHICINE 0.6 MG PO TABS: ORAL_TABLET | ORAL | Status: DC

## 2015-07-04 NOTE — Telephone Encounter (Signed)
ok 

## 2015-07-04 NOTE — Telephone Encounter (Signed)
Colchicine 0.6 mg 2 tabs pox 1 and then 0.6 mg again in 1 hour.  Ok with bedside commode

## 2015-07-04 NOTE — Telephone Encounter (Signed)
OK to do-

## 2015-07-04 NOTE — Telephone Encounter (Signed)
Pt aware and has gotten a bedside commode from relative.

## 2015-07-04 NOTE — Telephone Encounter (Signed)
Emily Hughes called to request that a referral be written for her to have physical therapy because she is having a hard time getting up & walking.  You can reach her @ [phone]

## 2015-07-04 NOTE — Telephone Encounter (Signed)
Stanton Kidney calling again!  Wants [place] to send order for bedside commode.

## 2015-07-04 NOTE — Telephone Encounter (Signed)
Sister Stanton Kidney calling back again.  Emily Hughes is having a gout flare up in foot.  Red and swollen.  Making it diff for her to walk and get to [place].  Wanted [place] to order her a brace for foot.  Told her that was not the answer.  Putting brace on gouty extremity would only be more painful.  Is there anything else you can recommend and can she have something for pain?

## 2015-07-10 ENCOUNTER — Telehealth: Payer: Self-pay | Admitting: Family Medicine

## 2015-07-10 NOTE — Telephone Encounter (Signed)
Patient still has not heard from outpatient rehab.  Sister is calling because patient getting weaker at home.  At high risk of falling.  Sister feels they will have hard time getting her out of house.  Going to change PT order to [facility].  ALSO, sister reports patient is not eating.  Taking all her medications but is NOT eating which could adding to her weakness.  Wants to know if you can order her something to stimulate her appetite.  Please advise?

## 2015-07-10 NOTE — Telephone Encounter (Signed)
Basilia Jumbo, sister calling to discuss this patient with you regarding her rehabilitation  [phone]

## 2015-07-10 NOTE — Telephone Encounter (Signed)
I want to see her back.  Emily Hughes told me her memory is failing, sounds more serious and NTBS.

## 2015-07-11 NOTE — Telephone Encounter (Signed)
Aware that NTBS, appt for next week has been made

## 2015-07-15 DIAGNOSIS — N189 Chronic kidney disease, unspecified: Secondary | ICD-10-CM | POA: Diagnosis not present

## 2015-07-15 DIAGNOSIS — I129 Hypertensive chronic kidney disease with stage 1 through stage 4 chronic kidney disease, or unspecified chronic kidney disease: Secondary | ICD-10-CM | POA: Diagnosis not present

## 2015-07-15 DIAGNOSIS — N184 Chronic kidney disease, stage 4 (severe): Secondary | ICD-10-CM | POA: Diagnosis not present

## 2015-07-15 DIAGNOSIS — M109 Gout, unspecified: Secondary | ICD-10-CM | POA: Diagnosis not present

## 2015-07-15 DIAGNOSIS — E1129 Type 2 diabetes mellitus with other diabetic kidney complication: Secondary | ICD-10-CM | POA: Diagnosis not present

## 2015-07-15 DIAGNOSIS — R809 Proteinuria, unspecified: Secondary | ICD-10-CM | POA: Diagnosis not present

## 2015-07-15 DIAGNOSIS — D631 Anemia in chronic kidney disease: Secondary | ICD-10-CM | POA: Diagnosis not present

## 2015-07-15 DIAGNOSIS — N2581 Secondary hyperparathyroidism of renal origin: Secondary | ICD-10-CM | POA: Diagnosis not present

## 2015-07-17 ENCOUNTER — Ambulatory Visit (INDEPENDENT_AMBULATORY_CARE_PROVIDER_SITE_OTHER): Payer: PPO | Admitting: Family Medicine

## 2015-07-17 ENCOUNTER — Encounter: Payer: Self-pay | Admitting: Family Medicine

## 2015-07-17 VITALS — BP 108/66 | HR 80 | Temp 98.4°F | Resp 18 | Ht 63.0 in | Wt 255.0 lb

## 2015-07-17 DIAGNOSIS — R413 Other amnesia: Secondary | ICD-10-CM | POA: Diagnosis not present

## 2015-07-17 DIAGNOSIS — W19XXXA Unspecified fall, initial encounter: Secondary | ICD-10-CM | POA: Diagnosis not present

## 2015-07-17 NOTE — Progress Notes (Signed)
Subjective:    Patient ID: Emily Hughes, female    DOB: 11/06/1940, 75 y.o.   MRN: [id number]  HPI 06/26/15 Patient was recently admitted to the hospital with a seizure and DKA.  I have copied relevant portions of the discharge summary and included them below for my reference: Admit date: 06/12/2015 Discharge date: 06/14/2015  Recommendations for Outpatient Follow-up:  1. Pt will need to follow up with PCP in 2 weeks post discharge 2. Please obtain BMP in one week  Discharge Diagnoses:  New-onset seizure -No further seizures since admission  -Appreciate neurology consultation  -MRI brain negative for acute findings  -CT brain negative for acute findings  - EEG without any upper lip the form discharges - Continue Vimpat after d/c - I have informed the patient that she will not be able to drive for at least the next 6 months - Urine drug screen negative DKA and type 2 diabetes mellitus -Transitioned to subcutaneous insulin  -06/12/2015 hemoglobin A1c 11.4  - Patient's Lantus dose was adjusted based upon her CBGs -The patient will go home with Lantus 38 units daily CKD stage IV  -Baseline creatinine 2.5-2.8  -Continue calcitriol  -Continue bicarbonate  -We'll not restart losartan in the setting CKD stage IV  Hypertension  -Continue diltiazem, metoprolol succinate  -Hesitate to restart ARB in the setting CKD stage IV Hyperlipidemia -Continue pravastatin Coronary artery disease -No anginal symptoms presently -EKG without concerning ischemic changes -Continue aspirin   Since discharge from the hospital she has essentially resumed her previous insulin regimen. She is currently using 38 units of Lantus in the morning and 10 units of NovoLog with meals. She states that she is using a sliding scale correction but after further questioning she is not correcting her sugars at all depending on what the sugar is running. She is basically giving herself 10-11 units  with every meal matter what. She also reports fatigue since starting vipmat and hypersomnolence, but denies any seizure activity.  At that time,my plan was: We had a long discussion today about how to manage her blood sugars. I would like her to continue Lantus 38 units subcutaneous in the morning. She states that her fasting blood sugars are typically less than 130. Therefore it seems that it is her postprandial sugars that are causing her the issues. Therefore I have recommended that she use 10 units of NovoLog with meals plus a sliding scale correction factor based upon her sugars before meals. She will give herself an additional 2 units of Lantus for every 50 points above 150. <150-10 units 151-200-12 units 201-250-14 units 251-300-16 units 301-350-18 units 351+-20 units  Recheck sugars in  One week. I want her to write down what her sugars are before every meal and how many units of insulin she gave with that meal so that we can either titrate her baseline Lantus or titrate her baseline NovoLog depending upon the values.  07/03/15 Her sugars have become much better. Fasting blood sugars are typically between 101 120. She denies any hypoglycemia. Two-hour postprandial sugars are typically under 180. Patient states that she is very seldomly having to use the sliding scale. Typically 10 units of insulin per meal is all she is requiring her blood sugars are typically running around 150. Unfortunately she has no blood sugars for me to review today.  At that time, my plan was: Based on her report, her sugars are doing excellent. I'll make no changes in her diabetic regimen. I have asked her  to bring in her blood sugars so that I can review them to verify that they truly are as good as she is reporting. She also complains of pain in the dorsum of her left hand and along the left second digit. This was the hand that was seizing and instructed  Numerous times. She is tender to palpation all along the left  second metacarpal and left digit. I will proceed with an x-ray to rule out a traumatic injury during the seizure  07/17/15 Family members have called me and told me that the patient is falling and staggering into walls. She is also confused and demonstrating memory loss. She is also experiencing urinary incontinence. The symptoms were not present prior to her hospitalization raising the concern about possible medication side effects or possible delirium from a previously hidden underlying dementia.   She is here today for follow-up.   Past Medical History  Diagnosis Date  . High cholesterol   . Hypertension   . Anemia   . CAD (coronary artery disease)   . Pancreatitis   . Colon polyps   . Proteinuria   . DDD (degenerative disc disease)   . CRI (chronic renal insufficiency)   . GERD (gastroesophageal reflux disease)   . Diabetes mellitus     INSULIN DEPENDENT   Past Surgical History  Procedure Laterality Date  . Back surgery    . Ankle surgery    . Tonsillectomy    . Angioplasty     Current Outpatient Prescriptions on File Prior to Visit  Medication Sig Dispense Refill  . ACCU-CHEK SOFTCLIX LANCETS lancets Use as instructed 100 each 12  . allopurinol (ZYLOPRIM) 300 MG tablet TAKE 1 TABLET ONCE DAILY. 90 tablet 2  . aspirin 81 MG tablet Take 81 mg by mouth daily.      . Blood Glucose Monitoring Suppl (ACCU-CHEK AVIVA PLUS) w/Device KIT Dx - e11.9 1 kit 0  . calcitRIOL (ROCALTROL) 0.25 MCG capsule Take 0.25 mcg by mouth daily.    . cetirizine (ZYRTEC) 10 MG tablet Take 1 tablet (10 mg total) by mouth daily. 30 tablet 11  . cloNIDine (CATAPRES) 0.1 MG tablet Take 1 tablet (0.1 mg total) by mouth 2 (two) times daily. 180 tablet 3  . colchicine 0.6 MG tablet Take two tablets by mouth now then repeat one tablet in 1 hour 3 tablet 0  . diltiazem (CARDIZEM CD) 300 MG 24 hr capsule Take 1 capsule (300 mg total) by mouth daily. 90 capsule 3  . esomeprazole (NEXIUM) 40 MG capsule TAKE 1  CAPSULE TWICE DAILY BEFORE MEALS 60 capsule 3  . ferrous fumarate (FERRO-SEQUELS) 50 MG CR tablet Take 50 mg by mouth daily.      . furosemide (LASIX) 40 MG tablet TAKE 2 TABLETS IN THE MORNING AND 1 TABLET IN THE EVENING. 270 tablet 3  . glimepiride (AMARYL) 4 MG tablet TAKE 1 TABLET ONCE DAILY. 30 tablet 3  . glucose blood (ACCU-CHEK AVIVA PLUS) test strip Checks BS 4-5 times per day, QAM, Q AC Meals, QHS - DX-e11.9 100 each 12  . insulin aspart (NOVOLOG) 100 UNIT/ML FlexPen Inject 10 Units into the skin daily with lunch. 15 mL 3  . Insulin Glargine (LANTUS) 100 UNIT/ML Solostar Pen Inject 38 Units into the skin daily. 15 pen 3  . Insulin Pen Needle 31G X 5 MM MISC As directed for insulin use 300 each 3  . lacosamide 100 MG TABS Take 1 tablet (100 mg total) by mouth 2 (  two) times daily. 60 tablet 1  . levothyroxine (SYNTHROID, LEVOTHROID) 50 MCG tablet Take 1 tablet (50 mcg total) by mouth daily. 90 tablet 3  . metoprolol succinate (TOPROL-XL) 25 MG 24 hr tablet TAKE (1) TABLET DAILY. 90 tablet 3  . mometasone (ELOCON) 0.1 % ointment APPLY TOPICALLY DAILY. 45 g 3  . pravastatin (PRAVACHOL) 80 MG tablet TAKE ONE TABLET AT BEDTIME. 90 tablet 1  . sodium bicarbonate 650 MG tablet Take 1 tablet (650 mg total) by mouth 4 (four) times daily. 360 tablet 1  . sucralfate (CARAFATE) 1 G tablet Take 1 tablet (1 g total) by mouth 4 (four) times daily -  with meals and at bedtime. 120 tablet 0  . UNABLE TO FIND Takes a potassium suspension daily     No current facility-administered medications on file prior to visit.   Allergies  Allergen Reactions  . Ace Inhibitors   . Actos [Pioglitazone Hydrochloride]   . Dust Mite Extract   . Dye Fdc Red [Erythrosine Red No. 3]    Social History   Social History  . Marital Status: Widowed    Spouse Name: N/A  . Number of Children: N/A  . Years of Education: N/A   Occupational History  . Not on file.   Social History Main Topics  . Smoking status:  Former Research scientist (life sciences)  . Smokeless tobacco: Never Used     Comment: quit smoking in 2000  . Alcohol Use: No  . Drug Use: No  . Sexual Activity: Not on file   Other Topics Concern  . Not on file   Social History Narrative    Review of Systems  All other systems reviewed and are negative.      Objective:   Physical Exam  Constitutional: She appears well-developed and well-nourished. No distress.  Cardiovascular: Normal rate, regular rhythm and normal heart sounds.   Pulmonary/Chest: Effort normal and breath sounds normal. No respiratory distress. She has no wheezes. She has no rales.  Abdominal: Soft. Bowel sounds are normal.  Skin: She is not diaphoretic.  Vitals reviewed. 25/30  on Mini-Mental status exam today, the patient scores 4 out of 5 on date , 5 out of 5 on location , 2 out of 3 on rapid recall , 2 out of 5 on serial sevens. On mini cog , she draws a round face of a clock. She writes all 12 numbers but she  Repeats #12. Also the spacing is inappropriate with 9 crammed into the 7 position. She is unable to draw the hands on the clock at the correct time or even in the correct spacing       Assessment & Plan:  Memory loss - Plan: Valproic acid level  Falls, initial encounter - Plan: Valproic acid level   it is possible that the patient may be having medication side effects. After further review she is actually not on lacosamide.    Her medicine list is incorrect. She was actually switched to Depakote 500 mg by mouth twice a day. Therefore I will check a Depakote level. I will decrease Depakote to 500 mg at night. The symptoms were not present prior to her hospitalization. However it is also possible that she has had mild dementia that has been exacerbated by medicine changes. I see no evidence on her exam today of delirium. Hopefully decreasing the Depakote or possibly stopping it would improve this. I will await the Depakote level.

## 2015-07-18 LAB — VALPROIC ACID LEVEL: Valproic Acid Lvl: 49.2 ug/mL — ABNORMAL LOW (ref 50.0–100.0)

## 2015-07-22 ENCOUNTER — Telehealth: Payer: Self-pay | Admitting: Family Medicine

## 2015-07-22 NOTE — Telephone Encounter (Signed)
Med list is wrong, see my last note

## 2015-07-22 NOTE — Telephone Encounter (Signed)
Med list was updated to reflect current order.

## 2015-07-22 NOTE — Telephone Encounter (Signed)
Spoke with patient's sister, Stanton Kidney.  Told to decrease Depakote to 500 mg at bedtime to see if symptoms improve.  Recheck in 1 week.

## 2015-07-22 NOTE — Telephone Encounter (Signed)
-----   Message from Susy Frizzle, MD sent at 07/18/2015  7:54 AM EST ----- Level is not toxic but I would still like her to reduce dose to one at night to see if symptoms improve.  Recheck in 1 week.

## 2015-07-24 ENCOUNTER — Encounter: Payer: Self-pay | Admitting: Neurology

## 2015-07-24 ENCOUNTER — Ambulatory Visit (INDEPENDENT_AMBULATORY_CARE_PROVIDER_SITE_OTHER): Payer: PPO | Admitting: Neurology

## 2015-07-24 VITALS — BP 136/74 | HR 92 | Resp 18 | Wt 247.0 lb

## 2015-07-24 DIAGNOSIS — E1122 Type 2 diabetes mellitus with diabetic chronic kidney disease: Secondary | ICD-10-CM

## 2015-07-24 DIAGNOSIS — E1110 Type 2 diabetes mellitus with ketoacidosis without coma: Secondary | ICD-10-CM | POA: Insufficient documentation

## 2015-07-24 DIAGNOSIS — R569 Unspecified convulsions: Secondary | ICD-10-CM

## 2015-07-24 MED ORDER — DIVALPROEX SODIUM ER 500 MG PO TB24: ORAL_TABLET | ORAL | Status: DC

## 2015-07-24 NOTE — Patient Instructions (Signed)
1. Continue Depakote 500mg  at bedtime 2. Schedule 24-hour EEG 3. Continue good control of your sugar levels 4. As per [place] driving laws, no driving after a seizure until 6 months seizure-free 5. Follow-up in 1 month, call for any changes  Seizure Precautions: 1. If medication has been prescribed for you to prevent seizures, take it exactly as directed.  Do not stop taking the medicine without talking to your doctor first, even if you have not had a seizure in a long time.   2. Avoid activities in which a seizure would cause danger to yourself or to others.  Don't operate dangerous machinery, swim alone, or climb in high or dangerous places, such as on ladders, roofs, or girders.  Do not drive unless your doctor says you may.  3. If you have any warning that you may have a seizure, lay down in a safe place where you can't hurt yourself.    4.  No driving for 6 months from last seizure, as per [facility].   Please refer to the following link on the [employer] website for more information: http://[email or web address].org/[email or web address]/driving/drivingu.cfm   5.  Maintain good sleep hygiene. Avoid alcohol.  6.  Contact your doctor if you have any problems that may be related to the medicine you are taking.  7.  Call 911 and bring the patient back to the ED if:        A.  The seizure lasts longer than 5 minutes.       B.  The patient doesn't awaken shortly after the seizure  C.  The patient has new problems such as difficulty seeing, speaking or moving  D.  The patient was injured during the seizure  E.  The patient has a temperature over 102 F (39C)  F.  The patient vomited and now is having trouble breathing

## 2015-07-24 NOTE — Progress Notes (Signed)
NEUROLOGY CONSULTATION NOTE  Emily Hughes MRN: [id number] DOB: Dec 02, 1940  Referring provider: Dr. Shanon Brow Tat Primary care provider: Dr. Jenna Luo  Reason for consult:  seizures  Dear Dr Tat:  Thank you for your kind referral of Emily Hughes for consultation of the above symptoms. Although her history is well known to you, please allow me to reiterate it for the purpose of our medical record. Records and images were personally reviewed where available.  HISTORY OF PRESENT ILLNESS: This is a pleasant 75 year old right-handed woman with a history of diabetes, hypertension, CKD, CAD, presenting after hospital admission last 06/12/15 for new onset seizure. She was in the shower when she suddenly felt dizzy like she would pass out, then her left arm extended up and started jerking uncontrollably. She fell to the floor and lay in the shower, denying any loss of consciousness. She thought her left leg would not move, but it did, and was able to get out of the shower. Her sister called and she told her she was not feeling good. Family brought her to the ER where she was witnessed to have left arm stiffening and jerking followed by a generalized tonic-clonic seizure. Glucose on arrival was [id number] with metabolic acidosis, and received treatment for DKA. Her CBC showed a WBC of 14.4, UDS negative. She reports that she had not yet eaten that morning and had not taken her insulin yet. She denied any focal weakness after the seizures. I personally reviewed MRI brain without contrast which did not show any acute changes, there was mild diffuse volume loss. Her routine wake and sleep EEG was normal. She was started on Vimpat 100mg  BID, however once she was discharged, found that her insurance would not pay for Vimpat. She was started on Depakote 500mg  BID.   She denies any further seizures since hospital discharge. She denies any prior history of seizures, no headaches, further dizziness, diplopia,  dysarthria, dysphagia, neck/back pain, focal numbness/tingling/weakness, bowel/bladder dysfunction. She denies any olfactory/gustatory hallucinations, deja vu, rising epigastric sensation. She used to have migraines but only has them infrequently now. She however does not like how Depakote makes her feel. She feels "crazy" on it, with a weird feeling of confusion, staggering when walking, poor balance. She has noticed she occasionally can't get her words out, "like my mind is trying to catch up." This is a new symptom for her. Dose was reduced by her PCP to 500mg  qhs 2 days ago. Her Depakote level on 500mg  BID was 49.2. She reports the staggering is better, but she still feels "weird" on the lower dose.  Epilepsy Risk Factors:  She had a normal birth and early development.  There is no history of febrile convulsions, CNS infections such as meningitis/encephalitis, significant traumatic brain injury, neurosurgical procedures, or family history of seizures.  PAST MEDICAL HISTORY: Past Medical History  Diagnosis Date  . High cholesterol   . Hypertension   . Anemia   . CAD (coronary artery disease)   . Pancreatitis   . Colon polyps   . Proteinuria   . DDD (degenerative disc disease)   . CRI (chronic renal insufficiency)   . GERD (gastroesophageal reflux disease)   . Diabetes mellitus     INSULIN DEPENDENT    PAST SURGICAL HISTORY: Past Surgical History  Procedure Laterality Date  . Back surgery    . Ankle surgery    . Tonsillectomy    . Angioplasty      MEDICATIONS: Current Outpatient Prescriptions on  File Prior to Visit  Medication Sig Dispense Refill  . allopurinol (ZYLOPRIM) 300 MG tablet TAKE 1 TABLET ONCE DAILY. 90 tablet 2  . aspirin 81 MG tablet Take 81 mg by mouth daily.      . calcitRIOL (ROCALTROL) 0.25 MCG capsule Take 0.25 mcg by mouth daily.    . cetirizine (ZYRTEC) 10 MG tablet Take 1 tablet (10 mg total) by mouth daily. 30 tablet 11  . cloNIDine (CATAPRES) 0.1 MG  tablet Take 1 tablet (0.1 mg total) by mouth 2 (two) times daily. 180 tablet 3  . colchicine 0.6 MG tablet Take two tablets by mouth now then repeat one tablet in 1 hour 3 tablet 0  . diltiazem (CARDIZEM CD) 300 MG 24 hr capsule Take 1 capsule (300 mg total) by mouth daily. 90 capsule 3  . divalproex (DEPAKOTE) 500 MG DR tablet Take 500 mg by mouth at bedtime.     Marland Kitchen esomeprazole (NEXIUM) 40 MG capsule TAKE 1 CAPSULE TWICE DAILY BEFORE MEALS 60 capsule 3  . ferrous fumarate (FERRO-SEQUELS) 50 MG CR tablet Take 50 mg by mouth daily.      . furosemide (LASIX) 40 MG tablet TAKE 2 TABLETS IN THE MORNING AND 1 TABLET IN THE EVENING. 270 tablet 3  . glimepiride (AMARYL) 4 MG tablet TAKE 1 TABLET ONCE DAILY. 30 tablet 3  . insulin aspart (NOVOLOG) 100 UNIT/ML FlexPen Inject 10 Units into the skin daily with lunch. (Patient taking differently: Inject 10 Units into the skin. Sliding Scale) 15 mL 3  . Insulin Glargine (LANTUS) 100 UNIT/ML Solostar Pen Inject 38 Units into the skin daily. 15 pen 3  . levothyroxine (SYNTHROID, LEVOTHROID) 50 MCG tablet Take 1 tablet (50 mcg total) by mouth daily. 90 tablet 3  . metoprolol succinate (TOPROL-XL) 25 MG 24 hr tablet TAKE (1) TABLET DAILY. 90 tablet 3  . mometasone (ELOCON) 0.1 % ointment APPLY TOPICALLY DAILY. 45 g 3  . pravastatin (PRAVACHOL) 80 MG tablet TAKE ONE TABLET AT BEDTIME. 90 tablet 1  . sodium bicarbonate 650 MG tablet Take 1 tablet (650 mg total) by mouth 4 (four) times daily. 360 tablet 1  . sucralfate (CARAFATE) 1 G tablet Take 1 tablet (1 g total) by mouth 4 (four) times daily -  with meals and at bedtime. 120 tablet 0  . UNABLE TO FIND Takes a potassium suspension daily     No current facility-administered medications on file prior to visit.    ALLERGIES: Allergies  Allergen Reactions  . Ace Inhibitors   . Actos [Pioglitazone Hydrochloride]   . Dust Mite Extract   . Dye Fdc Red [Erythrosine Red No. 3]     FAMILY HISTORY: No family  history on file.  SOCIAL HISTORY: Social History   Social History  . Marital Status: Widowed    Spouse Name: N/A  . Number of Children: N/A  . Years of Education: N/A   Occupational History  . Not on file.   Social History Main Topics  . Smoking status: Former Research scientist (life sciences)  . Smokeless tobacco: Never Used     Comment: quit smoking in 2000  . Alcohol Use: No  . Drug Use: No  . Sexual Activity: Not on file   Other Topics Concern  . Not on file   Social History Narrative    REVIEW OF SYSTEMS: Constitutional: No fevers, chills, or sweats, no generalized fatigue, change in appetite Eyes: No visual changes, double vision, eye pain Ear, nose and throat: No hearing loss,  ear pain, nasal congestion, sore throat Cardiovascular: No chest pain, palpitations Respiratory:  No shortness of breath at rest or with exertion, wheezes GastrointestinaI: No nausea, vomiting, diarrhea, abdominal pain, fecal incontinence Genitourinary:  No dysuria, urinary retention or frequency Musculoskeletal:  No neck pain, back pain, +bilateral knee pain Integumentary: No rash, pruritus, skin lesions Neurological: as above Psychiatric: No depression, insomnia, anxiety Endocrine: No palpitations, fatigue, diaphoresis, mood swings, change in appetite, change in weight, increased thirst Hematologic/Lymphatic:  No anemia, purpura, petechiae. Allergic/Immunologic: no itchy/runny eyes, nasal congestion, recent allergic reactions, rashes  PHYSICAL EXAM: Filed Vitals:   07/24/15 0841  BP: 136/74  Pulse: 92  Resp: 18   General: No acute distress Head:  Normocephalic/atraumatic Eyes: Fundoscopic exam shows bilateral sharp discs, no vessel changes, exudates, or hemorrhages Neck: supple, no paraspinal tenderness, full range of motion Back: No paraspinal tenderness Heart: regular rate and rhythm Lungs: Clear to auscultation bilaterally. Vascular: No carotid bruits. Skin/Extremities: No rash, no  edema Neurological Exam: Mental status: alert and oriented to person, place, and time, no dysarthria or aphasia, Fund of knowledge is appropriate.  Recent and remote memory are intact.  Attention and concentration are normal.    Able to name objects and repeat phrases. CDT 4/5 MMSE - Mini Mental State Exam 07/24/2015  Orientation to time 5  Orientation to Place 5  Registration 3  Attention/ Calculation 5  Recall 1  Language- name 2 objects 2  Language- repeat 1  Language- follow 3 step command 2  Language- read & follow direction 1  Write a sentence 1  Copy design 1  Total score 27   Cranial nerves: CN I: not tested CN II: pupils equal, round and reactive to light, visual fields intact, fundi unremarkable. CN III, IV, VI:  full range of motion, no nystagmus, no ptosis CN V: facial sensation intact CN VII: upper and lower face symmetric CN VIII: hearing intact to finger rub CN IX, X: gag intact, uvula midline CN XI: sternocleidomastoid and trapezius muscles intact CN XII: tongue midline Bulk & Tone: normal, no fasciculations. Motor: 5/5 throughout with no pronator drift. Sensation: intact to light touch, cold, pin, vibration and joint position sense.  No extinction to double simultaneous stimulation.  Romberg test negative Deep Tendon Reflexes: +1 throughout, no ankle clonus Plantar responses: downgoing bilaterally Cerebellar: no incoordination on finger to nose, heel to shin. No dysdiadochokinesia Gait: narrow-based and steady, able to tandem walk adequately. Tremor: no resting tremor, +mild high frequency low amplitude postural and endpoint tremor bilaterally (patient reports she is nervous)  IMPRESSION: This is a pleasant 75 year old right-handed woman with a history of hypertension, diabetes, CKD, presenting for new onset seizures last 06/12/2015. She had 2 focal seizures that started with left arm jerking, one of which was witnessed to progress to a GTC. Her glucose level was  reported to be in the 460s on admission. Focal motor seizures can be seen with hyperglycemia. She has no other clear epilepsy risk factors, neurological exam today non-focal. MMSE 27/30. A 24-hour EEG will be ordered to further classify her seizures to help guide long-term management. Continue seizure medication for now. She does not like how Depakote makes her feel, but has only reduced it to once a day a few days ago. I discussed the option of switching to a different medication such as Lamotrigine, however she is hesitant and agrees to see how she feels first on lower dose. She will be switched to Depakote ER 500mg  qhs for  once a day dosing. We discussed continued optimal control of glucose. [place] driving laws were discussed with the patient, and she knows to stop driving after a seizure, until 6 months seizure-free. She will follow-up in 1 month and knows to call for any changes.   Thank you for allowing me to participate in the care of this patient. Please do not hesitate to call for any questions or concerns.   Ellouise Newer, M.D.  CC: Dr. Dennard Schaumann

## 2015-07-29 ENCOUNTER — Encounter: Payer: Self-pay | Admitting: Family Medicine

## 2015-07-29 ENCOUNTER — Ambulatory Visit (INDEPENDENT_AMBULATORY_CARE_PROVIDER_SITE_OTHER): Payer: PPO | Admitting: Neurology

## 2015-07-29 DIAGNOSIS — R569 Unspecified convulsions: Secondary | ICD-10-CM | POA: Diagnosis not present

## 2015-07-30 ENCOUNTER — Other Ambulatory Visit (HOSPITAL_COMMUNITY): Payer: Self-pay | Admitting: *Deleted

## 2015-07-31 ENCOUNTER — Encounter (HOSPITAL_COMMUNITY)
Admission: RE | Admit: 2015-07-31 | Discharge: 2015-07-31 | Disposition: A | Discharge status: Home / Self Care | Payer: PPO | Source: Ambulatory Visit | Attending: Nephrology | Admitting: Nephrology

## 2015-07-31 DIAGNOSIS — D509 Iron deficiency anemia, unspecified: Secondary | ICD-10-CM | POA: Insufficient documentation

## 2015-07-31 MED ORDER — SODIUM CHLORIDE 0.9 % IV SOLN
510.0000 mg | INTRAVENOUS | Status: DC
  Administered 2015-07-31: 510 mg via INTRAVENOUS
  Filled 2015-07-31: qty 17

## 2015-08-07 ENCOUNTER — Encounter (HOSPITAL_COMMUNITY)
Admission: RE | Admit: 2015-08-07 | Discharge: 2015-08-07 | Disposition: A | Discharge status: Home / Self Care | Payer: PPO | Source: Ambulatory Visit | Attending: Nephrology | Admitting: Nephrology

## 2015-08-07 DIAGNOSIS — D509 Iron deficiency anemia, unspecified: Secondary | ICD-10-CM | POA: Diagnosis not present

## 2015-08-07 MED ORDER — SODIUM CHLORIDE 0.9 % IV SOLN
510.0000 mg | INTRAVENOUS | Status: AC
  Administered 2015-08-07: 510 mg via INTRAVENOUS
  Filled 2015-08-07: qty 17

## 2015-08-11 ENCOUNTER — Encounter: Payer: Self-pay | Admitting: Family Medicine

## 2015-08-12 ENCOUNTER — Other Ambulatory Visit: Payer: Self-pay | Admitting: Family Medicine

## 2015-08-12 MED ORDER — LEVOTHYROXINE SODIUM 50 MCG PO TABS: 50.0000 ug | ORAL_TABLET | Freq: Every day | ORAL | Status: DC

## 2015-08-12 MED ORDER — FUROSEMIDE 40 MG PO TABS: ORAL_TABLET | ORAL | Status: DC

## 2015-08-12 MED ORDER — METOPROLOL SUCCINATE ER 25 MG PO TB24: ORAL_TABLET | ORAL | Status: DC

## 2015-08-12 MED ORDER — DILTIAZEM HCL ER COATED BEADS 300 MG PO CP24: 300.0000 mg | ORAL_CAPSULE | Freq: Every day | ORAL | Status: DC

## 2015-08-12 NOTE — Telephone Encounter (Signed)
Medication refilled per protocol. 

## 2015-08-15 NOTE — Procedures (Signed)
ELECTROENCEPHALOGRAM REPORT  Dates of Recording: 07/29/2015 to 07/30/2015  Patient's Name: Emily Hughes MRN: [id number] Date of Birth: 1941/01/08  Referring Provider: Dr. Ellouise Newer  Procedure: 24-hour ambulatory EEG  History: She had 2 focal seizures that started with left arm jerking, one of which was witnessed to progress to a GTC  Medications: Depakote, Zyloprim, ASA, Rocaltrol, Zyrtec, Catapres, Colchicine, Cardizem CD, Nexium, Amaryl, Lasix, Ferro-sequels, Novolog, Lantus, Levothyroxine, Elocon, Toprol XL, Pravachol, sodium bicarbonate, Carafate  Technical Summary: This is a 24-hour multichannel digital EEG recording measured by the international 10-20 system with electrodes applied with paste and impedances below 5000 ohms performed as portable with EKG monitoring.  The digital EEG was referentially recorded, reformatted, and digitally filtered in a variety of bipolar and referential montages for optimal display.    DESCRIPTION OF RECORDING: During maximal wakefulness, the background activity consisted of a symmetric 10 Hz posterior dominant rhythm which was reactive to eye opening.  There were no epileptiform discharges or focal slowing seen in wakefulness.  During the recording, the patient progresses through wakefulness, drowsiness, and Stage 2 sleep.  Again, there were no epileptiform discharges seen.  Events: There were no push button events.   There were no electrographic seizures seen.  EKG lead was unremarkable.  IMPRESSION: This 24-hour ambulatory EEG study is normal.    CLINICAL CORRELATION: A normal EEG does not exclude a clinical diagnosis of epilepsy. Typical events were not captured.  If further clinical questions remain, inpatient video EEG monitoring may be helpful.   Ellouise Newer, M.D.

## 2015-08-19 ENCOUNTER — Telehealth: Payer: Self-pay | Admitting: Family Medicine

## 2015-08-19 NOTE — Telephone Encounter (Signed)
Left msg with patient's sister to have patient give me a call.

## 2015-08-19 NOTE — Telephone Encounter (Signed)
-----   Message from Cameron Sprang, MD sent at 08/15/2015 10:19 AM EST ----- Pls let her know EEG Is normal, continue Depakote, we will discuss further on her visit. THanks

## 2015-08-19 NOTE — Telephone Encounter (Signed)
Patient returned my call. Notified her of results and advisement.

## 2015-08-25 ENCOUNTER — Encounter: Payer: Self-pay | Admitting: Neurology

## 2015-08-25 ENCOUNTER — Ambulatory Visit (INDEPENDENT_AMBULATORY_CARE_PROVIDER_SITE_OTHER): Payer: PPO | Admitting: Neurology

## 2015-08-25 VITALS — BP 126/68 | HR 86 | Resp 18 | Wt 245.0 lb

## 2015-08-25 DIAGNOSIS — E1122 Type 2 diabetes mellitus with diabetic chronic kidney disease: Secondary | ICD-10-CM

## 2015-08-25 DIAGNOSIS — R569 Unspecified convulsions: Secondary | ICD-10-CM

## 2015-08-25 DIAGNOSIS — N189 Chronic kidney disease, unspecified: Secondary | ICD-10-CM | POA: Diagnosis not present

## 2015-08-25 MED ORDER — DIVALPROEX SODIUM ER 500 MG PO TB24: ORAL_TABLET | ORAL | Status: DC

## 2015-08-25 NOTE — Progress Notes (Signed)
NEUROLOGY FOLLOW UP OFFICE NOTE  Emily Hughes [id number]  HISTORY OF PRESENT ILLNESS: I had the pleasure of seeing Emily Hughes in follow-up in the neurology clinic on 08/25/2015.  The patient was last seen a month ago for new onset seizure last December in the setting of hyperglycemia. Records and images were personally reviewed where available.  Her 24-hour EEG was normal. She reported feeling "weird" and staggering gait on Depakote, and self-reduced dose to 500mg  daily. She was switched to Depakote ER 500mg  qhs. She reports the "weird" feeling has resolved, but she still feels unbalanced, mostly after taking Depakote at night. No falls. She denies any further seizures or seizure-like symptoms. She reports glucose levels have been good. She denies any headaches, dizziness, focal numbness/tingling/weakness.  HPI: This is a pleasant 75 yo RH woman with a history of diabetes, hypertension, CKD, CAD, who was admitted to [facility]/12/15 for new onset seizure. She was in the shower when she suddenly felt dizzy like she would pass out, then her left arm extended up and started jerking uncontrollably. She fell to the floor and lay in the shower, denying any loss of consciousness. She thought her left leg would not move, but it did, and was able to get out of the shower. Her sister called and she told her she was not feeling good. Family brought her to the ER where she was witnessed to have left arm stiffening and jerking followed by a generalized tonic-clonic seizure. Glucose on arrival was [id number] with metabolic acidosis, and received treatment for DKA. Her CBC showed a WBC of 14.4, UDS negative. She reports that she had not yet eaten that morning and had not taken her insulin yet. She denied any focal weakness after the seizures. I personally reviewed MRI brain without contrast which did not show any acute changes, there was mild diffuse volume loss. Her routine wake and sleep EEG was normal. She was  started on Vimpat 100mg  BID, however once she was discharged, found that her insurance would not pay for Vimpat. She was started on Depakote 500mg  BID.   She denies any further seizures since hospital discharge. She denies any prior history of seizures, no headaches, further dizziness, diplopia, dysarthria, dysphagia, neck/back pain, focal numbness/tingling/weakness, bowel/bladder dysfunction. She denies any olfactory/gustatory hallucinations, deja vu, rising epigastric sensation. She used to have migraines but only has them infrequently now. She however does not like how Depakote makes her feel. She feels "crazy" on it, with a weird feeling of confusion, staggering when walking, poor balance. She has noticed she occasionally can't get her words out, "like my mind is trying to catch up." This is a new symptom for her. Dose was reduced by her PCP to 500mg  qhs 2 days ago. Her Depakote level on 500mg  BID was 49.2. She reports the staggering is better, but she still feels "weird" on the lower dose.  Epilepsy Risk Factors: She had a normal birth and early development. There is no history of febrile convulsions, CNS infections such as meningitis/encephalitis, significant traumatic brain injury, neurosurgical procedures, or family history of seizures.  PAST MEDICAL HISTORY: Past Medical History  Diagnosis Date  . High cholesterol   . Hypertension   . Anemia   . CAD (coronary artery disease)   . Pancreatitis   . Colon polyps   . Proteinuria   . DDD (degenerative disc disease)   . CRI (chronic renal insufficiency)   . GERD (gastroesophageal reflux disease)   . Diabetes mellitus  INSULIN DEPENDENT    MEDICATIONS: Current Outpatient Prescriptions on File Prior to Visit  Medication Sig Dispense Refill  . allopurinol (ZYLOPRIM) 300 MG tablet TAKE 1 TABLET ONCE DAILY. 90 tablet 2  . aspirin 81 MG tablet Take 81 mg by mouth daily.      . calcitRIOL (ROCALTROL) 0.25 MCG capsule Take 0.25 mcg by  mouth daily.    . cetirizine (ZYRTEC) 10 MG tablet Take 1 tablet (10 mg total) by mouth daily. 30 tablet 11  . cloNIDine (CATAPRES) 0.1 MG tablet Take 1 tablet (0.1 mg total) by mouth 2 (two) times daily. 180 tablet 3  . colchicine 0.6 MG tablet Take two tablets by mouth now then repeat one tablet in 1 hour 3 tablet 0  . diltiazem (CARDIZEM CD) 300 MG 24 hr capsule Take 1 capsule (300 mg total) by mouth daily. 90 capsule 1  . divalproex (DEPAKOTE ER) 500 MG 24 hr tablet Take 1 tablet at bedtime 30 tablet 3  . esomeprazole (NEXIUM) 40 MG capsule TAKE 1 CAPSULE TWICE DAILY BEFORE MEALS 60 capsule 3  . ferrous fumarate (FERRO-SEQUELS) 50 MG CR tablet Take 50 mg by mouth daily.      . furosemide (LASIX) 40 MG tablet TAKE 2 TABLETS IN THE MORNING AND 1 TABLET IN THE EVENING. 270 tablet 1  . glimepiride (AMARYL) 4 MG tablet TAKE 1 TABLET ONCE DAILY. 30 tablet 3  . insulin aspart (NOVOLOG) 100 UNIT/ML FlexPen Inject 10 Units into the skin daily with lunch. (Patient taking differently: Inject 10 Units into the skin. Sliding Scale) 15 mL 3  . Insulin Glargine (LANTUS) 100 UNIT/ML Solostar Pen Inject 38 Units into the skin daily. 15 pen 3  . levothyroxine (SYNTHROID, LEVOTHROID) 50 MCG tablet Take 1 tablet (50 mcg total) by mouth daily. 90 tablet 1  . metoprolol succinate (TOPROL-XL) 25 MG 24 hr tablet TAKE (1) TABLET DAILY. 90 tablet 1  . mometasone (ELOCON) 0.1 % ointment APPLY TOPICALLY DAILY. 45 g 3  . pravastatin (PRAVACHOL) 80 MG tablet TAKE ONE TABLET AT BEDTIME. 90 tablet 1  . sodium bicarbonate 650 MG tablet Take 1 tablet (650 mg total) by mouth 4 (four) times daily. 360 tablet 1  . sucralfate (CARAFATE) 1 G tablet Take 1 tablet (1 g total) by mouth 4 (four) times daily -  with meals and at bedtime. 120 tablet 0  . UNABLE TO FIND Takes a potassium suspension daily     No current facility-administered medications on file prior to visit.    ALLERGIES: Allergies  Allergen Reactions  . Ace  Inhibitors   . Actos [Pioglitazone Hydrochloride]   . Dust Mite Extract   . Dye Fdc Red [Erythrosine Red No. 3]     FAMILY HISTORY: Family History  Problem Relation Age of Onset  . Diabetes Mother   . Diabetes Father   . Diabetes Sister     SOCIAL HISTORY: Social History   Social History  . Marital Status: Widowed    Spouse Name: N/A  . Number of Children: N/A  . Years of Education: N/A   Occupational History  . Retired    Social History Main Topics  . Smoking status: Former Research scientist (life sciences)  . Smokeless tobacco: Never Used     Comment: quit smoking in 2000  . Alcohol Use: No  . Drug Use: No  . Sexual Activity: Not on file   Other Topics Concern  . Not on file   Social History Narrative    REVIEW  OF SYSTEMS: Constitutional: No fevers, chills, or sweats, no generalized fatigue, change in appetite Eyes: No visual changes, double vision, eye pain Ear, nose and throat: No hearing loss, ear pain, nasal congestion, sore throat Cardiovascular: No chest pain, palpitations Respiratory:  No shortness of breath at rest or with exertion, wheezes GastrointestinaI: No nausea, vomiting, diarrhea, abdominal pain, fecal incontinence Genitourinary:  No dysuria, urinary retention or frequency Musculoskeletal:  No neck pain, back pain Integumentary: No rash, pruritus, skin lesions Neurological: as above Psychiatric: No depression, insomnia, anxiety Endocrine: No palpitations, fatigue, diaphoresis, mood swings, change in appetite, change in weight, increased thirst Hematologic/Lymphatic:  No anemia, purpura, petechiae. Allergic/Immunologic: no itchy/runny eyes, nasal congestion, recent allergic reactions, rashes  PHYSICAL EXAM: Filed Vitals:   08/25/15 0906  BP: 126/68  Pulse: 86  Resp: 18   General: No acute distress Head:  Normocephalic/atraumatic Neck: supple, no paraspinal tenderness, full range of motion Heart:  Regular rate and rhythm Lungs:  Clear to auscultation  bilaterally Back: No paraspinal tenderness Skin/Extremities: No rash, no edema Neurological Exam: alert and oriented to person, place, and time. No aphasia or dysarthria. Fund of knowledge is appropriate.  Recent and remote memory are intact.  Attention and concentration are normal.    Able to name objects and repeat phrases.  Cranial nerves: CN I: not tested CN II: pupils equal, round and reactive to light, visual fields intact, fundi unremarkable. CN III, IV, VI: full range of motion, no nystagmus, no ptosis CN V: facial sensation intact CN VII: upper and lower face symmetric CN VIII: hearing intact to finger rub CN IX, X: gag intact, uvula midline CN XI: sternocleidomastoid and trapezius muscles intact CN XII: tongue midline Bulk & Tone: normal, no fasciculations. Motor: 5/5 throughout with no pronator drift. Sensation: intact to light touch. No extinction to double simultaneous stimulation. Romberg test negative Deep Tendon Reflexes: +1 throughout, no ankle clonus Plantar responses: downgoing bilaterally Cerebellar: no incoordination on finger to nose testing Gait: narrow-based and steady, slight difficulty with tandem walk but able Tremor: no resting tremor, +mild high frequency low amplitude postural and endpoint tremor bilaterally (L>R, noted on previous visit, patient reports this occurs when she is nervous)  IMPRESSION: This is a pleasant 75 yo RH woman with a history of hypertension, diabetes, CKD, with new onset seizures last 06/12/2015. She had 2 focal seizures that started with left arm jerking, one of which was witnessed to progress to a GTC. Her glucose level was reported to be in the 460s on admission. Focal motor seizures can be seen with hyperglycemia. She has no other clear epilepsy risk factors, neurological exam normal. Her MRI brain was unremarkable, routine and 24-hour EEG normal. We discussed the sensation of imbalance she has with Depakote (not all the time per  patient), no other side effects. At this point would continue Depakote ER 500mg  qhs until 6 months seizure-free, then consider tapering off medication. If Depakote side effects are intolerable, discussed option to switch to Lamotrigine, she would rather continue Depakote for now. She is anxious to return to driving, we again discussed [place] driving laws to stop driving until 6 months seizure-free. Continue control of glucose. She will follow-up in 7 months and knows to call for any changes.   Thank you for allowing me to participate in her care.  Please do not hesitate to call for any questions or concerns.  The duration of this appointment visit was 25 minutes of face-to-face time with the patient.  Greater than 50%  of this time was spent in counseling, explanation of diagnosis, planning of further management, and coordination of care.   Ellouise Newer, M.D.   CC: Dr. Dennard Schaumann

## 2015-08-25 NOTE — Patient Instructions (Signed)
1. Continue Depakote ER 500mg  at bedtime 2. As per [place] driving laws, no driving until 6 months seizure-free 3. Continue with control of sugar levels 4. Follow-up in 7 months, call for any changes  Seizure Precautions: 1. If medication has been prescribed for you to prevent seizures, take it exactly as directed.  Do not stop taking the medicine without talking to your doctor first, even if you have not had a seizure in a long time.   2. Avoid activities in which a seizure would cause danger to yourself or to others.  Don't operate dangerous machinery, swim alone, or climb in high or dangerous places, such as on ladders, roofs, or girders.  Do not drive unless your doctor says you may.  3. If you have any warning that you may have a seizure, lay down in a safe place where you can't hurt yourself.    4.  No driving for 6 months from last seizure, as per [facility].   Please refer to the following link on the [place] website for more information: http://[email or web address]/[email or web address]   5.  Maintain good sleep hygiene. Avoid alcohol  6.  Contact your doctor if you have any problems that may be related to the medicine you are taking.  7.  Call 911 and bring the patient back to the ED if:        A.  The seizure lasts longer than 5 minutes.       B.  The patient doesn't awaken shortly after the seizure  C.  The patient has new problems such as difficulty seeing, speaking or moving  D.  The patient was injured during the seizure  E.  The patient has a temperature over 102 F (39C)  F.  The patient vomited and now is having trouble breathing

## 2015-10-15 ENCOUNTER — Other Ambulatory Visit: Payer: Self-pay | Admitting: Family Medicine

## 2015-10-15 MED ORDER — INSULIN GLARGINE 100 UNIT/ML SOLOSTAR PEN: 38.0000 [IU] | PEN_INJECTOR | Freq: Every day | SUBCUTANEOUS | Status: DC

## 2015-10-16 ENCOUNTER — Other Ambulatory Visit: Payer: Self-pay | Admitting: *Deleted

## 2015-10-16 MED ORDER — INSULIN GLARGINE 100 UNIT/ML SOLOSTAR PEN: 38.0000 [IU] | PEN_INJECTOR | Freq: Every day | SUBCUTANEOUS | Status: DC

## 2015-10-16 NOTE — Telephone Encounter (Signed)
Received fax requesting refill on Lantus.   Refill appropriate and filled per protocol.

## 2015-10-21 ENCOUNTER — Telehealth: Payer: Self-pay | Admitting: Family Medicine

## 2015-10-21 NOTE — Telephone Encounter (Signed)
Pt is hoping that we can switch her Lantus RX to [facility] due to the high cost.  en[facility] pharmacy

## 2015-10-22 MED ORDER — INSULIN DETEMIR 100 UNIT/ML FLEXPEN: 38.0000 [IU] | PEN_INJECTOR | Freq: Every day | SUBCUTANEOUS | Status: DC

## 2015-10-22 NOTE — Telephone Encounter (Signed)
Med changed and sent to pharm

## 2015-10-31 ENCOUNTER — Other Ambulatory Visit: Payer: Self-pay | Admitting: Family Medicine

## 2015-10-31 NOTE — Telephone Encounter (Signed)
Refill appropriate and filled per protocol. 

## 2015-11-07 DIAGNOSIS — N184 Chronic kidney disease, stage 4 (severe): Secondary | ICD-10-CM | POA: Diagnosis not present

## 2015-11-07 DIAGNOSIS — I129 Hypertensive chronic kidney disease with stage 1 through stage 4 chronic kidney disease, or unspecified chronic kidney disease: Secondary | ICD-10-CM | POA: Diagnosis not present

## 2015-11-07 DIAGNOSIS — N2581 Secondary hyperparathyroidism of renal origin: Secondary | ICD-10-CM | POA: Diagnosis not present

## 2015-11-07 DIAGNOSIS — R809 Proteinuria, unspecified: Secondary | ICD-10-CM | POA: Diagnosis not present

## 2015-11-07 DIAGNOSIS — Z1231 Encounter for screening mammogram for malignant neoplasm of breast: Secondary | ICD-10-CM | POA: Diagnosis not present

## 2015-11-07 DIAGNOSIS — D631 Anemia in chronic kidney disease: Secondary | ICD-10-CM | POA: Diagnosis not present

## 2015-11-07 DIAGNOSIS — N189 Chronic kidney disease, unspecified: Secondary | ICD-10-CM | POA: Diagnosis not present

## 2015-11-07 DIAGNOSIS — M109 Gout, unspecified: Secondary | ICD-10-CM | POA: Diagnosis not present

## 2015-11-07 DIAGNOSIS — E1129 Type 2 diabetes mellitus with other diabetic kidney complication: Secondary | ICD-10-CM | POA: Diagnosis not present

## 2015-11-07 LAB — HM MAMMOGRAPHY: HM MAMMO: NORMAL (ref 0–4)

## 2016-01-14 ENCOUNTER — Other Ambulatory Visit: Payer: Self-pay | Admitting: Family Medicine

## 2016-01-14 ENCOUNTER — Encounter: Payer: Self-pay | Admitting: Family Medicine

## 2016-01-14 MED ORDER — DILTIAZEM HCL ER COATED BEADS 300 MG PO CP24: 300.0000 mg | ORAL_CAPSULE | Freq: Every day | ORAL | Status: DC

## 2016-01-14 MED ORDER — METOPROLOL SUCCINATE ER 25 MG PO TB24: ORAL_TABLET | ORAL | Status: DC

## 2016-01-14 MED ORDER — LEVOTHYROXINE SODIUM 50 MCG PO TABS: 50.0000 ug | ORAL_TABLET | Freq: Every day | ORAL | Status: DC

## 2016-01-14 MED ORDER — PRAVASTATIN SODIUM 80 MG PO TABS: ORAL_TABLET | ORAL | Status: DC

## 2016-01-14 NOTE — Telephone Encounter (Signed)
Medication called/sent to requested pharmacy and letter sent to pt to schedule ov and labs

## 2016-02-09 ENCOUNTER — Ambulatory Visit (INDEPENDENT_AMBULATORY_CARE_PROVIDER_SITE_OTHER): Payer: PPO | Admitting: Family Medicine

## 2016-02-09 ENCOUNTER — Encounter: Payer: Self-pay | Admitting: Family Medicine

## 2016-02-09 VITALS — BP 140/80 | HR 80 | Temp 98.3°F | Resp 18 | Ht 63.0 in | Wt 242.0 lb

## 2016-02-09 DIAGNOSIS — R2681 Unsteadiness on feet: Secondary | ICD-10-CM

## 2016-02-09 DIAGNOSIS — R413 Other amnesia: Secondary | ICD-10-CM

## 2016-02-09 DIAGNOSIS — Z794 Long term (current) use of insulin: Secondary | ICD-10-CM | POA: Diagnosis not present

## 2016-02-09 DIAGNOSIS — E038 Other specified hypothyroidism: Secondary | ICD-10-CM | POA: Diagnosis not present

## 2016-02-09 DIAGNOSIS — E119 Type 2 diabetes mellitus without complications: Secondary | ICD-10-CM | POA: Diagnosis not present

## 2016-02-09 LAB — COMPLETE METABOLIC PANEL WITH GFR
ALBUMIN: 4 g/dL (ref 3.6–5.1)
ALK PHOS: 65 U/L (ref 33–130)
ALT: 12 U/L (ref 6–29)
AST: 16 U/L (ref 10–35)
BUN: 62 mg/dL — AB (ref 7–25)
CALCIUM: 9.8 mg/dL (ref 8.6–10.4)
CO2: 26 mmol/L (ref 20–31)
CREATININE: 2.55 mg/dL — AB (ref 0.60–0.93)
Chloride: 103 mmol/L (ref 98–110)
GFR, Est African American: 21 mL/min — ABNORMAL LOW (ref >=60)
GFR, Est Non African American: 18 mL/min — ABNORMAL LOW (ref >=60)
Glucose, Bld: 96 mg/dL (ref 70–99)
Potassium: 4.7 mmol/L (ref 3.5–5.3)
Sodium: 141 mmol/L (ref 135–146)
TOTAL PROTEIN: 7.3 g/dL (ref 6.1–8.1)
Total Bilirubin: 0.4 mg/dL (ref 0.2–1.2)

## 2016-02-09 LAB — CBC WITH DIFFERENTIAL/PLATELET
BASOS ABS: 0 {cells}/uL (ref 0–200)
Basophils Relative: 0 %
EOS ABS: 164 {cells}/uL (ref 15–500)
Eosinophils Relative: 2 %
HCT: 32.4 % — ABNORMAL LOW (ref 35.0–45.0)
Hemoglobin: 10.4 g/dL — ABNORMAL LOW (ref 12.0–15.0)
LYMPHS PCT: 43 %
Lymphs Abs: 3526 cells/uL (ref 850–3900)
MCH: 29.4 pg (ref 27.0–33.0)
MCHC: 32.1 g/dL (ref 32.0–36.0)
MCV: 91.5 fL (ref 80.0–100.0)
MONOS PCT: 5 %
MPV: 11.5 fL (ref 7.5–12.5)
Monocytes Absolute: 410 cells/uL (ref 200–950)
NEUTROS PCT: 50 %
Neutro Abs: 4100 cells/uL (ref 1500–7800)
Platelets: 281 10*3/uL (ref 140–400)
RBC: 3.54 MIL/uL — ABNORMAL LOW (ref 3.80–5.10)
RDW: 14.6 % (ref 11.0–15.0)
WBC: 8.2 10*3/uL (ref 3.8–10.8)

## 2016-02-09 LAB — TSH: TSH: 2.73 m[IU]/L

## 2016-02-09 LAB — LIPID PANEL
CHOLESTEROL: 151 mg/dL (ref 125–200)
HDL: 42 mg/dL — ABNORMAL LOW (ref >=46)
LDL Cholesterol: 87 mg/dL (ref <130)
Total CHOL/HDL Ratio: 3.6 Ratio (ref <=5.0)
Triglycerides: 109 mg/dL (ref <150)
VLDL: 22 mg/dL (ref <30)

## 2016-02-09 NOTE — Progress Notes (Signed)
Subjective:    Patient ID: Emily Hughes, female    DOB: Nov 16, 1940, 75 y.o.   MRN: [id number]  HPI  06/26/15 Patient was recently admitted to the hospital with a seizure and DKA.  I have copied relevant portions of the discharge summary and included them below for my reference: Admit date: 06/12/2015 Discharge date: 06/14/2015  Recommendations for Outpatient Follow-up:  1. Pt will need to follow up with PCP in 2 weeks post discharge 2. Please obtain BMP in one week  Discharge Diagnoses:  New-onset seizure -No further seizures since admission  -Appreciate neurology consultation  -MRI brain negative for acute findings  -CT brain negative for acute findings  - EEG without any upper lip the form discharges - Continue Vimpat after d/c - I have informed the patient that she will not be able to drive for at least the next 6 months - Urine drug screen negative DKA and type 2 diabetes mellitus -Transitioned to subcutaneous insulin  -06/12/2015 hemoglobin A1c 11.4  - Patient's Lantus dose was adjusted based upon her CBGs -The patient will go home with Lantus 38 units daily CKD stage IV  -Baseline creatinine 2.5-2.8  -Continue calcitriol  -Continue bicarbonate  -We'll not restart losartan in the setting CKD stage IV  Hypertension  -Continue diltiazem, metoprolol succinate  -Hesitate to restart ARB in the setting CKD stage IV Hyperlipidemia -Continue pravastatin Coronary artery disease -No anginal symptoms presently -EKG without concerning ischemic changes -Continue aspirin   Since discharge from the hospital she has essentially resumed her previous insulin regimen. She is currently using 38 units of Lantus in the morning and 10 units of NovoLog with meals. She states that she is using a sliding scale correction but after further questioning she is not correcting her sugars at all depending on what the sugar is running. She is basically giving herself 10-11 units  with every meal matter what. She also reports fatigue since starting vipmat and hypersomnolence, but denies any seizure activity.  At that time,my plan was: We had a long discussion today about how to manage her blood sugars. I would like her to continue Lantus 38 units subcutaneous in the morning. She states that her fasting blood sugars are typically less than 130. Therefore it seems that it is her postprandial sugars that are causing her the issues. Therefore I have recommended that she use 10 units of NovoLog with meals plus a sliding scale correction factor based upon her sugars before meals. She will give herself an additional 2 units of Lantus for every 50 points above 150. <150-10 units 151-200-12 units 201-250-14 units 251-300-16 units 301-350-18 units 351+-20 units  Recheck sugars in  One week. I want her to write down what her sugars are before every meal and how many units of insulin she gave with that meal so that we can either titrate her baseline Lantus or titrate her baseline NovoLog depending upon the values.  07/03/15 Her sugars have become much better. Fasting blood sugars are typically between 101 120. She denies any hypoglycemia. Two-hour postprandial sugars are typically under 180. Patient states that she is very seldomly having to use the sliding scale. Typically 10 units of insulin per meal is all she is requiring her blood sugars are typically running around 150. Unfortunately she has no blood sugars for me to review today.  At that time, my plan was: Based on her report, her sugars are doing excellent. I'll make no changes in her diabetic regimen. I have asked  her to bring in her blood sugars so that I can review them to verify that they truly are as good as she is reporting. She also complains of pain in the dorsum of her left hand and along the left second digit. This was the hand that was seizing. She is tender to palpation all along the left second metacarpal and left  digit. I will proceed with an x-ray to rule out a traumatic injury during the seizure  07/17/15 Family members have called me and told me that the patient is falling and staggering into walls. She is also confused and demonstrating memory loss. She is also experiencing urinary incontinence. The symptoms were not present prior to her hospitalization raising the concern about possible medication side effects or possible delirium from a previously hidden underlying dementia.   She is here today for follow-up.  She scored 25/30 on Mini-Mental status exam today, the patient scores 4 out of 5 on date , 5 out of 5 on location , 2 out of 3 on rapid recall , 2 out of 5 on serial sevens. On mini cog , she draws a round face of a clock. She writes all 12 numbers but she  Repeats #12. Also the spacing is inappropriate with 9 crammed into the 7 position. She is unable to draw the hands on the clock at the correct time or even in the correct spacing At that time, my plan was:  it is possible that the patient may be having medication side effects. After further review she is actually not on lacosamide.    Her medicine list is incorrect. She was actually switched to Depakote 500 mg by mouth twice a day. Therefore I will check a Depakote level. I will decrease Depakote to 500 mg at night. The symptoms were not present prior to her hospitalization. However it is also possible that she has had mild dementia that has been exacerbated by medicine changes. I see no evidence on her exam today of delirium. Hopefully decreasing the Depakote or possibly stopping it would improve this. I will await the Depakote level.  02/09/16 She is here today for follow-up. She's had no further seizures since her last visit. She has started driving again. She states that her memory is back to her baseline. She is no longer confused. She denies any short-term memory loss. She is not falling. She is not staggering into walls. However she continues to  have problems with her balance. She did not feel is stable on her feet. She would be amenable to physical therapy to try to improve this. Her fasting blood sugars are between 80 and 120. She is taking Levemir 38 units daily in the morning. She is doing sliding scale NovoLog as needed with meals. Her blood pressure today is excellent. She denies any chest pain shortness of breath or dyspnea on exertion Past Medical History:  Diagnosis Date  . Anemia   . CAD (coronary artery disease)   . Colon polyps   . CRI (chronic renal insufficiency)   . DDD (degenerative disc disease)   . Diabetes mellitus    INSULIN DEPENDENT  . GERD (gastroesophageal reflux disease)   . High cholesterol   . Hypertension   . Pancreatitis   . Proteinuria    Past Surgical History:  Procedure Laterality Date  . ABDOMINAL HYSTERECTOMY    . ANGIOPLASTY    . ANKLE SURGERY Right   . BACK SURGERY    . TONSILLECTOMY  Current Outpatient Prescriptions on File Prior to Visit  Medication Sig Dispense Refill  . allopurinol (ZYLOPRIM) 300 MG tablet TAKE 1 TABLET ONCE DAILY. 90 tablet 2  . aspirin 81 MG tablet Take 81 mg by mouth daily.      . calcitRIOL (ROCALTROL) 0.25 MCG capsule Take 0.25 mcg by mouth daily.    . cetirizine (ZYRTEC) 10 MG tablet Take 1 tablet (10 mg total) by mouth daily. 30 tablet 11  . cloNIDine (CATAPRES) 0.1 MG tablet Take 1 tablet (0.1 mg total) by mouth 2 (two) times daily. 180 tablet 3  . colchicine 0.6 MG tablet Take two tablets by mouth now then repeat one tablet in 1 hour 3 tablet 0  . diltiazem (CARDIZEM CD) 300 MG 24 hr capsule Take 1 capsule (300 mg total) by mouth daily. 90 capsule 1  . divalproex (DEPAKOTE ER) 500 MG 24 hr tablet Take 1 tablet at bedtime 90 tablet 3  . esomeprazole (NEXIUM) 40 MG capsule TAKE 1 CAPSULE TWICE DAILY BEFORE MEALS 60 capsule 3  . ferrous fumarate (FERRO-SEQUELS) 50 MG CR tablet Take 50 mg by mouth daily.      . furosemide (LASIX) 40 MG tablet TAKE 2 TABLETS  IN THE MORNING AND 1 TABLET IN THE EVENING. 270 tablet 1  . glimepiride (AMARYL) 4 MG tablet TAKE 1 TABLET ONCE DAILY. 30 tablet 3  . insulin aspart (NOVOLOG) 100 UNIT/ML FlexPen Inject 10 Units into the skin daily with lunch. (Patient taking differently: Inject 10 Units into the skin. Sliding Scale) 15 mL 3  . Insulin Detemir (LEVEMIR) 100 UNIT/ML Pen Inject 38 Units into the skin daily. 45 pen 1  . levothyroxine (SYNTHROID, LEVOTHROID) 50 MCG tablet Take 1 tablet (50 mcg total) by mouth daily. 90 tablet 1  . metoprolol succinate (TOPROL-XL) 25 MG 24 hr tablet TAKE (1) TABLET DAILY. 90 tablet 1  . mometasone (ELOCON) 0.1 % ointment APPLY TOPICALLY DAILY. 45 g 3  . pravastatin (PRAVACHOL) 80 MG tablet TAKE ONE TABLET AT BEDTIME. 90 tablet 1  . sodium bicarbonate 650 MG tablet Take 1 tablet (650 mg total) by mouth 4 (four) times daily. 360 tablet 1  . sucralfate (CARAFATE) 1 G tablet Take 1 tablet (1 g total) by mouth 4 (four) times daily -  with meals and at bedtime. 120 tablet 0  . UNABLE TO FIND Takes a potassium suspension daily     No current facility-administered medications on file prior to visit.    Allergies  Allergen Reactions  . Ace Inhibitors   . Actos [Pioglitazone Hydrochloride]   . Dust Mite Extract   . Dye Fdc Red [Erythrosine Red No. 3]    Social History   Social History  . Marital status: Widowed    Spouse name: N/A  . Number of children: N/A  . Years of education: N/A   Occupational History  . Retired    Social History Main Topics  . Smoking status: Former Research scientist (life sciences)  . Smokeless tobacco: Never Used     Comment: quit smoking in 2000  . Alcohol use No  . Drug use: No  . Sexual activity: Not on file   Other Topics Concern  . Not on file   Social History Narrative  . No narrative on file    Review of Systems  All other systems reviewed and are negative.      Objective:   Physical Exam  Constitutional: She appears well-developed and well-nourished. No  distress.  Cardiovascular: Normal rate,  regular rhythm and normal heart sounds.   Pulmonary/Chest: Effort normal and breath sounds normal. No respiratory distress. She has no wheezes. She has no rales.  Abdominal: Soft. Bowel sounds are normal.  Skin: She is not diaphoretic.  Vitals reviewed.       Assessment & Plan:  Diabetes mellitus, type II, insulin dependent ([place]) - Plan: CBC with Differential/Platelet, COMPLETE METABOLIC PANEL WITH GFR, Lipid panel, Hemoglobin A1c  Other specified hypothyroidism - Plan: TSH  Memory loss  Gait instability - Plan: Ambulatory referral to Physical Therapy  I'm happy with her blood pressure. Her reported blood sugars sound excellent. I will check a hemoglobin A1c to verify. For her hypothyroidism, I will monitor a TSH. Thankfully her memory loss seems to be transient related to her seizure and seizure medication. This seems to have resolved. She continues to have problems with poor balance. Therefore I will consult physical therapy hopefully to help improve this. I will check a fasting lipid panel. Goal LDL cholesterol is less than 100.

## 2016-02-10 DIAGNOSIS — E1129 Type 2 diabetes mellitus with other diabetic kidney complication: Secondary | ICD-10-CM | POA: Diagnosis not present

## 2016-02-10 DIAGNOSIS — R809 Proteinuria, unspecified: Secondary | ICD-10-CM | POA: Diagnosis not present

## 2016-02-10 DIAGNOSIS — D631 Anemia in chronic kidney disease: Secondary | ICD-10-CM | POA: Diagnosis not present

## 2016-02-10 DIAGNOSIS — N2581 Secondary hyperparathyroidism of renal origin: Secondary | ICD-10-CM | POA: Diagnosis not present

## 2016-02-10 DIAGNOSIS — I129 Hypertensive chronic kidney disease with stage 1 through stage 4 chronic kidney disease, or unspecified chronic kidney disease: Secondary | ICD-10-CM | POA: Diagnosis not present

## 2016-02-10 DIAGNOSIS — M109 Gout, unspecified: Secondary | ICD-10-CM | POA: Diagnosis not present

## 2016-02-10 DIAGNOSIS — N184 Chronic kidney disease, stage 4 (severe): Secondary | ICD-10-CM | POA: Diagnosis not present

## 2016-02-10 DIAGNOSIS — Z6841 Body Mass Index (BMI) 40.0 and over, adult: Secondary | ICD-10-CM | POA: Diagnosis not present

## 2016-02-10 LAB — HEMOGLOBIN A1C
Hgb A1c MFr Bld: 6.7 % — ABNORMAL HIGH (ref <5.7)
MEAN PLASMA GLUCOSE: 146 mg/dL

## 2016-02-18 ENCOUNTER — Encounter: Payer: Self-pay | Admitting: Family Medicine

## 2016-03-15 ENCOUNTER — Encounter: Payer: Self-pay | Admitting: Physical Therapy

## 2016-03-15 ENCOUNTER — Ambulatory Visit: Payer: PPO | Attending: Family Medicine | Admitting: Physical Therapy

## 2016-03-15 DIAGNOSIS — R2681 Unsteadiness on feet: Secondary | ICD-10-CM | POA: Insufficient documentation

## 2016-03-15 DIAGNOSIS — R2689 Other abnormalities of gait and mobility: Secondary | ICD-10-CM | POA: Diagnosis not present

## 2016-03-15 DIAGNOSIS — M6281 Muscle weakness (generalized): Secondary | ICD-10-CM | POA: Diagnosis not present

## 2016-03-16 NOTE — Therapy (Signed)
[facility] [address] [place], [place], [address] Phone: [phone]   Fax:  [phone]  Physical Therapy Evaluation  Patient Details  Name: Emily Hughes MRN: [id number] Date of Birth: January 19, 1941 Referring Provider: Dr. Jenna Luo  Encounter Date: 03/15/2016      PT End of Session - 03/16/16 1125    Visit Number 1   Number of Visits 9   Date for PT Re-Evaluation 04/14/16   Authorization Type [employer]   Authorization Time Period 03-15-16 - 05-14-16   PT Start Time 0850   PT Stop Time 0931   PT Time Calculation (min) 41 min      Past Medical History:  Diagnosis Date  . Anemia   . CAD (coronary artery disease)   . Colon polyps   . CRI (chronic renal insufficiency)   . DDD (degenerative disc disease)   . Diabetes mellitus    INSULIN DEPENDENT  . GERD (gastroesophageal reflux disease)   . High cholesterol   . Hypertension   . Pancreatitis   . Proteinuria     Past Surgical History:  Procedure Laterality Date  . ABDOMINAL HYSTERECTOMY    . ANGIOPLASTY    . ANKLE SURGERY Right   . BACK SURGERY    . TONSILLECTOMY      There were no vitals filed for this visit.       Subjective Assessment - 03/16/16 1106    Subjective Pt reports she has had balance problems and speech changes since she had 2 seizures on 06-12-15: pt was hospitalized at [facility]  06-12-15 until 06-15-15   Pertinent History 2 seizures on 06-11-16 due to DKA   Patient Stated Goals improve balance and be able to safely walk without a device    Currently in Pain? No/denies            [facility] PT Assessment - 03/16/16 0001      Assessment   Medical Diagnosis Gait abnormality s/p seizures   Referring Provider Dr. Jenna Luo   Onset Date/Surgical Date 06/12/15     Precautions   Precautions Fall     Restrictions   Weight Bearing Restrictions No     Balance Screen   Has the patient fallen in the past 6  months No   Has the patient had a decrease in activity level because of a fear of falling?  No   Is the patient reluctant to leave their home because of a fear of falling?  No     Home Environment   Living Environment Private residence   Living Arrangements Alone   Type of [place] to enter   Entrance Stairs-Number of Steps 2   Home Layout One level   [place] bench     Prior Function   Level of Independence Independent     ROM / Strength   AROM / PROM / Strength Strength     Strength   Overall Strength Deficits  weakness in hip extensors noted with sit to stand transfers     Ambulation/Gait   Ambulation/Gait Yes   Ambulation/Gait Assistance 5: Supervision   Ambulation/Gait Assistance Details unsteady gait - deviations include lateral weight shift with decreased push off and decr. initial heel contact in stance   Ambulation Distance (Feet) 100 Feet   Assistive device None   Gait Pattern Step-through pattern   Ambulation Surface Level;Indoor   Gait velocity 13.50 secs= 2.43 ft/sec  Standardized Balance Assessment   Standardized Balance Assessment Timed Up and Go Test     Berg Balance Test   Sit to Stand Able to stand  independently using hands   Standing Unsupported Able to stand 30 seconds unsupported  1"30 secs before needing to sit down   Sitting with Back Unsupported but Feet Supported on Floor or Stool Able to sit safely and securely 2 minutes   Stand to Sit Controls descent by using hands   Transfers Able to transfer safely, definite need of hands   Standing Unsupported with Eyes Closed Able to stand 10 seconds with supervision   Standing Ubsupported with Feet Together Needs help to attain position and unable to hold for 15 seconds   From Standing, Reach Forward with Outstretched Arm Can reach forward >12 cm safely (5")   From Standing Position, Pick up Object from Floor Unable to try/needs assist to keep balance   From Standing  Position, Turn to Look Behind Over each Shoulder Turn sideways only but maintains balance   Turn 360 Degrees Able to turn 360 degrees safely but slowly  6.75 to R: 6.31 to L   Standing Unsupported, Alternately Place Feet on Step/Stool Able to complete >2 steps/needs minimal assist   Standing Unsupported, One Foot in Front Able to take small step independently and hold 30 seconds   Standing on One Leg Unable to try or needs assist to prevent fall   Total Score 28     Timed Up and Go Test   Normal TUG (seconds) 15.53  no device                      Self care:     PT Education - 03/16/16 1133    Education provided Yes   Education Details educated pt on benefit of use of RW to reduce fall risk and to increase mobility/activity at this time; pt is at fall risk per Merrilee Jansky and TUG scores   Person(s) Educated Patient   Methods Explanation   Comprehension Verbalized understanding          PT Short Term Goals - 03/16/16 1133      PT SHORT TERM GOAL #1   Title STG's = LTG's           PT Long Term Goals - 03/16/16 1134      PT LONG TERM GOAL #1   Title Pt will increase gait velocity to >/= 2.9 ft/sec without device for incr. gait efficiency.  (04-14-16)   Baseline 2.43 ft/sec without device   Time 4   Period Weeks   Status New     PT LONG TERM GOAL #2   Title Increase TUG score to </= 13.0 secs without device for decreased fall risk.  (04-14-16)   Baseline 15.53 secs without device   Time 4   Period Weeks   Status New     PT LONG TERM GOAL #3   Title Increase strength in bil. LE's so pt able to perform sit to stand x 3 reps from 20" high mat surface without UE support.  (04-14-16)   Time 4   Period Weeks   Status New     PT LONG TERM GOAL #4   Title Independent in HEP for balance and LE strengthening exercises.  (04-14-16)   Time 4   Period Weeks   Status New               Plan - 03/16/16  49    Clinical Impression Statement Pt is a 75  year old lady s/p 2 seizures in Dec. 2016 due to DKA.  Pt was hospitalized from 12-8 - 06-14-15.  Pt reports balance problems and unstady gait since she had these 2 seizures.  Pt presents with unsteady gait, weakness in hip extensors with difficulty with sit to stand tranfers and gait deviations due to balance dysfunction.  PMH includes s/p back surgery in 1990's with residual RLE weakness per pt report, Type 2 DM, HTN and CAD.     Rehab Potential Good   PT Frequency 2x / week   PT Duration 4 weeks   PT Treatment/Interventions ADLs/Self Care Home Management;DME Instruction;Gait training;Stair training;Therapeutic activities;Therapeutic exercise;Balance training;Neuromuscular re-education;Patient/family education   PT Next Visit Plan begin HEP for balance exercises and LE strengthening   PT Home Exercise Plan see above   Consulted and Agree with Plan of Care Patient      Patient will benefit from skilled therapeutic intervention in order to improve the following deficits and impairments:  Abnormal gait, Decreased activity tolerance, Decreased balance, Decreased strength, Decreased knowledge of use of DME, Decreased endurance, Decreased coordination  Visit Diagnosis: Other abnormalities of gait and mobility - Plan: PT plan of care cert/re-cert  Muscle weakness (generalized) - Plan: PT plan of care cert/re-cert  Unsteadiness on feet - Plan: PT plan of care cert/re-cert      G-Codes - 03/49/17 1139    Functional Assessment Tool Used Berg score 28/56:  TUG score 15.53 secs;  Gait velocity 2.43 ft/sec without device   Functional Limitation Mobility: Walking and moving around   Mobility: Walking and Moving Around Current Status [phone]) At least 40 percent but less than 60 percent impaired, limited or restricted   Mobility: Walking and Moving Around Goal Status [phone]) At least 20 percent but less than 40 percent impaired, limited or restricted       Problem List Patient Active Problem List    Diagnosis Date Noted  . Convulsions ([place]) 07/24/2015  . Type 2 diabetes mellitus with chronic kidney disease, without long-term current use of insulin ([place]) 07/24/2015  . Leukocytosis   . Chronic kidney disease (CKD), stage IV (severe) ([place]) 06/13/2015  . Seizures ([place]) 06/12/2015  . DKA (diabetic ketoacidoses) ([place]) 06/12/2015  . Seizure ([place]) 06/12/2015  . High cholesterol   . CAD (coronary artery disease)   . Proteinuria   . GERD (gastroesophageal reflux disease)   . DYSLIPIDEMIA 02/27/2010  . OBESITY, MORBID 02/27/2010  . DM 02/24/2010  . RHEUMATIC FEVER 02/24/2010  . Essential hypertension 02/24/2010  . RENAL INSUFFICIENCY, CHRONIC 02/24/2010    Alda Lea, PT 03/16/2016, 3:48 PM  [facility] [address] [place], [place], [address] Phone: [phone]   Fax:  [phone]  Name: Emily Hughes MRN: [id number] Date of Birth: [birth year]

## 2016-03-24 ENCOUNTER — Ambulatory Visit (INDEPENDENT_AMBULATORY_CARE_PROVIDER_SITE_OTHER): Payer: PPO | Admitting: Neurology

## 2016-03-24 ENCOUNTER — Encounter: Payer: Self-pay | Admitting: Neurology

## 2016-03-24 ENCOUNTER — Ambulatory Visit: Payer: PPO | Admitting: Physical Therapy

## 2016-03-24 VITALS — BP 144/70 | HR 84 | Temp 98.1°F | Ht 63.0 in | Wt 245.4 lb

## 2016-03-24 DIAGNOSIS — M6281 Muscle weakness (generalized): Secondary | ICD-10-CM

## 2016-03-24 DIAGNOSIS — R2681 Unsteadiness on feet: Secondary | ICD-10-CM

## 2016-03-24 DIAGNOSIS — R2689 Other abnormalities of gait and mobility: Secondary | ICD-10-CM | POA: Diagnosis not present

## 2016-03-24 DIAGNOSIS — R569 Unspecified convulsions: Secondary | ICD-10-CM | POA: Diagnosis not present

## 2016-03-24 DIAGNOSIS — E1122 Type 2 diabetes mellitus with diabetic chronic kidney disease: Secondary | ICD-10-CM

## 2016-03-24 MED ORDER — DIVALPROEX SODIUM ER 500 MG PO TB24: ORAL_TABLET | ORAL | 3 refills | Status: DC

## 2016-03-24 NOTE — Progress Notes (Signed)
NEUROLOGY FOLLOW UP OFFICE NOTE  Emily Hughes [id number]  HISTORY OF PRESENT ILLNESS: I had the pleasure of seeing Emily Hughes in follow-up in the neurology clinic on 03/24/2016.  The patient was last seen 7 months ago for new onset seizure last December 2016 in the setting of hyperglycemia. Her 24-hour EEG was normal. She reported feeling "weird" and staggering gait on Depakote, and self-reduced dose to 500mg  daily. She was switched to Depakote ER 500mg  qhs and denies any further similar episodes, no left arm jerking, staring/unresponsive episodes, gaps in time, or convulsions. She still does not like the Depakote, sometimes she wakes up with a headache and feels this is due to the Depakote. Her memory is not as good. She denies any focal numbness/tingling/weakness, no falls. Glucose levels have been good. She is back to driving.  HPI: This is a pleasant 75 yo RH woman with a history of diabetes, hypertension, CKD, CAD, who was admitted to [facility] last 06/12/15 for new onset seizure. She was in the shower when she suddenly felt dizzy like she would pass out, then her left arm extended up and started jerking uncontrollably. She fell to the floor and lay in the shower, denying any loss of consciousness. She thought her left leg would not move, but it did, and was able to get out of the shower. Her sister called and she told her she was not feeling good. Family brought her to the ER where she was witnessed to have left arm stiffening and jerking followed by a generalized tonic-clonic seizure. Glucose on arrival was 616 with metabolic acidosis, and received treatment for DKA. Her CBC showed a WBC of 14.4, UDS negative. She reports that she had not yet eaten that morning and had not taken her insulin yet. She denied any focal weakness after the seizures. I personally reviewed MRI brain without contrast which did not show any acute changes, there was mild diffuse volume loss. Her routine wake and sleep EEG  was normal. She was started on Vimpat 100mg  BID, however once she was discharged, found that her insurance would not pay for Vimpat. She was started on Depakote 500mg  BID.   She denies any further seizures since hospital discharge. She denies any prior history of seizures, no headaches, further dizziness, diplopia, dysarthria, dysphagia, neck/back pain, focal numbness/tingling/weakness, bowel/bladder dysfunction. She denies any olfactory/gustatory hallucinations, deja vu, rising epigastric sensation. She used to have migraines but only has them infrequently now. She however does not like how Depakote makes her feel. She feels "crazy" on it, with a weird feeling of confusion, staggering when walking, poor balance. She has noticed she occasionally can't get her words out, "like my mind is trying to catch up." This is a new symptom for her. Dose was reduced by her PCP to 500mg  qhs 2 days ago. Her Depakote level on 500mg  BID was 49.2. She reports the staggering is better, but she still feels "weird" on the lower dose.  Epilepsy Risk Factors: She had a normal birth and early development. There is no history of febrile convulsions, CNS infections such as meningitis/encephalitis, significant traumatic brain injury, neurosurgical procedures, or family history of seizures.  PAST MEDICAL HISTORY: Past Medical History:  Diagnosis Date  . Anemia   . CAD (coronary artery disease)   . Colon polyps   . CRI (chronic renal insufficiency)   . DDD (degenerative disc disease)   . Diabetes mellitus    INSULIN DEPENDENT  . GERD (gastroesophageal reflux disease)   .  High cholesterol   . Hypertension   . Pancreatitis   . Proteinuria     MEDICATIONS: Current Outpatient Prescriptions on File Prior to Visit  Medication Sig Dispense Refill  . allopurinol (ZYLOPRIM) 300 MG tablet TAKE 1 TABLET ONCE DAILY. 90 tablet 2  . aspirin 81 MG tablet Take 81 mg by mouth daily.      . calcitRIOL (ROCALTROL) 0.25 MCG capsule  Take 0.25 mcg by mouth daily.    . cetirizine (ZYRTEC) 10 MG tablet Take 1 tablet (10 mg total) by mouth daily. 30 tablet 11  . cloNIDine (CATAPRES) 0.1 MG tablet Take 1 tablet (0.1 mg total) by mouth 2 (two) times daily. 180 tablet 3  . colchicine 0.6 MG tablet Take two tablets by mouth now then repeat one tablet in 1 hour 3 tablet 0  . diltiazem (CARDIZEM CD) 300 MG 24 hr capsule Take 1 capsule (300 mg total) by mouth daily. 90 capsule 1  . divalproex (DEPAKOTE ER) 500 MG 24 hr tablet Take 1 tablet at bedtime 90 tablet 3  . esomeprazole (NEXIUM) 40 MG capsule TAKE 1 CAPSULE TWICE DAILY BEFORE MEALS 60 capsule 3  . ferrous fumarate (FERRO-SEQUELS) 50 MG CR tablet Take 50 mg by mouth daily.      . furosemide (LASIX) 40 MG tablet TAKE 2 TABLETS IN THE MORNING AND 1 TABLET IN THE EVENING. 270 tablet 1  . glimepiride (AMARYL) 4 MG tablet TAKE 1 TABLET ONCE DAILY. 30 tablet 3  . insulin aspart (NOVOLOG) 100 UNIT/ML FlexPen Inject 10 Units into the skin daily with lunch. (Patient taking differently: Inject 10 Units into the skin. Sliding Scale) 15 mL 3  . Insulin Detemir (LEVEMIR) 100 UNIT/ML Pen Inject 38 Units into the skin daily. 45 pen 1  . levothyroxine (SYNTHROID, LEVOTHROID) 50 MCG tablet Take 1 tablet (50 mcg total) by mouth daily. 90 tablet 1  . metoprolol succinate (TOPROL-XL) 25 MG 24 hr tablet TAKE (1) TABLET DAILY. 90 tablet 1  . mometasone (ELOCON) 0.1 % ointment APPLY TOPICALLY DAILY. 45 g 3  . pravastatin (PRAVACHOL) 80 MG tablet TAKE ONE TABLET AT BEDTIME. 90 tablet 1  . sodium bicarbonate 650 MG tablet Take 1 tablet (650 mg total) by mouth 4 (four) times daily. 360 tablet 1  . sucralfate (CARAFATE) 1 G tablet Take 1 tablet (1 g total) by mouth 4 (four) times daily -  with meals and at bedtime. 120 tablet 0  . UNABLE TO FIND Takes a potassium suspension daily     No current facility-administered medications on file prior to visit.     ALLERGIES: Allergies  Allergen Reactions  .  Ace Inhibitors   . Actos [Pioglitazone Hydrochloride]   . Dust Mite Extract   . Dye Fdc Red [Erythrosine Red No. 3]     FAMILY HISTORY: Family History  Problem Relation Age of Onset  . Diabetes Mother   . Diabetes Father   . Diabetes Sister     SOCIAL HISTORY: Social History   Social History  . Marital status: Widowed    Spouse name: N/A  . Number of children: N/A  . Years of education: N/A   Occupational History  . Retired    Social History Main Topics  . Smoking status: Former Research scientist (life sciences)  . Smokeless tobacco: Never Used     Comment: quit smoking in 2000  . Alcohol use No  . Drug use: No  . Sexual activity: Not on file   Other Topics Concern  .  Not on file   Social History Narrative  . No narrative on file    REVIEW OF SYSTEMS: Constitutional: No fevers, chills, or sweats, no generalized fatigue, change in appetite Eyes: No visual changes, double vision, eye pain Ear, nose and throat: No hearing loss, ear pain, nasal congestion, sore throat Cardiovascular: No chest pain, palpitations Respiratory:  No shortness of breath at rest or with exertion, wheezes GastrointestinaI: No nausea, vomiting, diarrhea, abdominal pain, fecal incontinence Genitourinary:  No dysuria, urinary retention or frequency Musculoskeletal:  No neck pain, back pain Integumentary: No rash, pruritus, skin lesions Neurological: as above Psychiatric: No depression, insomnia, anxiety Endocrine: No palpitations, fatigue, diaphoresis, mood swings, change in appetite, change in weight, increased thirst Hematologic/Lymphatic:  No anemia, purpura, petechiae. Allergic/Immunologic: no itchy/runny eyes, nasal congestion, recent allergic reactions, rashes  PHYSICAL EXAM: Vitals:   03/24/16 1118  BP: (!) 144/70  Pulse: 84  Temp: 98.1 F (36.7 C)   General: No acute distress Head:  Normocephalic/atraumatic Neck: supple, no paraspinal tenderness, full range of motion Heart:  Regular rate and  rhythm Lungs:  Clear to auscultation bilaterally Back: No paraspinal tenderness Skin/Extremities: No rash, no edema Neurological Exam: alert and oriented to person, place, and time. No aphasia or dysarthria. Fund of knowledge is appropriate.  Recent and remote memory are intact.  Attention and concentration are normal.    Able to name objects and repeat phrases.  Cranial nerves: CN I: not tested CN II: pupils equal, round and reactive to light, visual fields intact, fundi unremarkable. CN III, IV, VI: full range of motion, no nystagmus, no ptosis CN V: facial sensation intact CN VII: upper and lower face symmetric CN VIII: hearing intact to finger rub CN IX, X: gag intact, uvula midline CN XI: sternocleidomastoid and trapezius muscles intact CN XII: tongue midline Bulk & Tone: normal, no fasciculations. Motor: 5/5 throughout with no pronator drift. Sensation: intact to light touch. No extinction to double simultaneous stimulation. Romberg test negative Deep Tendon Reflexes: +1 throughout, no ankle clonus Plantar responses: downgoing bilaterally Cerebellar: no incoordination on finger to nose testing Gait: narrow-based and steady, slight difficulty with tandem walk but able Tremor: no resting tremor, +mild high frequency low amplitude postural and endpoint tremor bilaterally (L>R, similar to prior visit)  IMPRESSION: This is a pleasant 75 yo RH woman with a history of hypertension, diabetes, CKD, with new onset seizures last 06/12/2015. She had 2 focal seizures that started with left arm jerking, one of which was witnessed to progress to a GTC. Her glucose level was reported to be in the 460s on admission. Focal motor seizures can be seen with hyperglycemia. She has no other clear epilepsy risk factors, neurological exam normal. Her MRI brain was unremarkable, routine and 24-hour EEG normal. Seizure possibly due to hyperglycemia. We discussed that we can start tapering off Depakote,  however would recommend no driving for 6 months with medication taper. She does not want to stop driving and decided to continue medication. We discussed option to switch to a different AED such as Lamotrigine, she does not want to switch and will continue on Depakote ER 500mg  qhs. We agreed to another re-evaluation in 6 months, and once she is ready to hold off on driving, we will plan to taper off medication, unless symptoms change. Continue control of glucose. She will follow-up in 6 months and knows to call for any changes.   Thank you for allowing me to participate in her care.  Please do not hesitate  to call for any questions or concerns.  The duration of this appointment visit was 15 minutes of face-to-face time with the patient.  Greater than 50% of this time was spent in counseling, explanation of diagnosis, planning of further management, and coordination of care.   Ellouise Newer, M.D.   CC: Dr. Dennard Schaumann

## 2016-03-24 NOTE — Patient Instructions (Addendum)
1. Continue control of your sugar levels 2. Continue Depakote ER 500mg  at bedtime 3. Once we decide to get off medication, recommendation is to hold off on driving for 6 months 4. Follow-up in 6 months, call for any changes  Seizure Precautions: 1. If medication has been prescribed for you to prevent seizures, take it exactly as directed.  Do not stop taking the medicine without talking to your doctor first, even if you have not had a seizure in a long time.   2. Avoid activities in which a seizure would cause danger to yourself or to others.  Don't operate dangerous machinery, swim alone, or climb in high or dangerous places, such as on ladders, roofs, or girders.  Do not drive unless your doctor says you may.  3. If you have any warning that you may have a seizure, lay down in a safe place where you can't hurt yourself.    4.  No driving for 6 months from last seizure, as per [facility].   Please refer to the following link on the [place] website for more information: http://[email or web address].org/answerplace/Social/driving/drivingu.cfm   5.  Maintain good sleep hygiene. Avoid alcohol.  6.  Contact your doctor if you have any problems that may be related to the medicine you are taking.  7.  Call 911 and bring the patient back to the ED if:        A.  The seizure lasts longer than 5 minutes.       B.  The patient doesn't awaken shortly after the seizure  C.  The patient has new problems such as difficulty seeing, speaking or moving  D.  The patient was injured during the seizure  E.  The patient has a temperature over 102 F (39C)  F.  The patient vomited and now is having trouble breathing

## 2016-03-24 NOTE — Therapy (Signed)
[facility] [address] [place], [place], [address] Phone: [phone]   Fax:  [phone]  Physical Therapy Treatment  Patient Details  Name: Emily Hughes MRN: [id number] Date of Birth: [birth year] Referring Provider: Dr. Jenna Luo  Encounter Date: 03/24/2016      PT End of Session - 03/24/16 1006    Visit Number 2   Number of Visits 9   Date for PT Re-Evaluation 04/14/16   Authorization Type [employer]   Authorization Time Period 03-15-16 - 05-14-16   PT Start Time 0935   PT Stop Time 1000  pt had to leave early for another appt.   PT Time Calculation (min) 25 min   Activity Tolerance Patient tolerated treatment well   Behavior During Therapy WFL for tasks assessed/performed      Past Medical History:  Diagnosis Date  . Anemia   . CAD (coronary artery disease)   . Colon polyps   . CRI (chronic renal insufficiency)   . DDD (degenerative disc disease)   . Diabetes mellitus    INSULIN DEPENDENT  . GERD (gastroesophageal reflux disease)   . High cholesterol   . Hypertension   . Pancreatitis   . Proteinuria     Past Surgical History:  Procedure Laterality Date  . ABDOMINAL HYSTERECTOMY    . ANGIOPLASTY    . ANKLE SURGERY Right   . BACK SURGERY    . TONSILLECTOMY      There were no vitals filed for this visit.      Subjective Assessment - 03/24/16 0938    Subjective pt has a bike that she uses most days; counts a 100 steps.   Pertinent History 2 seizures on 06-11-16 due to DKA   Patient Stated Goals improve balance and be able to safely walk without a device    Currently in Pain? Yes   Pain Score 4    Pain Location Foot   Pain Orientation Right   Pain Descriptors / Indicators Constant   Pain Type Chronic pain   Pain Onset More than a month ago   Pain Frequency Constant   Aggravating Factors  gout- ate a hamburger   Multiple Pain Sites Yes   Pain Score 5   Pain Location  Knee   Pain Orientation Right;Left   Pain Descriptors / Indicators Aching   Pain Type Chronic pain   Pain Onset More than a month ago   Pain Frequency Constant   Aggravating Factors  arthritis- walking sitting for a long time   Pain Relieving Factors cream, heating pad   Effect of Pain on Daily Activities rests as needed                              Balance Exercises - 03/24/16 0946      OTAGO PROGRAM   Head Movements Standing;5 reps   Neck Movements Standing;5 reps   Back Extension Standing;5 reps   Trunk Movements Standing;5 reps   Ankle Movements Sitting;10 reps           PT Education - 03/24/16 1003    Education provided Yes   Education Details Initiated Ortago HEP started with the "warmup" exercises.  Explained benefits for cardiovascular exercises and progressing activity on bike.   Person(s) Educated Patient   Methods Explanation;Demonstration;Verbal cues;Handout   Comprehension Verbalized understanding;Returned demonstration;Verbal cues required;Need further instruction          PT Short  Term Goals - 03/16/16 1133      PT SHORT TERM GOAL #1   Title STG's = LTG's           PT Long Term Goals - 03/16/16 1134      PT LONG TERM GOAL #1   Title Pt will increase gait velocity to >/= 2.9 ft/sec without device for incr. gait efficiency.  (04-14-16)   Baseline 2.43 ft/sec without device   Time 4   Period Weeks   Status New     PT LONG TERM GOAL #2   Title Increase TUG score to </= 13.0 secs without device for decreased fall risk.  (04-14-16)   Baseline 15.53 secs without device   Time 4   Period Weeks   Status New     PT LONG TERM GOAL #3   Title Increase strength in bil. LE's so pt able to perform sit to stand x 3 reps from 20" high mat surface without UE support.  (04-14-16)   Time 4   Period Weeks   Status New     PT LONG TERM GOAL #4   Title Independent in HEP for balance and LE strengthening exercises.  (04-14-16)   Time  4   Period Weeks   Status New               Plan - 03/24/16 1007    Clinical Impression Statement Pt has a bike at home and encouraged pt to progress time on bike as able. Initiated Ortago exercises which were challenging esp in standing position.  Had to leave early due to appointment re: her seizures.   Rehab Potential Good   PT Frequency 2x / week   PT Duration 4 weeks   PT Treatment/Interventions ADLs/Self Care Home Management;DME Instruction;Gait training;Stair training;Therapeutic activities;Therapeutic exercise;Balance training;Neuromuscular re-education;Patient/family education   PT Next Visit Plan Progress/review HEP for balance exercises and LE strengthening   PT Home Exercise Plan see above   Consulted and Agree with Plan of Care Patient      Patient will benefit from skilled therapeutic intervention in order to improve the following deficits and impairments:  Abnormal gait, Decreased activity tolerance, Decreased balance, Decreased strength, Decreased knowledge of use of DME, Decreased endurance, Decreased coordination  Visit Diagnosis: Other abnormalities of gait and mobility  Muscle weakness (generalized)  Unsteadiness on feet     Problem List Patient Active Problem List   Diagnosis Date Noted  . Convulsions ([place]) 07/24/2015  . Type 2 diabetes mellitus with chronic kidney disease, without long-term current use of insulin ([place]) 07/24/2015  . Leukocytosis   . Chronic kidney disease (CKD), stage IV (severe) ([place]) 06/13/2015  . Seizures ([place]) 06/12/2015  . DKA (diabetic ketoacidoses) ([place]) 06/12/2015  . Seizure ([place]) 06/12/2015  . High cholesterol   . CAD (coronary artery disease)   . Proteinuria   . GERD (gastroesophageal reflux disease)   . DYSLIPIDEMIA 02/27/2010  . OBESITY, MORBID 02/27/2010  . DM 02/24/2010  . RHEUMATIC FEVER 02/24/2010  . Essential hypertension 02/24/2010  . RENAL INSUFFICIENCY, CHRONIC 02/24/2010    Bjorn Loser, PTA   03/24/16, 10:11 AM [facility] [address] [place], [place], [address] Phone: [phone]   Fax:  [phone]  Name: Ursala Cressy MRN: [id number] Date of Birth: 1940-11-20

## 2016-03-26 ENCOUNTER — Ambulatory Visit: Payer: PPO | Admitting: Physical Therapy

## 2016-03-26 DIAGNOSIS — R2689 Other abnormalities of gait and mobility: Secondary | ICD-10-CM

## 2016-03-26 DIAGNOSIS — R2681 Unsteadiness on feet: Secondary | ICD-10-CM

## 2016-03-26 DIAGNOSIS — M6281 Muscle weakness (generalized): Secondary | ICD-10-CM

## 2016-03-26 NOTE — Therapy (Signed)
[facility] [address] [place], [place], [address] Phone: [phone]   Fax:  [phone]  Physical Therapy Treatment  Patient Details  Name: Emily Hughes MRN: [id number] Date of Birth: Apr 15, 1941 Referring Provider: Dr. Jenna Luo  Encounter Date: 03/26/2016      PT End of Session - 03/26/16 0852    Visit Number 3   Number of Visits 9   PT Start Time 0800   PT Stop Time 0845   PT Time Calculation (min) 45 min   Activity Tolerance Patient tolerated treatment well;Patient limited by fatigue;Patient limited by pain      Past Medical History:  Diagnosis Date  . Anemia   . CAD (coronary artery disease)   . Colon polyps   . CRI (chronic renal insufficiency)   . DDD (degenerative disc disease)   . Diabetes mellitus    INSULIN DEPENDENT  . GERD (gastroesophageal reflux disease)   . High cholesterol   . Hypertension   . Pancreatitis   . Proteinuria     Past Surgical History:  Procedure Laterality Date  . ABDOMINAL HYSTERECTOMY    . ANGIOPLASTY    . ANKLE SURGERY Right   . BACK SURGERY    . TONSILLECTOMY      There were no vitals filed for this visit.      Subjective Assessment - 03/26/16 0811    Subjective pt using bike 1-2 x day; does 100 revolutions;  stated she can't lie flat due to lower back pain;     Currently in Pain? Yes   Pain Score 2    Pain Location Foot   Pain Orientation Right   Pain Descriptors / Indicators Constant   Pain Type Chronic pain   Pain Onset More than a month ago   Aggravating Factors  gout;    Multiple Pain Sites Yes   Pain Score 1   Pain Location Knee   Pain Orientation Right;Left   Pain Descriptors / Indicators Aching   Pain Type Chronic pain   Pain Onset More than a month ago   Pain Frequency Intermittent   Pain Relieving Factors LAQ                                 PT Education - 03/26/16 0949    Education provided Yes           PT Short Term Goals - 03/16/16 1133      PT SHORT TERM GOAL #1   Title STG's = LTG's           PT Long Term Goals - 03/16/16 1134      PT LONG TERM GOAL #1   Title Pt will increase gait velocity to >/= 2.9 ft/sec without device for incr. gait efficiency.  (04-14-16)   Baseline 2.43 ft/sec without device   Time 4   Period Weeks   Status New     PT LONG TERM GOAL #2   Title Increase TUG score to </= 13.0 secs without device for decreased fall risk.  (04-14-16)   Baseline 15.53 secs without device   Time 4   Period Weeks   Status New     PT LONG TERM GOAL #3   Title Increase strength in bil. LE's so pt able to perform sit to stand x 3 reps from 20" high mat surface without UE support.  (04-14-16)   Time 4  Period Weeks   Status New     PT LONG TERM GOAL #4   Title Independent in HEP for balance and LE strengthening exercises.  (04-14-16)   Time 4   Period Weeks   Status New       Therapeutic exercise;  Amb 345' in 2:30 with rollator;  Limited by fatigue and a little knee pain; 2.3 ft/sec Educated on goal to get to 15 min w/o knee pain or excessive pain;   Sit to stand x 5 w/o UE support  Seated LAQ  X 20 each leg  2 bouts  Stepping and reaching out side base of support  At punching bag had her step and reach out and slap the punching bag          Plan - 03/26/16 0951    Clinical Impression Statement Pt has a sendentary lifestyle ; need to cont to encourage her to see that therex and activity are tool to keep her independent and able to do the things she likes;  told her that her ex's should be the hardest thing she does so everything else she does is sub maximal effort and simple activites such as going to the store are not so taxing;   she was able to tolerate more than she did today; she was not short of breath or showing any s/s of excessive fatigue; but she wanted to stop; she did c/o some knee pain;  she was able to do the step refelx ex's w/o  loss of balance      Patient will benefit from skilled therapeutic intervention in order to improve the following deficits and impairments:     Visit Diagnosis: Other abnormalities of gait and mobility  Muscle weakness (generalized)  Unsteadiness on feet     Problem List Patient Active Problem List   Diagnosis Date Noted  . Convulsions ([place]) 07/24/2015  . Type 2 diabetes mellitus with chronic kidney disease, without long-term current use of insulin ([place]) 07/24/2015  . Leukocytosis   . Chronic kidney disease (CKD), stage IV (severe) ([place]) 06/13/2015  . Seizures ([place]) 06/12/2015  . DKA (diabetic ketoacidoses) ([place]) 06/12/2015  . Seizure ([place]) 06/12/2015  . High cholesterol   . CAD (coronary artery disease)   . Proteinuria   . GERD (gastroesophageal reflux disease)   . DYSLIPIDEMIA 02/27/2010  . OBESITY, MORBID 02/27/2010  . DM 02/24/2010  . RHEUMATIC FEVER 02/24/2010  . Essential hypertension 02/24/2010  . RENAL INSUFFICIENCY, CHRONIC 02/24/2010    Rosaura Carpenter D PT DPT 03/26/2016, 9:59 AM  [facility] [address] [place], [place], [address] Phone: [phone]   Fax:  [phone]  Name: Brandyce Dimario MRN: [id number] Date of Birth: 09/27/1940

## 2016-03-29 ENCOUNTER — Ambulatory Visit: Payer: PPO | Admitting: Physical Therapy

## 2016-03-30 ENCOUNTER — Ambulatory Visit (INDEPENDENT_AMBULATORY_CARE_PROVIDER_SITE_OTHER): Payer: PPO

## 2016-03-30 DIAGNOSIS — Z23 Encounter for immunization: Secondary | ICD-10-CM | POA: Diagnosis not present

## 2016-04-01 ENCOUNTER — Encounter: Payer: Self-pay | Admitting: Physical Therapy

## 2016-04-01 ENCOUNTER — Ambulatory Visit: Payer: PPO | Admitting: Physical Therapy

## 2016-04-01 DIAGNOSIS — R2689 Other abnormalities of gait and mobility: Secondary | ICD-10-CM

## 2016-04-01 DIAGNOSIS — M6281 Muscle weakness (generalized): Secondary | ICD-10-CM

## 2016-04-01 NOTE — Therapy (Signed)
[facility] [address] [place], [place], [address] Phone: [phone]   Fax:  [phone]  Physical Therapy Treatment  Patient Details  Name: Emily Hughes MRN: [id number] Date of Birth: Jun 22, 1941 Referring Provider: Dr. Jenna Luo  Encounter Date: 04/01/2016      PT End of Session - 04/01/16 1547    Visit Number 4   Number of Visits 9   Date for PT Re-Evaluation 04/14/16   Authorization Type [employer]   Authorization Time Period 03-15-16 - 05-14-16   PT Start Time 0802   PT Stop Time 0843   PT Time Calculation (min) 41 min      Past Medical History:  Diagnosis Date  . Anemia   . CAD (coronary artery disease)   . Colon polyps   . CRI (chronic renal insufficiency)   . DDD (degenerative disc disease)   . Diabetes mellitus    INSULIN DEPENDENT  . GERD (gastroesophageal reflux disease)   . High cholesterol   . Hypertension   . Pancreatitis   . Proteinuria     Past Surgical History:  Procedure Laterality Date  . ABDOMINAL HYSTERECTOMY    . ANGIOPLASTY    . ANKLE SURGERY Right   . BACK SURGERY    . TONSILLECTOMY      There were no vitals filed for this visit.      Subjective Assessment - 04/01/16 1543    Subjective Pt reports she has been doing exercises at home; says her knees are feeling better - is planning on going to [facility] next week   Pertinent History 2 seizures on 06-11-16 due to DKA   Patient Stated Goals improve balance and be able to safely walk without a device    Currently in Pain? No/denies                         [facility] Adult PT Treatment/Exercise - 04/01/16 0806      Transfers   Transfers Sit to Stand   Number of Reps 1 set  5 reps - no UE support     Knee/Hip Exercises: Aerobic   Recumbent Bike Scifit level 1.6 x 5" with UE's and LE's     Knee/Hip Exercises: Standing   Heel Raises Both;1 set;10 reps   Hip Flexion AROM;Both;1 set;10 reps   marching in standing - with UE support prn   Forward Step Up Left;1 set;10 reps;Step Height: 6"  with both hand rails      Knee/Hip Exercises: Seated   Long Arc Quad Both;1 set;10 reps  3# weight used on each leg      TherEx: Heel raises x 10 reps -- 2 sec hold Ankle sways by counter - 10 reps Maching in place - 10 reps each Touching opposite elbow to knee - 10 reps each side  LAQ's - 3# weight on RLE - 10 reps;  LLE 3# 10 reps with 5 sec hold   Attempted leg press - pt declined due to having to lift legs high to get positioned on machine  Neuro Re-ed; pt performed stepping over and back of orange hurdle - R knee instability noted with this activity - so it was discontinued With RLE and 10 reps performed with LLE      PT Short Term Goals - 03/16/16 1133      PT SHORT TERM GOAL #1   Title STG's = LTG's  PT Long Term Goals - 03/16/16 1134      PT LONG TERM GOAL #1   Title Pt will increase gait velocity to >/= 2.9 ft/sec without device for incr. gait efficiency.  (04-14-16)   Baseline 2.43 ft/sec without device   Time 4   Period Weeks   Status New     PT LONG TERM GOAL #2   Title Increase TUG score to </= 13.0 secs without device for decreased fall risk.  (04-14-16)   Baseline 15.53 secs without device   Time 4   Period Weeks   Status New     PT LONG TERM GOAL #3   Title Increase strength in bil. LE's so pt able to perform sit to stand x 3 reps from 20" high mat surface without UE support.  (04-14-16)   Time 4   Period Weeks   Status New     PT LONG TERM GOAL #4   Title Independent in HEP for balance and LE strengthening exercises.  (04-14-16)   Time 4   Period Weeks   Status New               Plan - 04/01/16 1548    Clinical Impression Statement Pt had occurrence of R knee "catching" with R SLS activity - needed mod UE support on counter due to c/o pain and to be able to recover LOB with this occurrence; pt reports this is due to OA -  pt's gait would be safer with use of RW - pt states she has a rollator but does not use it very much   Rehab Potential Good   PT Frequency 2x / week   PT Duration 4 weeks   PT Treatment/Interventions ADLs/Self Care Home Management;DME Instruction;Gait training;Stair training;Therapeutic activities;Therapeutic exercise;Balance training;Neuromuscular re-education;Patient/family education   PT Next Visit Plan Progress/review HEP for balance exercises and LE strengthening - pt wishes to cancel next week and begin going to [facility] - will keep appts in 2 weeks   PT Home Exercise Plan see above   Consulted and Agree with Plan of Care Patient      Patient will benefit from skilled therapeutic intervention in order to improve the following deficits and impairments:  Abnormal gait, Decreased activity tolerance, Decreased balance, Decreased strength, Decreased knowledge of use of DME, Decreased endurance, Decreased coordination  Visit Diagnosis: Other abnormalities of gait and mobility  Muscle weakness (generalized)     Problem List Patient Active Problem List   Diagnosis Date Noted  . Convulsions ([place]) 07/24/2015  . Type 2 diabetes mellitus with chronic kidney disease, without long-term current use of insulin ([place]) 07/24/2015  . Leukocytosis   . Chronic kidney disease (CKD), stage IV (severe) ([place]) 06/13/2015  . Seizures ([place]) 06/12/2015  . DKA (diabetic ketoacidoses) ([place]) 06/12/2015  . Seizure ([place]) 06/12/2015  . High cholesterol   . CAD (coronary artery disease)   . Proteinuria   . GERD (gastroesophageal reflux disease)   . DYSLIPIDEMIA 02/27/2010  . OBESITY, MORBID 02/27/2010  . DM 02/24/2010  . RHEUMATIC FEVER 02/24/2010  . Essential hypertension 02/24/2010  . RENAL INSUFFICIENCY, CHRONIC 02/24/2010    Alda Lea, PT 04/01/2016, 3:53 PM  [facility] [address] [place], [place], [address] Phone:  [phone]   Fax:  [phone]  Name: Emily Hughes MRN: [id number] Date of Birth: Dec 20, 1940

## 2016-04-05 ENCOUNTER — Ambulatory Visit: Payer: PPO | Admitting: Physical Therapy

## 2016-04-08 ENCOUNTER — Ambulatory Visit: Payer: PPO | Admitting: Physical Therapy

## 2016-04-12 ENCOUNTER — Ambulatory Visit: Payer: PPO | Attending: Family Medicine | Admitting: Physical Therapy

## 2016-04-12 ENCOUNTER — Encounter: Payer: Self-pay | Admitting: Physical Therapy

## 2016-04-12 DIAGNOSIS — R2689 Other abnormalities of gait and mobility: Secondary | ICD-10-CM | POA: Diagnosis not present

## 2016-04-12 DIAGNOSIS — M6281 Muscle weakness (generalized): Secondary | ICD-10-CM | POA: Diagnosis not present

## 2016-04-12 NOTE — Patient Instructions (Signed)
Knee Extension: Resisted (Sitting)    With band looped around right ankle and under other foot, straighten leg with ankle loop. Keep other leg bent to increase resistance. Repeat __10__ times per set. Do _1_ sets per session. Do __1__ sessions per day.  http://orth.exer.us/691   Copyright  VHI. All rights reserved.  Strengthening: Hip Extension - Resisted    With tubing around right ankle, face anchor and pull leg straight back. Repeat _10_ times per set. Do _1_ sets per session. Do __1__ sessions per day.  http://orth.exer.us/637   Copyright  VHI. All rights reserved.  Strengthening: Hip Abduction - Resisted    With tubing around right leg, other side toward anchor, extend leg out from side. Repeat __10_ times per set. Do _1__ sets per session. Do _1__ sessions per day.  http://orth.exer.us/635   Copyright  VHI. All rights reserved.  Strengthening: Hip Flexion - Resisted    With tubing around left ankle, anchor behind, bring leg forward, keeping knee straight. Repeat _10_ times per set. Do _1_ sets per session. Do _1__ sessions per day.  http://orth.exer.us/639   Copyright  VHI. All rights reserved.

## 2016-04-12 NOTE — Therapy (Signed)
[facility] [address] [place], [place], [address] Phone: [phone]   Fax:  [phone]  Physical Therapy Treatment  Patient Details  Name: Emily Hughes MRN: [id number] Date of Birth: 04-27-1941 Referring Provider: Dr. Jenna Luo  Encounter Date: 04/12/2016      PT End of Session - 04/12/16 1413    Visit Number 5   Number of Visits 9   Date for PT Re-Evaluation 04/14/16   Authorization Type [employer]   PT Start Time 0802   PT Stop Time 5364   PT Time Calculation (min) 42 min      Past Medical History:  Diagnosis Date  . Anemia   . CAD (coronary artery disease)   . Colon polyps   . CRI (chronic renal insufficiency)   . DDD (degenerative disc disease)   . Diabetes mellitus    INSULIN DEPENDENT  . GERD (gastroesophageal reflux disease)   . High cholesterol   . Hypertension   . Pancreatitis   . Proteinuria     Past Surgical History:  Procedure Laterality Date  . ABDOMINAL HYSTERECTOMY    . ANGIOPLASTY    . ANKLE SURGERY Right   . BACK SURGERY    . TONSILLECTOMY      There were no vitals filed for this visit.      Subjective Assessment - 04/12/16 1406    Subjective Pt reports today is a bad day - states "I don't feel well"; pt reports she did start going to [facility] last week - is doing Silver Sneakers   Pertinent History 2 seizures on 06-11-16 due to DKA   Patient Stated Goals improve balance and be able to safely walk without a device    Currently in Pain? No/denies                         [facility] Adult PT Treatment/Exercise - 04/12/16 0812      Transfers   Transfers Sit to Stand   Sit to Stand 7: Independent   Number of Reps Other reps (comment)  3   Comments no UE support needed     Ambulation/Gait   Ambulation/Gait Yes   Ambulation/Gait Assistance 6: Modified independent (Device/Increase time)   Ambulation Distance (Feet) 120 Feet   Assistive device  None   Gait Pattern Step-through pattern   Ambulation Surface Level;Indoor   Gait velocity 11.03 secs = 2.97 ft/sec     Knee/Hip Exercises: Standing   Heel Raises Both;1 set;10 reps   Hip Flexion Stengthening;Right;Left;Other (comment)  3 reps RLE and LLE due to pt's request (with red t-band)   Forward Step Up Both;1 set;10 reps;Step Height: 6"     Knee/Hip Exercises: Seated   Long Arc Quad Strengthening;Both;1 set  with red theraband     Neuro Re-ed; TUG score 12.09 secs with no device;  Reviewed HEP for balance including kicks (forward, back and side) and Marching in place; sidestepping - pt verbalizes understanding Pt performed tap ups to 1st step - with hand rail as needed - 10 reps each leg  TherEx; pt performed hip extension and hip abduction in standing with red theraband 3 reps each leg (3 reps only per pt's request)        PT Education - 04/12/16 1412    Education provided Yes   Education Details added hip flexion, ext, abdct and LAQ to HEP with red theraband   Person(s) Educated Patient   Methods Explanation;Demonstration;Handout  Comprehension Verbalized understanding;Returned demonstration          PT Short Term Goals - 03/16/16 1133      PT SHORT TERM GOAL #1   Title STG's = LTG's           PT Long Term Goals - 2016-04-25 1407      PT LONG TERM GOAL #1   Title Pt will increase gait velocity to >/= 2.9 ft/sec without device for incr. gait efficiency.  (04-14-16)   Baseline 11.03 = 2.97 ft/sec without device   Status Achieved     PT LONG TERM GOAL #2   Title Increase TUG score to </= 13.0 secs without device for decreased fall risk.  (04-14-16)   Baseline 14.13 secs, 12.09 2nd trial with no device on 04/25/2016   Status Achieved     PT LONG TERM GOAL #3   Title Increase strength in bil. LE's so pt able to perform sit to stand x 3 reps from 20" high mat surface without UE support.  (04-14-16)   Baseline met 04/25/16   Status Achieved     PT LONG  TERM GOAL #4   Title Independent in HEP for balance and LE strengthening exercises.  (04-14-16)   Baseline met 2016/04/25   Status Achieved               Plan - 25-Apr-2016 1414    Clinical Impression Statement Pt met 4/4 LTG's - pt reports she is pleased with progress and is going to [facility] for [employer]; states she is ready to discharge from PT and transition to [facility]   Rehab Potential Good   PT Frequency 2x / week   PT Duration 4 weeks   PT Treatment/Interventions ADLs/Self Care Home Management;DME Instruction;Gait training;Stair training;Therapeutic activities;Therapeutic exercise;Balance training;Neuromuscular re-education;Patient/family education   PT Next Visit Plan D/C   PT Home Exercise Plan see above   Consulted and Agree with Plan of Care Patient      Patient will benefit from skilled therapeutic intervention in order to improve the following deficits and impairments:  Abnormal gait, Decreased activity tolerance, Decreased balance, Decreased strength, Decreased knowledge of use of DME, Decreased endurance, Decreased coordination  Visit Diagnosis: Other abnormalities of gait and mobility  Muscle weakness (generalized)       G-Codes - 2016-04-25 1417    Functional Assessment Tool Used TUG score 12.09 secs:  gait velocity 2.97 ft/sec with no device   Functional Limitation Mobility: Walking and moving around   Mobility: Walking and Moving Around Goal Status [phone]) At least 20 percent but less than 40 percent impaired, limited or restricted   Mobility: Walking and Moving Around Discharge Status [phone]) At least 20 percent but less than 40 percent impaired, limited or restricted      Problem List Patient Active Problem List   Diagnosis Date Noted  . Convulsions ([place]) 07/24/2015  . Type 2 diabetes mellitus with chronic kidney disease, without long-term current use of insulin ([place]) 07/24/2015  . Leukocytosis   . Chronic kidney disease (CKD), stage IV (severe) ([place])  06/13/2015  . Seizures ([place]) 06/12/2015  . DKA (diabetic ketoacidoses) ([place]) 06/12/2015  . Seizure ([place]) 06/12/2015  . High cholesterol   . CAD (coronary artery disease)   . Proteinuria   . GERD (gastroesophageal reflux disease)   . DYSLIPIDEMIA 02/27/2010  . OBESITY, MORBID 02/27/2010  . DM 02/24/2010  . RHEUMATIC FEVER 02/24/2010  . Essential hypertension 02/24/2010  . RENAL INSUFFICIENCY, CHRONIC 02/24/2010  PHYSICAL THERAPY DISCHARGE SUMMARY  Visits from Start of Care: 5  Current functional level related to goals / functional outcomes: See above for progress towards goals - pt has met 4/4 LTG's   Remaining deficits: Continued decreased high level standing balance skills Continued LE weakness and decreased activity tolerance Pt using rollator for assistance with prolonged ambulation distances - pt able to amb. Into clinic without assistive device    Education / Equipment: Pt has been instructed in HEP for balance and LE strengthening - was given red theraband for PRE's in HEP Pt has joined [facility] and has started participation in [employer] program Plan: Patient agrees to discharge.  Patient goals were partially met. Patient is being discharged due to meeting the stated rehab goals.  ?????       SVXBLT, JQZES PQZRAQT, PT 04/12/2016, 2:20 PM  [facility] [address] [place], [place], [address] Phone: [phone]   Fax:  [phone]  Name: Dillan Lunden MRN: [id number] Date of Birth: 1940-09-04

## 2016-04-15 ENCOUNTER — Ambulatory Visit: Payer: PPO | Admitting: Physical Therapy

## 2016-04-19 ENCOUNTER — Ambulatory Visit: Payer: PPO | Admitting: Physical Therapy

## 2016-04-22 ENCOUNTER — Ambulatory Visit: Payer: PPO | Admitting: Physical Therapy

## 2016-05-06 ENCOUNTER — Ambulatory Visit (INDEPENDENT_AMBULATORY_CARE_PROVIDER_SITE_OTHER): Payer: PPO | Admitting: Family Medicine

## 2016-05-06 ENCOUNTER — Encounter: Payer: Self-pay | Admitting: Family Medicine

## 2016-05-06 VITALS — BP 128/74 | HR 80 | Temp 98.1°F | Resp 18 | Ht 63.0 in | Wt 248.0 lb

## 2016-05-06 DIAGNOSIS — E1122 Type 2 diabetes mellitus with diabetic chronic kidney disease: Secondary | ICD-10-CM | POA: Diagnosis not present

## 2016-05-06 DIAGNOSIS — J019 Acute sinusitis, unspecified: Secondary | ICD-10-CM

## 2016-05-06 DIAGNOSIS — N186 End stage renal disease: Secondary | ICD-10-CM

## 2016-05-06 DIAGNOSIS — E119 Type 2 diabetes mellitus without complications: Secondary | ICD-10-CM | POA: Diagnosis not present

## 2016-05-06 DIAGNOSIS — Z794 Long term (current) use of insulin: Secondary | ICD-10-CM

## 2016-05-06 MED ORDER — AMOXICILLIN-POT CLAVULANATE 875-125 MG PO TABS: 1.0000 | ORAL_TABLET | Freq: Two times a day (BID) | ORAL | 0 refills | Status: DC

## 2016-05-06 NOTE — Progress Notes (Signed)
Subjective:    Patient ID: Emily Hughes, female    DOB: 1941/01/27, 75 y.o.   MRN: [id number]  HPI Patient presents today with sinus pain and pressure for 1 month. The pain is located behind both eyes and in the maxillary sinuses bilaterally. She reports a dull headache on a daily basis. She allowed 3-4 weeks to see if the symptoms would improve but it has gradually gotten worse. She also reports postnasal drip, head congestion, and a sore throat secondary to drainage. In addition, she states that the Levemir is no longer controlling her sugars. She is currently on Levemir 38 units subcutaneous every morning and 10 units of NovoLog with lunch. She states that her fasting blood sugars are usually good in less than 130. However, she states that her two-hour postprandial sugars can become very high. However she is unable to provide me any numbers to review. She denies any hypoglycemia. Past Medical History:  Diagnosis Date  . Anemia   . CAD (coronary artery disease)   . Colon polyps   . CRI (chronic renal insufficiency)   . DDD (degenerative disc disease)   . Diabetes mellitus    INSULIN DEPENDENT  . GERD (gastroesophageal reflux disease)   . High cholesterol   . Hypertension   . Pancreatitis   . Proteinuria    Past Surgical History:  Procedure Laterality Date  . ABDOMINAL HYSTERECTOMY    . ANGIOPLASTY    . ANKLE SURGERY Right   . BACK SURGERY    . TONSILLECTOMY     Current Outpatient Prescriptions on File Prior to Visit  Medication Sig Dispense Refill  . allopurinol (ZYLOPRIM) 300 MG tablet TAKE 1 TABLET ONCE DAILY. 90 tablet 2  . aspirin 81 MG tablet Take 81 mg by mouth daily.      . calcitRIOL (ROCALTROL) 0.25 MCG capsule Take 0.25 mcg by mouth daily.    . cetirizine (ZYRTEC) 10 MG tablet Take 1 tablet (10 mg total) by mouth daily. 30 tablet 11  . cloNIDine (CATAPRES) 0.1 MG tablet Take 1 tablet (0.1 mg total) by mouth 2 (two) times daily. 180 tablet 3  . colchicine 0.6 MG  tablet Take two tablets by mouth now then repeat one tablet in 1 hour 3 tablet 0  . diltiazem (CARDIZEM CD) 300 MG 24 hr capsule Take 1 capsule (300 mg total) by mouth daily. 90 capsule 1  . divalproex (DEPAKOTE ER) 500 MG 24 hr tablet Take 1 tablet at bedtime 90 tablet 3  . ferrous fumarate (FERRO-SEQUELS) 50 MG CR tablet Take 50 mg by mouth daily.      . furosemide (LASIX) 40 MG tablet TAKE 2 TABLETS IN THE MORNING AND 1 TABLET IN THE EVENING. 270 tablet 1  . glimepiride (AMARYL) 4 MG tablet TAKE 1 TABLET ONCE DAILY. 30 tablet 3  . insulin aspart (NOVOLOG) 100 UNIT/ML FlexPen Inject 10 Units into the skin daily with lunch. (Patient taking differently: Inject 10 Units into the skin. Sliding Scale) 15 mL 3  . Insulin Detemir (LEVEMIR) 100 UNIT/ML Pen Inject 38 Units into the skin daily. 45 pen 1  . levothyroxine (SYNTHROID, LEVOTHROID) 50 MCG tablet Take 1 tablet (50 mcg total) by mouth daily. 90 tablet 1  . metoprolol succinate (TOPROL-XL) 25 MG 24 hr tablet TAKE (1) TABLET DAILY. 90 tablet 1  . mometasone (ELOCON) 0.1 % ointment APPLY TOPICALLY DAILY. 45 g 3  . pravastatin (PRAVACHOL) 80 MG tablet TAKE ONE TABLET AT BEDTIME. 90 tablet 1  .  sodium bicarbonate 650 MG tablet Take 1 tablet (650 mg total) by mouth 4 (four) times daily. 360 tablet 1  . sucralfate (CARAFATE) 1 G tablet Take 1 tablet (1 g total) by mouth 4 (four) times daily -  with meals and at bedtime. 120 tablet 0  . UNABLE TO FIND Takes a potassium suspension daily     No current facility-administered medications on file prior to visit.    Allergies  Allergen Reactions  . Ace Inhibitors   . Actos [Pioglitazone Hydrochloride]   . Dust Mite Extract   . Dye Fdc Red [Erythrosine Red No. 3]    Social History   Social History  . Marital status: Widowed    Spouse name: N/A  . Number of children: N/A  . Years of education: N/A   Occupational History  . Retired    Social History Main Topics  . Smoking status: Former Research scientist (life sciences)    . Smokeless tobacco: Never Used     Comment: quit smoking in 2000  . Alcohol use No  . Drug use: No  . Sexual activity: Not on file   Other Topics Concern  . Not on file   Social History Narrative  . No narrative on file      Review of Systems  All other systems reviewed and are negative.      Objective:   Physical Exam  HENT:  Nose: Mucosal edema and rhinorrhea present. Right sinus exhibits maxillary sinus tenderness and frontal sinus tenderness. Left sinus exhibits maxillary sinus tenderness and frontal sinus tenderness.  Cardiovascular: Normal rate, regular rhythm and normal heart sounds.   Pulmonary/Chest: Effort normal and breath sounds normal. No respiratory distress. She has no wheezes. She has no rales.  Abdominal: Soft. Bowel sounds are normal.  Vitals reviewed.         Assessment & Plan:  Acute rhinosinusitis - Plan: amoxicillin-clavulanate (AUGMENTIN) 875-125 MG tablet  Diabetes mellitus, type II, insulin dependent (H[facility])  Type II diabetes mellitus with end-stage renal disease ([place])  Patient has a sinus infection. Begin Augmentin 875 mg by mouth twice a day for 10 days. Recheck in 2 weeks if no better. Discontinue Levemir and replaced with basaglar 38 units daily. Continue NovoLog 10 units with lunch. Recheck fasting blood sugars and two-hour postprandial sugars in one week. Hopefully this and so will be more affordable and she also did better previously on Lantus. Goal fasting blood sugars are less than 130 goal two-hour postprandial sugars are less than 160. She will bring in sugar values for me to review in one week and titrate insulin further at that point. I would also like her to return fasting for a CMP, fasting lipid panel, and hemoglobin A1c

## 2016-05-14 DIAGNOSIS — N184 Chronic kidney disease, stage 4 (severe): Secondary | ICD-10-CM | POA: Diagnosis not present

## 2016-05-14 DIAGNOSIS — Z6841 Body Mass Index (BMI) 40.0 and over, adult: Secondary | ICD-10-CM | POA: Diagnosis not present

## 2016-05-14 DIAGNOSIS — I129 Hypertensive chronic kidney disease with stage 1 through stage 4 chronic kidney disease, or unspecified chronic kidney disease: Secondary | ICD-10-CM | POA: Diagnosis not present

## 2016-05-14 DIAGNOSIS — N189 Chronic kidney disease, unspecified: Secondary | ICD-10-CM | POA: Diagnosis not present

## 2016-05-14 DIAGNOSIS — M109 Gout, unspecified: Secondary | ICD-10-CM | POA: Diagnosis not present

## 2016-05-14 DIAGNOSIS — N2581 Secondary hyperparathyroidism of renal origin: Secondary | ICD-10-CM | POA: Diagnosis not present

## 2016-05-14 DIAGNOSIS — D631 Anemia in chronic kidney disease: Secondary | ICD-10-CM | POA: Diagnosis not present

## 2016-05-14 DIAGNOSIS — R809 Proteinuria, unspecified: Secondary | ICD-10-CM | POA: Diagnosis not present

## 2016-05-14 DIAGNOSIS — E1129 Type 2 diabetes mellitus with other diabetic kidney complication: Secondary | ICD-10-CM | POA: Diagnosis not present

## 2016-05-21 ENCOUNTER — Telehealth: Payer: Self-pay | Admitting: Family Medicine

## 2016-05-21 MED ORDER — BASAGLAR KWIKPEN 100 UNIT/ML ~~LOC~~ SOPN
38.0000 [IU] | PEN_INJECTOR | Freq: Every day | SUBCUTANEOUS | 3 refills | Status: DC
Start: 2016-05-21 — End: 2016-05-21

## 2016-05-21 NOTE — Telephone Encounter (Signed)
Patient got a sample of basaglar today and would like a prescription of that called into [facility] if possible

## 2016-05-21 NOTE — Telephone Encounter (Signed)
Medication called/sent to requested pharmacy  

## 2016-05-21 NOTE — Addendum Note (Signed)
Addended by: Shary Decamp B on: 05/21/2016 04:00 PM   Modules accepted: Orders

## 2016-05-21 NOTE — Telephone Encounter (Addendum)
Received call from pharmacy and pt's insurance will not cover basaglar. Switched to lantus pen.

## 2016-06-15 ENCOUNTER — Other Ambulatory Visit: Payer: Self-pay | Admitting: Family Medicine

## 2016-06-15 MED ORDER — LEVOTHYROXINE SODIUM 50 MCG PO TABS: 50.0000 ug | ORAL_TABLET | Freq: Every day | ORAL | 3 refills | Status: DC

## 2016-06-15 MED ORDER — PRAVASTATIN SODIUM 80 MG PO TABS: ORAL_TABLET | ORAL | 3 refills | Status: DC

## 2016-06-15 MED ORDER — DILTIAZEM HCL ER COATED BEADS 300 MG PO CP24
300.0000 mg | ORAL_CAPSULE | Freq: Every day | ORAL | 3 refills | Status: DC
Start: 2016-06-15 — End: 2017-05-04

## 2016-06-15 MED ORDER — METOPROLOL SUCCINATE ER 25 MG PO TB24: ORAL_TABLET | ORAL | 3 refills | Status: DC

## 2016-06-15 NOTE — Telephone Encounter (Signed)
Medication called/sent to requested pharmacy  

## 2016-06-17 ENCOUNTER — Other Ambulatory Visit: Payer: Self-pay | Admitting: Family Medicine

## 2016-07-12 ENCOUNTER — Telehealth: Payer: Self-pay | Admitting: Neurology

## 2016-07-12 ENCOUNTER — Other Ambulatory Visit: Payer: Self-pay

## 2016-07-12 DIAGNOSIS — R569 Unspecified convulsions: Secondary | ICD-10-CM

## 2016-07-12 MED ORDER — DIVALPROEX SODIUM ER 500 MG PO TB24: ORAL_TABLET | ORAL | 0 refills | Status: DC

## 2016-07-12 NOTE — Telephone Encounter (Signed)
Sheena from [facility] left VM patient needed Depakote 1 month supply sent to [facility] because she has not received her supply from mail order. RX 30 day supply sent.

## 2016-07-12 NOTE — Telephone Encounter (Signed)
[employer] left a message regarding a prescription for PT and would like a call back at [phone]

## 2016-07-22 ENCOUNTER — Ambulatory Visit: Payer: PPO | Admitting: Family Medicine

## 2016-07-26 ENCOUNTER — Other Ambulatory Visit: Payer: Self-pay | Admitting: Family Medicine

## 2016-07-27 ENCOUNTER — Encounter: Payer: Self-pay | Admitting: Family Medicine

## 2016-07-27 ENCOUNTER — Telehealth: Payer: Self-pay | Admitting: Family Medicine

## 2016-07-27 ENCOUNTER — Ambulatory Visit (INDEPENDENT_AMBULATORY_CARE_PROVIDER_SITE_OTHER): Payer: PPO | Admitting: Family Medicine

## 2016-07-27 VITALS — BP 130/70 | HR 70 | Temp 98.8°F | Resp 18 | Ht 63.0 in | Wt 243.0 lb

## 2016-07-27 DIAGNOSIS — M25562 Pain in left knee: Secondary | ICD-10-CM

## 2016-07-27 DIAGNOSIS — N760 Acute vaginitis: Secondary | ICD-10-CM

## 2016-07-27 MED ORDER — DICLOFENAC SODIUM 1 % TD GEL: 2.0000 g | Freq: Four times a day (QID) | TRANSDERMAL | 0 refills | Status: DC

## 2016-07-27 MED ORDER — METRONIDAZOLE 500 MG PO TABS: 500.0000 mg | ORAL_TABLET | Freq: Two times a day (BID) | ORAL | 0 refills | Status: DC

## 2016-07-27 MED ORDER — COLCHICINE 0.6 MG PO TABS: ORAL_TABLET | ORAL | 0 refills | Status: DC

## 2016-07-27 NOTE — Telephone Encounter (Signed)
Pt states doctor pickard was suppose to call in a cream for her today after her appointment and it has not been called in yet. She has not got the cream before and she does not know the name of it, it is for knee arthritis.

## 2016-07-27 NOTE — Progress Notes (Signed)
Subjective:    Patient ID: Emily Hughes, female    DOB: 02-13-1941, 76 y.o.   MRN: [id number]  HPI Patient reports several weeks of gradually worsening left knee pain. Pain is located over the anterior joint line both medially and laterally. It is worse with prolonged standing and walking. She cannot take oral NSAIDs due to her history of stage IV chronic kidney disease. She also reports a foul smelling vaginal discharge that smells "rotten". She denies any color. The discharge is slight. However the smell in her vagina is "rotten". No matter how much she bathe she cannot rid herself of the smell Past Medical History:  Diagnosis Date  . Anemia   . CAD (coronary artery disease)   . Colon polyps   . CRI (chronic renal insufficiency)   . DDD (degenerative disc disease)   . Diabetes mellitus    INSULIN DEPENDENT  . GERD (gastroesophageal reflux disease)   . High cholesterol   . Hypertension   . Pancreatitis   . Proteinuria    Past Surgical History:  Procedure Laterality Date  . ABDOMINAL HYSTERECTOMY    . ANGIOPLASTY    . ANKLE SURGERY Right   . BACK SURGERY    . TONSILLECTOMY     Current Outpatient Prescriptions on File Prior to Visit  Medication Sig Dispense Refill  . allopurinol (ZYLOPRIM) 300 MG tablet TAKE 1 TABLET ONCE DAILY. 90 tablet 2  . aspirin 81 MG tablet Take 81 mg by mouth daily.      . calcitRIOL (ROCALTROL) 0.25 MCG capsule Take 0.25 mcg by mouth daily.    . cetirizine (ZYRTEC) 10 MG tablet Take 1 tablet (10 mg total) by mouth daily. 30 tablet 11  . cloNIDine (CATAPRES) 0.1 MG tablet Take 1 tablet (0.1 mg total) by mouth 2 (two) times daily. 180 tablet 3  . diltiazem (CARDIZEM CD) 300 MG 24 hr capsule Take 1 capsule (300 mg total) by mouth daily. 90 capsule 3  . divalproex (DEPAKOTE ER) 500 MG 24 hr tablet Take 1 tablet at bedtime 30 tablet 0  . ferrous fumarate (FERRO-SEQUELS) 50 MG CR tablet Take 50 mg by mouth daily.      . furosemide (LASIX) 40 MG tablet  TAKE 2 TABLETS IN THE MORNING AND 1 TABLET IN THE EVENING. 270 tablet 1  . glimepiride (AMARYL) 4 MG tablet TAKE 1 TABLET ONCE DAILY. 30 tablet 0  . insulin aspart (NOVOLOG) 100 UNIT/ML FlexPen Inject 10 Units into the skin daily with lunch. (Patient taking differently: Inject 10 Units into the skin. Sliding Scale) 15 mL 3  . Insulin Glargine (LANTUS SOLOSTAR) 100 UNIT/ML Solostar Pen Inject 38 Units into the skin daily at 10 pm.    . levothyroxine (SYNTHROID, LEVOTHROID) 50 MCG tablet Take 1 tablet (50 mcg total) by mouth daily. 90 tablet 3  . metoprolol succinate (TOPROL-XL) 25 MG 24 hr tablet TAKE (1) TABLET DAILY. 90 tablet 3  . mometasone (ELOCON) 0.1 % ointment APPLY TOPICALLY DAILY. 45 g 3  . omeprazole (PRILOSEC) 40 MG capsule Take 40 mg by mouth daily.     . pravastatin (PRAVACHOL) 80 MG tablet TAKE ONE TABLET AT BEDTIME. 90 tablet 3  . sodium bicarbonate 650 MG tablet Take 1 tablet (650 mg total) by mouth 4 (four) times daily. 360 tablet 1  . sucralfate (CARAFATE) 1 G tablet Take 1 tablet (1 g total) by mouth 4 (four) times daily -  with meals and at bedtime. 120 tablet 0  .  UNABLE TO FIND Takes a potassium suspension daily     No current facility-administered medications on file prior to visit.    Allergies  Allergen Reactions  . Ace Inhibitors   . Actos [Pioglitazone Hydrochloride]   . Dust Mite Extract   . Dye Fdc Red [Erythrosine Red No. 3]    Social History   Social History  . Marital status: Widowed    Spouse name: N/A  . Number of children: N/A  . Years of education: N/A   Occupational History  . Retired    Social History Main Topics  . Smoking status: Former Research scientist (life sciences)  . Smokeless tobacco: Never Used     Comment: quit smoking in 2000  . Alcohol use No  . Drug use: No  . Sexual activity: Not on file   Other Topics Concern  . Not on file   Social History Narrative  . No narrative on file      Review of Systems  All other systems reviewed and are  negative.      Objective:   Physical Exam  Cardiovascular: Normal rate, regular rhythm and normal heart sounds.   Pulmonary/Chest: Effort normal and breath sounds normal.  Musculoskeletal:       Left knee: She exhibits decreased range of motion and bony tenderness. She exhibits no LCL laxity, normal meniscus and no MCL laxity. Tenderness found. Medial joint line and lateral joint line tenderness noted.  Vitals reviewed.         Assessment & Plan:  Acute pain of left knee  Vaginitis and vulvovaginitis  Patient has osteoarthritis in her left knee. She declines a cortisone injection. We will try Voltaren gel 2 g 4 times a day as needed for knee pain. If no better, I will proceed with an x-ray to confirm my suspicion and also offer the patient a cortisone injection. Patient is embarrassing declines a vaginal exam. Based on her history, I suspect bacterial vaginosis. I will try Flagyl 500 mg by mouth twice a day for 7 days. If symptoms do not improve, she will need a wet prep and a pelvic exam to rule out a fistula, etc.

## 2016-07-27 NOTE — Telephone Encounter (Signed)
Med was sent to mail order by mistake - med resent to [facility]

## 2016-08-05 ENCOUNTER — Other Ambulatory Visit: Payer: Self-pay | Admitting: Family Medicine

## 2016-08-05 MED ORDER — DICLOFENAC SODIUM 1 % TD GEL: 2.0000 g | Freq: Four times a day (QID) | TRANSDERMAL | 3 refills | Status: DC

## 2016-08-13 DIAGNOSIS — M109 Gout, unspecified: Secondary | ICD-10-CM | POA: Diagnosis not present

## 2016-08-13 DIAGNOSIS — D631 Anemia in chronic kidney disease: Secondary | ICD-10-CM | POA: Diagnosis not present

## 2016-08-13 DIAGNOSIS — N2581 Secondary hyperparathyroidism of renal origin: Secondary | ICD-10-CM | POA: Diagnosis not present

## 2016-08-13 DIAGNOSIS — N189 Chronic kidney disease, unspecified: Secondary | ICD-10-CM | POA: Diagnosis not present

## 2016-08-13 DIAGNOSIS — N184 Chronic kidney disease, stage 4 (severe): Secondary | ICD-10-CM | POA: Diagnosis not present

## 2016-08-13 DIAGNOSIS — I129 Hypertensive chronic kidney disease with stage 1 through stage 4 chronic kidney disease, or unspecified chronic kidney disease: Secondary | ICD-10-CM | POA: Diagnosis not present

## 2016-08-13 DIAGNOSIS — Z6841 Body Mass Index (BMI) 40.0 and over, adult: Secondary | ICD-10-CM | POA: Diagnosis not present

## 2016-08-13 DIAGNOSIS — E1129 Type 2 diabetes mellitus with other diabetic kidney complication: Secondary | ICD-10-CM | POA: Diagnosis not present

## 2016-08-13 DIAGNOSIS — R809 Proteinuria, unspecified: Secondary | ICD-10-CM | POA: Diagnosis not present

## 2016-09-07 ENCOUNTER — Other Ambulatory Visit: Payer: Self-pay | Admitting: Family Medicine

## 2016-09-29 ENCOUNTER — Ambulatory Visit (INDEPENDENT_AMBULATORY_CARE_PROVIDER_SITE_OTHER): Payer: PPO | Admitting: Neurology

## 2016-09-29 ENCOUNTER — Encounter: Payer: Self-pay | Admitting: Neurology

## 2016-09-29 VITALS — BP 122/66 | HR 77 | Temp 97.8°F | Resp 16 | Ht 62.5 in | Wt 243.5 lb

## 2016-09-29 DIAGNOSIS — R569 Unspecified convulsions: Secondary | ICD-10-CM | POA: Diagnosis not present

## 2016-09-29 MED ORDER — DIVALPROEX SODIUM ER 500 MG PO TB24: ORAL_TABLET | ORAL | 3 refills | Status: DC

## 2016-09-29 NOTE — Progress Notes (Signed)
NEUROLOGY FOLLOW UP OFFICE NOTE  Emily Hughes [id number]  HISTORY OF PRESENT ILLNESS: I had the pleasure of seeing Emily Hughes in follow-up in the neurology clinic on 09/29/2016.  The patient was last seen 6 months ago for new onset seizure last December 2016 in the setting of hyperglycemia. Her 24-hour EEG was normal. She reported feeling "weird" and staggering gait on Depakote, and self-reduced dose to 500mg  daily. She was switched to Depakote ER 500mg  qhs and continues to deny any further similar episodes, no left arm jerking, staring/unresponsive episodes, gaps in time, or convulsions. She denies any headaches, dizziness, vision changes, focal numbness/tingling/weakness. She still feels unsteady and staggers, but is unsure if this is due to her knees or to the Depakote. She still does not feel herself on the medication.   HPI: This is a pleasant 76 yo RH woman with a history of diabetes, hypertension, CKD, CAD, who was admitted to [facility] last 06/12/15 for new onset seizure. She was in the shower when she suddenly felt dizzy like she would pass out, then her left arm extended up and started jerking uncontrollably. She fell to the floor and lay in the shower, denying any loss of consciousness. She thought her left leg would not move, but it did, and was able to get out of the shower. Her sister called and she told her she was not feeling good. Family brought her to the ER where she was witnessed to have left arm stiffening and jerking followed by a generalized tonic-clonic seizure. Glucose on arrival was 683 with metabolic acidosis, and received treatment for DKA. Her CBC showed a WBC of 14.4, UDS negative. She reports that she had not yet eaten that morning and had not taken her insulin yet. She denied any focal weakness after the seizures. I personally reviewed MRI brain without contrast which did not show any acute changes, there was mild diffuse volume loss. Her routine wake and sleep EEG was  normal. She was started on Vimpat 100mg  BID, however once she was discharged, found that her insurance would not pay for Vimpat. She was started on Depakote 500mg  BID.   She denies any further seizures since hospital discharge. She denies any prior history of seizures, no headaches, further dizziness, diplopia, dysarthria, dysphagia, neck/back pain, focal numbness/tingling/weakness, bowel/bladder dysfunction. She denies any olfactory/gustatory hallucinations, deja vu, rising epigastric sensation. She used to have migraines but only has them infrequently now. She however does not like how Depakote makes her feel. She feels "crazy" on it, with a weird feeling of confusion, staggering when walking, poor balance. She has noticed she occasionally can't get her words out, "like my mind is trying to catch up." This is a new symptom for her. Dose was reduced by her PCP to 500mg  qhs 2 days ago. Her Depakote level on 500mg  BID was 49.2. She reports the staggering is better, but she still feels "weird" on the lower dose.  Epilepsy Risk Factors: She had a normal birth and early development. There is no history of febrile convulsions, CNS infections such as meningitis/encephalitis, significant traumatic brain injury, neurosurgical procedures, or family history of seizures.  PAST MEDICAL HISTORY: Past Medical History:  Diagnosis Date  . Anemia   . CAD (coronary artery disease)   . Colon polyps   . CRI (chronic renal insufficiency)   . DDD (degenerative disc disease)   . Diabetes mellitus    INSULIN DEPENDENT  . GERD (gastroesophageal reflux disease)   . High cholesterol   .  Hypertension   . Pancreatitis   . Proteinuria     MEDICATIONS: Current Outpatient Prescriptions on File Prior to Visit  Medication Sig Dispense Refill  . allopurinol (ZYLOPRIM) 300 MG tablet TAKE 1 TABLET ONCE DAILY. 90 tablet 2  . aspirin 81 MG tablet Take 81 mg by mouth daily.      . calcitRIOL (ROCALTROL) 0.25 MCG capsule Take  0.25 mcg by mouth daily.    . cetirizine (ZYRTEC) 10 MG tablet Take 1 tablet (10 mg total) by mouth daily. 30 tablet 11  . cloNIDine (CATAPRES) 0.1 MG tablet Take 1 tablet (0.1 mg total) by mouth 2 (two) times daily. 180 tablet 3  . colchicine 0.6 MG tablet TAKE 2 TABLETS NOW AND REPEAT 1 TABLET IN 1 HOUR PRN GOUT FLARE 30 tablet 0  . diclofenac sodium (VOLTAREN) 1 % GEL Apply 2 g topically 4 (four) times daily. 7 Tube 3  . diltiazem (CARDIZEM CD) 300 MG 24 hr capsule Take 1 capsule (300 mg total) by mouth daily. 90 capsule 3  . divalproex (DEPAKOTE ER) 500 MG 24 hr tablet Take 1 tablet at bedtime 30 tablet 0  . ferrous fumarate (FERRO-SEQUELS) 50 MG CR tablet Take 50 mg by mouth daily.      . furosemide (LASIX) 40 MG tablet TAKE 2 TABLETS IN THE MORNING AND 1 TABLET IN THE EVENING. 270 tablet 1  . glimepiride (AMARYL) 4 MG tablet TAKE 1 TABLET ONCE DAILY. 30 tablet 0  . insulin aspart (NOVOLOG) 100 UNIT/ML FlexPen Inject 10 Units into the skin daily with lunch. (Patient taking differently: Inject 10 Units into the skin. Sliding Scale) 15 mL 3  . Insulin Glargine (LANTUS SOLOSTAR) 100 UNIT/ML Solostar Pen Inject 38 Units into the skin daily at 10 pm.    . levothyroxine (SYNTHROID, LEVOTHROID) 50 MCG tablet Take 1 tablet (50 mcg total) by mouth daily. 90 tablet 3  . metoprolol succinate (TOPROL-XL) 25 MG 24 hr tablet TAKE (1) TABLET DAILY. 90 tablet 3  . metroNIDAZOLE (FLAGYL) 500 MG tablet Take 1 tablet (500 mg total) by mouth 2 (two) times daily. 14 tablet 0  . mometasone (ELOCON) 0.1 % ointment APPLY TOPICALLY DAILY. 45 g 3  . omeprazole (PRILOSEC) 40 MG capsule Take 40 mg by mouth daily.     . pravastatin (PRAVACHOL) 80 MG tablet TAKE ONE TABLET AT BEDTIME. 90 tablet 3  . sodium bicarbonate 650 MG tablet Take 1 tablet (650 mg total) by mouth 4 (four) times daily. 360 tablet 1  . sucralfate (CARAFATE) 1 G tablet Take 1 tablet (1 g total) by mouth 4 (four) times daily -  with meals and at  bedtime. 120 tablet 0  . UNABLE TO FIND Takes a potassium suspension daily     No current facility-administered medications on file prior to visit.     ALLERGIES: Allergies  Allergen Reactions  . Ace Inhibitors   . Actos [Pioglitazone Hydrochloride]   . Dust Mite Extract   . Dye Fdc Red [Erythrosine Red No. 3]     FAMILY HISTORY: Family History  Problem Relation Age of Onset  . Diabetes Mother   . Diabetes Father   . Diabetes Sister     SOCIAL HISTORY: Social History   Social History  . Marital status: Widowed    Spouse name: N/A  . Number of children: N/A  . Years of education: N/A   Occupational History  . Retired    Social History Main Topics  . Smoking status:  Former Smoker  . Smokeless tobacco: Never Used     Comment: quit smoking in 2000  . Alcohol use No  . Drug use: No  . Sexual activity: Not on file   Other Topics Concern  . Not on file   Social History Narrative  . No narrative on file    REVIEW OF SYSTEMS: Constitutional: No fevers, chills, or sweats, no generalized fatigue, change in appetite Eyes: No visual changes, double vision, eye pain Ear, nose and throat: No hearing loss, ear pain, nasal congestion, sore throat Cardiovascular: No chest pain, palpitations Respiratory:  No shortness of breath at rest or with exertion, wheezes GastrointestinaI: No nausea, vomiting, diarrhea, abdominal pain, fecal incontinence Genitourinary:  No dysuria, urinary retention or frequency Musculoskeletal:  No neck pain, back pain Integumentary: No rash, pruritus, skin lesions Neurological: as above Psychiatric: No depression, insomnia, anxiety Endocrine: No palpitations, fatigue, diaphoresis, mood swings, change in appetite, change in weight, increased thirst Hematologic/Lymphatic:  No anemia, purpura, petechiae. Allergic/Immunologic: no itchy/runny eyes, nasal congestion, recent allergic reactions, rashes  PHYSICAL EXAM: Vitals:   09/29/16 0835  BP:  122/66  Pulse: 77  Resp: 16  Temp: 97.8 F (36.6 C)   General: No acute distress Head:  Normocephalic/atraumatic Neck: supple, no paraspinal tenderness, full range of motion Heart:  Regular rate and rhythm Lungs:  Clear to auscultation bilaterally Back: No paraspinal tenderness Skin/Extremities: No rash, no edema Neurological Exam: alert and oriented to person, place, and time. No aphasia or dysarthria. Fund of knowledge is appropriate.  Recent and remote memory are intact.  Attention and concentration are normal.    Able to name objects and repeat phrases.  Cranial nerves: CN I: not tested CN II: pupils equal, round and reactive to light, visual fields intact, fundi unremarkable. CN III, IV, VI: full range of motion, no nystagmus, no ptosis CN V: facial sensation intact CN VII: upper and lower face symmetric CN VIII: hearing intact to finger rub CN IX, X: gag intact, uvula midline CN XI: sternocleidomastoid and trapezius muscles intact CN XII: tongue midline Bulk & Tone: normal, no fasciculations. Motor: 5/5 throughout with no pronator drift. Sensation: intact to light touch, cold, pin, vibration sense. No extinction to double simultaneous stimulation. Romberg test negative Deep Tendon Reflexes: +1 throughout, no ankle clonus Plantar responses: downgoing bilaterally Cerebellar: no incoordination on finger to nose testing Gait: slow and cautious reporting her knees are bothering her today, no ataxia Tremor: no resting tremor, +mild high frequency low amplitude postural and endpoint tremor bilaterally (L>R, similar to prior visit)  IMPRESSION: This is a pleasant 76 yo RH woman with a history of hypertension, diabetes, CKD, with new onset seizures last 06/12/2015. She had 2 focal seizures that started with left arm jerking, one of which was witnessed to progress to a GTC. Her glucose level was reported to be in the 460s on admission. Focal motor seizures can be seen with  hyperglycemia. She has no other clear epilepsy risk factors, neurological exam normal. Her MRI brain was unremarkable, routine and 24-hour EEG normal. Seizure possibly due to hyperglycemia. We discussed continuing Depakote until 2 years seizure-free, at which point we will start slowly weaning it down. The staggering appears to be mechanical due to knee problems rather than medication, she does not have any signs of neuropathy or muscle weakness on exam today. Continue Depakote ER 500mg  qhs for now. We agreed to another re-evaluation in December 2018, she would like to try exercise first to work on her  knees to address staggering issue. Continue control of glucose. She is aware of [place] driving laws to stop driving after a seizure until 6 months seizure-free.   Thank you for allowing me to participate in her care.  Please do not hesitate to call for any questions or concerns.  The duration of this appointment visit was 25 minutes of face-to-face time with the patient.  Greater than 50% of this time was spent in counseling, explanation of diagnosis, planning of further management, and coordination of care.   Ellouise Newer, M.D.   CC: Dr. Dennard Schaumann

## 2016-09-29 NOTE — Patient Instructions (Signed)
1. Continue Depakote ER 500mg  at bedtime 2. Start exercising to help with staggering/knees 3. Follow-up in 9 months (December 2018), call for any problems  Seizure Precautions: 1. If medication has been prescribed for you to prevent seizures, take it exactly as directed.  Do not stop taking the medicine without talking to your doctor first, even if you have not had a seizure in a long time.   2. Avoid activities in which a seizure would cause danger to yourself or to others.  Don't operate dangerous machinery, swim alone, or climb in high or dangerous places, such as on ladders, roofs, or girders.  Do not drive unless your doctor says you may.  3. If you have any warning that you may have a seizure, lay down in a safe place where you can't hurt yourself.    4.  No driving for 6 months from last seizure, as per [facility].   Please refer to the following link on the [place] website for more information: [email or web address]   5.  Maintain good sleep hygiene. Avoid alcohol.  6.  Contact your doctor if you have any problems that may be related to the medicine you are taking.  7.  Call 911 and bring the patient back to the ED if:        A.  The seizure lasts longer than 5 minutes.       B.  The patient doesn't awaken shortly after the seizure  C.  The patient has new problems such as difficulty seeing, speaking or moving  D.  The patient was injured during the seizure  E.  The patient has a temperature over 102 F (39C)  F.  The patient vomited and now is having trouble breathing

## 2016-10-06 ENCOUNTER — Other Ambulatory Visit: Payer: Self-pay | Admitting: Family Medicine

## 2016-10-06 NOTE — Telephone Encounter (Signed)
Medication refill for one time only.  Patient needs to be seen.  Letter sent for patient to call and schedule 

## 2016-11-06 ENCOUNTER — Other Ambulatory Visit: Payer: Self-pay | Admitting: Family Medicine

## 2016-11-13 ENCOUNTER — Emergency Department (HOSPITAL_COMMUNITY)
Admission: EM | Admit: 2016-11-13 | Discharge: 2016-11-13 | Disposition: A | Discharge status: Home / Self Care | Payer: PPO | Attending: Emergency Medicine | Admitting: Emergency Medicine

## 2016-11-13 ENCOUNTER — Encounter (HOSPITAL_COMMUNITY): Payer: Self-pay | Admitting: Emergency Medicine

## 2016-11-13 DIAGNOSIS — N184 Chronic kidney disease, stage 4 (severe): Secondary | ICD-10-CM | POA: Insufficient documentation

## 2016-11-13 DIAGNOSIS — Z79899 Other long term (current) drug therapy: Secondary | ICD-10-CM | POA: Diagnosis not present

## 2016-11-13 DIAGNOSIS — E1122 Type 2 diabetes mellitus with diabetic chronic kidney disease: Secondary | ICD-10-CM | POA: Insufficient documentation

## 2016-11-13 DIAGNOSIS — Z7982 Long term (current) use of aspirin: Secondary | ICD-10-CM | POA: Insufficient documentation

## 2016-11-13 DIAGNOSIS — Z794 Long term (current) use of insulin: Secondary | ICD-10-CM | POA: Insufficient documentation

## 2016-11-13 DIAGNOSIS — I129 Hypertensive chronic kidney disease with stage 1 through stage 4 chronic kidney disease, or unspecified chronic kidney disease: Secondary | ICD-10-CM | POA: Insufficient documentation

## 2016-11-13 DIAGNOSIS — Z87891 Personal history of nicotine dependence: Secondary | ICD-10-CM | POA: Insufficient documentation

## 2016-11-13 DIAGNOSIS — M542 Cervicalgia: Secondary | ICD-10-CM | POA: Diagnosis not present

## 2016-11-13 DIAGNOSIS — I2510 Atherosclerotic heart disease of native coronary artery without angina pectoris: Secondary | ICD-10-CM | POA: Insufficient documentation

## 2016-11-13 DIAGNOSIS — M62838 Other muscle spasm: Secondary | ICD-10-CM

## 2016-11-13 MED ORDER — DIAZEPAM 5 MG PO TABS
5.0000 mg | ORAL_TABLET | Freq: Once | ORAL | Status: AC
  Administered 2016-11-13: 5 mg via ORAL
  Filled 2016-11-13: qty 1

## 2016-11-13 MED ORDER — METHOCARBAMOL 500 MG PO TABS
500.0000 mg | ORAL_TABLET | Freq: Two times a day (BID) | ORAL | 0 refills | Status: DC
Start: 2016-11-13 — End: 2016-11-13

## 2016-11-13 MED ORDER — METHOCARBAMOL 500 MG PO TABS: 500.0000 mg | ORAL_TABLET | Freq: Two times a day (BID) | ORAL | 0 refills | Status: DC

## 2016-11-13 NOTE — Discharge Instructions (Signed)
Please read and follow all provided instructions.  Your diagnoses today include:  1. Trapezius muscle spasm     Tests performed today include: Vital signs. See below for your results today.   Medications prescribed:  Take as prescribed   Home care instructions:  Follow any educational materials contained in this packet.  Follow-up instructions: Please follow-up with your primary care provider for further evaluation of symptoms and treatment   Return instructions:  Please return to the Emergency Department if you do not get better, if you get worse, or new symptoms OR  - Fever (temperature greater than 101.18F)  - Bleeding that does not stop with holding pressure to the area    -Severe pain (please note that you may be more sore the day after your accident)  - Chest Pain  - Difficulty breathing  - Severe nausea or vomiting  - Inability to tolerate food and liquids  - Passing out  - Skin becoming red around your wounds  - Change in mental status (confusion or lethargy)  - New numbness or weakness    Please return if you have any other emergent concerns.  Additional Information:  Your vital signs today were: BP (!) 145/58 (BP Location: Left Arm)    Pulse 83    Temp 98 F (36.7 C) (Oral)    Resp 16    Ht 5\' 3"  (1.6 m)    Wt 106.6 kg    SpO2 100%    BMI 41.63 kg/m  If your blood pressure (BP) was elevated above 135/85 this visit, please have this repeated by your doctor within one month. ---------------

## 2016-11-13 NOTE — ED Triage Notes (Signed)
Per complaint of ongoing left neck pain for a week; denies injury.

## 2016-11-13 NOTE — ED Provider Notes (Signed)
[facility] DEPT Provider Note   CSN: [id number] Arrival date & time: 11/13/16  3662     History   Chief Complaint Chief Complaint  Patient presents with  . Neck Pain    HPI Emily Hughes is a 76 y.o. female.  HPI  76 y.o. female with a hx of CAD, DDD, DM, HTN, HLD, presents to the Emergency Department today complaining of ongoing left sided neck pain x 1.5 weeks. Denies injury to area. Notes pain is isolated to left side. No CP/SOB/ABD pain. No syncope or near syncope. No lightheadedness. No visual changes. No midline neck tenderness. Pt states that pain is more on the left neck muscles and feels like a muscle spasm. Notes constant pain. Attempted OTC remedies with minimal relief. No fevers. No cough. No URI symptoms. No other symptoms noted.   Past Medical History:  Diagnosis Date  . Anemia   . CAD (coronary artery disease)   . Colon polyps   . CRI (chronic renal insufficiency)   . DDD (degenerative disc disease)   . Diabetes mellitus    INSULIN DEPENDENT  . GERD (gastroesophageal reflux disease)   . High cholesterol   . Hypertension   . Pancreatitis   . Proteinuria     Patient Active Problem List   Diagnosis Date Noted  . Convulsions ([place]) 07/24/2015  . Type 2 diabetes mellitus with chronic kidney disease, without long-term current use of insulin ([place]) 07/24/2015  . Leukocytosis   . Chronic kidney disease (CKD), stage IV (severe) ([place]) 06/13/2015  . Seizures ([place]) 06/12/2015  . DKA (diabetic ketoacidoses) ([place]) 06/12/2015  . Seizure ([place]) 06/12/2015  . High cholesterol   . CAD (coronary artery disease)   . Proteinuria   . GERD (gastroesophageal reflux disease)   . DYSLIPIDEMIA 02/27/2010  . OBESITY, MORBID 02/27/2010  . DM 02/24/2010  . RHEUMATIC FEVER 02/24/2010  . Essential hypertension 02/24/2010  . RENAL INSUFFICIENCY, CHRONIC 02/24/2010    Past Surgical History:  Procedure Laterality Date  . ABDOMINAL HYSTERECTOMY    . ANGIOPLASTY    . ANKLE  SURGERY Right   . BACK SURGERY    . TONSILLECTOMY      OB History    No data available       Home Medications    Prior to Admission medications   Medication Sig Start Date End Date Taking? Authorizing Provider  allopurinol (ZYLOPRIM) 300 MG tablet TAKE 1 TABLET ONCE DAILY. 12/19/14   Susy Frizzle, MD  aspirin 81 MG tablet Take 81 mg by mouth daily.      [provider]  calcitRIOL (ROCALTROL) 0.25 MCG capsule Take 0.25 mcg by mouth daily.    [provider]  cetirizine (ZYRTEC) 10 MG tablet Take 1 tablet (10 mg total) by mouth daily. 12/04/13   Susy Frizzle, MD  cloNIDine (CATAPRES) 0.1 MG tablet Take 1 tablet (0.1 mg total) by mouth 2 (two) times daily. 07/19/14   Susy Frizzle, MD  colchicine 0.6 MG tablet TAKE 2 TABLETS NOW AND REPEAT 1 TABLET IN 1 HOUR PRN GOUT FLARE 09/07/16   Susy Frizzle, MD  diclofenac sodium (VOLTAREN) 1 % GEL Apply 2 g topically 4 (four) times daily. 08/05/16   Susy Frizzle, MD  diltiazem (CARDIZEM CD) 300 MG 24 hr capsule Take 1 capsule (300 mg total) by mouth daily. 06/15/16   Susy Frizzle, MD  DIOVAN 160 MG tablet Take 160 mg by mouth 2 (two) times daily. 09/06/16   [provider]  divalproex (DEPAKOTE ER) 500 MG 24 hr tablet Take 1 tablet at bedtime 09/29/16   Cameron Sprang, MD  ferrous fumarate (FERRO-SEQUELS) 50 MG CR tablet Take 50 mg by mouth daily.      [provider]  furosemide (LASIX) 40 MG tablet TAKE 2 TABLETS IN THE MORNING AND 1 TABLET IN THE EVENING. 08/12/15   Susy Frizzle, MD  glimepiride (AMARYL) 4 MG tablet TAKE 1 TABLET ONCE DAILY. 11/08/16   Susy Frizzle, MD  insulin aspart (NOVOLOG) 100 UNIT/ML FlexPen Inject 10 Units into the skin daily with lunch. Patient taking differently: Inject 10 Units into the skin. Sliding Scale 07/24/14   Susy Frizzle, MD  Insulin Glargine (LANTUS SOLOSTAR) 100 UNIT/ML Solostar Pen Inject 38 Units into the skin daily at 10 pm.    [provider]  levothyroxine (SYNTHROID, LEVOTHROID) 50 MCG tablet Take 1 tablet (50 mcg total) by mouth daily. 06/15/16   Susy Frizzle, MD  metoprolol succinate (TOPROL-XL) 25 MG 24 hr tablet TAKE (1) TABLET DAILY. 06/15/16   Susy Frizzle, MD  metroNIDAZOLE (FLAGYL) 500 MG tablet Take 1 tablet (500 mg total) by mouth 2 (two) times daily. Patient not taking: Reported on 09/29/2016 07/27/16   Susy Frizzle, MD  mometasone (ELOCON) 0.1 % ointment APPLY TOPICALLY DAILY. 04/25/14   Susy Frizzle, MD  omeprazole (PRILOSEC) 40 MG capsule Take 40 mg by mouth daily.  02/18/16   [provider]  pravastatin (PRAVACHOL) 80 MG tablet TAKE ONE TABLET AT BEDTIME. 06/15/16   Susy Frizzle, MD  sodium bicarbonate 650 MG tablet Take 1 tablet (650 mg total) by mouth 4 (four) times daily. Patient not taking: Reported on 09/29/2016 07/19/14   Susy Frizzle, MD  sucralfate (CARAFATE) 1 G tablet Take 1 tablet (1 g total) by mouth 4 (four) times daily -  with meals and at bedtime. Patient not taking: Reported on 09/29/2016 07/11/14   Susy Frizzle, MD  UNABLE TO FIND Takes a potassium suspension daily    [provider]    Family History Family History  Problem Relation Age of Onset  . Diabetes Mother   . Diabetes Father   . Diabetes Sister     Social History Social History  Substance Use Topics  . Smoking status: Former Research scientist (life sciences)  . Smokeless tobacco: Never Used     Comment: quit smoking in 2000  . Alcohol use No     Allergies   Ace inhibitors; Actos [pioglitazone hydrochloride]; Dust mite extract; and Dye fdc red [erythrosine red no. 3]   Review of Systems Review of Systems ROS reviewed and all are negative for acute change except as noted in the HPI.  Physical Exam Updated Vital Signs BP (!) 145/58 (BP Location: Left Arm)   Pulse 83   Temp 98 F (36.7 C) (Oral)   Resp 16   Ht 5\' 3"  (1.6 m)   Wt 106.6 kg   SpO2 100%   BMI 41.63 kg/m   Physical  Exam  Constitutional: She is oriented to person, place, and time. Vital signs are normal. She appears well-developed and well-nourished.  HENT:  Head: Normocephalic and atraumatic.  Right Ear: Hearing normal.  Left Ear: Hearing normal.  Eyes: Conjunctivae and EOM are normal. Pupils are equal, round, and reactive to light.  Neck: Trachea normal, normal range of motion and full passive range of motion without pain. Neck supple. Normal carotid pulses present. Muscular tenderness  present. No spinous process tenderness present. Carotid bruit is not present. No neck rigidity. No edema, no erythema and normal range of motion present. No thyroid mass present.  Noted TTP along left trapezius musculature. No midline spinous process tenderness. Full ROM of neck. Noted discomfort with lateral flexion. No palpable or visible deformities.   Cardiovascular: Normal rate, regular rhythm, normal heart sounds and intact distal pulses.   Pulmonary/Chest: Effort normal and breath sounds normal.  Abdominal: Soft. There is no tenderness.  Musculoskeletal: Normal range of motion.  Neurological: She is alert and oriented to person, place, and time.  Skin: Skin is warm and dry.  Psychiatric: She has a normal mood and affect. Her speech is normal and behavior is normal. Thought content normal.  Nursing note and vitals reviewed.  ED Treatments / Results  Labs (all labs ordered are listed, but only abnormal results are displayed) Labs Reviewed - No data to display  EKG  EKG Interpretation None       Radiology No results found.  Procedures Procedures (including critical care time)  Medications Ordered in ED Medications - No data to display   Initial Impression / Assessment and Plan / ED Course  I have reviewed the triage vital signs and the nursing notes.  Pertinent labs & imaging results that were available during my care of the patient were reviewed by me and considered in my medical decision making  (see chart for details).  Final Clinical Impressions(s) / ED Diagnoses     {I have reviewed the relevant previous healthcare records.  {I obtained HPI from historian. {Patient discussed with supervising physician.  ED Course:  Assessment: Pt is a 76 y.o. female with hx CAD, DDD, DM, HTN, HLD who presents with left sided neck pain x 1.5 weeks. No trauma to area. Notes pain is isolated to left side. No CP/SOB/ABD pain. No syncope or near syncope. No lightheadedness. No visual changes. No midline neck tenderness. Pt states that pain is more on the left neck muscles and feels like a muscle spasm. On exam, pt in NAD. Nontoxic/nonseptic appearing. VSS. Afebrile. Lungs CTA. Heart RRR. Abdomen nontender soft. Pt is TTP along left trapezius musculature. No midline spinous process tenderness. No palpable or visible deformities. Likely muscle spasm. Discussed with attending physician who has seen patient and agrees. Plan is to DC home with follow up to PCP. Strict return precautions given. At time of discharge, Patient is in no acute distress. Vital Signs are stable. Patient is able to ambulate. Patient able to tolerate PO.   Disposition/Plan:  DC Home Additional Verbal discharge instructions given and discussed with patient.  Pt Instructed to f/u with PCP in the next week for evaluation and treatment of symptoms. Return precautions given Pt acknowledges and agrees with plan  Supervising Physician Alfonzo Beers, MD  Final diagnoses:  Trapezius muscle spasm    New Prescriptions New Prescriptions   No medications on file     Shary Decamp, Hershal Coria 11/13/16 [place], MD 11/13/16 1210

## 2016-11-18 DIAGNOSIS — E1129 Type 2 diabetes mellitus with other diabetic kidney complication: Secondary | ICD-10-CM | POA: Diagnosis not present

## 2016-11-18 DIAGNOSIS — Z6841 Body Mass Index (BMI) 40.0 and over, adult: Secondary | ICD-10-CM | POA: Diagnosis not present

## 2016-11-18 DIAGNOSIS — I129 Hypertensive chronic kidney disease with stage 1 through stage 4 chronic kidney disease, or unspecified chronic kidney disease: Secondary | ICD-10-CM | POA: Diagnosis not present

## 2016-11-18 DIAGNOSIS — D631 Anemia in chronic kidney disease: Secondary | ICD-10-CM | POA: Diagnosis not present

## 2016-11-18 DIAGNOSIS — N189 Chronic kidney disease, unspecified: Secondary | ICD-10-CM | POA: Diagnosis not present

## 2016-11-18 DIAGNOSIS — N2581 Secondary hyperparathyroidism of renal origin: Secondary | ICD-10-CM | POA: Diagnosis not present

## 2016-11-18 DIAGNOSIS — N184 Chronic kidney disease, stage 4 (severe): Secondary | ICD-10-CM | POA: Diagnosis not present

## 2016-11-18 DIAGNOSIS — R809 Proteinuria, unspecified: Secondary | ICD-10-CM | POA: Diagnosis not present

## 2016-11-18 DIAGNOSIS — M109 Gout, unspecified: Secondary | ICD-10-CM | POA: Diagnosis not present

## 2016-11-23 ENCOUNTER — Encounter: Payer: Self-pay | Admitting: Family Medicine

## 2016-12-01 DIAGNOSIS — Z1231 Encounter for screening mammogram for malignant neoplasm of breast: Secondary | ICD-10-CM | POA: Diagnosis not present

## 2016-12-06 ENCOUNTER — Other Ambulatory Visit: Payer: Self-pay | Admitting: Family Medicine

## 2017-02-11 ENCOUNTER — Other Ambulatory Visit: Payer: Self-pay | Admitting: Family Medicine

## 2017-02-23 DIAGNOSIS — N2581 Secondary hyperparathyroidism of renal origin: Secondary | ICD-10-CM | POA: Diagnosis not present

## 2017-02-23 DIAGNOSIS — N184 Chronic kidney disease, stage 4 (severe): Secondary | ICD-10-CM | POA: Diagnosis not present

## 2017-02-23 DIAGNOSIS — D631 Anemia in chronic kidney disease: Secondary | ICD-10-CM | POA: Diagnosis not present

## 2017-02-23 DIAGNOSIS — E1129 Type 2 diabetes mellitus with other diabetic kidney complication: Secondary | ICD-10-CM | POA: Diagnosis not present

## 2017-02-23 DIAGNOSIS — Z6841 Body Mass Index (BMI) 40.0 and over, adult: Secondary | ICD-10-CM | POA: Diagnosis not present

## 2017-02-23 DIAGNOSIS — N189 Chronic kidney disease, unspecified: Secondary | ICD-10-CM | POA: Diagnosis not present

## 2017-02-23 DIAGNOSIS — R809 Proteinuria, unspecified: Secondary | ICD-10-CM | POA: Diagnosis not present

## 2017-02-23 DIAGNOSIS — M109 Gout, unspecified: Secondary | ICD-10-CM | POA: Diagnosis not present

## 2017-02-23 DIAGNOSIS — I129 Hypertensive chronic kidney disease with stage 1 through stage 4 chronic kidney disease, or unspecified chronic kidney disease: Secondary | ICD-10-CM | POA: Diagnosis not present

## 2017-03-30 ENCOUNTER — Ambulatory Visit (INDEPENDENT_AMBULATORY_CARE_PROVIDER_SITE_OTHER): Payer: PPO | Admitting: Family Medicine

## 2017-03-30 DIAGNOSIS — Z23 Encounter for immunization: Secondary | ICD-10-CM | POA: Diagnosis not present

## 2017-04-07 ENCOUNTER — Other Ambulatory Visit: Payer: Self-pay | Admitting: Family Medicine

## 2017-04-07 DIAGNOSIS — Z794 Long term (current) use of insulin: Secondary | ICD-10-CM | POA: Diagnosis not present

## 2017-04-07 DIAGNOSIS — E1110 Type 2 diabetes mellitus with ketoacidosis without coma: Secondary | ICD-10-CM | POA: Diagnosis not present

## 2017-04-07 DIAGNOSIS — Z86010 Personal history of colonic polyps: Secondary | ICD-10-CM | POA: Diagnosis not present

## 2017-04-07 NOTE — Telephone Encounter (Signed)
Medication refill for one time only.  Patient needs to be seen.  Letter sent for patient to call and schedule 

## 2017-04-15 ENCOUNTER — Other Ambulatory Visit: Payer: Self-pay | Admitting: Neurology

## 2017-04-15 ENCOUNTER — Other Ambulatory Visit: Payer: Self-pay

## 2017-04-15 DIAGNOSIS — R569 Unspecified convulsions: Secondary | ICD-10-CM

## 2017-04-15 MED ORDER — DIVALPROEX SODIUM ER 500 MG PO TB24: 500.0000 mg | ORAL_TABLET | Freq: Every day | ORAL | 1 refills | Status: DC

## 2017-04-19 ENCOUNTER — Encounter: Payer: Self-pay | Admitting: Family Medicine

## 2017-04-19 ENCOUNTER — Ambulatory Visit (INDEPENDENT_AMBULATORY_CARE_PROVIDER_SITE_OTHER): Payer: PPO | Admitting: Family Medicine

## 2017-04-19 VITALS — BP 136/70 | HR 86 | Temp 97.6°F | Resp 16 | Wt 239.0 lb

## 2017-04-19 DIAGNOSIS — B9689 Other specified bacterial agents as the cause of diseases classified elsewhere: Secondary | ICD-10-CM | POA: Diagnosis not present

## 2017-04-19 DIAGNOSIS — J019 Acute sinusitis, unspecified: Secondary | ICD-10-CM | POA: Diagnosis not present

## 2017-04-19 DIAGNOSIS — N186 End stage renal disease: Secondary | ICD-10-CM | POA: Diagnosis not present

## 2017-04-19 DIAGNOSIS — E1122 Type 2 diabetes mellitus with diabetic chronic kidney disease: Secondary | ICD-10-CM | POA: Diagnosis not present

## 2017-04-19 DIAGNOSIS — E039 Hypothyroidism, unspecified: Secondary | ICD-10-CM | POA: Diagnosis not present

## 2017-04-19 MED ORDER — AMOXICILLIN 875 MG PO TABS: 875.0000 mg | ORAL_TABLET | Freq: Two times a day (BID) | ORAL | 0 refills | Status: DC

## 2017-04-19 NOTE — Progress Notes (Signed)
Subjective:    Patient ID: Emily Hughes, female    DOB: Jan 04, 1941, 76 y.o.   MRN: [id number]  HPI Here today because she feels sick.  However, patient was last seen regarding her diabetes and CKD in 02/2016.  At that time, Cr was 2.55, HgA1c was 6.7, and FLP was excellent, but she has not followed up since.  She did see her nephrologist in 08/2016.  I explained to the patient that she was due for fasting lab work as well as a TSH to ensure adequate dosage of her levothyroxine. She would like to return next week to do this. Her concern is sinus pain and pressure that has been present for 2-1/2 weeks. Pain is located above both eyes. She has had congestion and postnasal drip, headaches, cough secondary to postnasal drip, scratchy throat secondary to postnasal drip. Symptoms are gradually getting worse despite conservative tincture of time Past Medical History:  Diagnosis Date  . Anemia   . CAD (coronary artery disease)   . Colon polyps   . CRI (chronic renal insufficiency)   . DDD (degenerative disc disease)   . Diabetes mellitus    INSULIN DEPENDENT  . GERD (gastroesophageal reflux disease)   . High cholesterol   . Hypertension   . Pancreatitis   . Proteinuria    Past Surgical History:  Procedure Laterality Date  . ABDOMINAL HYSTERECTOMY    . ANGIOPLASTY    . ANKLE SURGERY Right   . BACK SURGERY    . TONSILLECTOMY     Current Outpatient Prescriptions on File Prior to Visit  Medication Sig Dispense Refill  . allopurinol (ZYLOPRIM) 300 MG tablet TAKE 1 TABLET ONCE DAILY. 90 tablet 2  . aspirin 81 MG tablet Take 81 mg by mouth daily.      . calcitRIOL (ROCALTROL) 0.25 MCG capsule Take 0.25 mcg by mouth daily.    . cetirizine (ZYRTEC) 10 MG tablet Take 1 tablet (10 mg total) by mouth daily. 30 tablet 11  . cloNIDine (CATAPRES) 0.1 MG tablet Take 1 tablet (0.1 mg total) by mouth 2 (two) times daily. 180 tablet 3  . colchicine 0.6 MG tablet TAKE 2 TABLETS NOW AND REPEAT 1 TABLET IN 1  HOUR AS NEEDED FOR GOUT FLARE 30 tablet 0  . diclofenac sodium (VOLTAREN) 1 % GEL Apply 2 g topically 4 (four) times daily. 7 Tube 3  . diltiazem (CARDIZEM CD) 300 MG 24 hr capsule Take 1 capsule (300 mg total) by mouth daily. 90 capsule 3  . DIOVAN 160 MG tablet Take 160 mg by mouth 2 (two) times daily.    . divalproex (DEPAKOTE ER) 500 MG 24 hr tablet Take 1 tablet at bedtime 90 tablet 3  . divalproex (DEPAKOTE ER) 500 MG 24 hr tablet Take 1 tablet (500 mg total) by mouth at bedtime. 90 tablet 1  . ferrous fumarate (FERRO-SEQUELS) 50 MG CR tablet Take 50 mg by mouth daily.      . furosemide (LASIX) 40 MG tablet TAKE 2 TABLETS IN THE MORNING AND 1 TABLET IN THE EVENING. 270 tablet 1  . glimepiride (AMARYL) 4 MG tablet TAKE 1 TABLET ONCE DAILY. 30 tablet 0  . insulin aspart (NOVOLOG) 100 UNIT/ML FlexPen Inject 10 Units into the skin daily with lunch. (Patient taking differently: Inject 10 Units into the skin. Sliding Scale) 15 mL 3  . Insulin Glargine (LANTUS SOLOSTAR) 100 UNIT/ML Solostar Pen Inject 38 Units into the skin daily at 10 pm.    .  levothyroxine (SYNTHROID, LEVOTHROID) 50 MCG tablet Take 1 tablet (50 mcg total) by mouth daily. 90 tablet 3  . methocarbamol (ROBAXIN) 500 MG tablet Take 1 tablet (500 mg total) by mouth 2 (two) times daily. 20 tablet 0  . metoprolol succinate (TOPROL-XL) 25 MG 24 hr tablet TAKE (1) TABLET DAILY. 90 tablet 3  . metroNIDAZOLE (FLAGYL) 500 MG tablet Take 1 tablet (500 mg total) by mouth 2 (two) times daily. (Patient not taking: Reported on 09/29/2016) 14 tablet 0  . mometasone (ELOCON) 0.1 % ointment APPLY TOPICALLY DAILY. 45 g 3  . omeprazole (PRILOSEC) 40 MG capsule Take 40 mg by mouth daily.     . pravastatin (PRAVACHOL) 80 MG tablet TAKE ONE TABLET AT BEDTIME. 90 tablet 3  . sodium bicarbonate 650 MG tablet Take 1 tablet (650 mg total) by mouth 4 (four) times daily. (Patient not taking: Reported on 09/29/2016) 360 tablet 1  . sucralfate (CARAFATE) 1 G  tablet Take 1 tablet (1 g total) by mouth 4 (four) times daily -  with meals and at bedtime. (Patient not taking: Reported on 09/29/2016) 120 tablet 0  . UNABLE TO FIND Takes a potassium suspension daily     No current facility-administered medications on file prior to visit.    Allergies  Allergen Reactions  . Ace Inhibitors   . Actos [Pioglitazone Hydrochloride]   . Dust Mite Extract   . Dye Fdc Red [Erythrosine Red No. 3]    Social History   Social History  . Marital status: Widowed    Spouse name: N/A  . Number of children: N/A  . Years of education: N/A   Occupational History  . Retired    Social History Main Topics  . Smoking status: Former Research scientist (life sciences)  . Smokeless tobacco: Never Used     Comment: quit smoking in 2000  . Alcohol use No  . Drug use: No  . Sexual activity: Not on file   Other Topics Concern  . Not on file   Social History Narrative  . No narrative on file      Review of Systems  All other systems reviewed and are negative.      Objective:   Physical Exam  HENT:  Nose: Mucosal edema and rhinorrhea present. Right sinus exhibits maxillary sinus tenderness and frontal sinus tenderness. Left sinus exhibits maxillary sinus tenderness and frontal sinus tenderness.  Cardiovascular: Normal rate, regular rhythm and normal heart sounds.   Pulmonary/Chest: Effort normal and breath sounds normal. No respiratory distress. She has no wheezes. She has no rales.  Abdominal: Soft. Bowel sounds are normal.  Vitals reviewed.         Assessment & Plan:  Acute bacterial rhinosinusitis - Plan: amoxicillin (AMOXIL) 875 MG tablet  Type II diabetes mellitus with end-stage renal disease (HCC) - Plan: COMPLETE METABOLIC PANEL WITH GFR, Lipid panel, CBC with Differential/Platelet, Hemoglobin A1c, Microalbumin, urine, TSH  Hypothyroidism, unspecified type - Plan: TSH  Start amoxicillin 875 mg by mouth twice a day for 10 days. Recheck in one week if no better or  sooner if worse. Return fasting for lab work regarding her diabetes and hypothyroidism including a TSH, fasting lipid panel, hemoglobin A1c. Goal hemoglobin A1c is less than 7. Goal LDL cholesterolis less than 100

## 2017-05-04 ENCOUNTER — Other Ambulatory Visit: Payer: Self-pay | Admitting: Family Medicine

## 2017-05-07 ENCOUNTER — Other Ambulatory Visit: Payer: Self-pay | Admitting: Family Medicine

## 2017-05-09 ENCOUNTER — Other Ambulatory Visit: Payer: PPO

## 2017-05-09 DIAGNOSIS — E039 Hypothyroidism, unspecified: Secondary | ICD-10-CM | POA: Diagnosis not present

## 2017-05-09 DIAGNOSIS — E1122 Type 2 diabetes mellitus with diabetic chronic kidney disease: Secondary | ICD-10-CM | POA: Diagnosis not present

## 2017-05-09 DIAGNOSIS — N186 End stage renal disease: Secondary | ICD-10-CM | POA: Diagnosis not present

## 2017-05-10 LAB — COMPLETE METABOLIC PANEL WITH GFR
AG Ratio: 1.1 (calc) (ref 1.0–2.5)
ALT: 12 U/L (ref 6–29)
AST: 20 U/L (ref 10–35)
Albumin: 3.8 g/dL (ref 3.6–5.1)
Alkaline phosphatase (APISO): 62 U/L (ref 33–130)
BUN/Creatinine Ratio: 26 (calc) — ABNORMAL HIGH (ref 6–22)
BUN: 93 mg/dL — AB (ref 7–25)
CALCIUM: 10.2 mg/dL (ref 8.6–10.4)
CO2: 28 mmol/L (ref 20–32)
Chloride: 104 mmol/L (ref 98–110)
Creat: 3.51 mg/dL — ABNORMAL HIGH (ref 0.60–0.93)
GFR, EST AFRICAN AMERICAN: 14 mL/min/{1.73_m2} — AB (ref >=60)
GFR, EST NON AFRICAN AMERICAN: 12 mL/min/{1.73_m2} — AB (ref >=60)
GLUCOSE: 69 mg/dL (ref 65–99)
Globulin: 3.6 g/dL (calc) (ref 1.9–3.7)
Potassium: 4.8 mmol/L (ref 3.5–5.3)
Sodium: 142 mmol/L (ref 135–146)
TOTAL PROTEIN: 7.4 g/dL (ref 6.1–8.1)
Total Bilirubin: 0.3 mg/dL (ref 0.2–1.2)

## 2017-05-10 LAB — LIPID PANEL
CHOL/HDL RATIO: 3.9 (calc) (ref <5.0)
Cholesterol: 136 mg/dL (ref <200)
HDL: 35 mg/dL — AB (ref >50)
LDL CHOLESTEROL (CALC): 79 mg/dL
Non-HDL Cholesterol (Calc): 101 mg/dL (calc) (ref <130)
TRIGLYCERIDES: 120 mg/dL (ref <150)

## 2017-05-10 LAB — CBC WITH DIFFERENTIAL/PLATELET
BASOS ABS: 69 {cells}/uL (ref 0–200)
Basophils Relative: 0.9 %
EOS ABS: 200 {cells}/uL (ref 15–500)
Eosinophils Relative: 2.6 %
HCT: 30.9 % — ABNORMAL LOW (ref 35.0–45.0)
Hemoglobin: 9.9 g/dL — ABNORMAL LOW (ref 11.7–15.5)
Lymphs Abs: 3342 cells/uL (ref 850–3900)
MCH: 28.5 pg (ref 27.0–33.0)
MCHC: 32 g/dL (ref 32.0–36.0)
MCV: 89 fL (ref 80.0–100.0)
MPV: 10.7 fL (ref 7.5–12.5)
Monocytes Relative: 7.8 %
NEUTROS PCT: 45.3 %
Neutro Abs: 3488 cells/uL (ref 1500–7800)
PLATELETS: 269 10*3/uL (ref 140–400)
RBC: 3.47 10*6/uL — ABNORMAL LOW (ref 3.80–5.10)
RDW: 14.5 % (ref 11.0–15.0)
TOTAL LYMPHOCYTE: 43.4 %
WBC: 7.7 10*3/uL (ref 3.8–10.8)
WBCMIX: 601 {cells}/uL (ref 200–950)

## 2017-05-10 LAB — MICROALBUMIN, URINE: MICROALB UR: 0.6 mg/dL

## 2017-05-10 LAB — HEMOGLOBIN A1C
Hgb A1c MFr Bld: 6.8 % of total Hgb — ABNORMAL HIGH (ref <5.7)
Mean Plasma Glucose: 148 (calc)
eAG (mmol/L): 8.2 (calc)

## 2017-05-10 LAB — TSH: TSH: 3.13 mIU/L (ref 0.40–4.50)

## 2017-05-16 DIAGNOSIS — Z86010 Personal history of colonic polyps: Secondary | ICD-10-CM | POA: Diagnosis not present

## 2017-05-16 DIAGNOSIS — D126 Benign neoplasm of colon, unspecified: Secondary | ICD-10-CM | POA: Diagnosis not present

## 2017-05-19 DIAGNOSIS — D126 Benign neoplasm of colon, unspecified: Secondary | ICD-10-CM | POA: Diagnosis not present

## 2017-05-25 DIAGNOSIS — D631 Anemia in chronic kidney disease: Secondary | ICD-10-CM | POA: Diagnosis not present

## 2017-05-25 DIAGNOSIS — N189 Chronic kidney disease, unspecified: Secondary | ICD-10-CM | POA: Diagnosis not present

## 2017-05-25 DIAGNOSIS — R809 Proteinuria, unspecified: Secondary | ICD-10-CM | POA: Diagnosis not present

## 2017-05-25 DIAGNOSIS — N184 Chronic kidney disease, stage 4 (severe): Secondary | ICD-10-CM | POA: Diagnosis not present

## 2017-05-25 DIAGNOSIS — N2581 Secondary hyperparathyroidism of renal origin: Secondary | ICD-10-CM | POA: Diagnosis not present

## 2017-05-25 DIAGNOSIS — I129 Hypertensive chronic kidney disease with stage 1 through stage 4 chronic kidney disease, or unspecified chronic kidney disease: Secondary | ICD-10-CM | POA: Diagnosis not present

## 2017-05-25 DIAGNOSIS — E785 Hyperlipidemia, unspecified: Secondary | ICD-10-CM | POA: Diagnosis not present

## 2017-05-25 DIAGNOSIS — E1122 Type 2 diabetes mellitus with diabetic chronic kidney disease: Secondary | ICD-10-CM | POA: Diagnosis not present

## 2017-06-17 DIAGNOSIS — D124 Benign neoplasm of descending colon: Secondary | ICD-10-CM | POA: Diagnosis not present

## 2017-06-17 DIAGNOSIS — K5901 Slow transit constipation: Secondary | ICD-10-CM | POA: Diagnosis not present

## 2017-06-24 ENCOUNTER — Ambulatory Visit (INDEPENDENT_AMBULATORY_CARE_PROVIDER_SITE_OTHER): Payer: PPO | Admitting: Neurology

## 2017-06-24 ENCOUNTER — Encounter: Payer: Self-pay | Admitting: Neurology

## 2017-06-24 VITALS — BP 130/62 | HR 71 | Ht 63.0 in | Wt 244.0 lb

## 2017-06-24 DIAGNOSIS — R569 Unspecified convulsions: Secondary | ICD-10-CM | POA: Diagnosis not present

## 2017-06-24 DIAGNOSIS — E1122 Type 2 diabetes mellitus with diabetic chronic kidney disease: Secondary | ICD-10-CM

## 2017-06-24 NOTE — Progress Notes (Signed)
NEUROLOGY FOLLOW UP OFFICE NOTE  Emily Hughes [id number]  HISTORY OF PRESENT ILLNESS: I had the pleasure of seeing Emily Hughes in follow-up in the neurology clinic on 06/24/2017.  The patient was last seen 9 months ago for new onset seizure last December 2016 in the setting of hyperglycemia. Her 24-hour EEG was normal. She self-reduced Depakote, and has been taking Depakote ER 500mg  qhs. She denies any seizures or seizure-like symptoms since December 2016. She denies any left arm jerking, staring/unresponsive episodes, gaps in time, or convulsions. She denies any headaches, dizziness, vision changes, focal numbness/tingling/weakness. Sugar levels are good. She has bad knees causing some unsteadiness, but denies any falls.   HPI: This is a pleasant 76 yo RH woman with a history of diabetes, hypertension, CKD, CAD, who was admitted to [facility] last 06/12/15 for new onset seizure. She was in the shower when she suddenly felt dizzy like she would pass out, then her left arm extended up and started jerking uncontrollably. She fell to the floor and lay in the shower, denying any loss of consciousness. She thought her left leg would not move, but it did, and was able to get out of the shower. Her sister called and she told her she was not feeling good. Family brought her to the ER where she was witnessed to have left arm stiffening and jerking followed by a generalized tonic-clonic seizure. Glucose on arrival was 505 with metabolic acidosis, and received treatment for DKA. Her CBC showed a WBC of 14.4, UDS negative. She reports that she had not yet eaten that morning and had not taken her insulin yet. She denied any focal weakness after the seizures. I personally reviewed MRI brain without contrast which did not show any acute changes, there was mild diffuse volume loss. Her routine wake and sleep EEG was normal. She was started on Vimpat 100mg  BID, however once she was discharged, found that her insurance  would not pay for Vimpat. She was started on Depakote 500mg  BID.   She denies any further seizures since hospital discharge. She denies any prior history of seizures, no headaches, further dizziness, diplopia, dysarthria, dysphagia, neck/back pain, focal numbness/tingling/weakness, bowel/bladder dysfunction. She denies any olfactory/gustatory hallucinations, deja vu, rising epigastric sensation. She used to have migraines but only has them infrequently now. She however does not like how Depakote makes her feel. She feels "crazy" on it, with a weird feeling of confusion, staggering when walking, poor balance. She has noticed she occasionally can't get her words out, "like my mind is trying to catch up." This is a new symptom for her. Dose was reduced by her PCP to 500mg  qhs 2 days ago. Her Depakote level on 500mg  BID was 49.2. She reports the staggering is better, but she still feels "weird" on the lower dose.  Epilepsy Risk Factors: She had a normal birth and early development. There is no history of febrile convulsions, CNS infections such as meningitis/encephalitis, significant traumatic brain injury, neurosurgical procedures, or family history of seizures.  PAST MEDICAL HISTORY: Past Medical History:  Diagnosis Date  . Anemia   . CAD (coronary artery disease)   . Colon polyps   . CRI (chronic renal insufficiency)   . DDD (degenerative disc disease)   . Diabetes mellitus    INSULIN DEPENDENT  . GERD (gastroesophageal reflux disease)   . High cholesterol   . Hypertension   . Pancreatitis   . Proteinuria     MEDICATIONS: Current Outpatient Medications on File Prior to  Visit  Medication Sig Dispense Refill  . allopurinol (ZYLOPRIM) 300 MG tablet TAKE 1 TABLET ONCE DAILY. 90 tablet 2  . amoxicillin (AMOXIL) 875 MG tablet Take 1 tablet (875 mg total) by mouth 2 (two) times daily. 20 tablet 0  . aspirin 81 MG tablet Take 81 mg by mouth daily.      . calcitRIOL (ROCALTROL) 0.25 MCG capsule  Take 0.25 mcg by mouth daily.    . cetirizine (ZYRTEC) 10 MG tablet Take 1 tablet (10 mg total) by mouth daily. 30 tablet 11  . cloNIDine (CATAPRES) 0.1 MG tablet Take 1 tablet (0.1 mg total) by mouth 2 (two) times daily. 180 tablet 3  . colchicine 0.6 MG tablet TAKE 2 TABLETS NOW AND REPEAT 1 TABLET IN 1 HOUR AS NEEDED FOR GOUT FLARE 30 tablet 0  . diclofenac sodium (VOLTAREN) 1 % GEL Apply 2 g topically 4 (four) times daily. 7 Tube 3  . diltiazem (CARDIZEM CD) 300 MG 24 hr capsule Take 1 capsule by mouth daily 90 capsule 2  . DIOVAN 160 MG tablet Take 160 mg by mouth 2 (two) times daily.    . divalproex (DEPAKOTE ER) 500 MG 24 hr tablet Take 1 tablet (500 mg total) by mouth at bedtime. 90 tablet 1  . ferrous fumarate (FERRO-SEQUELS) 50 MG CR tablet Take 50 mg by mouth daily.      . furosemide (LASIX) 40 MG tablet TAKE 2 TABLETS IN THE MORNING AND 1 TABLET IN THE EVENING. 270 tablet 1  . glimepiride (AMARYL) 4 MG tablet TAKE 1 TABLET ONCE DAILY. 30 tablet 3  . insulin aspart (NOVOLOG) 100 UNIT/ML FlexPen Inject 10 Units into the skin daily with lunch. (Patient taking differently: Inject 10 Units into the skin. Sliding Scale) 15 mL 3  . Insulin Glargine (LANTUS SOLOSTAR) 100 UNIT/ML Solostar Pen Inject 38 Units into the skin daily at 10 pm.    . levothyroxine (SYNTHROID, LEVOTHROID) 50 MCG tablet Take 1 tablet by mouth daily 90 tablet 2  . methocarbamol (ROBAXIN) 500 MG tablet Take 1 tablet (500 mg total) by mouth 2 (two) times daily. 20 tablet 0  . metoprolol succinate (TOPROL-XL) 25 MG 24 hr tablet Take 1 tablet by mouth daily 90 tablet 2  . mometasone (ELOCON) 0.1 % ointment APPLY TOPICALLY DAILY. 45 g 3  . omeprazole (PRILOSEC) 40 MG capsule Take 40 mg by mouth daily.     . pravastatin (PRAVACHOL) 80 MG tablet Take 1 tablet by mouth at bedtime 90 tablet 2  . sodium bicarbonate 650 MG tablet Take 1 tablet (650 mg total) by mouth 4 (four) times daily. 360 tablet 1  . sucralfate (CARAFATE) 1 G  tablet Take 1 tablet (1 g total) by mouth 4 (four) times daily -  with meals and at bedtime. 120 tablet 0  . UNABLE TO FIND Takes a potassium suspension daily     No current facility-administered medications on file prior to visit.     ALLERGIES: Allergies  Allergen Reactions  . Ace Inhibitors   . Actos [Pioglitazone Hydrochloride]   . Dust Mite Extract   . Dye Fdc Red [Erythrosine Red No. 3]     FAMILY HISTORY: Family History  Problem Relation Age of Onset  . Diabetes Mother   . Diabetes Father   . Diabetes Sister     SOCIAL HISTORY: Social History   Socioeconomic History  . Marital status: Widowed    Spouse name: Not on file  . Number of children:  Not on file  . Years of education: Not on file  . Highest education level: Not on file  Social Needs  . Financial resource strain: Not on file  . Food insecurity - worry: Not on file  . Food insecurity - inability: Not on file  . Transportation needs - medical: Not on file  . Transportation needs - non-medical: Not on file  Occupational History  . Occupation: Retired  Tobacco Use  . Smoking status: Former Research scientist (life sciences)  . Smokeless tobacco: Never Used  . Tobacco comment: quit smoking in 2000  Substance and Sexual Activity  . Alcohol use: No    Alcohol/week: 0.0 oz  . Drug use: No  . Sexual activity: Not on file  Other Topics Concern  . Not on file  Social History Narrative  . Not on file    REVIEW OF SYSTEMS: Constitutional: No fevers, chills, or sweats, no generalized fatigue, change in appetite Eyes: No visual changes, double vision, eye pain Ear, nose and throat: No hearing loss, ear pain, nasal congestion, sore throat Cardiovascular: No chest pain, palpitations Respiratory:  No shortness of breath at rest or with exertion, wheezes GastrointestinaI: No nausea, vomiting, diarrhea, abdominal pain, fecal incontinence Genitourinary:  No dysuria, urinary retention or frequency Musculoskeletal:  No neck pain, back  pain Integumentary: No rash, pruritus, skin lesions Neurological: as above Psychiatric: No depression, insomnia, anxiety Endocrine: No palpitations, fatigue, diaphoresis, mood swings, change in appetite, change in weight, increased thirst Hematologic/Lymphatic:  No anemia, purpura, petechiae. Allergic/Immunologic: no itchy/runny eyes, nasal congestion, recent allergic reactions, rashes  PHYSICAL EXAM: Vitals:   06/24/17 0820  BP: 130/62  Pulse: 71  SpO2: 96%   General: No acute distress Head:  Normocephalic/atraumatic Neck: supple, no paraspinal tenderness, full range of motion Heart:  Regular rate and rhythm Lungs:  Clear to auscultation bilaterally Back: No paraspinal tenderness Skin/Extremities: No rash, no edema Neurological Exam: alert and oriented to person, place, and time. No aphasia or dysarthria. Fund of knowledge is appropriate.  Recent and remote memory are intact.  Attention and concentration are normal.    Able to name objects and repeat phrases.  Cranial nerves: CN I: not tested CN II: pupils equal, round and reactive to light, visual fields intact CN III, IV, VI: full range of motion, no nystagmus, no ptosis CN V: facial sensation intact CN VII: upper and lower face symmetric CN VIII: hearing intact to finger rub CN IX, X: gag intact, uvula midline CN XI: sternocleidomastoid and trapezius muscles intact CN XII: tongue midline Bulk & Tone: normal, no fasciculations. Motor: 5/5 throughout with no pronator drift. Sensation: intact to light touch, cold, pin, vibration sense. No extinction to double simultaneous stimulation. Romberg test negative Deep Tendon Reflexes: +1 throughout, no ankle clonus Plantar responses: downgoing bilaterally Cerebellar: no incoordination on finger to nose testing Gait: slow and cautious, no ataxia Tremor: no resting tremor, +mild high frequency low amplitude postural and endpoint tremor bilaterally (L>R, similar to prior  visit)  IMPRESSION: This is a pleasant 76 yo RH woman with a history of hypertension, diabetes, CKD, with new onset seizures last 06/12/2015. She had 2 focal seizures that started with left arm jerking, one of which was witnessed to progress to a GTC. Her glucose level was reported to be in the 460s on admission. Focal motor seizures can be seen with hyperglycemia. She has no other clear epilepsy risk factors, neurological exam normal. Her MRI brain was unremarkable, routine and 24-hour EEG normal. Seizure possibly due to  hyperglycemia. She has been seizure-free for 2 years and expressed interest in tapering off medication. A repeat 1-hour EEG will be ordered, and if normal, we will start slowing tapering off Depakote. She understands risk for breakthrough seizure with any medication adjustment. Continue control of glucose. She is aware of [place] driving laws to stop driving after a seizure until 6 months seizure-free. She will follow-up in 6 months and knows to call for any changes.  Thank you for allowing me to participate in her care.  Please do not hesitate to call for any questions or concerns.  The duration of this appointment visit was 25 minutes of face-to-face time with the patient.  Greater than 50% of this time was spent in counseling, explanation of diagnosis, planning of further management, and coordination of care.   Emily Hughes, M.D.   CC: Dr. Dennard Schaumann

## 2017-06-24 NOTE — Patient Instructions (Signed)
1. Schedule 1-hour EEG 2. Continue Depakote ER 500mg  daily for now. Our office will call your with EEG results, and if they look okay, we will send in a prescription for Depakote 250mg : take 1 tablet daily for 2 weeks, then 1/2 tablet daily for 1 week, then stop medication 3. If any changes in symptoms, please call our office 4.Continue control of sugar levels 5. Follow-up in 6 months, call for any changes  Seizure Precautions: 1. If medication has been prescribed for you to prevent seizures, take it exactly as directed.  Do not stop taking the medicine without talking to your doctor first, even if you have not had a seizure in a long time.   2. Avoid activities in which a seizure would cause danger to yourself or to others.  Don't operate dangerous machinery, swim alone, or climb in high or dangerous places, such as on ladders, roofs, or girders.  Do not drive unless your doctor says you may.  3. If you have any warning that you may have a seizure, lay down in a safe place where you can't hurt yourself.    4.  No driving for 6 months from last seizure, as per [facility].   Please refer to the following link on the [facility] website for more information: [email or web address]   5.  Maintain good sleep hygiene. Avoid alcohol  6.  Contact your doctor if you have any problems that may be related to the medicine you are taking.  7.  Call 911 and bring the patient back to the ED if:        A.  The seizure lasts longer than 5 minutes.       B.  The patient doesn't awaken shortly after the seizure  C.  The patient has new problems such as difficulty seeing, speaking or moving  D.  The patient was injured during the seizure  E.  The patient has a temperature over 102 F (39C)  F.  The patient vomited and now is having trouble breathing

## 2017-06-30 ENCOUNTER — Ambulatory Visit (INDEPENDENT_AMBULATORY_CARE_PROVIDER_SITE_OTHER): Payer: PPO | Admitting: Neurology

## 2017-06-30 DIAGNOSIS — R569 Unspecified convulsions: Secondary | ICD-10-CM

## 2017-07-04 NOTE — Procedures (Signed)
ELECTROENCEPHALOGRAM REPORT  Date of Study: 06/30/2017  Patient's Name: Emily Hughes MRN: [id number] Date of Birth: October 11, 1940  Referring Provider: Dr. Ellouise Newer  Clinical History: This is a 76 year old woman with seizures in 2016 in the setting of significant hyperglycemia. She is interested in medication taper.  Medications: DEPAKOTE ER 500 MG ZYLOPRIM 300 MG tablet  AMOXIL 875 MG tablet aspirin 81 MG tablet  ROCALTROL 0.25 MCG capsule  ZYRTEC 10 MG tablet CATAPRES 0.1 MG tablet colchicine 0.6 MG tablet  VOLTAREN 1 % GEL A CARDIZEM CD 300 MG 24 hr capsule  DIOVAN 160 MG tablet   FERRO-SEQUELS 50 MG CR tablet  LASIX 40 MG tablet  AMARYL 4 MG tablet NOVOLOG 100 UNIT/ML FlexPen  LANTUS SOLOSTAR 100 UNIT/ML Solostar Pen   SYNTHROID, LEVOTHROID 50 MCG tablet  ROBAXIN 500 MG tablet  TOPROL-XL) 25 MG 24 hr tablet  ELOCON 0.1 % ointment  PRILOSEC 40 MG capsule  PRAVACHOL 80 MG tablet  sodium bicarbonate 650 MG tablet  CARAFATE 1 G tablet  Technical Summary: A multichannel digital 1-hour EEG recording measured by the international 10-20 system with electrodes applied with paste and impedances below 5000 ohms performed in our laboratory with EKG monitoring in an awake and asleep patient.  Hyperventilation was not performed. Photic stimulation was performed.  The digital EEG was referentially recorded, reformatted, and digitally filtered in a variety of bipolar and referential montages for optimal display.    Description: The patient is awake and asleep during the recording.  There is no clear posterior dominant rhythm. The record is symmetric.  During drowsiness and sleep, there is an increase in theta slowing of the background.  Vertex waves and symmetric sleep spindles were seen.  Photic stimulation did not elicit any abnormalities.  EKG artifact is seen over the temporal chains. There were no epileptiform discharges or electrographic seizures seen.    EKG lead was  unremarkable.  Impression: This 1-hour awake and asleep EEG is normal.    Clinical Correlation: A normal EEG does not exclude a clinical diagnosis of epilepsy.  If further clinical questions remain, prolonged EEG may be helpful.  Clinical correlation is advised.   Ellouise Newer, M.D.

## 2017-07-06 ENCOUNTER — Other Ambulatory Visit: Payer: Self-pay | Admitting: Neurology

## 2017-07-06 ENCOUNTER — Other Ambulatory Visit: Payer: Self-pay | Admitting: Family Medicine

## 2017-07-06 MED ORDER — DIVALPROEX SODIUM 250 MG PO DR TAB: DELAYED_RELEASE_TABLET | ORAL | 0 refills | Status: DC

## 2017-07-07 ENCOUNTER — Telehealth: Payer: Self-pay

## 2017-07-07 NOTE — Telephone Encounter (Signed)
-----   Message from Cameron Sprang, MD sent at 07/06/2017 12:32 PM EST ----- Pls let her know the EEG is normal. We can start slowly reducing Depakote, pls let her know a prescription for the lower dose Depakote with instructions has been sent to her pharmacy. Thanks

## 2017-07-07 NOTE — Telephone Encounter (Signed)
Spoke with pt relaying message below.   

## 2017-07-12 ENCOUNTER — Ambulatory Visit (INDEPENDENT_AMBULATORY_CARE_PROVIDER_SITE_OTHER): Payer: PPO | Admitting: Family Medicine

## 2017-07-12 ENCOUNTER — Encounter: Payer: Self-pay | Admitting: Family Medicine

## 2017-07-12 VITALS — BP 150/80 | HR 62 | Temp 98.6°F | Resp 18 | Ht 63.0 in

## 2017-07-12 DIAGNOSIS — E1122 Type 2 diabetes mellitus with diabetic chronic kidney disease: Secondary | ICD-10-CM | POA: Diagnosis not present

## 2017-07-12 DIAGNOSIS — N186 End stage renal disease: Secondary | ICD-10-CM | POA: Diagnosis not present

## 2017-07-12 DIAGNOSIS — N184 Chronic kidney disease, stage 4 (severe): Secondary | ICD-10-CM | POA: Diagnosis not present

## 2017-07-12 NOTE — Progress Notes (Signed)
Subjective:    Patient ID: Emily Hughes, female    DOB: 1940-12-18, 77 y.o.   MRN: [id number]  HPI Patient is here today at my request for follow-up of her diabetes. She is currently on 38 units of Lantus once daily and 10 units of NovoLog at lunch. She is also taking glimepiride. Her last hemoglobin A1c last fall was 6.8 indicating excellent control. However she has stage IV chronic kidney disease. Her renal function has slightly worsened recently and as a result, the patient is starting to experience episodes of hypoglycemia. The primarily occur in the morning after she has gone all night long without eating. Her sugars to be in the 70s or 80s. She seldom has a episodes of hypoglycemia later in the day. She denies any polyuria, polydipsia, blurred vision. She denies any chest pain shortness of breath or dyspnea on exertion. She follows up with her nephrologist every 3 months to monitor her kidney function but she was decided absolutely against pursuing dialysis should her renal function worsened. She realizes that this would be fatal however she states that she has no desire to prolong life with dialysis. Past Medical History:  Diagnosis Date  . Anemia   . CAD (coronary artery disease)   . Colon polyps   . CRI (chronic renal insufficiency)   . DDD (degenerative disc disease)   . Diabetes mellitus    INSULIN DEPENDENT  . GERD (gastroesophageal reflux disease)   . High cholesterol   . Hypertension   . Pancreatitis   . Proteinuria    Past Surgical History:  Procedure Laterality Date  . ABDOMINAL HYSTERECTOMY    . ANGIOPLASTY    . ANKLE SURGERY Right   . BACK SURGERY    . TONSILLECTOMY     Current Outpatient Medications on File Prior to Visit  Medication Sig Dispense Refill  . allopurinol (ZYLOPRIM) 300 MG tablet TAKE 1 TABLET ONCE DAILY. 90 tablet 2  . aspirin 81 MG tablet Take 81 mg by mouth daily.      . calcitRIOL (ROCALTROL) 0.25 MCG capsule Take 0.25 mcg by mouth daily.      . cetirizine (ZYRTEC) 10 MG tablet Take 1 tablet (10 mg total) by mouth daily. 30 tablet 11  . cloNIDine (CATAPRES) 0.1 MG tablet Take 1 tablet (0.1 mg total) by mouth 2 (two) times daily. 180 tablet 3  . colchicine 0.6 MG tablet TAKE 2 TABLETS NOW AND REPEAT 1 TABLET IN 1 HOUR AS NEEDED FOR GOUT FLARE 30 tablet 0  . diclofenac sodium (VOLTAREN) 1 % GEL Apply 2 g topically 4 (four) times daily. 7 Tube 3  . diltiazem (CARDIZEM CD) 300 MG 24 hr capsule Take 1 capsule by mouth daily 90 capsule 2  . DIOVAN 160 MG tablet Take 160 mg by mouth 2 (two) times daily.    . divalproex (DEPAKOTE) 250 MG DR tablet Take 1 tablet every night for 2 weeks, then 1/2 tablet every night for 1 week, then stop medication 21 tablet 0  . ferrous fumarate (FERRO-SEQUELS) 50 MG CR tablet Take 50 mg by mouth daily.      . furosemide (LASIX) 40 MG tablet TAKE 2 TABLETS IN THE MORNING AND 1 TABLET IN THE EVENING. 270 tablet 1  . glimepiride (AMARYL) 4 MG tablet TAKE 1 TABLET ONCE DAILY. 30 tablet 3  . insulin aspart (NOVOLOG) 100 UNIT/ML FlexPen Inject 10 Units into the skin daily with lunch. (Patient taking differently: Inject 10 Units into the skin. Sliding  Scale) 15 mL 3  . LANTUS SOLOSTAR 100 UNIT/ML Solostar Pen INJECT 0.38ML INTO THE SKIN DAILY. 15 mL 1  . levothyroxine (SYNTHROID, LEVOTHROID) 50 MCG tablet Take 1 tablet by mouth daily 90 tablet 2  . methocarbamol (ROBAXIN) 500 MG tablet Take 1 tablet (500 mg total) by mouth 2 (two) times daily. 20 tablet 0  . metoprolol succinate (TOPROL-XL) 25 MG 24 hr tablet Take 1 tablet by mouth daily 90 tablet 2  . mometasone (ELOCON) 0.1 % ointment APPLY TOPICALLY DAILY. 45 g 3  . omeprazole (PRILOSEC) 40 MG capsule Take 40 mg by mouth daily.     . pravastatin (PRAVACHOL) 80 MG tablet Take 1 tablet by mouth at bedtime 90 tablet 2  . sodium bicarbonate 650 MG tablet Take 1 tablet (650 mg total) by mouth 4 (four) times daily. 360 tablet 1  . sucralfate (CARAFATE) 1 G tablet Take 1  tablet (1 g total) by mouth 4 (four) times daily -  with meals and at bedtime. 120 tablet 0  . UNABLE TO FIND Takes a potassium suspension daily     No current facility-administered medications on file prior to visit.    Allergies  Allergen Reactions  . Ace Inhibitors   . Actos [Pioglitazone Hydrochloride]   . Dust Mite Extract   . Dye Fdc Red [Erythrosine Red No. 3]    Social History   Socioeconomic History  . Marital status: Widowed    Spouse name: Not on file  . Number of children: Not on file  . Years of education: Not on file  . Highest education level: Not on file  Social Needs  . Financial resource strain: Not on file  . Food insecurity - worry: Not on file  . Food insecurity - inability: Not on file  . Transportation needs - medical: Not on file  . Transportation needs - non-medical: Not on file  Occupational History  . Occupation: Retired  Tobacco Use  . Smoking status: Former Research scientist (life sciences)  . Smokeless tobacco: Never Used  . Tobacco comment: quit smoking in 2000  Substance and Sexual Activity  . Alcohol use: No    Alcohol/week: 0.0 oz  . Drug use: No  . Sexual activity: Not on file  Other Topics Concern  . Not on file  Social History Narrative  . Not on file      Review of Systems  All other systems reviewed and are negative.      Objective:   Physical Exam  Constitutional: She appears well-developed and well-nourished.  Neck: No JVD present. No thyromegaly present.  Cardiovascular: Normal rate, regular rhythm, normal heart sounds and intact distal pulses.  Pulmonary/Chest: Effort normal and breath sounds normal. No respiratory distress. She has no wheezes. She has no rales.  Abdominal: Soft. Bowel sounds are normal. She exhibits no distension. There is no tenderness. There is no rebound.  Musculoskeletal: She exhibits no edema.  Vitals reviewed.         Assessment & Plan:  Type II diabetes mellitus with end-stage renal disease ([place]) - Plan:  Hemoglobin A1c, COMPLETE METABOLIC PANEL WITH GFR, Lipid panel  Chronic kidney disease (CKD), stage IV (severe) ([facility])  Decrease Lantus from 38 units a day to 30 units a day. Decreased rapid acting insulin from 10 units to 5 units at lunch. Discontinue glimepiride. I will be willing to allow permissive hyperglycemia between 150 and 200 for patient safety given her history of seizure disorder and high risk of hypoglycemia due to  her worsening kidney disease. Check hemoglobin A1c, CMP, and fasting lipid panel. Goal LDL cholesterol is less than 100. Blood pressure today is slightly elevated.  I've asked the patient to monitor her blood pressure more closely at home and notify us of his consistently greater than 140/92 we can up titrate her medication

## 2017-07-13 LAB — COMPLETE METABOLIC PANEL WITH GFR
AG RATIO: 1.2 (calc) (ref 1.0–2.5)
ALKALINE PHOSPHATASE (APISO): 56 U/L (ref 33–130)
ALT: 13 U/L (ref 6–29)
AST: 16 U/L (ref 10–35)
Albumin: 3.8 g/dL (ref 3.6–5.1)
BUN/Creatinine Ratio: 23 (calc) — ABNORMAL HIGH (ref 6–22)
BUN: 89 mg/dL — ABNORMAL HIGH (ref 7–25)
CO2: 26 mmol/L (ref 20–32)
Calcium: 9.9 mg/dL (ref 8.6–10.4)
Chloride: 102 mmol/L (ref 98–110)
Creat: 3.95 mg/dL — ABNORMAL HIGH (ref 0.60–0.93)
GFR, Est African American: 12 mL/min/{1.73_m2} — ABNORMAL LOW (ref >=60)
GFR, Est Non African American: 11 mL/min/{1.73_m2} — ABNORMAL LOW (ref >=60)
GLOBULIN: 3.3 g/dL (ref 1.9–3.7)
Glucose, Bld: 92 mg/dL (ref 65–99)
POTASSIUM: 5.2 mmol/L (ref 3.5–5.3)
SODIUM: 139 mmol/L (ref 135–146)
Total Bilirubin: 0.4 mg/dL (ref 0.2–1.2)
Total Protein: 7.1 g/dL (ref 6.1–8.1)

## 2017-07-13 LAB — LIPID PANEL
CHOLESTEROL: 120 mg/dL (ref <200)
HDL: 35 mg/dL — AB (ref >50)
LDL Cholesterol (Calc): 64 mg/dL (calc)
Non-HDL Cholesterol (Calc): 85 mg/dL (calc) (ref <130)
TRIGLYCERIDES: 119 mg/dL (ref <150)
Total CHOL/HDL Ratio: 3.4 (calc) (ref <5.0)

## 2017-07-13 LAB — HEMOGLOBIN A1C
Hgb A1c MFr Bld: 7.2 % of total Hgb — ABNORMAL HIGH (ref <5.7)
Mean Plasma Glucose: 160 (calc)
eAG (mmol/L): 8.9 (calc)

## 2017-07-21 ENCOUNTER — Telehealth: Payer: Self-pay | Admitting: Neurology

## 2017-07-21 NOTE — Telephone Encounter (Signed)
Spoke with pt.  She states that Rx was never sent.  I let her know that I already confirmed with pharmacy that Rx was sent and that she had already picked it up.  Pt states that she has been "taking the old does this whole time".  I asked pt to get her bottle and read the label to me.  Pt states "take one tablet every night" I asked if she was reading from the bottle.  Pt states that she was not, that her bottle was at home.  I advised that I would like for her to read the label to me to help me figure out if she is in fact taking the current depakote, but not the current dosage.  Pt states that she will call me tomorrow

## 2017-07-21 NOTE — Telephone Encounter (Signed)
Spoke with [facility] with [place] who states that pt picked up all of her medications on 07/06/17.  Per last phone encounter, pt was to taper off of depakote.

## 2017-07-21 NOTE — Telephone Encounter (Signed)
Pt called and said the pharmacy has not received her prescription request for her seizure medication yet

## 2017-07-22 ENCOUNTER — Telehealth: Payer: Self-pay | Admitting: Neurology

## 2017-07-22 NOTE — Telephone Encounter (Signed)
Please remind her that when I saw her in the office, we had discussed slowly reducing and then stopping the medication if the brain wave test was normal. It was normal, so at the beginning of the month I sent in a prescription for Depakote 250mg  tablets with instructions on reducing dose to 1 tablet daily for 2 weeks, then 1 tablet daily for 1 week, then stop. It sounds like she did not get this bottle? Does she want to continue on the medication or stay on it? If she wants to continue, then pls send refills for the 500mg  daily. If she wants to start reducing medication, then pls re-send the Rx from 07/06/17. Thanks!!

## 2017-07-22 NOTE — Telephone Encounter (Signed)
Pt returned my call while I was at lunch.     Returned pt's call.  She had what she says is her only bottle of Depakote in hand.  Sig on that bottle is: take one tablet every night.  Asked pt the dosage she says 500mg .  I advised that her pharmacy told me that she had picked up all of her medications.  She states that the pharmacy lied to me.  Please advise if I should send repeat of last Rx.

## 2017-07-22 NOTE — Telephone Encounter (Signed)
Patient Lmom for you to please call her.Thanks

## 2017-08-08 ENCOUNTER — Other Ambulatory Visit: Payer: Self-pay | Admitting: Family Medicine

## 2017-08-09 ENCOUNTER — Ambulatory Visit (INDEPENDENT_AMBULATORY_CARE_PROVIDER_SITE_OTHER): Payer: PPO | Admitting: Family Medicine

## 2017-08-09 VITALS — BP 128/64 | HR 66 | Temp 98.4°F | Resp 16 | Ht 63.0 in | Wt 241.0 lb

## 2017-08-09 DIAGNOSIS — M5432 Sciatica, left side: Secondary | ICD-10-CM

## 2017-08-09 MED ORDER — HYDROCODONE-ACETAMINOPHEN 5-325 MG PO TABS: 1.0000 | ORAL_TABLET | Freq: Four times a day (QID) | ORAL | 0 refills | Status: DC | PRN

## 2017-08-09 NOTE — Progress Notes (Signed)
Subjective:    Patient ID: Emily Hughes, female    DOB: July 22, 1940, 77 y.o.   MRN: [id number]  HPI 1 week ago, the patient developed low back pain.  The pain radiates into her left gluteus down her left thigh into her left calf muscle and into her left foot.  She reports numbness and tingling in the left foot.  She reports pins and needles neuropathic pain in her lateral left calf as well as in her lateral left thigh.  She reports subjective numbness and weakness in the left leg.  Muscle strength is normal 5/5 and symmetric in both lower extremities.  However she has diminished patellar reflex as well as ankle jerk reflex.  She has normal sensation to 10 g monofilament in the feet. Past Medical History:  Diagnosis Date  . Anemia   . CAD (coronary artery disease)   . Colon polyps   . CRI (chronic renal insufficiency)   . DDD (degenerative disc disease)   . Diabetes mellitus    INSULIN DEPENDENT  . GERD (gastroesophageal reflux disease)   . High cholesterol   . Hypertension   . Pancreatitis   . Proteinuria    Past Surgical History:  Procedure Laterality Date  . ABDOMINAL HYSTERECTOMY    . ANGIOPLASTY    . ANKLE SURGERY Right   . BACK SURGERY    . TONSILLECTOMY     Current Outpatient Medications on File Prior to Visit  Medication Sig Dispense Refill  . allopurinol (ZYLOPRIM) 300 MG tablet TAKE 1 TABLET ONCE DAILY. 90 tablet 2  . aspirin 81 MG tablet Take 81 mg by mouth daily.      . calcitRIOL (ROCALTROL) 0.25 MCG capsule Take 0.25 mcg by mouth daily.    . cetirizine (ZYRTEC) 10 MG tablet Take 1 tablet (10 mg total) by mouth daily. 30 tablet 11  . cloNIDine (CATAPRES) 0.1 MG tablet Take 1 tablet (0.1 mg total) by mouth 2 (two) times daily. 180 tablet 3  . colchicine 0.6 MG tablet TAKE 2 TABLETS NOW AND REPEAT 1 TABLET IN 1 HOUR AS NEEDED FOR GOUT FLARE 30 tablet 3  . diclofenac sodium (VOLTAREN) 1 % GEL Apply 2 g topically 4 (four) times daily. 7 Tube 3  . diltiazem (CARDIZEM  CD) 300 MG 24 hr capsule Take 1 capsule by mouth daily 90 capsule 2  . DIOVAN 160 MG tablet Take 160 mg by mouth 2 (two) times daily.    . divalproex (DEPAKOTE) 250 MG DR tablet Take 1 tablet every night for 2 weeks, then 1/2 tablet every night for 1 week, then stop medication 21 tablet 0  . ferrous fumarate (FERRO-SEQUELS) 50 MG CR tablet Take 50 mg by mouth daily.      . furosemide (LASIX) 40 MG tablet TAKE 2 TABLETS IN THE MORNING AND 1 TABLET IN THE EVENING. 270 tablet 1  . glimepiride (AMARYL) 4 MG tablet TAKE 1 TABLET ONCE DAILY. 30 tablet 3  . insulin aspart (NOVOLOG) 100 UNIT/ML FlexPen Inject 10 Units into the skin daily with lunch. (Patient taking differently: Inject 10 Units into the skin. Sliding Scale) 15 mL 3  . LANTUS SOLOSTAR 100 UNIT/ML Solostar Pen INJECT 0.38ML INTO THE SKIN DAILY. 15 mL 1  . levothyroxine (SYNTHROID, LEVOTHROID) 50 MCG tablet Take 1 tablet by mouth daily 90 tablet 2  . metoprolol succinate (TOPROL-XL) 25 MG 24 hr tablet Take 1 tablet by mouth daily 90 tablet 2  . mometasone (ELOCON) 0.1 % ointment APPLY  TOPICALLY DAILY. 45 g 3  . omeprazole (PRILOSEC) 40 MG capsule Take 40 mg by mouth daily.     . pravastatin (PRAVACHOL) 80 MG tablet Take 1 tablet by mouth at bedtime 90 tablet 2  . sodium bicarbonate 650 MG tablet Take 1 tablet (650 mg total) by mouth 4 (four) times daily. 360 tablet 1  . sucralfate (CARAFATE) 1 G tablet Take 1 tablet (1 g total) by mouth 4 (four) times daily -  with meals and at bedtime. 120 tablet 0  . UNABLE TO FIND Takes a potassium suspension daily     No current facility-administered medications on file prior to visit.    Allergies  Allergen Reactions  . Ace Inhibitors   . Actos [Pioglitazone Hydrochloride]   . Dust Mite Extract   . Dye Fdc Red [Erythrosine Red No. 3]    Social History   Socioeconomic History  . Marital status: Widowed    Spouse name: Not on file  . Number of children: Not on file  . Years of education: Not  on file  . Highest education level: Not on file  Social Needs  . Financial resource strain: Not on file  . Food insecurity - worry: Not on file  . Food insecurity - inability: Not on file  . Transportation needs - medical: Not on file  . Transportation needs - non-medical: Not on file  Occupational History  . Occupation: Retired  Tobacco Use  . Smoking status: Former Research scientist (life sciences)  . Smokeless tobacco: Never Used  . Tobacco comment: quit smoking in 2000  Substance and Sexual Activity  . Alcohol use: No    Alcohol/week: 0.0 oz  . Drug use: No  . Sexual activity: Not on file  Other Topics Concern  . Not on file  Social History Narrative  . Not on file      Review of Systems  All other systems reviewed and are negative.      Objective:   Physical Exam  Cardiovascular: Normal rate, regular rhythm and normal heart sounds.  Pulmonary/Chest: Effort normal and breath sounds normal.  Musculoskeletal:       Lumbar back: She exhibits decreased range of motion and tenderness.       Left upper leg: She exhibits no tenderness, no bony tenderness and no swelling.       Legs: Vitals reviewed.         Assessment & Plan:  Left sided sciatica - Plan: DG Lumbar Spine Complete  Symptoms are concerning for left-sided sciatica.  Begin by obtaining an x-ray of the lumbar spine.  Given the patient's chronic kidney disease, MRI with contrast carries risk associated with it.  Patient is unable to take NSAIDs due to her insulin-dependent diabetes, prednisone is risky.  Therefore we will treat the patient symptomatically with Norco 5/325 1 p.o. every 6 hours as needed pain and allow tincture of time over the next 7-14 days.  If symptoms are gradually improving on their own no further workup will be necessary.  If symptoms worsen, she may need to consider epidural steroid injections for that we would have to proceed with an MRI of the lumbar spine.

## 2017-08-11 ENCOUNTER — Ambulatory Visit
Admission: RE | Admit: 2017-08-11 | Discharge: 2017-08-11 | Disposition: A | Discharge status: Home / Self Care | Payer: PPO | Source: Ambulatory Visit | Attending: Family Medicine | Admitting: Family Medicine

## 2017-08-11 DIAGNOSIS — M5432 Sciatica, left side: Secondary | ICD-10-CM

## 2017-08-11 DIAGNOSIS — S3992XA Unspecified injury of lower back, initial encounter: Secondary | ICD-10-CM | POA: Diagnosis not present

## 2017-08-11 DIAGNOSIS — M545 Low back pain: Secondary | ICD-10-CM | POA: Diagnosis not present

## 2017-08-24 DIAGNOSIS — R569 Unspecified convulsions: Secondary | ICD-10-CM | POA: Diagnosis not present

## 2017-08-24 DIAGNOSIS — N2581 Secondary hyperparathyroidism of renal origin: Secondary | ICD-10-CM | POA: Diagnosis not present

## 2017-08-24 DIAGNOSIS — E1122 Type 2 diabetes mellitus with diabetic chronic kidney disease: Secondary | ICD-10-CM | POA: Diagnosis not present

## 2017-08-24 DIAGNOSIS — N184 Chronic kidney disease, stage 4 (severe): Secondary | ICD-10-CM | POA: Diagnosis not present

## 2017-08-24 DIAGNOSIS — I129 Hypertensive chronic kidney disease with stage 1 through stage 4 chronic kidney disease, or unspecified chronic kidney disease: Secondary | ICD-10-CM | POA: Diagnosis not present

## 2017-08-24 DIAGNOSIS — N189 Chronic kidney disease, unspecified: Secondary | ICD-10-CM | POA: Diagnosis not present

## 2017-08-24 DIAGNOSIS — D631 Anemia in chronic kidney disease: Secondary | ICD-10-CM | POA: Diagnosis not present

## 2017-09-27 ENCOUNTER — Encounter: Payer: Self-pay | Admitting: Family Medicine

## 2017-09-27 ENCOUNTER — Ambulatory Visit (INDEPENDENT_AMBULATORY_CARE_PROVIDER_SITE_OTHER): Payer: PPO | Admitting: Family Medicine

## 2017-09-27 VITALS — BP 140/68 | HR 80 | Temp 98.1°F | Resp 12 | Ht 63.0 in | Wt 237.0 lb

## 2017-09-27 DIAGNOSIS — M5432 Sciatica, left side: Secondary | ICD-10-CM

## 2017-09-27 DIAGNOSIS — M4310 Spondylolisthesis, site unspecified: Secondary | ICD-10-CM

## 2017-09-27 MED ORDER — TIZANIDINE HCL 2 MG PO CAPS: 2.0000 mg | ORAL_CAPSULE | Freq: Three times a day (TID) | ORAL | 0 refills | Status: DC | PRN

## 2017-09-27 NOTE — Progress Notes (Signed)
Subjective:    Patient ID: Emily Hughes, female    DOB: [birth year], 77 y.o.   MRN: [id number]  HPI  08/09/17 1 week ago, the patient developed low back pain.  The pain radiates into her left gluteus down her left thigh into her left calf muscle and into her left foot.  She reports numbness and tingling in the left foot.  She reports pins and needles neuropathic pain in her lateral left calf as well as in her lateral left thigh.  She reports subjective numbness and weakness in the left leg.  Muscle strength is normal 5/5 and symmetric in both lower extremities.  However she has diminished patellar reflex as well as ankle jerk reflex.  She has normal sensation to 10 g monofilament in the feet.  At that time, my plan was: Symptoms are concerning for left-sided sciatica.  Begin by obtaining an x-ray of the lumbar spine.  Given the patient's chronic kidney disease, MRI with contrast carries risk associated with it.  Patient is unable to take NSAIDs due to her insulin-dependent diabetes, prednisone is risky.  Therefore we will treat the patient symptomatically with Norco 5/325 1 p.o. every 6 hours as needed pain and allow tincture of time over the next 7-14 days.  If symptoms are gradually improving on their own no further workup will be necessary.  If symptoms worsen, she may need to consider epidural steroid injections for that we would have to proceed with an MRI of the lumbar spine.   Xray revealed: There is significant facet hypertrophy throughout the lower lumbar levels. There is 12 millimeters anterolisthesis of L4 on L5. There is 5 millimeters retrolisthesis of L3 on L4 which reduces depending on patient position. Suspect uncovertebral spurring at L3-4. No suspicious lytic or blastic lesions are identified.  09/27/17 Patient is now 6 weeks out and continues to have pain radiating down her left leg.  She states that her left foot is numb.  She has normal muscle strength in the left leg.  She is  able to walk using a cane.  However standing exacerbates the pain.  She reports cramping pain in her left hamstring at rest and with activity.  She reports muscle spasms that occur intermittently in the left leg.  She also reports burning stinging pain radiating from her gluteus down her left leg into her foot.  The pain in her lower back however has improved. Past Medical History:  Diagnosis Date  . Anemia   . CAD (coronary artery disease)   . Colon polyps   . CRI (chronic renal insufficiency)   . DDD (degenerative disc disease)   . Diabetes mellitus    INSULIN DEPENDENT  . GERD (gastroesophageal reflux disease)   . High cholesterol   . Hypertension   . Pancreatitis   . Proteinuria    Past Surgical History:  Procedure Laterality Date  . ABDOMINAL HYSTERECTOMY    . ANGIOPLASTY    . ANKLE SURGERY Right   . BACK SURGERY    . TONSILLECTOMY     Current Outpatient Medications on File Prior to Visit  Medication Sig Dispense Refill  . allopurinol (ZYLOPRIM) 300 MG tablet TAKE 1 TABLET ONCE DAILY. 90 tablet 2  . aspirin 81 MG tablet Take 81 mg by mouth daily.      . calcitRIOL (ROCALTROL) 0.25 MCG capsule Take 0.25 mcg by mouth daily.    . cetirizine (ZYRTEC) 10 MG tablet Take 1 tablet (10 mg total) by mouth daily. Point  tablet 11  . cloNIDine (CATAPRES) 0.1 MG tablet Take 1 tablet (0.1 mg total) by mouth 2 (two) times daily. 180 tablet 3  . colchicine 0.6 MG tablet TAKE 2 TABLETS NOW AND REPEAT 1 TABLET IN 1 HOUR AS NEEDED FOR GOUT FLARE 30 tablet 3  . diclofenac sodium (VOLTAREN) 1 % GEL Apply 2 g topically 4 (four) times daily. 7 Tube 3  . diltiazem (CARDIZEM CD) 300 MG 24 hr capsule Take 1 capsule by mouth daily 90 capsule 2  . DIOVAN 160 MG tablet Take 160 mg by mouth 2 (two) times daily.    . divalproex (DEPAKOTE) 250 MG DR tablet Take 1 tablet every night for 2 weeks, then 1/2 tablet every night for 1 week, then stop medication 21 tablet 0  . ferrous fumarate (FERRO-SEQUELS) 50 MG CR  tablet Take 50 mg by mouth daily.      . furosemide (LASIX) 40 MG tablet TAKE 2 TABLETS IN THE MORNING AND 1 TABLET IN THE EVENING. 270 tablet 1  . glimepiride (AMARYL) 4 MG tablet TAKE 1 TABLET ONCE DAILY. 30 tablet 3  . HYDROcodone-acetaminophen (NORCO) 5-325 MG tablet Take 1 tablet by mouth every 6 (six) hours as needed for moderate pain. 30 tablet 0  . insulin aspart (NOVOLOG) 100 UNIT/ML FlexPen Inject 10 Units into the skin daily with lunch. (Patient taking differently: Inject 10 Units into the skin. Sliding Scale) 15 mL 3  . LANTUS SOLOSTAR 100 UNIT/ML Solostar Pen INJECT 0.38ML INTO THE SKIN DAILY. (Patient taking differently: INJECT 30 units INTO THE SKIN DAILY.) 15 mL 1  . levothyroxine (SYNTHROID, LEVOTHROID) 50 MCG tablet Take 1 tablet by mouth daily 90 tablet 2  . metoprolol succinate (TOPROL-XL) 25 MG 24 hr tablet Take 1 tablet by mouth daily 90 tablet 2  . mometasone (ELOCON) 0.1 % ointment APPLY TOPICALLY DAILY. 45 g 3  . omeprazole (PRILOSEC) 40 MG capsule Take 40 mg by mouth daily.     . pravastatin (PRAVACHOL) 80 MG tablet Take 1 tablet by mouth at bedtime 90 tablet 2  . sodium bicarbonate 650 MG tablet Take 1 tablet (650 mg total) by mouth 4 (four) times daily. 360 tablet 1  . sucralfate (CARAFATE) 1 G tablet Take 1 tablet (1 g total) by mouth 4 (four) times daily -  with meals and at bedtime. 120 tablet 0  . UNABLE TO FIND Takes a potassium suspension daily     No current facility-administered medications on file prior to visit.    Allergies  Allergen Reactions  . Ace Inhibitors   . Actos [Pioglitazone Hydrochloride]   . Dust Mite Extract   . Dye Fdc Red [Erythrosine Red No. 3]    Social History   Socioeconomic History  . Marital status: Widowed    Spouse name: Not on file  . Number of children: Not on file  . Years of education: Not on file  . Highest education level: Not on file  Occupational History  . Occupation: Retired  Scientific laboratory technician  . Financial resource  strain: Not on file  . Food insecurity:    Worry: Not on file    Inability: Not on file  . Transportation needs:    Medical: Not on file    Non-medical: Not on file  Tobacco Use  . Smoking status: Former Research scientist (life sciences)  . Smokeless tobacco: Never Used  . Tobacco comment: quit smoking in 2000  Substance and Sexual Activity  . Alcohol use: No    Alcohol/week:  0.0 oz  . Drug use: No  . Sexual activity: Not on file  Lifestyle  . Physical activity:    Days per week: Not on file    Minutes per session: Not on file  . Stress: Not on file  Relationships  . Social connections:    Talks on phone: Not on file    Gets together: Not on file    Attends religious service: Not on file    Active member of club or organization: Not on file    Attends meetings of clubs or organizations: Not on file    Relationship status: Not on file  . Intimate partner violence:    Fear of current or ex partner: Not on file    Emotionally abused: Not on file    Physically abused: Not on file    Forced sexual activity: Not on file  Other Topics Concern  . Not on file  Social History Narrative  . Not on file      Review of Systems  All other systems reviewed and are negative.      Objective:   Physical Exam  Cardiovascular: Normal rate, regular rhythm and normal heart sounds.  Pulmonary/Chest: Effort normal and breath sounds normal.  Musculoskeletal:       Lumbar back: She exhibits decreased range of motion and tenderness.       Left upper leg: She exhibits no tenderness, no bony tenderness and no swelling.       Legs: Vitals reviewed.         Assessment & Plan:  Left sided sciatica - Plan: Ambulatory referral to Orthopedic Surgery  Anterolisthesis - Plan: Ambulatory referral to Orthopedic Surgery  Patient has impressive anterior listhesis of L4 on L5 with resultant foraminal stenosis on x-ray.  I am suspicious that that is the source of her sciatica.  I will treat the muscle spasms  subjectively with Zanaflex 2 mg every 8 hours as needed.  I cautioned the patient about sedation and dizziness on that medication.  She is unable to take NSAIDs due to chronic kidney disease.  I will consult orthopedics and allow them to determine if she is a candidate for an epidural steroid injection.  Patient is a poor surgical candidate given her medical comorbidities but I believe an epidural steroid injection may help her pain.

## 2017-09-29 ENCOUNTER — Encounter (INDEPENDENT_AMBULATORY_CARE_PROVIDER_SITE_OTHER): Payer: Self-pay | Admitting: Surgery

## 2017-09-29 ENCOUNTER — Other Ambulatory Visit: Payer: Self-pay | Admitting: Family Medicine

## 2017-09-29 ENCOUNTER — Ambulatory Visit (INDEPENDENT_AMBULATORY_CARE_PROVIDER_SITE_OTHER): Payer: PPO

## 2017-09-29 ENCOUNTER — Ambulatory Visit (INDEPENDENT_AMBULATORY_CARE_PROVIDER_SITE_OTHER): Payer: PPO | Admitting: Surgery

## 2017-09-29 VITALS — BP 146/79 | HR 77 | Ht 63.0 in | Wt 238.0 lb

## 2017-09-29 DIAGNOSIS — M545 Low back pain: Secondary | ICD-10-CM | POA: Diagnosis not present

## 2017-09-29 DIAGNOSIS — M4316 Spondylolisthesis, lumbar region: Secondary | ICD-10-CM | POA: Diagnosis not present

## 2017-09-29 MED ORDER — TIZANIDINE HCL 2 MG PO TABS: 2.0000 mg | ORAL_TABLET | Freq: Three times a day (TID) | ORAL | 1 refills | Status: DC

## 2017-09-29 NOTE — Progress Notes (Signed)
Office Visit Note   Patient: Emily Hughes           Date of Birth: [birth year]           MRN: [id number] Visit Date: 09/29/2017              Requested by: Susy Frizzle, MD [id number] [facility] [place], [place] [address] PCP: Susy Frizzle, MD   Assessment & Plan: Visit Diagnoses:  1. Low back pain, unspecified back pain laterality, unspecified chronicity, with sciatica presence unspecified   2. Spondylolisthesis of lumbar region     Plan: We will schedule MRI lumbar spine and patient will follow-up after completion to discuss results.  Follow-Up Instructions: Return in about 3 weeks (around 10/20/2017) for with Dr Louanne Skye to review mri.   Orders:  Orders Placed This Encounter  Procedures  . XR Lumbar Spine 2-3 Views  . MR Lumbar Spine w/o contrast   No orders of the defined types were placed in this encounter.     Procedures: No procedures performed   Clinical Data: No additional findings.   Subjective: Chief Complaint  Patient presents with  . Lower Back - Pain, Follow-up    HPI Patient presents with increased low back pain and left lower extremity radiculopathy.  States that she fell in her yard tripping February 2019.  She had increased symptoms since that event.  States that she had previous back surgery in the early 1990s.  Numbness and tingling pain down to the left foot as described as being constant. Review of Systems No current cardiac pulmonary GI GU issues  Objective: Vital Signs: BP (!) 146/79   Pulse 77   Ht 5\' 3"  (1.6 m)   Wt 238 lb (108 kg)   BMI 42.16 kg/m   Physical Exam Pleasant female alert and oriented in no acute distress.  Gait is somewhat antalgic.  Pain with lumbar flexion and extension.  Lumbar paraspinal tenderness.  Negative logroll bilateral hips.  Pain with left straight leg raise.  Neurovascular intact. Ortho Exam  Specialty Comments:  No specialty comments available.  Imaging: No results found.   PMFS  History: Patient Active Problem List   Diagnosis Date Noted  . Convulsions ([place]) 07/24/2015  . Type 2 diabetes mellitus with chronic kidney disease, without long-term current use of insulin ([place]) 07/24/2015  . Leukocytosis   . Chronic kidney disease (CKD), stage IV (severe) ([place]) 06/13/2015  . Seizures ([place]) 06/12/2015  . DKA (diabetic ketoacidoses) ([place]) 06/12/2015  . Seizure ([place]) 06/12/2015  . High cholesterol   . CAD (coronary artery disease)   . Proteinuria   . GERD (gastroesophageal reflux disease)   . DYSLIPIDEMIA 02/27/2010  . OBESITY, MORBID 02/27/2010  . DM 02/24/2010  . RHEUMATIC FEVER 02/24/2010  . Essential hypertension 02/24/2010  . RENAL INSUFFICIENCY, CHRONIC 02/24/2010   Past Medical History:  Diagnosis Date  . Anemia   . CAD (coronary artery disease)   . Colon polyps   . CRI (chronic renal insufficiency)   . DDD (degenerative disc disease)   . Diabetes mellitus    INSULIN DEPENDENT  . GERD (gastroesophageal reflux disease)   . High cholesterol   . Hypertension   . Pancreatitis   . Proteinuria     Family History  Problem Relation Age of Onset  . Diabetes Mother   . Diabetes Father   . Diabetes Sister     Past Surgical History:  Procedure Laterality Date  . ABDOMINAL HYSTERECTOMY    .  ANGIOPLASTY    . ANKLE SURGERY Right   . BACK SURGERY    . TONSILLECTOMY     Social History   Occupational History  . Occupation: Retired  Tobacco Use  . Smoking status: Former Research scientist (life sciences)  . Smokeless tobacco: Never Used  . Tobacco comment: quit smoking in 2000  Substance and Sexual Activity  . Alcohol use: No    Alcohol/week: 0.0 oz  . Drug use: No  . Sexual activity: Not on file

## 2017-10-26 ENCOUNTER — Ambulatory Visit (INDEPENDENT_AMBULATORY_CARE_PROVIDER_SITE_OTHER): Payer: PPO | Admitting: Specialist

## 2017-11-24 DIAGNOSIS — J329 Chronic sinusitis, unspecified: Secondary | ICD-10-CM | POA: Diagnosis not present

## 2017-11-24 DIAGNOSIS — E1122 Type 2 diabetes mellitus with diabetic chronic kidney disease: Secondary | ICD-10-CM | POA: Diagnosis not present

## 2017-11-24 DIAGNOSIS — I129 Hypertensive chronic kidney disease with stage 1 through stage 4 chronic kidney disease, or unspecified chronic kidney disease: Secondary | ICD-10-CM | POA: Diagnosis not present

## 2017-11-24 DIAGNOSIS — N184 Chronic kidney disease, stage 4 (severe): Secondary | ICD-10-CM | POA: Diagnosis not present

## 2017-11-24 DIAGNOSIS — N189 Chronic kidney disease, unspecified: Secondary | ICD-10-CM | POA: Diagnosis not present

## 2017-11-24 DIAGNOSIS — Z9889 Other specified postprocedural states: Secondary | ICD-10-CM | POA: Diagnosis not present

## 2017-11-24 DIAGNOSIS — D631 Anemia in chronic kidney disease: Secondary | ICD-10-CM | POA: Diagnosis not present

## 2017-11-24 DIAGNOSIS — N2581 Secondary hyperparathyroidism of renal origin: Secondary | ICD-10-CM | POA: Diagnosis not present

## 2017-11-29 ENCOUNTER — Encounter (INDEPENDENT_AMBULATORY_CARE_PROVIDER_SITE_OTHER): Payer: Self-pay | Admitting: *Deleted

## 2017-12-19 DIAGNOSIS — N184 Chronic kidney disease, stage 4 (severe): Secondary | ICD-10-CM | POA: Diagnosis not present

## 2017-12-23 ENCOUNTER — Ambulatory Visit: Payer: PPO | Admitting: Neurology

## 2017-12-23 ENCOUNTER — Other Ambulatory Visit: Payer: Self-pay

## 2017-12-23 ENCOUNTER — Encounter: Payer: Self-pay | Admitting: Neurology

## 2017-12-23 VITALS — BP 120/60 | HR 83 | Ht 63.0 in | Wt 236.0 lb

## 2017-12-23 DIAGNOSIS — R569 Unspecified convulsions: Secondary | ICD-10-CM

## 2017-12-23 NOTE — Patient Instructions (Signed)
Looking great! Follow-up in 6 months, call for any changes.  Seizure Precautions: 1. If medication has been prescribed for you to prevent seizures, take it exactly as directed.  Do not stop taking the medicine without talking to your doctor first, even if you have not had a seizure in a long time.   2. Avoid activities in which a seizure would cause danger to yourself or to others.  Don't operate dangerous machinery, swim alone, or climb in high or dangerous places, such as on ladders, roofs, or girders.  Do not drive unless your doctor says you may.  3. If you have any warning that you may have a seizure, lay down in a safe place where you can't hurt yourself.    4.  No driving for 6 months from last seizure, as per [facility].   Please refer to the following link on the [place] website for more information: http://www.epilepsyfoundation.org/answerplace/Social/driving/drivingu.cfm   5.  Maintain good sleep hygiene. Avoid alcohol.  6.  Contact your doctor if you have any problems that may be related to the medicine you are taking.  7.  Call 911 and bring the patient back to the ED if:        A.  The seizure lasts longer than 5 minutes.       B.  The patient doesn't awaken shortly after the seizure  C.  The patient has new problems such as difficulty seeing, speaking or moving  D.  The patient was injured during the seizure  E.  The patient has a temperature over 102 F (39C)  F.  The patient vomited and now is having trouble breathing

## 2017-12-23 NOTE — Progress Notes (Signed)
NEUROLOGY FOLLOW UP OFFICE NOTE  Emily Hughes [id number]  DOB: 1941/04/12  HISTORY OF PRESENT ILLNESS: I had the pleasure of seeing Emily Hughes in follow-up in the neurology clinic on 12/23/2017.  The patient was last seen 6 months ago for new onset seizure last December 2016 in the setting of hyperglycemia. Her 24-hour EEG was normal. She self-reduced Depakote, and had been taking Depakote ER 500mg  qhs with no seizures or seizure-like symptoms since December 2016. A repeat 1-hour EEG in December 2018 was normal, and she was tapered off Depakote. She feels much better off Depakote. She denies any seizure recurrence off medication, no episodes of left arm jerking, staring/unresponsive episodes, gaps in time, or convulsions. She denies any headaches, dizziness, vision changes, focal numbness/tingling/weakness. Sugar levels are good. She has bad knees causing some unsteadiness and uses a cane, no recent falls.  HPI: This is a pleasant 77 yo RH woman with a history of diabetes, hypertension, CKD, CAD, who was admitted to [facility] last 06/12/15 for new onset seizure. She was in the shower when she suddenly felt dizzy like she would pass out, then her left arm extended up and started jerking uncontrollably. She fell to the floor and lay in the shower, denying any loss of consciousness. She thought her left leg would not move, but it did, and was able to get out of the shower. Her sister called and she told her she was not feeling good. Family brought her to the ER where she was witnessed to have left arm stiffening and jerking followed by a generalized tonic-clonic seizure. Glucose on arrival was 409 with metabolic acidosis, and received treatment for DKA. Her CBC showed a WBC of 14.4, UDS negative. She reports that she had not yet eaten that morning and had not taken her insulin yet. She denied any focal weakness after the seizures. I personally reviewed MRI brain without contrast which did not show any  acute changes, there was mild diffuse volume loss. Her routine wake and sleep EEG was normal. She was started on Vimpat 100mg  BID, however once she was discharged, found that her insurance would not pay for Vimpat. She was started on Depakote 500mg  BID.   She denies any further seizures since hospital discharge. She denies any prior history of seizures, no headaches, further dizziness, diplopia, dysarthria, dysphagia, neck/back pain, focal numbness/tingling/weakness, bowel/bladder dysfunction. She denies any olfactory/gustatory hallucinations, deja vu, rising epigastric sensation. She used to have migraines but only has them infrequently now. She however does not like how Depakote makes her feel. She feels "crazy" on it, with a weird feeling of confusion, staggering when walking, poor balance. She has noticed she occasionally can't get her words out, "like my mind is trying to catch up." This is a new symptom for her. Dose was reduced by her PCP to 500mg  qhs 2 days ago. Her Depakote level on 500mg  BID was 49.2. She reports the staggering is better, but she still feels "weird" on the lower dose.  Epilepsy Risk Factors: She had a normal birth and early development. There is no history of febrile convulsions, CNS infections such as meningitis/encephalitis, significant traumatic brain injury, neurosurgical procedures, or family history of seizures.  PAST MEDICAL HISTORY: Past Medical History:  Diagnosis Date  . Anemia   . CAD (coronary artery disease)   . Colon polyps   . CRI (chronic renal insufficiency)   . DDD (degenerative disc disease)   . Diabetes mellitus    INSULIN DEPENDENT  . GERD (  gastroesophageal reflux disease)   . High cholesterol   . Hypertension   . Pancreatitis   . Proteinuria     MEDICATIONS: Current Outpatient Medications on File Prior to Visit  Medication Sig Dispense Refill  . allopurinol (ZYLOPRIM) 300 MG tablet TAKE 1 TABLET ONCE DAILY. 90 tablet 2  . aspirin 81 MG  tablet Take 81 mg by mouth daily.      . calcitRIOL (ROCALTROL) 0.25 MCG capsule Take 0.25 mcg by mouth daily.    . cetirizine (ZYRTEC) 10 MG tablet Take 1 tablet (10 mg total) by mouth daily. 30 tablet 11  . cloNIDine (CATAPRES) 0.1 MG tablet Take 1 tablet (0.1 mg total) by mouth 2 (two) times daily. 180 tablet 3  . colchicine 0.6 MG tablet TAKE 2 TABLETS NOW AND REPEAT 1 TABLET IN 1 HOUR AS NEEDED FOR GOUT FLARE 30 tablet 3  . diclofenac sodium (VOLTAREN) 1 % GEL Apply 2 g topically 4 (four) times daily. 7 Tube 3  . diltiazem (CARDIZEM CD) 300 MG 24 hr capsule Take 1 capsule by mouth once daily 90 capsule 0  . DIOVAN 160 MG tablet Take 160 mg by mouth 2 (two) times daily.    . divalproex (DEPAKOTE) 250 MG DR tablet Take 1 tablet every night for 2 weeks, then 1/2 tablet every night for 1 week, then stop medication 21 tablet 0  . ferrous fumarate (FERRO-SEQUELS) 50 MG CR tablet Take 50 mg by mouth daily.      . furosemide (LASIX) 40 MG tablet TAKE 2 TABLETS IN THE MORNING AND 1 TABLET IN THE EVENING. 270 tablet 1  . glimepiride (AMARYL) 4 MG tablet TAKE 1 TABLET ONCE DAILY. 30 tablet 3  . HYDROcodone-acetaminophen (NORCO) 5-325 MG tablet Take 1 tablet by mouth every 6 (six) hours as needed for moderate pain. 30 tablet 0  . insulin aspart (NOVOLOG) 100 UNIT/ML FlexPen Inject 10 Units into the skin daily with lunch. (Patient taking differently: Inject 10 Units into the skin. Sliding Scale) 15 mL 3  . LANTUS SOLOSTAR 100 UNIT/ML Solostar Pen INJECT 0.38ML INTO THE SKIN DAILY. (Patient taking differently: INJECT 30 units INTO THE SKIN DAILY.) 15 mL 1  . levothyroxine (SYNTHROID, LEVOTHROID) 50 MCG tablet Take 1 tablet by mouth once daily 90 tablet 0  . metoprolol succinate (TOPROL-XL) 25 MG 24 hr tablet Take 1 tablet by mouth once daily 90 tablet 0  . mometasone (ELOCON) 0.1 % ointment APPLY TOPICALLY DAILY. 45 g 3  . omeprazole (PRILOSEC) 40 MG capsule Take 40 mg by mouth daily.     . pravastatin  (PRAVACHOL) 80 MG tablet Take 1 tablet by mouth at bedtime 90 tablet 0  . sodium bicarbonate 650 MG tablet Take 1 tablet (650 mg total) by mouth 4 (four) times daily. 360 tablet 1  . sucralfate (CARAFATE) 1 G tablet Take 1 tablet (1 g total) by mouth 4 (four) times daily -  with meals and at bedtime. 120 tablet 0  . tiZANidine (ZANAFLEX) 2 MG tablet Take 1 tablet (2 mg total) by mouth 3 (three) times daily. 30 tablet 1  . UNABLE TO FIND Takes a potassium suspension daily    . VELTASSA 8.4 g packet      No current facility-administered medications on file prior to visit.     ALLERGIES: Allergies  Allergen Reactions  . Ace Inhibitors   . Actos [Pioglitazone Hydrochloride]   . Dust Mite Extract   . Dye Fdc Red [Erythrosine Red No. 3]  FAMILY HISTORY: Family History  Problem Relation Age of Onset  . Diabetes Mother   . Diabetes Father   . Diabetes Sister     SOCIAL HISTORY: Social History   Socioeconomic History  . Marital status: Widowed    Spouse name: Not on file  . Number of children: Not on file  . Years of education: Not on file  . Highest education level: Not on file  Occupational History  . Occupation: Retired  Scientific laboratory technician  . Financial resource strain: Not on file  . Food insecurity:    Worry: Not on file    Inability: Not on file  . Transportation needs:    Medical: Not on file    Non-medical: Not on file  Tobacco Use  . Smoking status: Former Research scientist (life sciences)  . Smokeless tobacco: Never Used  . Tobacco comment: quit smoking in 2000  Substance and Sexual Activity  . Alcohol use: No    Alcohol/week: 0.0 oz  . Drug use: No  . Sexual activity: Not on file  Lifestyle  . Physical activity:    Days per week: Not on file    Minutes per session: Not on file  . Stress: Not on file  Relationships  . Social connections:    Talks on phone: Not on file    Gets together: Not on file    Attends religious service: Not on file    Active member of club or organization:  Not on file    Attends meetings of clubs or organizations: Not on file    Relationship status: Not on file  . Intimate partner violence:    Fear of current or ex partner: Not on file    Emotionally abused: Not on file    Physically abused: Not on file    Forced sexual activity: Not on file  Other Topics Concern  . Not on file  Social History Narrative  . Not on file    REVIEW OF SYSTEMS: Constitutional: No fevers, chills, or sweats, no generalized fatigue, change in appetite Eyes: No visual changes, double vision, eye pain Ear, nose and throat: No hearing loss, ear pain, nasal congestion, sore throat Cardiovascular: No chest pain, palpitations Respiratory:  No shortness of breath at rest or with exertion, wheezes GastrointestinaI: No nausea, vomiting, diarrhea, abdominal pain, fecal incontinence Genitourinary:  No dysuria, urinary retention or frequency Musculoskeletal:  No neck pain, back pain Integumentary: No rash, pruritus, skin lesions Neurological: as above Psychiatric: No depression, insomnia, anxiety Endocrine: No palpitations, fatigue, diaphoresis, mood swings, change in appetite, change in weight, increased thirst Hematologic/Lymphatic:  No anemia, purpura, petechiae. Allergic/Immunologic: no itchy/runny eyes, nasal congestion, recent allergic reactions, rashes  PHYSICAL EXAM: Vitals:   12/23/17 0829  BP: 120/60  Pulse: 83  SpO2: 97%   General: No acute distress Head:  Normocephalic/atraumatic Neck: supple, no paraspinal tenderness, full range of motion Heart:  Regular rate and rhythm Lungs:  Clear to auscultation bilaterally Back: No paraspinal tenderness Skin/Extremities: No rash, no edema Neurological Exam: alert and oriented to person, place, and time. No aphasia or dysarthria. Fund of knowledge is appropriate.  Recent and remote memory are intact.  Attention and concentration are normal.    Able to name objects and repeat phrases.  Cranial nerves: CN I:  not tested CN II: pupils equal, round and reactive to light, visual fields intact CN III, IV, VI: full range of motion, no nystagmus, no ptosis CN V: facial sensation intact CN VII: upper and lower face symmetric  CN VIII: hearing intact to conversation CN IX, X: gag intact, uvula midline CN XI: sternocleidomastoid and trapezius muscles intact CN XII: tongue midline Bulk & Tone: normal, no fasciculations. Motor: 5/5 throughout with no pronator drift. Sensation: intact to light touch, cold, pin, vibration sense. No extinction to double simultaneous stimulation. Romberg test negative Deep Tendon Reflexes: +1 throughout, no ankle clonus Plantar responses: downgoing bilaterally Cerebellar: no incoordination on finger to nose testing Gait: slow and cautious without ataxia due to bilateral knee pain, no ataxia Tremor: no resting tremor, +mild high frequency low amplitude postural and endpoint tremor bilaterally (L>R, similar to prior visit)  IMPRESSION: This is a pleasant 77 yo RH woman with a history of hypertension, diabetes, CKD, with new onset seizures last 06/12/2015. She had 2 focal seizures that started with left arm jerking, one of which was witnessed to progress to a GTC. Her glucose level was reported to be in the 460s on admission. Focal motor seizures can be seen with hyperglycemia. She has no other clear epilepsy risk factors, neurological exam normal. Her MRI brain was unremarkable, routine and 24-hour EEG normal. Seizure possibly due to hyperglycemia. She had been seizure-free for 2 years and expressed interest in tapering off medication, repeat 1-hour EEG normal, she has been off Depakote since January 2019 and feels well with no seizures or seizure-like symptoms. Continue control of glucose. She is aware of [place] driving laws to stop driving after a seizure until 6 months seizure-free. She will follow-up in 6 months and knows to call for any changes.  Thank you for allowing me to  participate in her care.  Please do not hesitate to call for any questions or concerns.  The duration of this appointment visit was 15 minutes of face-to-face time with the patient.  Greater than 50% of this time was spent in counseling, explanation of diagnosis, planning of further management, and coordination of care.   Ellouise Newer, M.D.   CC: Dr. Dennard Schaumann

## 2018-02-07 DIAGNOSIS — N2581 Secondary hyperparathyroidism of renal origin: Secondary | ICD-10-CM | POA: Diagnosis not present

## 2018-02-07 DIAGNOSIS — M109 Gout, unspecified: Secondary | ICD-10-CM | POA: Diagnosis not present

## 2018-02-07 DIAGNOSIS — N189 Chronic kidney disease, unspecified: Secondary | ICD-10-CM | POA: Diagnosis not present

## 2018-02-07 DIAGNOSIS — D631 Anemia in chronic kidney disease: Secondary | ICD-10-CM | POA: Diagnosis not present

## 2018-02-07 DIAGNOSIS — N184 Chronic kidney disease, stage 4 (severe): Secondary | ICD-10-CM | POA: Diagnosis not present

## 2018-02-07 DIAGNOSIS — E875 Hyperkalemia: Secondary | ICD-10-CM | POA: Diagnosis not present

## 2018-02-07 DIAGNOSIS — I120 Hypertensive chronic kidney disease with stage 5 chronic kidney disease or end stage renal disease: Secondary | ICD-10-CM | POA: Diagnosis not present

## 2018-02-07 DIAGNOSIS — N185 Chronic kidney disease, stage 5: Secondary | ICD-10-CM | POA: Diagnosis not present

## 2018-02-07 DIAGNOSIS — E1122 Type 2 diabetes mellitus with diabetic chronic kidney disease: Secondary | ICD-10-CM | POA: Diagnosis not present

## 2018-02-10 DIAGNOSIS — E119 Type 2 diabetes mellitus without complications: Secondary | ICD-10-CM | POA: Diagnosis not present

## 2018-02-10 DIAGNOSIS — Z961 Presence of intraocular lens: Secondary | ICD-10-CM | POA: Diagnosis not present

## 2018-02-10 DIAGNOSIS — H5212 Myopia, left eye: Secondary | ICD-10-CM | POA: Diagnosis not present

## 2018-02-10 DIAGNOSIS — H52203 Unspecified astigmatism, bilateral: Secondary | ICD-10-CM | POA: Diagnosis not present

## 2018-02-10 DIAGNOSIS — Z794 Long term (current) use of insulin: Secondary | ICD-10-CM | POA: Diagnosis not present

## 2018-02-10 DIAGNOSIS — H524 Presbyopia: Secondary | ICD-10-CM | POA: Diagnosis not present

## 2018-02-10 DIAGNOSIS — H04123 Dry eye syndrome of bilateral lacrimal glands: Secondary | ICD-10-CM | POA: Diagnosis not present

## 2018-02-10 LAB — HM DIABETES EYE EXAM

## 2018-02-21 ENCOUNTER — Other Ambulatory Visit: Payer: Self-pay | Admitting: Family Medicine

## 2018-03-01 ENCOUNTER — Encounter: Payer: Self-pay | Admitting: *Deleted

## 2018-04-07 DIAGNOSIS — Z1231 Encounter for screening mammogram for malignant neoplasm of breast: Secondary | ICD-10-CM | POA: Diagnosis not present

## 2018-04-07 LAB — HM MAMMOGRAPHY

## 2018-04-12 ENCOUNTER — Ambulatory Visit (INDEPENDENT_AMBULATORY_CARE_PROVIDER_SITE_OTHER): Payer: PPO | Admitting: Family Medicine

## 2018-04-12 DIAGNOSIS — Z23 Encounter for immunization: Secondary | ICD-10-CM

## 2018-04-17 ENCOUNTER — Encounter: Payer: Self-pay | Admitting: *Deleted

## 2018-04-17 ENCOUNTER — Other Ambulatory Visit: Payer: Self-pay | Admitting: Family Medicine

## 2018-05-10 ENCOUNTER — Other Ambulatory Visit: Payer: Self-pay | Admitting: Family Medicine

## 2018-05-31 DIAGNOSIS — N185 Chronic kidney disease, stage 5: Secondary | ICD-10-CM | POA: Diagnosis not present

## 2018-05-31 DIAGNOSIS — E875 Hyperkalemia: Secondary | ICD-10-CM | POA: Diagnosis not present

## 2018-05-31 DIAGNOSIS — J329 Chronic sinusitis, unspecified: Secondary | ICD-10-CM | POA: Diagnosis not present

## 2018-05-31 DIAGNOSIS — D631 Anemia in chronic kidney disease: Secondary | ICD-10-CM | POA: Diagnosis not present

## 2018-05-31 DIAGNOSIS — N189 Chronic kidney disease, unspecified: Secondary | ICD-10-CM | POA: Diagnosis not present

## 2018-05-31 DIAGNOSIS — N2581 Secondary hyperparathyroidism of renal origin: Secondary | ICD-10-CM | POA: Diagnosis not present

## 2018-05-31 DIAGNOSIS — M109 Gout, unspecified: Secondary | ICD-10-CM | POA: Diagnosis not present

## 2018-05-31 DIAGNOSIS — I120 Hypertensive chronic kidney disease with stage 5 chronic kidney disease or end stage renal disease: Secondary | ICD-10-CM | POA: Diagnosis not present

## 2018-06-09 ENCOUNTER — Other Ambulatory Visit: Payer: Self-pay | Admitting: Family Medicine

## 2018-07-28 ENCOUNTER — Other Ambulatory Visit: Payer: Self-pay | Admitting: Family Medicine

## 2018-07-31 ENCOUNTER — Other Ambulatory Visit: Payer: Self-pay | Admitting: Family Medicine

## 2018-08-02 ENCOUNTER — Encounter: Payer: Self-pay | Admitting: Family Medicine

## 2018-08-03 ENCOUNTER — Other Ambulatory Visit: Payer: Self-pay

## 2018-08-03 ENCOUNTER — Encounter: Payer: Self-pay | Admitting: Neurology

## 2018-08-03 ENCOUNTER — Ambulatory Visit (INDEPENDENT_AMBULATORY_CARE_PROVIDER_SITE_OTHER): Payer: PPO | Admitting: Neurology

## 2018-08-03 VITALS — BP 140/68 | HR 80 | Ht 63.0 in | Wt 239.0 lb

## 2018-08-03 DIAGNOSIS — R569 Unspecified convulsions: Secondary | ICD-10-CM

## 2018-08-03 NOTE — Progress Notes (Signed)
NEUROLOGY FOLLOW UP OFFICE NOTE  Emily Hughes [id number]  DOB: [birth year]  HISTORY OF PRESENT ILLNESS: I had the pleasure of seeing Emily Hughes in follow-up in the neurology clinic on 08/03/2018.  The patient was last seen 7 months ago, she had a seizures in December 2016 in the setting of hyperglycemia. She was started on Depakote in the ER. Her 24-hour EEG was normal. After being seizure-free for 2 years, repeat EEG done in 2018 was normal, and we tapered off Depakote in January 2019. She continues to do well seizure-free since 2016, off medication for a year. Glucose levels have been better controlled as well. She denies any episodes of left arm jerking, no staring/unresponsive episodes, gaps in time, focal numbness/tingling/weakness, headaches, dizziness, vision changes, no falls. She has bilateral knee pain.   HPI: This is a pleasant 78 yo RH woman with a history of diabetes, hypertension, CKD, CAD, who was admitted to [facility] last 06/12/15 for new onset seizure. She was in the shower when she suddenly felt dizzy like she would pass out, then her left arm extended up and started jerking uncontrollably. She fell to the floor and lay in the shower, denying any loss of consciousness. She thought her left leg would not move, but it did, and was able to get out of the shower. Her sister called and she told her she was not feeling good. Family brought her to the ER where she was witnessed to have left arm stiffening and jerking followed by a generalized tonic-clonic seizure. Glucose on arrival was 846 with metabolic acidosis, and received treatment for DKA. Her CBC showed a WBC of 14.4, UDS negative. She reports that she had not yet eaten that morning and had not taken her insulin yet. She denied any focal weakness after the seizures. I personally reviewed MRI brain without contrast which did not show any acute changes, there was mild diffuse volume loss. Her routine wake and sleep EEG was normal. She  was started on Vimpat 100mg  BID, however once she was discharged, found that her insurance would not pay for Vimpat. She was started on Depakote 500mg  BID.   She denies any further seizures since hospital discharge. She denies any prior history of seizures, no headaches, further dizziness, diplopia, dysarthria, dysphagia, neck/back pain, focal numbness/tingling/weakness, bowel/bladder dysfunction. She denies any olfactory/gustatory hallucinations, deja vu, rising epigastric sensation. She used to have migraines but only has them infrequently now. She however does not like how Depakote makes her feel. She feels "crazy" on it, with a weird feeling of confusion, staggering when walking, poor balance. She has noticed she occasionally can't get her words out, "like my mind is trying to catch up." This is a new symptom for her. Dose was reduced by her PCP to 500mg  qhs 2 days ago. Her Depakote level on 500mg  BID was 49.2. She reports the staggering is better, but she still feels "weird" on the lower dose.  Epilepsy Risk Factors: She had a normal birth and early development. There is no history of febrile convulsions, CNS infections such as meningitis/encephalitis, significant traumatic brain injury, neurosurgical procedures, or family history of seizures.  PAST MEDICAL HISTORY: Past Medical History:  Diagnosis Date  . Anemia   . CAD (coronary artery disease)   . Colon polyps   . CRI (chronic renal insufficiency)   . DDD (degenerative disc disease)   . Diabetes mellitus    INSULIN DEPENDENT  . GERD (gastroesophageal reflux disease)   . High cholesterol   .  Hypertension   . Pancreatitis   . Proteinuria     MEDICATIONS: Current Outpatient Medications on File Prior to Visit  Medication Sig Dispense Refill  . allopurinol (ZYLOPRIM) 300 MG tablet TAKE 1 TABLET ONCE DAILY. 90 tablet 2  . amLODipine (NORVASC) 10 MG tablet     . aspirin 81 MG tablet Take 81 mg by mouth daily.      . B-D ULTRAFINE III  SHORT PEN 31G X 8 MM MISC USE WITH LANTUS. 100 each 0  . calcitRIOL (ROCALTROL) 0.25 MCG capsule Take 0.25 mcg by mouth daily.    . cetirizine (ZYRTEC) 10 MG tablet Take 1 tablet (10 mg total) by mouth daily. 30 tablet 11  . cloNIDine (CATAPRES) 0.1 MG tablet Take 1 tablet (0.1 mg total) by mouth 2 (two) times daily. 180 tablet 3  . colchicine 0.6 MG tablet TAKE 2 TABLETS NOW AND REPEAT 1 TABLET IN 1 HOUR AS NEEDED FOR GOUT FLARE 30 tablet 3  . diclofenac sodium (VOLTAREN) 1 % GEL Apply 2 g topically 4 (four) times daily. 7 Tube 3  . diltiazem (CARDIZEM CD) 300 MG 24 hr capsule Take 1 capsule by mouth once daily 90 capsule 0  . ferrous fumarate (FERRO-SEQUELS) 50 MG CR tablet Take 50 mg by mouth daily.      . furosemide (LASIX) 40 MG tablet TAKE 2 TABLETS IN THE MORNING AND 1 TABLET IN THE EVENING. 270 tablet 1  . glimepiride (AMARYL) 4 MG tablet TAKE 1 TABLET ONCE DAILY. 30 tablet 0  . HYDROcodone-acetaminophen (NORCO) 5-325 MG tablet Take 1 tablet by mouth every 6 (six) hours as needed for moderate pain. 30 tablet 0  . insulin aspart (NOVOLOG) 100 UNIT/ML FlexPen Inject 10 Units into the skin daily with lunch. (Patient taking differently: Inject 10 Units into the skin. Sliding Scale) 15 mL 3  . Insulin Glargine (LANTUS SOLOSTAR) 100 UNIT/ML Solostar Pen INJECT 0.30ML(30 UNITS) INTO THE SKIN DAILY. 15 mL 0  . levothyroxine (SYNTHROID, LEVOTHROID) 50 MCG tablet Take 1 tablet by mouth once daily 90 tablet 0  . metoprolol succinate (TOPROL-XL) 25 MG 24 hr tablet Take 1 tablet by mouth once daily 90 tablet 0  . mometasone (ELOCON) 0.1 % ointment APPLY TOPICALLY DAILY. 45 g 3  . omeprazole (PRILOSEC) 40 MG capsule Take 40 mg by mouth daily.     . pravastatin (PRAVACHOL) 80 MG tablet Take 1 tablet by mouth at bedtime 90 tablet 0  . sodium bicarbonate 650 MG tablet Take 1 tablet (650 mg total) by mouth 4 (four) times daily. 360 tablet 1  . sucralfate (CARAFATE) 1 G tablet Take 1 tablet (1 g total) by  mouth 4 (four) times daily -  with meals and at bedtime. 120 tablet 0  . tiZANidine (ZANAFLEX) 2 MG tablet Take 1 tablet (2 mg total) by mouth 3 (three) times daily. 30 tablet 1  . UNABLE TO FIND Takes a potassium suspension daily    . VELTASSA 8.4 g packet      No current facility-administered medications on file prior to visit.     ALLERGIES: Allergies  Allergen Reactions  . Ace Inhibitors   . Actos [Pioglitazone Hydrochloride]   . Dust Mite Extract   . Dye Fdc Red [Erythrosine Red No. 3]     FAMILY HISTORY: Family History  Problem Relation Age of Onset  . Diabetes Mother   . Diabetes Father   . Diabetes Sister     SOCIAL HISTORY: Social History  Socioeconomic History  . Marital status: Widowed    Spouse name: Not on file  . Number of children: Not on file  . Years of education: Not on file  . Highest education level: Not on file  Occupational History  . Occupation: Retired  Scientific laboratory technician  . Financial resource strain: Not on file  . Food insecurity:    Worry: Not on file    Inability: Not on file  . Transportation needs:    Medical: Not on file    Non-medical: Not on file  Tobacco Use  . Smoking status: Former Research scientist (life sciences)  . Smokeless tobacco: Never Used  . Tobacco comment: quit smoking in 2000  Substance and Sexual Activity  . Alcohol use: No    Alcohol/week: 0.0 standard drinks  . Drug use: No  . Sexual activity: Not on file  Lifestyle  . Physical activity:    Days per week: Not on file    Minutes per session: Not on file  . Stress: Not on file  Relationships  . Social connections:    Talks on phone: Not on file    Gets together: Not on file    Attends religious service: Not on file    Active member of club or organization: Not on file    Attends meetings of clubs or organizations: Not on file    Relationship status: Not on file  . Intimate partner violence:    Fear of current or ex partner: Not on file    Emotionally abused: Not on file     Physically abused: Not on file    Forced sexual activity: Not on file  Other Topics Concern  . Not on file  Social History Narrative  . Not on file    REVIEW OF SYSTEMS: Constitutional: No fevers, chills, or sweats, no generalized fatigue, change in appetite Eyes: No visual changes, double vision, eye pain Ear, nose and throat: No hearing loss, ear pain, nasal congestion, sore throat Cardiovascular: No chest pain, palpitations Respiratory:  No shortness of breath at rest or with exertion, wheezes GastrointestinaI: No nausea, vomiting, diarrhea, abdominal pain, fecal incontinence Genitourinary:  No dysuria, urinary retention or frequency Musculoskeletal:  No neck pain, back pain, +bilateral knee pain Integumentary: No rash, pruritus, skin lesions Neurological: as above Psychiatric: No depression, insomnia, anxiety Endocrine: No palpitations, fatigue, diaphoresis, mood swings, change in appetite, change in weight, increased thirst Hematologic/Lymphatic:  No anemia, purpura, petechiae. Allergic/Immunologic: no itchy/runny eyes, nasal congestion, recent allergic reactions, rashes  PHYSICAL EXAM: Vitals:   08/03/18 0828  BP: 140/68  Pulse: 80  SpO2: 97%   General: No acute distress Head:  Normocephalic/atraumatic Neck: supple, no paraspinal tenderness, full range of motion Heart:  Regular rate and rhythm Lungs:  Clear to auscultation bilaterally Back: No paraspinal tenderness Skin/Extremities: No rash, no edema Neurological Exam: alert and oriented to person, place, and time. No aphasia or dysarthria. Fund of knowledge is appropriate.  Recent and remote memory are intact.  Attention and concentration are normal.    Able to name objects and repeat phrases.  Cranial nerves: CN I: not tested CN II: pupils equal, round and reactive to light, visual fields intact CN III, IV, VI: full range of motion, no nystagmus, no ptosis CN VII: upper and lower face symmetric CN VIII: hearing  intact to conversation Bulk & Tone: normal, no fasciculations. Motor: 5/5 throughout with no pronator drift. Cerebellar: no incoordination on finger to nose testing Gait: slow and cautious due to bilateral knee  pain, no ataxia Tremor: no tremor today  IMPRESSION: This is a pleasant 78 yo RH woman with a history of hypertension, diabetes, CKD, with new onset seizures last 06/12/2015. She had 2 focal seizures that started with left arm jerking, one of which was witnessed to progress to a GTC. Her glucose level was reported to be in the 460s on admission. Focal motor seizures can be seen with hyperglycemia. She has no other clear epilepsy risk factors, neurological exam normal. Her MRI brain was unremarkable, routine and 24-hour EEG normal. She has been seizure-free since 2016 and off Depakote for a year with n no seizure recurrence. Seizures likely due to hyperglycemia, she reports glucose levels have been controlled, continue to monitor. She is aware of [place] driving laws to stop driving after a seizure until 6 months seizure-free. She will follow-up as needed and knows to call for any changes.  Thank you for allowing me to participate in her care.  Please do not hesitate to call for any questions or concerns.  The duration of this appointment visit was 15 minutes of face-to-face time with the patient.  Greater than 50% of this time was spent in counseling, explanation of diagnosis, planning of further management, and coordination of care.   Ellouise Newer, M.D.   CC: Dr. Dennard Schaumann

## 2018-08-03 NOTE — Patient Instructions (Signed)
Looking great! Continue with sugar control. Follow-up as needed, call for any changes. Wishing you well!

## 2018-08-08 ENCOUNTER — Other Ambulatory Visit: Payer: Self-pay | Admitting: Family Medicine

## 2018-08-30 DIAGNOSIS — E1129 Type 2 diabetes mellitus with other diabetic kidney complication: Secondary | ICD-10-CM | POA: Diagnosis not present

## 2018-08-30 DIAGNOSIS — N189 Chronic kidney disease, unspecified: Secondary | ICD-10-CM | POA: Diagnosis not present

## 2018-08-30 DIAGNOSIS — E1122 Type 2 diabetes mellitus with diabetic chronic kidney disease: Secondary | ICD-10-CM | POA: Diagnosis not present

## 2018-08-30 DIAGNOSIS — D631 Anemia in chronic kidney disease: Secondary | ICD-10-CM | POA: Diagnosis not present

## 2018-08-30 DIAGNOSIS — I120 Hypertensive chronic kidney disease with stage 5 chronic kidney disease or end stage renal disease: Secondary | ICD-10-CM | POA: Diagnosis not present

## 2018-08-30 DIAGNOSIS — N2581 Secondary hyperparathyroidism of renal origin: Secondary | ICD-10-CM | POA: Diagnosis not present

## 2018-08-30 DIAGNOSIS — N185 Chronic kidney disease, stage 5: Secondary | ICD-10-CM | POA: Diagnosis not present

## 2018-08-30 DIAGNOSIS — M109 Gout, unspecified: Secondary | ICD-10-CM | POA: Diagnosis not present

## 2018-09-22 ENCOUNTER — Other Ambulatory Visit: Payer: Self-pay

## 2018-09-22 ENCOUNTER — Encounter: Payer: Self-pay | Admitting: Family Medicine

## 2018-09-22 ENCOUNTER — Ambulatory Visit (INDEPENDENT_AMBULATORY_CARE_PROVIDER_SITE_OTHER): Payer: PPO | Admitting: Family Medicine

## 2018-09-22 VITALS — BP 130/64 | HR 70 | Temp 98.1°F | Resp 16 | Ht 63.0 in | Wt 242.0 lb

## 2018-09-22 DIAGNOSIS — N186 End stage renal disease: Secondary | ICD-10-CM

## 2018-09-22 DIAGNOSIS — N184 Chronic kidney disease, stage 4 (severe): Secondary | ICD-10-CM | POA: Diagnosis not present

## 2018-09-22 DIAGNOSIS — E1122 Type 2 diabetes mellitus with diabetic chronic kidney disease: Secondary | ICD-10-CM

## 2018-09-22 DIAGNOSIS — E039 Hypothyroidism, unspecified: Secondary | ICD-10-CM

## 2018-09-22 MED ORDER — ALLOPURINOL 300 MG PO TABS: 300.0000 mg | ORAL_TABLET | Freq: Every day | ORAL | 2 refills | Status: DC

## 2018-09-22 MED ORDER — ALLOPURINOL 100 MG PO TABS: 100.0000 mg | ORAL_TABLET | Freq: Every day | ORAL | 2 refills | Status: DC

## 2018-09-22 NOTE — Progress Notes (Signed)
Subjective:    Patient ID: Emily Hughes, female    DOB: 03-24-1941, 78 y.o.   MRN: [id number]  HPI Patient has a history of stage IV chronic kidney disease.  She recently saw her nephrologist in February and her creatinine was 3.37.  This is near her baseline.  Unfortunately she continues to have frequent gout attacks.  These are debilitating despite taking 300 mg a day of allopurinol.  On the lab work I received from her nephrologist in February, her uric acid level was 11.2.  Hemoglobin A1c was 7.7.  She is currently on basal insulin in the morning and then use a sliding scale insulin with meals throughout the day.  She denies any hypoglycemic episodes.  Her blood sugars tend to stay below 200.  Given her chronic kidney disease and age, I am happy with her hemoglobin A1c between 7 and 8 and therefore I elected to make no changes in her insulin dose.  She denies any chest pain shortness of breath or dyspnea on exertion.  She denies any myalgias or right upper quadrant pain.  She denies any polyuria, polydipsia, or blurry vision. Past Medical History:  Diagnosis Date  . Anemia   . CAD (coronary artery disease)   . Colon polyps   . CRI (chronic renal insufficiency)   . DDD (degenerative disc disease)   . Diabetes mellitus    INSULIN DEPENDENT  . GERD (gastroesophageal reflux disease)   . High cholesterol   . Hypertension   . Pancreatitis   . Proteinuria    Past Surgical History:  Procedure Laterality Date  . ABDOMINAL HYSTERECTOMY    . ANGIOPLASTY    . ANKLE SURGERY Right   . BACK SURGERY    . TONSILLECTOMY     Current Outpatient Medications on File Prior to Visit  Medication Sig Dispense Refill  . amLODipine (NORVASC) 10 MG tablet     . aspirin 81 MG tablet Take 81 mg by mouth daily.      . B-D ULTRAFINE III SHORT PEN 31G X 8 MM MISC USE WITH LANTUS. 100 each 0  . calcitRIOL (ROCALTROL) 0.25 MCG capsule Take 0.25 mcg by mouth daily.    . cetirizine (ZYRTEC) 10 MG tablet Take 1  tablet (10 mg total) by mouth daily. 30 tablet 11  . cloNIDine (CATAPRES) 0.1 MG tablet Take 1 tablet (0.1 mg total) by mouth 2 (two) times daily. 180 tablet 3  . colchicine 0.6 MG tablet TAKE 2 TABLETS NOW AND REPEAT 1 TABLET IN 1 HOUR AS NEEDED FOR GOUT FLARE 30 tablet 0  . diclofenac sodium (VOLTAREN) 1 % GEL Apply 2 g topically 4 (four) times daily. 7 Tube 3  . diltiazem (CARDIZEM CD) 300 MG 24 hr capsule Take 1 capsule by mouth once daily 90 capsule 0  . ferrous fumarate (FERRO-SEQUELS) 50 MG CR tablet Take 50 mg by mouth daily.      . furosemide (LASIX) 40 MG tablet TAKE 2 TABLETS IN THE MORNING AND 1 TABLET IN THE EVENING. 270 tablet 1  . glimepiride (AMARYL) 4 MG tablet TAKE 1 TABLET ONCE DAILY. 30 tablet 0  . HYDROcodone-acetaminophen (NORCO) 5-325 MG tablet Take 1 tablet by mouth every 6 (six) hours as needed for moderate pain. 30 tablet 0  . insulin aspart (NOVOLOG) 100 UNIT/ML FlexPen Inject 10 Units into the skin daily with lunch. (Patient taking differently: Inject 10 Units into the skin. Sliding Scale) 15 mL 3  . Insulin Glargine (LANTUS SOLOSTAR) 100  UNIT/ML Solostar Pen INJECT 0.30ML(30 UNITS) INTO THE SKIN DAILY. 15 mL 0  . levothyroxine (SYNTHROID, LEVOTHROID) 50 MCG tablet Take 1 tablet by mouth once daily 90 tablet 0  . metoprolol succinate (TOPROL-XL) 25 MG 24 hr tablet Take 1 tablet by mouth once daily 90 tablet 0  . mometasone (ELOCON) 0.1 % ointment APPLY TOPICALLY DAILY. 45 g 3  . omeprazole (PRILOSEC) 40 MG capsule Take 40 mg by mouth daily.     . pravastatin (PRAVACHOL) 80 MG tablet Take 1 tablet by mouth at bedtime 90 tablet 0  . sodium bicarbonate 650 MG tablet Take 1 tablet (650 mg total) by mouth 4 (four) times daily. 360 tablet 1  . sucralfate (CARAFATE) 1 G tablet Take 1 tablet (1 g total) by mouth 4 (four) times daily -  with meals and at bedtime. 120 tablet 0  . tiZANidine (ZANAFLEX) 2 MG tablet Take 1 tablet (2 mg total) by mouth 3 (three) times daily. 30 tablet  1  . UNABLE TO FIND Takes a potassium suspension daily    . VELTASSA 8.4 g packet      No current facility-administered medications on file prior to visit.    Allergies  Allergen Reactions  . Ace Inhibitors   . Actos [Pioglitazone Hydrochloride]   . Dust Mite Extract   . Dye Fdc Red [Erythrosine Red No. 3]    Social History   Socioeconomic History  . Marital status: Widowed    Spouse name: Not on file  . Number of children: Not on file  . Years of education: Not on file  . Highest education level: Not on file  Occupational History  . Occupation: Retired  Scientific laboratory technician  . Financial resource strain: Not on file  . Food insecurity:    Worry: Not on file    Inability: Not on file  . Transportation needs:    Medical: Not on file    Non-medical: Not on file  Tobacco Use  . Smoking status: Former Research scientist (life sciences)  . Smokeless tobacco: Never Used  . Tobacco comment: quit smoking in 2000  Substance and Sexual Activity  . Alcohol use: No    Alcohol/week: 0.0 standard drinks  . Drug use: No  . Sexual activity: Not on file  Lifestyle  . Physical activity:    Days per week: Not on file    Minutes per session: Not on file  . Stress: Not on file  Relationships  . Social connections:    Talks on phone: Not on file    Gets together: Not on file    Attends religious service: Not on file    Active member of club or organization: Not on file    Attends meetings of clubs or organizations: Not on file    Relationship status: Not on file  . Intimate partner violence:    Fear of current or ex partner: Not on file    Emotionally abused: Not on file    Physically abused: Not on file    Forced sexual activity: Not on file  Other Topics Concern  . Not on file  Social History Narrative  . Not on file      Review of Systems  All other systems reviewed and are negative.      Objective:   Physical Exam  Constitutional: She appears well-developed and well-nourished.  Neck: No JVD  present. No thyromegaly present.  Cardiovascular: Normal rate, regular rhythm, normal heart sounds and intact distal pulses.  Pulmonary/Chest: Effort  normal and breath sounds normal. No respiratory distress. She has no wheezes. She has no rales.  Abdominal: Soft. Bowel sounds are normal. She exhibits no distension. There is no abdominal tenderness. There is no rebound.  Musculoskeletal:        General: No edema.  Vitals reviewed.         Assessment & Plan:  Type II diabetes mellitus with end-stage renal disease ([place]) - Plan: CBC with Differential/Platelet, COMPLETE METABOLIC PANEL WITH GFR, Lipid panel, Microalbumin, urine, Hemoglobin A1c  Chronic kidney disease (CKD), stage IV (severe) ([facility]) - Plan: Uric acid  Hypothyroidism, unspecified type  Continue basal insulin 30 units every day.  Use sliding scale with meals.  Try to keep hemoglobin A1c between 7 and 8 given her chronic kidney disease and age.  I would recommend checking fasting lab work again in June.  Increase allopurinol from 300 mg a day to 400 mg a day but monitor for pruritus, rash, elevated liver function test, or nausea.  She will return in June to recheck her uric acid level along with liver function test and renal function but hopefully this will decrease the frequency of her gout attacks.  Her blood pressure today is well controlled.  I would like to check a fasting lipid panel in June.  Otherwise we will make no changes in her medication at this time

## 2018-09-22 NOTE — Addendum Note (Signed)
Addended by: Shary Decamp B on: 09/22/2018 10:35 AM   Modules accepted: Orders

## 2018-09-25 ENCOUNTER — Other Ambulatory Visit: Payer: Self-pay | Admitting: Family Medicine

## 2018-09-27 ENCOUNTER — Other Ambulatory Visit: Payer: Self-pay | Admitting: Family Medicine

## 2018-09-29 ENCOUNTER — Telehealth: Payer: Self-pay | Admitting: Family Medicine

## 2018-09-29 NOTE — Telephone Encounter (Signed)
Pharmacy called [facility]) about patient being on Amlodipine and Diltiazem. She stated that this was duplicate therapy and wanted to make sure she was supposed to be on both medications?

## 2018-09-29 NOTE — Telephone Encounter (Signed)
D/c amlodipine

## 2018-10-02 MED ORDER — DILTIAZEM HCL ER COATED BEADS 300 MG PO CP24: 300.0000 mg | ORAL_CAPSULE | Freq: Every day | ORAL | 3 refills | Status: DC

## 2018-10-02 MED ORDER — LEVOTHYROXINE SODIUM 50 MCG PO TABS: 50.0000 ug | ORAL_TABLET | Freq: Every day | ORAL | 2 refills | Status: DC

## 2018-10-02 NOTE — Telephone Encounter (Signed)
Pt aware and does not have amlodipine in her meds box.

## 2018-10-02 NOTE — Telephone Encounter (Signed)
Sent [facility] with dc order to stop amlodipine.

## 2018-10-10 ENCOUNTER — Telehealth: Payer: Self-pay | Admitting: Family Medicine

## 2018-10-10 MED ORDER — COLCHICINE 0.6 MG PO TABS: 0.3000 mg | ORAL_TABLET | Freq: Every day | ORAL | 3 refills | Status: DC

## 2018-10-10 NOTE — Telephone Encounter (Signed)
Pt called and states that the gout medication is not helping her at all and would like to know what else she can take?

## 2018-10-10 NOTE — Telephone Encounter (Signed)
Pt aware and med sent to pharm 

## 2018-10-10 NOTE — Telephone Encounter (Signed)
Could add colchicine 0.3 mg poqday with the allopurinol.

## 2018-11-08 ENCOUNTER — Emergency Department (HOSPITAL_COMMUNITY): Payer: PPO

## 2018-11-08 ENCOUNTER — Inpatient Hospital Stay (HOSPITAL_COMMUNITY)
Admission: EM | Admit: 2018-11-08 | Discharge: 2018-11-29 | DRG: 853 | Disposition: A | Discharge status: Skilled Nursing Facility | Payer: PPO | Attending: Internal Medicine | Admitting: Internal Medicine

## 2018-11-08 ENCOUNTER — Other Ambulatory Visit: Payer: Self-pay

## 2018-11-08 DIAGNOSIS — G9341 Metabolic encephalopathy: Secondary | ICD-10-CM | POA: Diagnosis not present

## 2018-11-08 DIAGNOSIS — M1009 Idiopathic gout, multiple sites: Secondary | ICD-10-CM | POA: Diagnosis not present

## 2018-11-08 DIAGNOSIS — Z6841 Body Mass Index (BMI) 40.0 and over, adult: Secondary | ICD-10-CM | POA: Diagnosis not present

## 2018-11-08 DIAGNOSIS — E1165 Type 2 diabetes mellitus with hyperglycemia: Secondary | ICD-10-CM | POA: Diagnosis not present

## 2018-11-08 DIAGNOSIS — K610 Anal abscess: Secondary | ICD-10-CM | POA: Diagnosis not present

## 2018-11-08 DIAGNOSIS — M6281 Muscle weakness (generalized): Secondary | ICD-10-CM | POA: Diagnosis not present

## 2018-11-08 DIAGNOSIS — G4730 Sleep apnea, unspecified: Secondary | ICD-10-CM | POA: Diagnosis present

## 2018-11-08 DIAGNOSIS — Z90710 Acquired absence of both cervix and uterus: Secondary | ICD-10-CM | POA: Diagnosis not present

## 2018-11-08 DIAGNOSIS — M2550 Pain in unspecified joint: Secondary | ICD-10-CM | POA: Diagnosis not present

## 2018-11-08 DIAGNOSIS — L03317 Cellulitis of buttock: Secondary | ICD-10-CM | POA: Diagnosis not present

## 2018-11-08 DIAGNOSIS — R4182 Altered mental status, unspecified: Secondary | ICD-10-CM | POA: Diagnosis not present

## 2018-11-08 DIAGNOSIS — E43 Unspecified severe protein-calorie malnutrition: Secondary | ICD-10-CM

## 2018-11-08 DIAGNOSIS — Z79899 Other long term (current) drug therapy: Secondary | ICD-10-CM

## 2018-11-08 DIAGNOSIS — A4151 Sepsis due to Escherichia coli [E. coli]: Secondary | ICD-10-CM | POA: Diagnosis not present

## 2018-11-08 DIAGNOSIS — E7800 Pure hypercholesterolemia, unspecified: Secondary | ICD-10-CM | POA: Diagnosis present

## 2018-11-08 DIAGNOSIS — I340 Nonrheumatic mitral (valve) insufficiency: Secondary | ICD-10-CM | POA: Diagnosis not present

## 2018-11-08 DIAGNOSIS — I361 Nonrheumatic tricuspid (valve) insufficiency: Secondary | ICD-10-CM | POA: Diagnosis not present

## 2018-11-08 DIAGNOSIS — I4891 Unspecified atrial fibrillation: Secondary | ICD-10-CM | POA: Diagnosis present

## 2018-11-08 DIAGNOSIS — Z23 Encounter for immunization: Secondary | ICD-10-CM

## 2018-11-08 DIAGNOSIS — I4892 Unspecified atrial flutter: Secondary | ICD-10-CM | POA: Diagnosis not present

## 2018-11-08 DIAGNOSIS — N184 Chronic kidney disease, stage 4 (severe): Secondary | ICD-10-CM | POA: Diagnosis not present

## 2018-11-08 DIAGNOSIS — E1110 Type 2 diabetes mellitus with ketoacidosis without coma: Secondary | ICD-10-CM | POA: Diagnosis not present

## 2018-11-08 DIAGNOSIS — R338 Other retention of urine: Secondary | ICD-10-CM | POA: Clinically undetermined

## 2018-11-08 DIAGNOSIS — Z741 Need for assistance with personal care: Secondary | ICD-10-CM | POA: Diagnosis not present

## 2018-11-08 DIAGNOSIS — R197 Diarrhea, unspecified: Secondary | ICD-10-CM | POA: Diagnosis not present

## 2018-11-08 DIAGNOSIS — I129 Hypertensive chronic kidney disease with stage 1 through stage 4 chronic kidney disease, or unspecified chronic kidney disease: Secondary | ICD-10-CM | POA: Diagnosis not present

## 2018-11-08 DIAGNOSIS — Z7982 Long term (current) use of aspirin: Secondary | ICD-10-CM

## 2018-11-08 DIAGNOSIS — E871 Hypo-osmolality and hyponatremia: Secondary | ICD-10-CM | POA: Diagnosis not present

## 2018-11-08 DIAGNOSIS — R1319 Other dysphagia: Secondary | ICD-10-CM | POA: Diagnosis not present

## 2018-11-08 DIAGNOSIS — Z794 Long term (current) use of insulin: Secondary | ICD-10-CM | POA: Diagnosis not present

## 2018-11-08 DIAGNOSIS — I442 Atrioventricular block, complete: Secondary | ICD-10-CM | POA: Diagnosis present

## 2018-11-08 DIAGNOSIS — R0902 Hypoxemia: Secondary | ICD-10-CM | POA: Diagnosis not present

## 2018-11-08 DIAGNOSIS — R7989 Other specified abnormal findings of blood chemistry: Secondary | ICD-10-CM | POA: Diagnosis present

## 2018-11-08 DIAGNOSIS — M1711 Unilateral primary osteoarthritis, right knee: Secondary | ICD-10-CM | POA: Diagnosis not present

## 2018-11-08 DIAGNOSIS — Z03818 Encounter for observation for suspected exposure to other biological agents ruled out: Secondary | ICD-10-CM | POA: Diagnosis not present

## 2018-11-08 DIAGNOSIS — R2689 Other abnormalities of gait and mobility: Secondary | ICD-10-CM | POA: Diagnosis not present

## 2018-11-08 DIAGNOSIS — Z79890 Hormone replacement therapy (postmenopausal): Secondary | ICD-10-CM

## 2018-11-08 DIAGNOSIS — I248 Other forms of acute ischemic heart disease: Secondary | ICD-10-CM | POA: Diagnosis not present

## 2018-11-08 DIAGNOSIS — R945 Abnormal results of liver function studies: Secondary | ICD-10-CM

## 2018-11-08 DIAGNOSIS — K219 Gastro-esophageal reflux disease without esophagitis: Secondary | ICD-10-CM | POA: Diagnosis not present

## 2018-11-08 DIAGNOSIS — M1712 Unilateral primary osteoarthritis, left knee: Secondary | ICD-10-CM | POA: Diagnosis not present

## 2018-11-08 DIAGNOSIS — Z1159 Encounter for screening for other viral diseases: Secondary | ICD-10-CM

## 2018-11-08 DIAGNOSIS — L0231 Cutaneous abscess of buttock: Secondary | ICD-10-CM | POA: Diagnosis present

## 2018-11-08 DIAGNOSIS — R509 Fever, unspecified: Secondary | ICD-10-CM | POA: Diagnosis not present

## 2018-11-08 DIAGNOSIS — Z87891 Personal history of nicotine dependence: Secondary | ICD-10-CM

## 2018-11-08 DIAGNOSIS — N189 Chronic kidney disease, unspecified: Secondary | ICD-10-CM | POA: Diagnosis not present

## 2018-11-08 DIAGNOSIS — A419 Sepsis, unspecified organism: Secondary | ICD-10-CM | POA: Diagnosis present

## 2018-11-08 DIAGNOSIS — R339 Retention of urine, unspecified: Secondary | ICD-10-CM | POA: Diagnosis not present

## 2018-11-08 DIAGNOSIS — R000 Tachycardia, unspecified: Secondary | ICD-10-CM | POA: Diagnosis not present

## 2018-11-08 DIAGNOSIS — M79672 Pain in left foot: Secondary | ICD-10-CM | POA: Diagnosis not present

## 2018-11-08 DIAGNOSIS — I10 Essential (primary) hypertension: Secondary | ICD-10-CM | POA: Diagnosis present

## 2018-11-08 DIAGNOSIS — R52 Pain, unspecified: Secondary | ICD-10-CM

## 2018-11-08 DIAGNOSIS — I313 Pericardial effusion (noninflammatory): Secondary | ICD-10-CM | POA: Diagnosis present

## 2018-11-08 DIAGNOSIS — I483 Typical atrial flutter: Secondary | ICD-10-CM | POA: Diagnosis not present

## 2018-11-08 DIAGNOSIS — E785 Hyperlipidemia, unspecified: Secondary | ICD-10-CM | POA: Diagnosis present

## 2018-11-08 DIAGNOSIS — R0602 Shortness of breath: Secondary | ICD-10-CM | POA: Diagnosis not present

## 2018-11-08 DIAGNOSIS — E1122 Type 2 diabetes mellitus with diabetic chronic kidney disease: Secondary | ICD-10-CM | POA: Diagnosis present

## 2018-11-08 DIAGNOSIS — I2510 Atherosclerotic heart disease of native coronary artery without angina pectoris: Secondary | ICD-10-CM | POA: Diagnosis present

## 2018-11-08 DIAGNOSIS — I959 Hypotension, unspecified: Secondary | ICD-10-CM | POA: Diagnosis not present

## 2018-11-08 DIAGNOSIS — G40909 Epilepsy, unspecified, not intractable, without status epilepticus: Secondary | ICD-10-CM | POA: Diagnosis present

## 2018-11-08 DIAGNOSIS — N179 Acute kidney failure, unspecified: Secondary | ICD-10-CM | POA: Diagnosis not present

## 2018-11-08 DIAGNOSIS — K5900 Constipation, unspecified: Secondary | ICD-10-CM | POA: Diagnosis not present

## 2018-11-08 DIAGNOSIS — L0390 Cellulitis, unspecified: Secondary | ICD-10-CM | POA: Diagnosis not present

## 2018-11-08 DIAGNOSIS — R739 Hyperglycemia, unspecified: Secondary | ICD-10-CM | POA: Diagnosis not present

## 2018-11-08 DIAGNOSIS — Z209 Contact with and (suspected) exposure to unspecified communicable disease: Secondary | ICD-10-CM | POA: Diagnosis not present

## 2018-11-08 DIAGNOSIS — E039 Hypothyroidism, unspecified: Secondary | ICD-10-CM | POA: Diagnosis not present

## 2018-11-08 DIAGNOSIS — R102 Pelvic and perineal pain: Secondary | ICD-10-CM | POA: Diagnosis not present

## 2018-11-08 DIAGNOSIS — Z66 Do not resuscitate: Secondary | ICD-10-CM | POA: Diagnosis present

## 2018-11-08 DIAGNOSIS — T383X6A Underdosing of insulin and oral hypoglycemic [antidiabetic] drugs, initial encounter: Secondary | ICD-10-CM | POA: Diagnosis present

## 2018-11-08 DIAGNOSIS — R778 Other specified abnormalities of plasma proteins: Secondary | ICD-10-CM | POA: Diagnosis present

## 2018-11-08 DIAGNOSIS — R1011 Right upper quadrant pain: Secondary | ICD-10-CM | POA: Diagnosis not present

## 2018-11-08 DIAGNOSIS — Z833 Family history of diabetes mellitus: Secondary | ICD-10-CM | POA: Diagnosis not present

## 2018-11-08 DIAGNOSIS — Z7401 Bed confinement status: Secondary | ICD-10-CM | POA: Diagnosis not present

## 2018-11-08 DIAGNOSIS — D649 Anemia, unspecified: Secondary | ICD-10-CM | POA: Diagnosis not present

## 2018-11-08 DIAGNOSIS — K611 Rectal abscess: Secondary | ICD-10-CM | POA: Diagnosis not present

## 2018-11-08 DIAGNOSIS — D631 Anemia in chronic kidney disease: Secondary | ICD-10-CM | POA: Diagnosis present

## 2018-11-08 DIAGNOSIS — K828 Other specified diseases of gallbladder: Secondary | ICD-10-CM | POA: Diagnosis not present

## 2018-11-08 DIAGNOSIS — E875 Hyperkalemia: Secondary | ICD-10-CM | POA: Diagnosis not present

## 2018-11-08 LAB — COMPREHENSIVE METABOLIC PANEL
ALT: 32 U/L (ref 0–44)
AST: 31 U/L (ref 15–41)
Albumin: 3.2 g/dL — ABNORMAL LOW (ref 3.5–5.0)
Alkaline Phosphatase: 113 U/L (ref 38–126)
Anion gap: 19 — ABNORMAL HIGH (ref 5–15)
BUN: 125 mg/dL — ABNORMAL HIGH (ref 8–23)
CO2: 16 mmol/L — ABNORMAL LOW (ref 22–32)
Calcium: 9.6 mg/dL (ref 8.9–10.3)
Chloride: 94 mmol/L — ABNORMAL LOW (ref 98–111)
Creatinine, Ser: 4.76 mg/dL — ABNORMAL HIGH (ref 0.44–1.00)
GFR calc Af Amer: 10 mL/min — ABNORMAL LOW (ref >60)
GFR calc non Af Amer: 8 mL/min — ABNORMAL LOW (ref >60)
Glucose, Bld: 516 mg/dL (ref 70–99)
Potassium: 5.1 mmol/L (ref 3.5–5.1)
Sodium: 129 mmol/L — ABNORMAL LOW (ref 135–145)
Total Bilirubin: 0.7 mg/dL (ref 0.3–1.2)
Total Protein: 7.8 g/dL (ref 6.5–8.1)

## 2018-11-08 LAB — BASIC METABOLIC PANEL
Anion gap: 14 (ref 5–15)
BUN: 121 mg/dL — ABNORMAL HIGH (ref 8–23)
CO2: 16 mmol/L — ABNORMAL LOW (ref 22–32)
Calcium: 8.4 mg/dL — ABNORMAL LOW (ref 8.9–10.3)
Chloride: 101 mmol/L (ref 98–111)
Creatinine, Ser: 4.22 mg/dL — ABNORMAL HIGH (ref 0.44–1.00)
GFR calc Af Amer: 11 mL/min — ABNORMAL LOW (ref >60)
GFR calc non Af Amer: 10 mL/min — ABNORMAL LOW (ref >60)
Glucose, Bld: 458 mg/dL — ABNORMAL HIGH (ref 70–99)
Potassium: 4.9 mmol/L (ref 3.5–5.1)
Sodium: 131 mmol/L — ABNORMAL LOW (ref 135–145)

## 2018-11-08 LAB — CBC WITH DIFFERENTIAL/PLATELET
Abs Immature Granulocytes: 0.45 10*3/uL — ABNORMAL HIGH (ref 0.00–0.07)
Basophils Absolute: 0.1 10*3/uL (ref 0.0–0.1)
Basophils Relative: 0 %
Eosinophils Absolute: 0.2 10*3/uL (ref 0.0–0.5)
Eosinophils Relative: 1 %
HCT: 35 % — ABNORMAL LOW (ref 36.0–46.0)
Hemoglobin: 11.4 g/dL — ABNORMAL LOW (ref 12.0–15.0)
Immature Granulocytes: 2 %
Lymphocytes Relative: 3 %
Lymphs Abs: 0.9 10*3/uL (ref 0.7–4.0)
MCH: 27.2 pg (ref 26.0–34.0)
MCHC: 32.6 g/dL (ref 30.0–36.0)
MCV: 83.5 fL (ref 80.0–100.0)
Monocytes Absolute: 1.4 10*3/uL — ABNORMAL HIGH (ref 0.1–1.0)
Monocytes Relative: 5 %
Neutro Abs: 24.5 10*3/uL — ABNORMAL HIGH (ref 1.7–7.7)
Neutrophils Relative %: 89 %
Platelets: 285 10*3/uL (ref 150–400)
RBC: 4.19 MIL/uL (ref 3.87–5.11)
RDW: 14.9 % (ref 11.5–15.5)
WBC: 27.5 10*3/uL — ABNORMAL HIGH (ref 4.0–10.5)
nRBC: 0 % (ref 0.0–0.2)

## 2018-11-08 LAB — PROCALCITONIN: Procalcitonin: 6.89 ng/mL

## 2018-11-08 LAB — CBG MONITORING, ED
Glucose-Capillary: 436 mg/dL — ABNORMAL HIGH (ref 70–99)
Glucose-Capillary: 427 mg/dL — ABNORMAL HIGH (ref 70–99)
Glucose-Capillary: 405 mg/dL — ABNORMAL HIGH (ref 70–99)
Glucose-Capillary: 361 mg/dL — ABNORMAL HIGH (ref 70–99)
Glucose-Capillary: 315 mg/dL — ABNORMAL HIGH (ref 70–99)

## 2018-11-08 LAB — BLOOD GAS, VENOUS
Acid-Base Excess: 8.2 mmol/L — ABNORMAL HIGH (ref 0.0–2.0)
Acid-base deficit: 8.1 mmol/L — ABNORMAL HIGH (ref 0.0–2.0)
Bicarbonate: 17.2 mmol/L — ABNORMAL LOW (ref 20.0–28.0)
O2 Saturation: 55.7 %
Patient temperature: 37
pCO2, Ven: 37.4 mmHg — ABNORMAL LOW (ref 44.0–60.0)
pH, Ven: 7.286 (ref 7.250–7.430)
pO2, Ven: 34.6 mmHg (ref 32.0–45.0)

## 2018-11-08 LAB — SARS CORONAVIRUS 2 BY RT PCR (HOSPITAL ORDER, PERFORMED IN ~~LOC~~ HOSPITAL LAB): SARS Coronavirus 2: NEGATIVE

## 2018-11-08 LAB — LACTIC ACID, PLASMA
Lactic Acid, Venous: 2.8 mmol/L (ref 0.5–1.9)
Lactic Acid, Venous: 1.7 mmol/L (ref 0.5–1.9)
Lactic Acid, Venous: 2 mmol/L (ref 0.5–1.9)

## 2018-11-08 LAB — GLUCOSE, CAPILLARY: Glucose-Capillary: 254 mg/dL — ABNORMAL HIGH (ref 70–99)

## 2018-11-08 LAB — PROTIME-INR
INR: 1.3 — ABNORMAL HIGH (ref 0.8–1.2)
Prothrombin Time: 16.5 seconds — ABNORMAL HIGH (ref 11.4–15.2)

## 2018-11-08 LAB — APTT: aPTT: 29 seconds (ref 24–36)

## 2018-11-08 LAB — TROPONIN I: Troponin I: 0.09 ng/mL (ref <0.03)

## 2018-11-08 LAB — BETA-HYDROXYBUTYRIC ACID: Beta-Hydroxybutyric Acid: 2.29 mmol/L — ABNORMAL HIGH (ref 0.05–0.27)

## 2018-11-08 MED ORDER — INSULIN REGULAR(HUMAN) IN NACL 100-0.9 UT/100ML-% IV SOLN
INTRAVENOUS | Status: DC
  Administered 2018-11-08: 3.7 [IU]/h via INTRAVENOUS
  Filled 2018-11-08: qty 100

## 2018-11-08 MED ORDER — SODIUM CHLORIDE 0.9 % IV SOLN
2.0000 g | Freq: Once | INTRAVENOUS | Status: AC
  Administered 2018-11-08: 17:00:00 2 g via INTRAVENOUS
  Filled 2018-11-08: qty 20

## 2018-11-08 MED ORDER — SODIUM CHLORIDE 0.9 % IV BOLUS (SEPSIS)
1000.0000 mL | Freq: Once | INTRAVENOUS | Status: AC
  Administered 2018-11-08: 1000 mL via INTRAVENOUS

## 2018-11-08 MED ORDER — ACETAMINOPHEN 325 MG PO TABS
650.0000 mg | ORAL_TABLET | Freq: Four times a day (QID) | ORAL | Status: DC | PRN
  Administered 2018-11-20 – 2018-11-24 (×5): 650 mg via ORAL
  Filled 2018-11-08 (×6): qty 2

## 2018-11-08 MED ORDER — ONDANSETRON HCL 4 MG/2ML IJ SOLN
4.0000 mg | Freq: Four times a day (QID) | INTRAMUSCULAR | Status: DC | PRN
  Administered 2018-11-15: 4 mg via INTRAVENOUS
  Filled 2018-11-08: qty 2

## 2018-11-08 MED ORDER — SODIUM CHLORIDE 0.9 % IV SOLN: INTRAVENOUS | Status: DC

## 2018-11-08 MED ORDER — POTASSIUM CHLORIDE 10 MEQ/100ML IV SOLN
10.0000 meq | INTRAVENOUS | Status: AC
  Administered 2018-11-08 (×4): 10 meq via INTRAVENOUS
  Filled 2018-11-08 (×4): qty 100

## 2018-11-08 MED ORDER — ORAL CARE MOUTH RINSE
15.0000 mL | Freq: Two times a day (BID) | OROMUCOSAL | Status: DC
  Administered 2018-11-09 – 2018-11-28 (×28): 15 mL via OROMUCOSAL

## 2018-11-08 MED ORDER — DILTIAZEM HCL ER COATED BEADS 300 MG PO CP24
300.0000 mg | ORAL_CAPSULE | Freq: Every day | ORAL | Status: DC
  Administered 2018-11-09: 300 mg via ORAL
  Filled 2018-11-08 (×2): qty 1

## 2018-11-08 MED ORDER — INSULIN REGULAR BOLUS VIA INFUSION
0.0000 [IU] | Freq: Three times a day (TID) | INTRAVENOUS | Status: DC
  Administered 2018-11-10: 3 [IU] via INTRAVENOUS
  Administered 2018-11-10: 5 [IU] via INTRAVENOUS
  Filled 2018-11-08: qty 10

## 2018-11-08 MED ORDER — ONDANSETRON HCL 4 MG PO TABS: 4.0000 mg | ORAL_TABLET | Freq: Four times a day (QID) | ORAL | Status: DC | PRN

## 2018-11-08 MED ORDER — VANCOMYCIN HCL IN DEXTROSE 750-5 MG/150ML-% IV SOLN
750.0000 mg | INTRAVENOUS | Status: DC
  Administered 2018-11-10: 750 mg via INTRAVENOUS
  Filled 2018-11-08: qty 150

## 2018-11-08 MED ORDER — LEVOTHYROXINE SODIUM 50 MCG PO TABS
50.0000 ug | ORAL_TABLET | Freq: Every day | ORAL | Status: DC
  Administered 2018-11-09 – 2018-11-29 (×21): 50 ug via ORAL
  Filled 2018-11-08 (×21): qty 1

## 2018-11-08 MED ORDER — SODIUM CHLORIDE 0.9 % IV SOLN: 2.0000 g | INTRAVENOUS | Status: DC

## 2018-11-08 MED ORDER — ALLOPURINOL 300 MG PO TABS
300.0000 mg | ORAL_TABLET | Freq: Every day | ORAL | Status: DC
  Administered 2018-11-09 – 2018-11-25 (×18): 300 mg via ORAL
  Filled 2018-11-08: qty 1
  Filled 2018-11-08 (×4): qty 3
  Filled 2018-11-08 (×6): qty 1
  Filled 2018-11-08 (×3): qty 3
  Filled 2018-11-08 (×3): qty 1
  Filled 2018-11-08: qty 3
  Filled 2018-11-08: qty 1

## 2018-11-08 MED ORDER — METRONIDAZOLE IN NACL 5-0.79 MG/ML-% IV SOLN
500.0000 mg | Freq: Three times a day (TID) | INTRAVENOUS | Status: DC
  Administered 2018-11-09 – 2018-11-13 (×14): 500 mg via INTRAVENOUS
  Filled 2018-11-08 (×15): qty 100

## 2018-11-08 MED ORDER — PRAVASTATIN SODIUM 40 MG PO TABS
80.0000 mg | ORAL_TABLET | Freq: Every day | ORAL | Status: DC
  Administered 2018-11-09 – 2018-11-28 (×20): 80 mg via ORAL
  Filled 2018-11-08: qty 4
  Filled 2018-11-08: qty 2
  Filled 2018-11-08: qty 4
  Filled 2018-11-08 (×5): qty 2
  Filled 2018-11-08 (×5): qty 4
  Filled 2018-11-08 (×8): qty 2

## 2018-11-08 MED ORDER — VANCOMYCIN HCL 10 G IV SOLR
2500.0000 mg | Freq: Once | INTRAVENOUS | Status: AC
  Administered 2018-11-08: 2500 mg via INTRAVENOUS
  Filled 2018-11-08: qty 2500

## 2018-11-08 MED ORDER — SODIUM CHLORIDE 0.9 % IV BOLUS (SEPSIS)
1000.0000 mL | Freq: Once | INTRAVENOUS | Status: AC
  Administered 2018-11-08: 17:00:00 1000 mL via INTRAVENOUS

## 2018-11-08 MED ORDER — METOPROLOL SUCCINATE ER 25 MG PO TB24
25.0000 mg | ORAL_TABLET | Freq: Every day | ORAL | Status: DC
  Administered 2018-11-09: 16:00:00 25 mg via ORAL
  Filled 2018-11-08: qty 1

## 2018-11-08 MED ORDER — CALCITRIOL 0.25 MCG PO CAPS
0.2500 ug | ORAL_CAPSULE | Freq: Every day | ORAL | Status: DC
  Administered 2018-11-09 – 2018-11-29 (×22): 0.25 ug via ORAL
  Filled 2018-11-08 (×22): qty 1

## 2018-11-08 MED ORDER — HYDROCODONE-ACETAMINOPHEN 5-325 MG PO TABS
1.0000 | ORAL_TABLET | ORAL | Status: DC | PRN
  Administered 2018-11-10: 2 via ORAL
  Administered 2018-11-10: 1 via ORAL
  Administered 2018-11-13: 2 via ORAL
  Filled 2018-11-08: qty 2
  Filled 2018-11-08 (×2): qty 1
  Filled 2018-11-08: qty 2

## 2018-11-08 MED ORDER — ACETAMINOPHEN 650 MG RE SUPP: 650.0000 mg | Freq: Four times a day (QID) | RECTAL | Status: DC | PRN

## 2018-11-08 MED ORDER — INSULIN REGULAR(HUMAN) IN NACL 100-0.9 UT/100ML-% IV SOLN
INTRAVENOUS | Status: DC
  Filled 2018-11-08: qty 100

## 2018-11-08 MED ORDER — DEXTROSE 50 % IV SOLN: 25.0000 mL | INTRAVENOUS | Status: DC | PRN

## 2018-11-08 MED ORDER — VANCOMYCIN HCL IN DEXTROSE 1-5 GM/200ML-% IV SOLN: 1000.0000 mg | Freq: Once | INTRAVENOUS | Status: DC

## 2018-11-08 MED ORDER — HYDROMORPHONE HCL 1 MG/ML IJ SOLN
0.5000 mg | Freq: Once | INTRAMUSCULAR | Status: AC
  Administered 2018-11-08: 0.5 mg via INTRAVENOUS
  Filled 2018-11-08: qty 1

## 2018-11-08 MED ORDER — CLONIDINE HCL 0.1 MG PO TABS
0.1000 mg | ORAL_TABLET | Freq: Two times a day (BID) | ORAL | Status: DC
  Administered 2018-11-09 – 2018-11-29 (×40): 0.1 mg via ORAL
  Filled 2018-11-08 (×41): qty 1

## 2018-11-08 MED ORDER — DEXTROSE-NACL 5-0.45 % IV SOLN
INTRAVENOUS | Status: DC
  Administered 2018-11-09 – 2018-11-10 (×2): via INTRAVENOUS

## 2018-11-08 MED ORDER — SODIUM BICARBONATE 650 MG PO TABS
650.0000 mg | ORAL_TABLET | Freq: Four times a day (QID) | ORAL | Status: DC
  Administered 2018-11-09 – 2018-11-14 (×21): 650 mg via ORAL
  Filled 2018-11-08 (×20): qty 1

## 2018-11-08 MED ORDER — MORPHINE SULFATE (PF) 2 MG/ML IV SOLN: 2.0000 mg | INTRAVENOUS | Status: DC | PRN

## 2018-11-08 MED ORDER — ONDANSETRON HCL 4 MG/2ML IJ SOLN
4.0000 mg | Freq: Once | INTRAMUSCULAR | Status: AC
  Administered 2018-11-08: 4 mg via INTRAVENOUS
  Filled 2018-11-08: qty 2

## 2018-11-08 NOTE — ED Notes (Signed)
Bed: WA17 Expected date:  Expected time:  Means of arrival:  Comments: EMS-wound infection

## 2018-11-08 NOTE — Progress Notes (Signed)
Attempted to call the ED to receive report. No answer. Will try again in a few minutes.  Mariann Laster RN

## 2018-11-08 NOTE — Progress Notes (Signed)
A consult was received from an ED physician for vancomycin per pharmacy dosing.  The patient's profile has been reviewed for ht/wt/allergies/indication/available labs.   A one time order has been placed for vancomycin 2500 mg.    Further antibiotics/pharmacy consults should be ordered by admitting physician if indicated.                       Thank you,  Eudelia Bunch, Pharm.D [phone] 11/08/2018 4:18 PM

## 2018-11-08 NOTE — ED Notes (Signed)
Pt. [facility] 315. RN,Sarah made aware.

## 2018-11-08 NOTE — ED Notes (Signed)
Pt with painful, reddened area to left buttock.  Area is firm, warm to touch. Pt stating that it is 10/10 pain.

## 2018-11-08 NOTE — ED Notes (Addendum)
Date and time results received: 11/08/18 4:19 PM  Test: Lactic Acid Critical Value: 2.8  Name of Provider Notified: Abigail PA  Orders Received? Or Actions Taken?: awaiting orders

## 2018-11-08 NOTE — Progress Notes (Signed)
Pharmacy Antibiotic Note  Emily Hughes is a 78 y.o. female admitted on 11/08/2018 with cellulitis/abscess of buttocks.  Pharmacy has been consulted for Vancomycin & Cefepime dosing. 11/08/2018:  Afebrile  WBC, LA elevated  Stage IV CKD   Plan: -Cefepime 2gm IV q24h -Vancomycin 750mg  IV q48h -Monitor renal function and cx data   Height: 5\' 3"  (160 cm) Weight: 246 lb (111.6 kg) IBW/kg (Calculated) : 52.4  Temp (24hrs), Avg:98.8 F (37.1 C), Min:98.7 F (37.1 C), Max:98.8 F (37.1 C)  Recent Labs  Lab 11/08/18 1533 11/08/18 1856  WBC 27.5*  --   CREATININE 4.76*  --   LATICACIDVEN 2.8* 1.7    Estimated Creatinine Clearance: 12.1 mL/min (A) (by C-G formula based on SCr of 4.76 mg/dL (H)).    Allergies  Allergen Reactions  . Ace Inhibitors   . Actos [Pioglitazone Hydrochloride]   . Dust Mite Extract   . Dye Fdc Red [Erythrosine Red No. 3]     Antimicrobials this admission: 5/6 Rocephin x1 in ED 5/6 Cefepime >>  5/6 Vancomycin >>  5/6 Flagyl>>  Dose adjustments this admission:  Microbiology results: 5/6 BCx:  5/6 SARS CoV2: negative 5/6 UCx:    Thank you for allowing pharmacy to be a part of this patient's care.  Biagio Borg 11/08/2018 8:28 PM

## 2018-11-08 NOTE — ED Notes (Signed)
Admitting md at bedside

## 2018-11-08 NOTE — ED Provider Notes (Addendum)
[facility] DEPT Provider Note   CSN: [id number] Arrival date & time: 11/08/18  1432    History   Chief Complaint Chief Complaint  Patient presents with  . Hyperglycemia  . Wound Infection    HPI Emily Hughes is a 78 y.o. female brought in by EMS for high blood sugar and fever.  States with her sister helps care for her.  She has a past medical history of insulin-dependent diabetes, morbid obesity, hypertension, pancreatitis.  The patient complains of severe pain on the left side of her bottom and states she said has "a boil."  She states is been there for several days and is extremely painful.  She admits to having her insulin at home but says that she "just did not feel like taking it."  She denies cough.  She had a fever at home this morning reported to be 802 F.  She was given Tylenol just prior to arrival.  And also noted to be markedly hyperglycemic.     HPI  Past Medical History:  Diagnosis Date  . Anemia   . CAD (coronary artery disease)   . Colon polyps   . CRI (chronic renal insufficiency)   . DDD (degenerative disc disease)   . Diabetes mellitus    INSULIN DEPENDENT  . GERD (gastroesophageal reflux disease)   . High cholesterol   . Hypertension   . Pancreatitis   . Proteinuria     Patient Active Problem List   Diagnosis Date Noted  . Convulsions ([place]) 07/24/2015  . Type 2 diabetes mellitus with chronic kidney disease, without long-term current use of insulin ([place]) 07/24/2015  . Leukocytosis   . Chronic kidney disease (CKD), stage IV (severe) ([place]) 06/13/2015  . Seizures ([place]) 06/12/2015  . DKA (diabetic ketoacidoses) ([place]) 06/12/2015  . Seizure ([place]) 06/12/2015  . High cholesterol   . CAD (coronary artery disease)   . Proteinuria   . GERD (gastroesophageal reflux disease)   . DYSLIPIDEMIA 02/27/2010  . OBESITY, MORBID 02/27/2010  . DM 02/24/2010  . RHEUMATIC FEVER 02/24/2010  . Essential hypertension 02/24/2010  .  RENAL INSUFFICIENCY, CHRONIC 02/24/2010    Past Surgical History:  Procedure Laterality Date  . ABDOMINAL HYSTERECTOMY    . ANGIOPLASTY    . ANKLE SURGERY Right   . BACK SURGERY    . TONSILLECTOMY       OB History   No obstetric history on file.      Home Medications    Prior to Admission medications   Medication Sig Start Date End Date Taking? Authorizing Provider  allopurinol (ZYLOPRIM) 100 MG tablet Take 1 tablet (100 mg total) by mouth daily. Take this with the 300 mg to make 400 mg total 09/22/18  Yes Susy Frizzle, MD  allopurinol (ZYLOPRIM) 300 MG tablet Take 1 tablet (300 mg total) by mouth daily. 09/22/18  Yes Susy Frizzle, MD  aspirin 81 MG tablet Take 81 mg by mouth daily.     Yes [provider]  calcitRIOL (ROCALTROL) 0.25 MCG capsule Take 0.25 mcg by mouth daily.   Yes [provider]  cloNIDine (CATAPRES) 0.1 MG tablet Take 1 tablet (0.1 mg total) by mouth 2 (two) times daily. 07/19/14  Yes Susy Frizzle, MD  colchicine 0.6 MG tablet Take 0.5 tablets (0.3 mg total) by mouth daily. 10/10/18  Yes Susy Frizzle, MD  diltiazem (CARDIZEM CD) 300 MG 24 hr capsule Take 1 capsule (300 mg total) by mouth daily. 10/02/18  Yes Susy Frizzle, MD  furosemide (LASIX) 40 MG tablet TAKE 2 TABLETS IN THE MORNING AND 1 TABLET IN THE EVENING. 08/12/15  Yes Pickard, Cammie Mcgee, MD  glimepiride (AMARYL) 4 MG tablet TAKE 1 TABLET ONCE DAILY. 09/25/18  Yes Susy Frizzle, MD  insulin aspart (NOVOLOG) 100 UNIT/ML FlexPen Inject 10 Units into the skin daily with lunch. Patient taking differently: Inject 10 Units into the skin daily. Sliding Scale 07/24/14  Yes Susy Frizzle, MD  Insulin Glargine (LANTUS SOLOSTAR) 100 UNIT/ML Solostar Pen INJECT 0.30ML(30 UNITS) INTO THE SKIN DAILY. 09/25/18  Yes Susy Frizzle, MD  levothyroxine (SYNTHROID, LEVOTHROID) 50 MCG tablet Take 1 tablet (50 mcg total) by mouth daily. 10/02/18  Yes Susy Frizzle, MD   pravastatin (PRAVACHOL) 80 MG tablet Take 1 tablet by mouth at bedtime 09/27/18  Yes Susy Frizzle, MD  sodium bicarbonate 650 MG tablet Take 1 tablet (650 mg total) by mouth 4 (four) times daily. 07/19/14  Yes Susy Frizzle, MD  sucralfate (CARAFATE) 1 G tablet Take 1 tablet (1 g total) by mouth 4 (four) times daily -  with meals and at bedtime. 07/11/14  Yes Susy Frizzle, MD  VELTASSA 8.4 g packet Take 8.4 g by mouth daily.  12/20/17  Yes [provider]  B-D ULTRAFINE III SHORT PEN 31G X 8 MM MISC USE WITH LANTUS. 07/31/18   Susy Frizzle, MD  cetirizine (ZYRTEC) 10 MG tablet Take 1 tablet (10 mg total) by mouth daily. Patient not taking: Reported on 11/08/2018 12/04/13   Susy Frizzle, MD  colchicine 0.6 MG tablet TAKE 2 TABLETS NOW AND REPEAT 1 TABLET IN 1 HOUR AS NEEDED FOR GOUT FLARE Patient not taking: Reported on 11/08/2018 08/08/18   Susy Frizzle, MD  diclofenac sodium (VOLTAREN) 1 % GEL Apply 2 g topically 4 (four) times daily. Patient not taking: Reported on 11/08/2018 08/05/16   Susy Frizzle, MD  HYDROcodone-acetaminophen (NORCO) 5-325 MG tablet Take 1 tablet by mouth every 6 (six) hours as needed for moderate pain. Patient not taking: Reported on 11/08/2018 08/09/17   Susy Frizzle, MD  metoprolol succinate (TOPROL-XL) 25 MG 24 hr tablet Take 1 tablet by mouth once daily 09/27/18   Susy Frizzle, MD  mometasone (ELOCON) 0.1 % ointment APPLY TOPICALLY DAILY. Patient not taking: Reported on 11/08/2018 04/25/14   Susy Frizzle, MD  tiZANidine (ZANAFLEX) 2 MG tablet Take 1 tablet (2 mg total) by mouth 3 (three) times daily. Patient not taking: Reported on 11/08/2018 09/29/17   Susy Frizzle, MD    Family History Family History  Problem Relation Age of Onset  . Diabetes Mother   . Diabetes Father   . Diabetes Sister     Social History Social History   Tobacco Use  . Smoking status: Former Research scientist (life sciences)  . Smokeless tobacco: Never Used  . Tobacco  comment: quit smoking in 2000  Substance Use Topics  . Alcohol use: No    Alcohol/week: 0.0 standard drinks  . Drug use: No     Allergies   Ace inhibitors; Actos [pioglitazone hydrochloride]; Dust mite extract; and Dye fdc red [erythrosine red no. 3]   Review of Systems Review of Systems Ten systems reviewed and are negative for acute change, except as noted in the HPI.    Physical Exam Updated Vital Signs BP (!) 151/71   Pulse (!) 123   Temp 98.8 F (37.1 C) (Oral)   Resp Marland Kitchen)  21   Ht 5\' 3"  (1.6 m)   Wt 111.6 kg   SpO2 99%   BMI 43.58 kg/m   Physical Exam Vitals signs and nursing note reviewed.  Constitutional:      General: She is not in acute distress.    Appearance: She is well-developed. She is not diaphoretic.  HENT:     Head: Normocephalic and atraumatic.  Eyes:     General: No scleral icterus.    Conjunctiva/sclera: Conjunctivae normal.  Neck:     Musculoskeletal: Normal range of motion.  Cardiovascular:     Rate and Rhythm: Normal rate and regular rhythm.     Heart sounds: Normal heart sounds. No murmur. No friction rub. No gallop.   Pulmonary:     Effort: Pulmonary effort is normal. No respiratory distress.     Breath sounds: Normal breath sounds.  Abdominal:     General: Bowel sounds are normal. There is no distension.     Palpations: Abdomen is soft. There is no mass.     Tenderness: There is no abdominal tenderness. There is no guarding.  Skin:    General: Skin is warm and dry.     Comments: Left gluteus with thick induration along the entire gluteal cleft and extending about midway into the left ileus.  Exquisitely tender to palpation.  No palpated fluctuance.  Exam difficult secondary to body habitus  Neurological:     Mental Status: She is alert and oriented to person, place, and time.  Psychiatric:        Behavior: Behavior normal.      ED Treatments / Results  Labs (all labs ordered are listed, but only abnormal results are displayed)  Labs Reviewed  LACTIC ACID, PLASMA - Abnormal; Notable for the following components:      Result Value   Lactic Acid, Venous 2.8 (*)    All other components within normal limits  COMPREHENSIVE METABOLIC PANEL - Abnormal; Notable for the following components:   Sodium 129 (*)    Chloride 94 (*)    CO2 16 (*)    Glucose, Bld 516 (*)    BUN 125 (*)    Creatinine, Ser 4.76 (*)    Albumin 3.2 (*)    GFR calc non Af Amer 8 (*)    GFR calc Af Amer 10 (*)    Anion gap 19 (*)    All other components within normal limits  CBC WITH DIFFERENTIAL/PLATELET - Abnormal; Notable for the following components:   WBC 27.5 (*)    Hemoglobin 11.4 (*)    HCT 35.0 (*)    All other components within normal limits  CBG MONITORING, ED - Abnormal; Notable for the following components:   Glucose-Capillary 436 (*)    All other components within normal limits  CULTURE, BLOOD (ROUTINE X 2)  CULTURE, BLOOD (ROUTINE X 2)  URINE CULTURE  SARS CORONAVIRUS 2 (HOSPITAL ORDER, [facility])  LACTIC ACID, PLASMA  URINALYSIS, ROUTINE W REFLEX MICROSCOPIC  BLOOD GAS, VENOUS    EKG None  Radiology Dg Chest Port 1 View  Result Date: 11/08/2018 CLINICAL DATA:  78 year old female with buttock pain, hyperglycemia and fever EXAM: PORTABLE CHEST 1 VIEW COMPARISON:  Prior chest x-ray 06/12/2015 FINDINGS: The patient is rotated toward the right in the view is slightly lordotic as well. Stable cardiac and mediastinal contours. Calcified hilar nodes again noted bilaterally. The lungs are clear. Perhaps minimal atelectasis in the right lung base. No acute osseous  abnormality. IMPRESSION: Stable chest x-ray without evidence of acute cardiopulmonary process. Electronically Signed   By: Jacqulynn Cadet M.D.   On: 11/08/2018 16:08    Procedures .Critical Care Performed by: Margarita Mail, PA-C Authorized by: Margarita Mail, PA-C   Critical care provider statement:    Critical care time (minutes):   50   Critical care was necessary to treat or prevent imminent or life-threatening deterioration of the following conditions:  Sepsis and metabolic crisis   Critical care was time spent personally by me on the following activities:  Discussions with consultants, evaluation of patient's response to treatment, examination of patient, ordering and performing treatments and interventions, ordering and review of laboratory studies, ordering and review of radiographic studies, pulse oximetry, re-evaluation of patient's condition, obtaining history from patient or surrogate and review of old charts   (including critical care time)  Medications Ordered in ED Medications  sodium chloride 0.9 % bolus 1,000 mL (1,000 mLs Intravenous New Bag/Given 11/08/18 1532)    And  sodium chloride 0.9 % bolus 1,000 mL (1,000 mLs Intravenous New Bag/Given 11/08/18 1640)    And  sodium chloride 0.9 % bolus 1,000 mL (1,000 mLs Intravenous New Bag/Given 11/08/18 1646)  cefTRIAXone (ROCEPHIN) 2 g in sodium chloride 0.9 % 100 mL IVPB (2 g Intravenous New Bag/Given 11/08/18 1642)  vancomycin (VANCOCIN) 2,500 mg in sodium chloride 0.9 % 500 mL IVPB (has no administration in time range)  insulin regular, human (MYXREDLIN) 100 units/ 100 mL infusion (has no administration in time range)  dextrose 5 %-0.45 % sodium chloride infusion (has no administration in time range)  potassium chloride 10 mEq in 100 mL IVPB (has no administration in time range)  HYDROmorphone (DILAUDID) injection 0.5 mg (0.5 mg Intravenous Given 11/08/18 1642)  ondansetron (ZOFRAN) injection 4 mg (4 mg Intravenous Given 11/08/18 1642)     Initial Impression / Assessment and Plan / ED Course  I have reviewed the triage vital signs and the nursing notes.  Pertinent labs & imaging results that were available during my care of the patient were reviewed by me and considered in my medical decision making (see chart for details).  Clinical Course as of Nov 07 1649  Wed  Nov 08, 2018  1641 Anion gap(!): 19 [AH]  1642 Glucose(!!): 516 [AH]  1642 Sodium(!): 129 [AH]  1642 CO2(!): 16 [AH]    Clinical Course User Index [AH] Pearlie Lafosse, PA-C       4:51 PM BP (!) 151/71   Pulse (!) 123   Temp 98.8 F (37.1 C) (Oral)   Resp (!) 21   Ht 5\' 3"  (1.6 m)   Wt 111.6 kg   SpO2 99%   BMI 43.58 kg/m  Patient here with reported fever, tachycardia, tachypnea.  Code sepsis initiated.  She appears to have a large skin infection on the left gluteus.  I have initiated treatment with vancomycin and Rocephin. Patient also appears to be in DKA.  I have ordered an insulin drip.  Her creatinine is elevated and she has an acute kidney injury.  She will get a CT pelvis without contrast.  Expect admission.  May need to consult with surgery.  COVID testing initiated.  Emily Hughes was evaluated in Emergency Department on 11/08/2018 for the symptoms described in the history of present illness. She was evaluated in the context of the global COVID-19 pandemic, which necessitated consideration that the patient might be at risk for infection with the SARS-CoV-2 virus that causes COVID-19. Institutional  protocols and algorithms that pertain to the evaluation of patients at risk for COVID-19 are in a state of rapid change based on information released by regulatory bodies including the CDC and federal and state organizations. These policies and algorithms were followed during the patient's care in the ED.  CC: Buttock abscess- DKA /Sepsis VS: BP 120/64   Pulse (!) 113   Temp 98.7 F (37.1 C) (Oral)   Resp (!) 24   Ht 5\' 3"  (1.6 m)   Wt 111.6 kg   SpO2 93%   BMI 43.58 kg/m  QJ:JHERDEY is gathered by Patient and EMR. CXK:GYJEHUD has an anion gap greater than 12 mEq, the differential diagnosis includes a lactic acidosis (tissue hypoxia to include: Cardiogenic, septic, hemorrhagic shock, seizure, carbon monoxide or cyanide poisoning),  non-hypoxic causes: Hepatic or renal renal  failure, intestinal ischemia, diabetes with metformin use, ketoacidosis, infection, leukemia or lymphoma, drugs (including  toxic alcohols), AIDS, idiopathic) also diabetic/ starvation/alcoholic ketoacidosis (or a mix thereof) uremic acidosis, ethyl glycol toxicity, methanol toxicity, salicylateparaldehyde, isoniazid, iron, rhabdomyolysis. Labs: I reviewed the labs which show anion gap acidosis likely multifactorial from hyperglycemia and lactic acidosis.  Acute kidney injury with creatinine of 4.76.  Hyponatremia in the setting of highly elevated blood glucose leukocytosis of 27.5.  Patient's coronavirus test is negative Imaging: I personally reviewed the images (chest x-ray and CT pelvis) which show(s) no acute abnormalities on 1 view chest film, and large infection in the left buttocks with subcutaneous air.  I reviewed the film with Dr. Marlou Starks of general surgery who will consult on the patient. EKG: Tachycardia with signs of demand ischemia. MDM: Patient here with DKA, sepsis likely stemming from a source of infection in the left buttock.  Surgery consulted.  Patient does have some signs of ischemia on her EKG however her heart rate has been significantly elevated I do not feel that ischemia is the cause of her DKA but rather the demand is causing some ischemia.  She is not complaining of any chest pain or shortness of breath.  She is complaining of pain in her buttock.  Patient will be admitted to the hospitalist service. Patient disposition:admit Patient condition: serious. The patient appears reasonably stabilized for admission considering the current resources, flow, and capabilities available in the ED at this time, and I doubt any other [facility] requiring further screening and/or treatment in the ED prior to admission.   Final Clinical Impressions(s) / ED Diagnoses   Final diagnoses:  Type 2 diabetes mellitus with ketoacidosis without coma, unspecified whether long term insulin use (HCC)  Sepsis, due  to unspecified organism, unspecified whether acute organ dysfunction present [facility])  Cellulitis and abscess of buttock    ED Discharge Orders    None       Margarita Mail, PA-C 11/08/18 2051    Margarita Mail, PA-C 11/08/18 2052    Drenda Freeze, MD 11/08/18 [phone]

## 2018-11-08 NOTE — ED Notes (Signed)
Date and time results received: 11/08/18 7:53 PM  Test: Troponin Critical Value: 0.09  Name of Provider Notified: [place]  Orders Received? Or Actions Taken?:

## 2018-11-08 NOTE — ED Notes (Signed)
Attempted to call report at [phone], no answer

## 2018-11-08 NOTE — ED Notes (Signed)
Pt placed on purewick 

## 2018-11-08 NOTE — ED Triage Notes (Signed)
Pt BIBA from home c/o pain to buttocks.  Sister reports pt with temperature of 102.0 - family gave Tylenol around 1200.  EMS reporting temp 99  CBG 463- pt not had insulin in 2 days.   Pt with hx of afib.

## 2018-11-08 NOTE — ED Notes (Signed)
ED TO INPATIENT HANDOFF REPORT  Name/Age/Gender Emily Hughes 78 y.o. female  Code Status    Code Status Orders  (From admission, onward)         Start     Ordered   11/08/18 2024  Full code  Continuous     11/08/18 2023        Code Status History    Date Active Date Inactive Code Status Order ID Comments User Context   06/12/2015 1459 06/14/2015 1838 Full Code [id number]  [facility]      Home/SNF/Other Home  Chief Complaint hyperglycemia;wound infection  Level of Care/Admitting Diagnosis ED Disposition    ED Disposition Condition [facility] Area: [facility] [[id number]]  Level of Care: Stepdown [14]  Admit to SDU based on following criteria: Hemodynamic compromise or significant risk of instability:  Patient requiring short term acute titration and management of vasoactive drips, and invasive monitoring (i.e., CVP and Arterial line).  Covid Evaluation: N/A  Diagnosis: Abscess of left buttock [751025]  Admitting Physician: Toy Baker [[id number]]  Attending Physician: Toy Baker [[id number]]  Estimated length of stay: 3 - 4 days  Certification:: I certify this patient will need inpatient services for at least 2 midnights  PT Class (Do Not Modify): Inpatient [101]  PT Acc Code (Do Not Modify): Private [1]       Medical History Past Medical History:  Diagnosis Date  . Anemia   . CAD (coronary artery disease)   . Colon polyps   . CRI (chronic renal insufficiency)   . DDD (degenerative disc disease)   . Diabetes mellitus    INSULIN DEPENDENT  . GERD (gastroesophageal reflux disease)   . High cholesterol   . Hypertension   . Pancreatitis   . Proteinuria     Allergies Allergies  Allergen Reactions  . Ace Inhibitors   . Actos [Pioglitazone Hydrochloride]   . Dust Mite Extract   . Dye Fdc Red [Erythrosine Red No. 3]     IV Location/Drains/Wounds Patient Lines/Drains/Airways Status   Active  Line/Drains/Airways    Name:   Placement date:   Placement time:   Site:   Days:   Peripheral IV 11/08/18 Right Antecubital   11/08/18    1532    Antecubital   less than 1   Peripheral IV 11/08/18 Left Antecubital   11/08/18    1542    Antecubital   less than 1   Peripheral IV   -    -    -             Labs/Imaging Results for orders placed or performed during the hospital encounter of 11/08/18 (from the past 48 hour(s))  CBG monitoring, ED     Status: Abnormal   Collection Time: 11/08/18  2:48 PM  Result Value Ref Range   Glucose-Capillary 436 (H) 70 - 99 mg/dL  Lactic acid, plasma     Status: Abnormal   Collection Time: 11/08/18  3:33 PM  Result Value Ref Range   Lactic Acid, Venous 2.8 (HH) 0.5 - 1.9 mmol/L    Comment: CRITICAL RESULT CALLED TO, READ BACK BY AND VERIFIED WITHLarey Days [id number] @ [place] Performed at [facility], [place] [address], [place], [place] [address]   Comprehensive metabolic panel     Status: Abnormal   Collection Time: 11/08/18  3:33 PM  Result Value Ref Range   Sodium 129 (L) 135 -  145 mmol/L   Potassium 5.1 3.5 - 5.1 mmol/L   Chloride 94 (L) 98 - 111 mmol/L   CO2 16 (L) 22 - 32 mmol/L   Glucose, Bld 516 (HH) 70 - 99 mg/dL    Comment: CRITICAL RESULT CALLED TO, READ BACK BY AND VERIFIED WITH: R BARHAM,RN [id number] @ 1633 BY J SCOTTON    BUN 125 (H) 8 - 23 mg/dL    Comment: RESULTS CONFIRMED BY MANUAL DILUTION   Creatinine, Ser 4.76 (H) 0.44 - 1.00 mg/dL   Calcium 9.6 8.9 - 10.3 mg/dL   Total Protein 7.8 6.5 - 8.1 g/dL   Albumin 3.2 (L) 3.5 - 5.0 g/dL   AST 31 15 - 41 U/L   ALT 32 0 - 44 U/L   Alkaline Phosphatase 113 38 - 126 U/L   Total Bilirubin 0.7 0.3 - 1.2 mg/dL   GFR calc non Af Amer 8 (L) >60 mL/min   GFR calc Af Amer 10 (L) >60 mL/min   Anion gap 19 (H) 5 - 15    Comment: Performed at [facility], [place] [address] [place], [place] [address]  CBC WITH DIFFERENTIAL     Status: Abnormal    Collection Time: 11/08/18  3:33 PM  Result Value Ref Range   WBC 27.5 (H) 4.0 - 10.5 K/uL    Comment: WHITE COUNT CONFIRMED ON SMEAR   RBC 4.19 3.87 - 5.11 MIL/uL   Hemoglobin 11.4 (L) 12.0 - 15.0 g/dL   HCT 35.0 (L) 36.0 - 46.0 %   MCV 83.5 80.0 - 100.0 fL   MCH 27.2 26.0 - 34.0 pg   MCHC 32.6 30.0 - 36.0 g/dL   RDW 14.9 11.5 - 15.5 %   Platelets 285 150 - 400 K/uL   nRBC 0.0 0.0 - 0.2 %   Neutrophils Relative % 89 %   Neutro Abs 24.5 (H) 1.7 - 7.7 K/uL   Lymphocytes Relative 3 %   Lymphs Abs 0.9 0.7 - 4.0 K/uL   Monocytes Relative 5 %   Monocytes Absolute 1.4 (H) 0.1 - 1.0 K/uL   Eosinophils Relative 1 %   Eosinophils Absolute 0.2 0.0 - 0.5 K/uL   Basophils Relative 0 %   Basophils Absolute 0.1 0.0 - 0.1 K/uL   WBC Morphology VACUOLATED NEUTROPHILS    Immature Granulocytes 2 %   Abs Immature Granulocytes 0.45 (H) 0.00 - 0.07 K/uL    Comment: Performed at [facility], [place] [address], [place], [place] [address]  SARS Coronavirus 2 (CEPHEID- Performed in [place] hospital lab), Hosp Order     Status: None   Collection Time: 11/08/18  4:07 PM  Result Value Ref Range   SARS Coronavirus 2 NEGATIVE NEGATIVE    Comment: (NOTE) If result is NEGATIVE SARS-CoV-2 target nucleic acids are NOT DETECTED. The SARS-CoV-2 RNA is generally detectable in upper and lower  respiratory specimens during the acute phase of infection. The lowest  concentration of SARS-CoV-2 viral copies this assay can detect is 250  copies / mL. A negative result does not preclude SARS-CoV-2 infection  and should not be used as the sole basis for treatment or other  patient management decisions.  A negative result may occur with  improper specimen collection / handling, submission of specimen other  than nasopharyngeal swab, presence of viral mutation(s) within the  areas targeted by this assay, and inadequate number of viral copies  (<250 copies / mL). A negative result must be combined  with clinical  observations, patient history, and epidemiological information. If result is POSITIVE SARS-CoV-2 target nucleic acids are DETECTED. The SARS-CoV-2 RNA is generally detectable in upper and lower  respiratory specimens dur ing the acute phase of infection.  Positive  results are indicative of active infection with SARS-CoV-2.  Clinical  correlation with patient history and other diagnostic information is  necessary to determine patient infection status.  Positive results do  not rule out bacterial infection or co-infection with other viruses. If result is PRESUMPTIVE POSTIVE SARS-CoV-2 nucleic acids MAY BE PRESENT.   A presumptive positive result was obtained on the submitted specimen  and confirmed on repeat testing.  While 2019 novel coronavirus  (SARS-CoV-2) nucleic acids may be present in the submitted sample  additional confirmatory testing may be necessary for epidemiological  and / or clinical management purposes  to differentiate between  SARS-CoV-2 and other Sarbecovirus currently known to infect humans.  If clinically indicated additional testing with an alternate test  methodology [phone]) is advised. The SARS-CoV-2 RNA is generally  detectable in upper and lower respiratory sp ecimens during the acute  phase of infection. The expected result is Negative. Fact Sheet for Patients:  StrictlyIdeas.no Fact Sheet for Healthcare Providers: BankingDealers.co.za This test is not yet approved or cleared by the [place] FDA and has been authorized for detection and/or diagnosis of SARS-CoV-2 by FDA under an Emergency Use Authorization (EUA).  This EUA will remain in effect (meaning this test can be used) for the duration of the COVID-19 declaration under Section 564(b)(1) of the Act, 21 U.S.C. section 360bbb-3(b)(1), unless the authorization is terminated or revoked sooner. Performed at [facility], [place] [address], [place], [place] [address]   Blood gas, venous     Status: Abnormal   Collection Time: 11/08/18  4:39 PM  Result Value Ref Range   pH, Ven 7.286 7.250 - 7.430   pCO2, Ven 37.4 (L) 44.0 - 60.0 mmHg   pO2, Ven 34.6 32.0 - 45.0 mmHg   Bicarbonate 17.2 (L) 20.0 - 28.0 mmol/L   Acid-Base Excess 8.2 (H) 0.0 - 2.0 mmol/L   Acid-base deficit 8.1 (H) 0.0 - 2.0 mmol/L   O2 Saturation 55.7 %   Patient temperature 37.0    Collection site VEIN    Drawn by DRAWN BY RN    Sample type VENOUS     Comment: Performed at [facility] [address], [place], [place] [address]  CBG monitoring, ED     Status: Abnormal   Collection Time: 11/08/18  6:21 PM  Result Value Ref Range   Glucose-Capillary 427 (H) 70 - 99 mg/dL  Lactic acid, plasma     Status: None   Collection Time: 11/08/18  6:56 PM  Result Value Ref Range   Lactic Acid, Venous 1.7 0.5 - 1.9 mmol/L    Comment: Performed at [facility], [place] [address] [place], [place] [address]  Beta-hydroxybutyric acid     Status: Abnormal   Collection Time: 11/08/18  6:56 PM  Result Value Ref Range   Beta-Hydroxybutyric Acid 2.29 (H) 0.05 - 0.27 mmol/L    Comment: Performed at [facility], [place] [address], [place], [place] [address]  Troponin I -     Status: Abnormal   Collection Time: 11/08/18  6:56 PM  Result Value Ref Range   Troponin I 0.09 (HH) <0.03 ng/mL    Comment: CRITICAL RESULT CALLED TO, READ BACK BY AND VERIFIED WITH:  [place] 11/08/18 A NAVARRO Performed at [facility], [place] [address], [place], [place] [address]   CBG monitoring, ED     Status: Abnormal   Collection Time: 11/08/18  7:44 PM  Result Value Ref Range   Glucose-Capillary 405 (H) 70 - 99 mg/dL  Basic metabolic panel     Status: Abnormal   Collection Time: 11/08/18  8:37 PM  Result Value Ref Range   Sodium 131 (L) 135 - 145 mmol/L   Potassium 4.9 3.5 - 5.1  mmol/L   Chloride 101 98 - 111 mmol/L   CO2 16 (L) 22 - 32 mmol/L   Glucose, Bld 458 (H) 70 - 99 mg/dL   BUN 121 (H) 8 - 23 mg/dL    Comment: RESULTS CONFIRMED BY MANUAL DILUTION   Creatinine, Ser 4.22 (H) 0.44 - 1.00 mg/dL   Calcium 8.4 (L) 8.9 - 10.3 mg/dL   GFR calc non Af Amer 10 (L) >60 mL/min   GFR calc Af Amer 11 (L) >60 mL/min   Anion gap 14 5 - 15    Comment: Performed at [facility], [place] [address], [place], [place] [address]  Lactic acid, plasma     Status: Abnormal   Collection Time: 11/08/18  8:37 PM  Result Value Ref Range   Lactic Acid, Venous 2.0 (HH) 0.5 - 1.9 mmol/L    Comment: CRITICAL RESULT CALLED TO, READ BACK BY AND VERIFIED WITH: RN Sabriya Yono AT 2119 11/08/18 CRUICKSHANK A Performed at [facility], [place] [address] [place], [place] [address]   Procalcitonin     Status: None   Collection Time: 11/08/18  8:37 PM  Result Value Ref Range   Procalcitonin 6.89 ng/mL    Comment:        Interpretation: PCT > 2 ng/mL: Systemic infection (sepsis) is likely, unless other causes are known. (NOTE)       Sepsis PCT Algorithm           Lower Respiratory Tract                                      Infection PCT Algorithm    ----------------------------     ----------------------------         PCT < 0.25 ng/mL                PCT < 0.10 ng/mL         Strongly encourage             Strongly discourage   discontinuation of antibiotics    initiation of antibiotics    ----------------------------     -----------------------------       PCT 0.25 - 0.50 ng/mL            PCT 0.10 - 0.25 ng/mL               OR       >80% decrease in PCT            Discourage initiation of                                            antibiotics      Encourage discontinuation  of antibiotics    ----------------------------     -----------------------------         PCT >= 0.50 ng/mL              PCT 0.26 - 0.50 ng/mL               AND        <80% decrease in PCT              Encourage initiation of                                             antibiotics       Encourage continuation           of antibiotics    ----------------------------     -----------------------------        PCT >= 0.50 ng/mL                  PCT > 0.50 ng/mL               AND         increase in PCT                  Strongly encourage                                      initiation of antibiotics    Strongly encourage escalation           of antibiotics                                     -----------------------------                                           PCT <= 0.25 ng/mL                                                 OR                                        > 80% decrease in PCT                                     Discontinue / Do not initiate                                             antibiotics Performed at [facility] [address], [place], [place] [address]   Protime-INR     Status: Abnormal   Collection Time: 11/08/18  8:37 PM  Result Value Ref Range   Prothrombin Time 16.5 (H) 11.4 - 15.2 seconds  INR 1.3 (H) 0.8 - 1.2    Comment: (NOTE) INR goal varies based on device and disease states. Performed at [facility], [place] [address], [place], [place] [address]   APTT     Status: None   Collection Time: 11/08/18  8:37 PM  Result Value Ref Range   aPTT 29 24 - 36 seconds    Comment: Performed at [facility], [place] [address], [place], [place] [address]  CBG monitoring, ED     Status: Abnormal   Collection Time: 11/08/18  9:11 PM  Result Value Ref Range   Glucose-Capillary 361 (H) 70 - 99 mg/dL   Ct Pelvis Wo Contrast  Result Date: 11/08/2018 CLINICAL DATA:  Painful red area to left buttock. EXAM: CT PELVIS WITHOUT CONTRAST TECHNIQUE: Multidetector CT imaging of the pelvis was performed following the standard protocol without intravenous contrast. COMPARISON:  10/03/2014  FINDINGS: Urinary Tract:  Bladder is moderately distended. Bowel: No small bowel or colonic dilatation within the visualized pelvis. Vascular/Lymphatic: Atherosclerotic calcification noted distal aorta and common iliac arteries. Reproductive: The uterus is surgically absent. There is no adnexal mass. Other:  No intraperitoneal free fluid. Musculoskeletal: No worrisome lytic or sclerotic osseous abnormality. 7.1 x 9.6 x 11.1 cm area of ill-defined, infiltrating edema/inflammation is identified in the medial left buttock region, adjacent to the intergluteal fold. There is gas in the fat, scattered through the area of edema. No focal or rim enhancing fluid collection evident. IMPRESSION: Edema and gas identified in the subcutaneous fat of the medial left gluteal region without associated abscess. Imaging features compatible with soft tissue infection/cellulitis. Electronically Signed   By: Misty Stanley M.D.   On: 11/08/2018 17:21   Dg Chest Port 1 View  Result Date: 11/08/2018 CLINICAL DATA:  78 year old female with buttock pain, hyperglycemia and fever EXAM: PORTABLE CHEST 1 VIEW COMPARISON:  Prior chest x-ray 06/12/2015 FINDINGS: The patient is rotated toward the right in the view is slightly lordotic as well. Stable cardiac and mediastinal contours. Calcified hilar nodes again noted bilaterally. The lungs are clear. Perhaps minimal atelectasis in the right lung base. No acute osseous abnormality. IMPRESSION: Stable chest x-ray without evidence of acute cardiopulmonary process. Electronically Signed   By: Jacqulynn Cadet M.D.   On: 11/08/2018 16:08    Pending Labs Unresulted Labs (From admission, onward)    Start     Ordered   11/09/18 0500  Magnesium  Tomorrow morning,   R    Comments:  Call MD if <1.5    11/08/18 2023   11/09/18 0500  Phosphorus  Tomorrow morning,   R     11/08/18 2023   11/09/18 0500  TSH  Once,   R    Comments:  Cancel if already done within 1 month and notify MD    11/08/18  2023   11/09/18 0500  Comprehensive metabolic panel  Once,   R    Comments:  Cal MD for K<3.5 or >5.0    11/08/18 2023   11/09/18 0500  CBC  Once,   R    Comments:  Call for hg <8.0    11/08/18 2023   11/09/18 0000  Troponin I - Now Then Q6H  Now then every 6 hours,   R     11/08/18 2049   11/08/18 2054  MRSA PCR Screening  Once,   R    Question:  Patient immune status  Answer:  Immunocompromised   11/08/18 2053   11/08/18  2023  Lactic acid, plasma  STAT Now then every 3 hours,   STAT     11/08/18 2022   11/08/18 0355  Basic metabolic panel  Now then every 4 hours,   R     11/08/18 1951   11/08/18 1508  Blood Culture (routine x 2)  BLOOD CULTURE X 2,   STAT     11/08/18 1510   11/08/18 1508  Urinalysis, Routine w reflex microscopic  ONCE - STAT,   STAT     11/08/18 1510   11/08/18 1508  Urine culture  ONCE - STAT,   STAT     11/08/18 1510          Vitals/Pain Today's Vitals   11/08/18 1846 11/08/18 1900 11/08/18 2000 11/08/18 2123  BP:  122/81 120/64 (!) 161/127  Pulse:  (!) 40 (!) 113 (!) 114  Resp:  19 (!) 24 (!) 22  Temp: 98.7 F (37.1 C)     TempSrc: Oral     SpO2:  96% 93% 97%  Weight:      Height:      PainSc:        Isolation Precautions Contact precautions  Medications Medications  dextrose 5 %-0.45 % sodium chloride infusion ( Intravenous Hold 11/08/18 1653)  potassium chloride 10 mEq in 100 mL IVPB (0 mEq Intravenous Stopped 11/08/18 2132)  insulin regular bolus via infusion 0-10 Units (has no administration in time range)  insulin regular, human (MYXREDLIN) 100 units/ 100 mL infusion (has no administration in time range)  dextrose 50 % solution 25 mL (has no administration in time range)  metroNIDAZOLE (FLAGYL) IVPB 500 mg (has no administration in time range)  0.9 %  sodium chloride infusion (has no administration in time range)  allopurinol (ZYLOPRIM) tablet 300 mg (has no administration in time range)  cloNIDine (CATAPRES) tablet 0.1 mg (has no  administration in time range)  diltiazem (CARDIZEM CD) 24 hr capsule 300 mg (has no administration in time range)  metoprolol succinate (TOPROL-XL) 24 hr tablet 25 mg (has no administration in time range)  pravastatin (PRAVACHOL) tablet 80 mg (has no administration in time range)  calcitRIOL (ROCALTROL) capsule 0.25 mcg (has no administration in time range)  levothyroxine (SYNTHROID) tablet 50 mcg (has no administration in time range)  sodium bicarbonate tablet 650 mg (has no administration in time range)  acetaminophen (TYLENOL) tablet 650 mg (has no administration in time range)    Or  acetaminophen (TYLENOL) suppository 650 mg (has no administration in time range)  HYDROcodone-acetaminophen (NORCO/VICODIN) 5-325 MG per tablet 1-2 tablet (has no administration in time range)  ondansetron (ZOFRAN) tablet 4 mg (has no administration in time range)    Or  ondansetron (ZOFRAN) injection 4 mg (has no administration in time range)  morphine 2 MG/ML injection 2-3 mg (has no administration in time range)  ceFEPIme (MAXIPIME) 2 g in sodium chloride 0.9 % 100 mL IVPB (has no administration in time range)  vancomycin (VANCOCIN) IVPB 750 mg/150 ml premix (has no administration in time range)  sodium chloride 0.9 % bolus 1,000 mL (0 mLs Intravenous Stopped 11/08/18 1602)    And  sodium chloride 0.9 % bolus 1,000 mL (0 mLs Intravenous Stopped 11/08/18 1834)    And  sodium chloride 0.9 % bolus 1,000 mL (0 mLs Intravenous Stopped 11/08/18 1902)  cefTRIAXone (ROCEPHIN) 2 g in sodium chloride 0.9 % 100 mL IVPB (0 g Intravenous Stopped 11/08/18 1712)  HYDROmorphone (DILAUDID) injection 0.5 mg (0.5 mg Intravenous  Given 11/08/18 1642)  ondansetron (ZOFRAN) injection 4 mg (4 mg Intravenous Given 11/08/18 1642)  vancomycin (VANCOCIN) 2,500 mg in sodium chloride 0.9 % 500 mL IVPB ( Intravenous Restarted 11/08/18 1835)    Mobility manual wheelchair

## 2018-11-08 NOTE — ED Notes (Signed)
Pt reports not taking her insulin for 2 days d/t not feeling well.  Pt reports still having insulin at home, has not checked CBG in "a few days"

## 2018-11-08 NOTE — ED Notes (Signed)
Attempted to call report at 2157, no answer.

## 2018-11-08 NOTE — Consult Note (Signed)
Reason for Consult:gluteal abscess Referring Physician: Dr. Pilar Jarvis is an 78 y.o. female.  HPI: The patient is a 78 year old black female who presents with pain in her gluteal area for the last 3 to 4 days.  She has been having fevers and chills at home.  She denies any chest pain or shortness of breath.  She came to the emergency department where she was noted to have an indurated area in the left medial gluteal fold.  She underwent a CT scan of the pelvis which did show what looks like an abscess in the subcutaneous tissue of the left medial gluteal fold.  She is also in diabetic ketoacidosis with a glucose well over 500  Past Medical History:  Diagnosis Date  . Anemia   . CAD (coronary artery disease)   . Colon polyps   . CRI (chronic renal insufficiency)   . DDD (degenerative disc disease)   . Diabetes mellitus    INSULIN DEPENDENT  . GERD (gastroesophageal reflux disease)   . High cholesterol   . Hypertension   . Pancreatitis   . Proteinuria     Past Surgical History:  Procedure Laterality Date  . ABDOMINAL HYSTERECTOMY    . ANGIOPLASTY    . ANKLE SURGERY Right   . BACK SURGERY    . TONSILLECTOMY      Family History  Problem Relation Age of Onset  . Diabetes Mother   . Diabetes Father   . Diabetes Sister     Social History:  reports that she has quit smoking. She has never used smokeless tobacco. She reports that she does not drink alcohol or use drugs.  Allergies:  Allergies  Allergen Reactions  . Ace Inhibitors   . Actos [Pioglitazone Hydrochloride]   . Dust Mite Extract   . Dye Fdc Red [Erythrosine Red No. 3]     Medications: I have reviewed the patient's current medications.  Results for orders placed or performed during the hospital encounter of 11/08/18 (from the past 48 hour(s))  CBG monitoring, ED     Status: Abnormal   Collection Time: 11/08/18  2:48 PM  Result Value Ref Range   Glucose-Capillary 436 (H) 70 - 99 mg/dL  Lactic  acid, plasma     Status: Abnormal   Collection Time: 11/08/18  3:33 PM  Result Value Ref Range   Lactic Acid, Venous 2.8 (HH) 0.5 - 1.9 mmol/L    Comment: CRITICAL RESULT CALLED TO, READ BACK BY AND VERIFIED WITHLarey Days [id number] @ [place] Performed at [facility] [address] [place], [place] [address]   Comprehensive metabolic panel     Status: Abnormal   Collection Time: 11/08/18  3:33 PM  Result Value Ref Range   Sodium 129 (L) 135 - 145 mmol/L   Potassium 5.1 3.5 - 5.1 mmol/L   Chloride 94 (L) 98 - 111 mmol/L   CO2 16 (L) 22 - 32 mmol/L   Glucose, Bld 516 (HH) 70 - 99 mg/dL    Comment: CRITICAL RESULT CALLED TO, READ BACK BY AND VERIFIED WITH: R BARHAM,RN [id number] @ 1633 BY J SCOTTON    BUN 125 (H) 8 - 23 mg/dL    Comment: RESULTS CONFIRMED BY MANUAL DILUTION   Creatinine, Ser 4.76 (H) 0.44 - 1.00 mg/dL   Calcium 9.6 8.9 - 10.3 mg/dL   Total Protein 7.8 6.5 - 8.1 g/dL   Albumin 3.2 (L) 3.5 - 5.0 g/dL   AST 31  15 - 41 U/L   ALT 32 0 - 44 U/L   Alkaline Phosphatase 113 38 - 126 U/L   Total Bilirubin 0.7 0.3 - 1.2 mg/dL   GFR calc non Af Amer 8 (L) >60 mL/min   GFR calc Af Amer 10 (L) >60 mL/min   Anion gap 19 (H) 5 - 15    Comment: Performed at [facility], [place] [address] [place], [place] [address]  CBC WITH DIFFERENTIAL     Status: Abnormal (Preliminary result)   Collection Time: 11/08/18  3:33 PM  Result Value Ref Range   WBC 27.5 (H) 4.0 - 10.5 K/uL    Comment: WHITE COUNT CONFIRMED ON SMEAR   RBC 4.19 3.87 - 5.11 MIL/uL   Hemoglobin 11.4 (L) 12.0 - 15.0 g/dL   HCT 35.0 (L) 36.0 - 46.0 %   MCV 83.5 80.0 - 100.0 fL   MCH 27.2 26.0 - 34.0 pg   MCHC 32.6 30.0 - 36.0 g/dL   RDW 14.9 11.5 - 15.5 %   Platelets 285 150 - 400 K/uL   nRBC 0.0 0.0 - 0.2 %    Comment: Performed at [facility], [place] [address] [place], [place] [address]   Neutrophils Relative % PENDING %   Neutro Abs PENDING 1.7 -  7.7 K/uL   Band Neutrophils PENDING %   Lymphocytes Relative PENDING %   Lymphs Abs PENDING 0.7 - 4.0 K/uL   Monocytes Relative PENDING %   Monocytes Absolute PENDING 0.1 - 1.0 K/uL   Eosinophils Relative PENDING %   Eosinophils Absolute PENDING 0.0 - 0.5 K/uL   Basophils Relative PENDING %   Basophils Absolute PENDING 0.0 - 0.1 K/uL   WBC Morphology PENDING    RBC Morphology PENDING    Smear Review PENDING    Other PENDING %   nRBC PENDING 0 /100 WBC   Metamyelocytes Relative PENDING %   Myelocytes PENDING %   Promyelocytes Relative PENDING %   Blasts PENDING %  SARS Coronavirus 2 (CEPHEID- Performed in [place] hospital lab), Hosp Order     Status: None   Collection Time: 11/08/18  4:07 PM  Result Value Ref Range   SARS Coronavirus 2 NEGATIVE NEGATIVE    Comment: (NOTE) If result is NEGATIVE SARS-CoV-2 target nucleic acids are NOT DETECTED. The SARS-CoV-2 RNA is generally detectable in upper and lower  respiratory specimens during the acute phase of infection. The lowest  concentration of SARS-CoV-2 viral copies this assay can detect is 250  copies / mL. A negative result does not preclude SARS-CoV-2 infection  and should not be used as the sole basis for treatment or other  patient management decisions.  A negative result may occur with  improper specimen collection / handling, submission of specimen other  than nasopharyngeal swab, presence of viral mutation(s) within the  areas targeted by this assay, and inadequate number of viral copies  (<250 copies / mL). A negative result must be combined with clinical  observations, patient history, and epidemiological information. If result is POSITIVE SARS-CoV-2 target nucleic acids are DETECTED. The SARS-CoV-2 RNA is generally detectable in upper and lower  respiratory specimens dur ing the acute phase of infection.  Positive  results are indicative of active infection with SARS-CoV-2.  Clinical  correlation with patient  history and other diagnostic information is  necessary to determine patient infection status.  Positive results do  not rule out bacterial infection or co-infection with other viruses. If  result is PRESUMPTIVE POSTIVE SARS-CoV-2 nucleic acids MAY BE PRESENT.   A presumptive positive result was obtained on the submitted specimen  and confirmed on repeat testing.  While 2019 novel coronavirus  (SARS-CoV-2) nucleic acids may be present in the submitted sample  additional confirmatory testing may be necessary for epidemiological  and / or clinical management purposes  to differentiate between  SARS-CoV-2 and other Sarbecovirus currently known to infect humans.  If clinically indicated additional testing with an alternate test  methodology [phone]) is advised. The SARS-CoV-2 RNA is generally  detectable in upper and lower respiratory sp ecimens during the acute  phase of infection. The expected result is Negative. Fact Sheet for Patients:  StrictlyIdeas.no Fact Sheet for Healthcare Providers: BankingDealers.co.za This test is not yet approved or cleared by the [place] FDA and has been authorized for detection and/or diagnosis of SARS-CoV-2 by FDA under an Emergency Use Authorization (EUA).  This EUA will remain in effect (meaning this test can be used) for the duration of the COVID-19 declaration under Section 564(b)(1) of the Act, 21 U.S.C. section 360bbb-3(b)(1), unless the authorization is terminated or revoked sooner. Performed at [facility], [place] [address], [place], [place] [address]   Blood gas, venous     Status: Abnormal   Collection Time: 11/08/18  4:39 PM  Result Value Ref Range   pH, Ven 7.286 7.250 - 7.430   pCO2, Ven 37.4 (L) 44.0 - 60.0 mmHg   pO2, Ven 34.6 32.0 - 45.0 mmHg   Bicarbonate 17.2 (L) 20.0 - 28.0 mmol/L   Acid-Base Excess 8.2 (H) 0.0 - 2.0 mmol/L   Acid-base deficit 8.1 (H) 0.0 - 2.0  mmol/L   O2 Saturation 55.7 %   Patient temperature 37.0    Collection site VEIN    Drawn by DRAWN BY RN    Sample type VENOUS     Comment: Performed at [facility] [address] [place], [place] [address]  CBG monitoring, ED     Status: Abnormal   Collection Time: 11/08/18  6:21 PM  Result Value Ref Range   Glucose-Capillary 427 (H) 70 - 99 mg/dL    Ct Pelvis Wo Contrast  Result Date: 11/08/2018 CLINICAL DATA:  Painful red area to left buttock. EXAM: CT PELVIS WITHOUT CONTRAST TECHNIQUE: Multidetector CT imaging of the pelvis was performed following the standard protocol without intravenous contrast. COMPARISON:  10/03/2014 FINDINGS: Urinary Tract:  Bladder is moderately distended. Bowel: No small bowel or colonic dilatation within the visualized pelvis. Vascular/Lymphatic: Atherosclerotic calcification noted distal aorta and common iliac arteries. Reproductive: The uterus is surgically absent. There is no adnexal mass. Other:  No intraperitoneal free fluid. Musculoskeletal: No worrisome lytic or sclerotic osseous abnormality. 7.1 x 9.6 x 11.1 cm area of ill-defined, infiltrating edema/inflammation is identified in the medial left buttock region, adjacent to the intergluteal fold. There is gas in the fat, scattered through the area of edema. No focal or rim enhancing fluid collection evident. IMPRESSION: Edema and gas identified in the subcutaneous fat of the medial left gluteal region without associated abscess. Imaging features compatible with soft tissue infection/cellulitis. Electronically Signed   By: Misty Stanley M.D.   On: 11/08/2018 17:21   Dg Chest Port 1 View  Result Date: 11/08/2018 CLINICAL DATA:  78 year old female with buttock pain, hyperglycemia and fever EXAM: PORTABLE CHEST 1 VIEW COMPARISON:  Prior chest x-ray 06/12/2015 FINDINGS: The patient is rotated toward the right in the view is slightly lordotic as  well. Stable cardiac and mediastinal contours.  Calcified hilar nodes again noted bilaterally. The lungs are clear. Perhaps minimal atelectasis in the right lung base. No acute osseous abnormality. IMPRESSION: Stable chest x-ray without evidence of acute cardiopulmonary process. Electronically Signed   By: Jacqulynn Cadet M.D.   On: 11/08/2018 16:08    Review of Systems  Constitutional: Positive for chills and fever.  HENT: Negative.   Eyes: Negative.   Respiratory: Negative.   Cardiovascular: Positive for leg swelling.  Gastrointestinal: Negative.   Genitourinary: Negative.   Musculoskeletal: Negative.   Skin: Negative.   Neurological: Negative.   Endo/Heme/Allergies: Negative.   Psychiatric/Behavioral: Negative.    Blood pressure 122/81, pulse (!) 40, temperature 98.7 F (37.1 C), temperature source Oral, resp. rate 19, height 5\' 3"  (1.6 m), weight 111.6 kg, SpO2 96 %. Physical Exam  Constitutional: She is oriented to person, place, and time.  Obese black female in bed with chills  HENT:  Head: Normocephalic and atraumatic.  Mouth/Throat: No oropharyngeal exudate.  Eyes: Pupils are equal, round, and reactive to light. Conjunctivae and EOM are normal.  Neck: Normal range of motion. Neck supple.  Cardiovascular: Regular rhythm and normal heart sounds.  tachycardic  Respiratory: Effort normal and breath sounds normal. No stridor. No respiratory distress.  GI: Soft. She exhibits no distension. There is no abdominal tenderness.  Genitourinary:    Genitourinary Comments: There is a 6-7 cm area of induration in the left medial gluteal fold. No drainage   Musculoskeletal: Normal range of motion.        General: Edema present. No tenderness.  Neurological: She is alert and oriented to person, place, and time. Coordination normal.  Skin: Skin is warm and dry. No rash noted.  Psychiatric: She has a normal mood and affect. Her behavior is normal. Thought content normal.    Assessment/Plan: The patient appears to have a left  gluteal fold abscess which is likely contributing to her diabetic ketoacidosis.  She also has some element of renal insufficiency.  At this point she will need medical admission to the ICU for management of her diabetic ketoacidosis.  Once her sugar is down and her electrolytes have been corrected then I think it would be safe to take her to the operating room for an incision and drainage of the gluteal abscess.  I have discussed this with anesthesia and they are in agreement with delaying surgery until her ketoacidosis is better controlled.  We will follow her closely with you.  I would start her immediately on broad-spectrum antibiotic therapy.  Autumn Messing III 11/08/2018, 7:30 PM

## 2018-11-08 NOTE — ED Notes (Signed)
EKG given to Hospitalist, Doutova,MD. For review.

## 2018-11-08 NOTE — H&P (Signed)
Emily Hughes TMH:[id number] DOB: [birth year] DOA: 11/08/2018     PCP: Susy Frizzle, MD   Outpatient Specialists:   CARDS:   Forgot his name NEphrology:   [facility]    Dr. William Hamburger    Patient arrived to ER on 11/08/18 at 1432  Patient coming from: home Lives alone,     Chief Complaint:  Chief Complaint  Patient presents with   Hyperglycemia   Wound Infection    HPI: Emily Hughes is a 78 y.o. female with medical history significant of DM 2, CKD stage IV, hypothyroidisms atrial fibrillation, CAD, GERD, HLD, HTN, History of seizure disorder  Presented with 3-day history of pain and swelling her buttocks today had a temperature up to 102 family gave her some Tylenol and it helped reduce the temperature down to 99 she have had elevated blood sugars for the past few days running in the 400s.  She has not been taking her insulin for the past 2 days and has not been checking blood glucose regularly She has had pain in the left buttock for the past 3 days has been severe 10 out of 10 this some redness and warmth to the touch So started to have a boil but then progressed. Otherwise no associated cough.  Reports she has not taken any of her BP meds for past 3 days No chest pain feels a bit lightheaded when she walks Infectious risk factors:  Reports  Fever      Regarding pertinent Chronic problems:  DM 2 on basal insulin 30 units every day.  Use sliding scale with meals, gout History of hypertension for which she takes clonidine diltiazem Toprol  seizures in December 2016 in the setting of hyperglycemia. She was started on Depakote in the ER. Her 24-hour EEG was normal. After being seizure-free for 2 years, repeat EEG done in 2018 was normal, and we tapered off Depakote in January 2019.   A.fib - on aspirin only metoprolol and diltiazem unsure why not on anticoagulation, Denies hx of GI bleeds or falls    While in ER: Initial presentation found to be  tachycardic up to 123 but afebrile 98.8 CT showed evidence of cellulitis over left buttock with some gas formation General surgery has been called in consult Patient was started on IV antibiotics vancomycin and rocephin She was noted to have elevated blood sugars initially with elevated lactic acid and elevated anion gap up to 19 with a working diagnosis of possible DKA versus sepsis She started on glucose stabilizer  COVID 19 negative The following Work up has been ordered so far:  Orders Placed This Encounter  Procedures   Blood Culture (routine x 2)   Urine culture   SARS Coronavirus 2 (CEPHEID- Performed in [place] [facility] hospital lab), Hosp Order   DG Chest Port 1 View   CT PELVIS WO CONTRAST   Lactic acid, plasma   Comprehensive metabolic panel   CBC WITH DIFFERENTIAL   Urinalysis, Routine w reflex microscopic   Blood gas, venous   Beta-hydroxybutyric acid   Diet NPO time specified   Insert peripheral IV x 2   Initiate Carrier Fluid Protocol   Cardiac monitoring   Consult to general surgery   Consult to hospitalist   Contact ([place]) Isolation   CBG monitoring, ED   CBG monitoring, ED   ED EKG 12-Lead   EKG 12-Lead      Following Medications were ordered in ER: Medications  vancomycin (VANCOCIN) 2,500 mg in  sodium chloride 0.9 % 500 mL IVPB (0 mg Intravenous Paused 11/08/18 1835)  insulin regular, human (MYXREDLIN) 100 units/ 100 mL infusion (3.7 Units/hr Intravenous New Bag/Given 11/08/18 1839)  dextrose 5 %-0.45 % sodium chloride infusion ( Intravenous Hold 11/08/18 1653)  potassium chloride 10 mEq in 100 mL IVPB (10 mEq Intravenous New Bag/Given 11/08/18 1745)  sodium chloride 0.9 % bolus 1,000 mL (0 mLs Intravenous Stopped 11/08/18 1602)    And  sodium chloride 0.9 % bolus 1,000 mL (0 mLs Intravenous Stopped 11/08/18 1834)    And  sodium chloride 0.9 % bolus 1,000 mL (1,000 mLs Intravenous New Bag/Given 11/08/18 1646)  cefTRIAXone (ROCEPHIN) 2 g in  sodium chloride 0.9 % 100 mL IVPB (0 g Intravenous Stopped 11/08/18 1712)  HYDROmorphone (DILAUDID) injection 0.5 mg (0.5 mg Intravenous Given 11/08/18 1642)  ondansetron (ZOFRAN) injection 4 mg (4 mg Intravenous Given 11/08/18 1642)        Consult Orders  (From admission, onward)         Start     Ordered   11/08/18 1757  Consult to hospitalist  Once    Provider:  Toy Baker, MD  Question Answer Comment  Place call to: [facility]   Reason for Consult Admit      11/08/18 1756          ER Provider Called:  General Surgery on board   Dr.Toth They Recommend admit to medicine  start IV antibiotics   Will see in AM:   Significant initial  Findings: Abnormal Labs Reviewed  LACTIC ACID, PLASMA - Abnormal; Notable for the following components:      Result Value   Lactic Acid, Venous 2.8 (*)    All other components within normal limits  COMPREHENSIVE METABOLIC PANEL - Abnormal; Notable for the following components:   Sodium 129 (*)    Chloride 94 (*)    CO2 16 (*)    Glucose, Bld 516 (*)    BUN 125 (*)    Creatinine, Ser 4.76 (*)    Albumin 3.2 (*)    GFR calc non Af Amer 8 (*)    GFR calc Af Amer 10 (*)    Anion gap 19 (*)    All other components within normal limits  CBC WITH DIFFERENTIAL/PLATELET - Abnormal; Notable for the following components:   WBC 27.5 (*)    Hemoglobin 11.4 (*)    HCT 35.0 (*)    All other components within normal limits  CBG MONITORING, ED - Abnormal; Notable for the following components:   Glucose-Capillary 436 (*)    All other components within normal limits  CBG MONITORING, ED - Abnormal; Notable for the following components:   Glucose-Capillary 427 (*)    All other components within normal limits     Otherwise labs showing:    Recent Labs  Lab 11/08/18 1533  NA 129*  K 5.1  CO2 16*  GLUCOSE 516*  BUN 125*  CREATININE 4.76*  CALCIUM 9.6    Cr      Up from baseline see below Lab Results  Component Value Date    CREATININE 4.76 (H) 11/08/2018   CREATININE 3.95 (H) 07/12/2017   CREATININE 3.51 (H) 05/09/2017    Recent Labs  Lab 11/08/18 1533  AST 31  ALT 32  ALKPHOS 113  BILITOT 0.7  PROT 7.8  ALBUMIN 3.2*      WBC        Component Value Date/Time   WBC 27.5 (H)  11/08/2018 1533    Lactic Acid, Venous    Component Value Date/Time   LATICACIDVEN 2.8 (HH) 11/08/2018 1533      VBG 7.286     pCO2, Ven 37.4 mmHg   pO2, Ven 34.6        HG/HCT  stable,       Component Value Date/Time   HGB 11.4 (L) 11/08/2018 1533   HCT 35.0 (L) 11/08/2018 1533        DM  labs:  HbA1C: 7.2 in Jan 2019  CBG: Recent Labs  Lab 11/08/18 1448 11/08/18 1821  GLUCAP 436* 427*      UA  ordered   CXR -  NON acute  CTabd/pelvis - 7.1 x 9.6 x 11.1 cm area of cellulitis gas in the fat scattered throughout area of edema but no focal rim-enhancing fluid     ECG:  Personally reviewed by me showing: HR : 130 Rhythm:  A.fib/flutter. W RVR,    no evidence of ischemic changes [id number]      ED Triage Vitals  Enc Vitals Group     BP 11/08/18 1442 114/74     Pulse Rate 11/08/18 1442 (!) 116     Resp 11/08/18 1442 (!) 22     Temp 11/08/18 1442 98.8 F (37.1 C)     Temp Source 11/08/18 1442 Oral     SpO2 11/08/18 1442 98 %     Weight 11/08/18 1450 246 lb (111.6 kg)     Height 11/08/18 1450 _0  (1.6 m)     Head Circumference --      Peak Flow --      Pain Score 11/08/18 1449 10     Pain Loc --      Pain Edu? --      Excl. in [place]? --   TMAX(24)@       Latest  Blood pressure (!) 137/46, pulse (!) 122, temperature 98.8 F (37.1 C), temperature source Oral, resp. rate (!) 27, height _1  (1.6 m), weight 111.6 kg, SpO2 95 %.    Hospitalist was called for admission for sepsis due to buttock cellulitis with associated hyperglycemia and possible DKA   Review of Systems:    Pertinent positives include: Fevers, chills, fatigue, buttock pain  Constitutional:  No weight loss, night  sweats,  weight loss  HEENT:  No headaches, Difficulty swallowing,Tooth/dental problems,Sore throat,  No sneezing, itching, ear ache, nasal congestion, post nasal drip,  Cardio-vascular:  No chest pain, Orthopnea, PND, anasarca, dizziness, palpitations.no Bilateral lower extremity swelling  GI:  No heartburn, indigestion, abdominal pain, nausea, vomiting, diarrhea, change in bowel habits, loss of appetite, melena, blood in stool, hematemesis Resp:  no shortness of breath at rest. No dyspnea on exertion, No excess mucus, no productive cough, No non-productive cough, No coughing up of blood.No change in color of mucus.No wheezing. Skin:  no rash or lesions. No jaundice GU:  no dysuria, change in color of urine, no urgency or frequency. No straining to urinate.  No flank pain.  Musculoskeletal:  No joint pain or no joint swelling. No decreased range of motion. No back pain.  Psych:  No change in mood or affect. No depression or anxiety. No memory loss.  Neuro: no localizing neurological complaints, no tingling, no weakness, no double vision, no gait abnormality, no slurred speech, no confusion  All systems reviewed and apart from [place] all are negative  Past Medical History:   Past Medical History:  Diagnosis Date  Anemia    CAD (coronary artery disease)    Colon polyps    CRI (chronic renal insufficiency)    DDD (degenerative disc disease)    Diabetes mellitus    INSULIN DEPENDENT   GERD (gastroesophageal reflux disease)    High cholesterol    Hypertension    Pancreatitis    Proteinuria       Past Surgical History:  Procedure Laterality Date   ABDOMINAL HYSTERECTOMY     ANGIOPLASTY     ANKLE SURGERY Right    BACK SURGERY     TONSILLECTOMY      Social History:  Ambulatory   independently       reports that she has quit smoking. She has never used smokeless tobacco. She reports that she does not drink alcohol or use drugs.     Family History:     Family History  Problem Relation Age of Onset   Diabetes Mother    Diabetes Father    Diabetes Sister     Allergies: Allergies  Allergen Reactions   Ace Inhibitors    Actos [Pioglitazone Hydrochloride]    Dust Mite Extract    Dye Fdc Red [Erythrosine Red No. 3]      Prior to Admission medications   Medication Sig Start Date End Date Taking? Authorizing Provider  allopurinol (ZYLOPRIM) 100 MG tablet Take 1 tablet (100 mg total) by mouth daily. Take this with the 300 mg to make 400 mg total 09/22/18  Yes Susy Frizzle, MD  allopurinol (ZYLOPRIM) 300 MG tablet Take 1 tablet (300 mg total) by mouth daily. 09/22/18  Yes Susy Frizzle, MD  aspirin 81 MG tablet Take 81 mg by mouth daily.     Yes [provider]  calcitRIOL (ROCALTROL) 0.25 MCG capsule Take 0.25 mcg by mouth daily.   Yes [provider]  cloNIDine (CATAPRES) 0.1 MG tablet Take 1 tablet (0.1 mg total) by mouth 2 (two) times daily. 07/19/14  Yes Susy Frizzle, MD  colchicine 0.6 MG tablet Take 0.5 tablets (0.3 mg total) by mouth daily. 10/10/18  Yes Susy Frizzle, MD  diltiazem (CARDIZEM CD) 300 MG 24 hr capsule Take 1 capsule (300 mg total) by mouth daily. 10/02/18  Yes Susy Frizzle, MD  furosemide (LASIX) 40 MG tablet TAKE 2 TABLETS IN THE MORNING AND 1 TABLET IN THE EVENING. 08/12/15  Yes Pickard, Cammie Mcgee, MD  glimepiride (AMARYL) 4 MG tablet TAKE 1 TABLET ONCE DAILY. 09/25/18  Yes Susy Frizzle, MD  insulin aspart (NOVOLOG) 100 UNIT/ML FlexPen Inject 10 Units into the skin daily with lunch. Patient taking differently: Inject 10 Units into the skin daily. Sliding Scale 07/24/14  Yes Susy Frizzle, MD  Insulin Glargine (LANTUS SOLOSTAR) 100 UNIT/ML Solostar Pen INJECT 0.30ML(30 UNITS) INTO THE SKIN DAILY. 09/25/18  Yes Susy Frizzle, MD  levothyroxine (SYNTHROID, LEVOTHROID) 50 MCG tablet Take 1 tablet (50 mcg total) by mouth daily. 10/02/18  Yes Susy Frizzle, MD   pravastatin (PRAVACHOL) 80 MG tablet Take 1 tablet by mouth at bedtime 09/27/18  Yes Susy Frizzle, MD  sodium bicarbonate 650 MG tablet Take 1 tablet (650 mg total) by mouth 4 (four) times daily. 07/19/14  Yes Susy Frizzle, MD  sucralfate (CARAFATE) 1 G tablet Take 1 tablet (1 g total) by mouth 4 (four) times daily -  with meals and at bedtime. 07/11/14  Yes Susy Frizzle, MD  VELTASSA 8.4 g packet Take 8.4 g  by mouth daily.  12/20/17  Yes [provider]  B-D ULTRAFINE III SHORT PEN 31G X 8 MM MISC USE WITH LANTUS. 07/31/18   Susy Frizzle, MD  cetirizine (ZYRTEC) 10 MG tablet Take 1 tablet (10 mg total) by mouth daily. Patient not taking: Reported on 11/08/2018 12/04/13   Susy Frizzle, MD  colchicine 0.6 MG tablet TAKE 2 TABLETS NOW AND REPEAT 1 TABLET IN 1 HOUR AS NEEDED FOR GOUT FLARE Patient not taking: Reported on 11/08/2018 08/08/18   Susy Frizzle, MD  diclofenac sodium (VOLTAREN) 1 % GEL Apply 2 g topically 4 (four) times daily. Patient not taking: Reported on 11/08/2018 08/05/16   Susy Frizzle, MD  HYDROcodone-acetaminophen (NORCO) 5-325 MG tablet Take 1 tablet by mouth every 6 (six) hours as needed for moderate pain. Patient not taking: Reported on 11/08/2018 08/09/17   Susy Frizzle, MD  metoprolol succinate (TOPROL-XL) 25 MG 24 hr tablet Take 1 tablet by mouth once daily 09/27/18   Susy Frizzle, MD  mometasone (ELOCON) 0.1 % ointment APPLY TOPICALLY DAILY. Patient not taking: Reported on 11/08/2018 04/25/14   Susy Frizzle, MD  tiZANidine (ZANAFLEX) 2 MG tablet Take 1 tablet (2 mg total) by mouth 3 (three) times daily. Patient not taking: Reported on 11/08/2018 09/29/17   Susy Frizzle, MD   Physical Exam: Blood pressure (!) 137/46, pulse (!) 122, temperature 98.8 F (37.1 C), temperature source Oral, resp. rate (!) 27, height _0  (1.6 m), weight 111.6 kg, SpO2 95 %. 1. General:  in  Acute distress complaining of severe pain    Chronically ill   -appearing 2. Psychological: Alert and   Oriented 3. Head/ENT:     Dry Mucous Membranes                          Head Non traumatic, neck supple                           Poor Dentition 4. SKIN:  decreased Skin turgor,  Skin clean Dry firmness redness over left buttock 5. Heart: Regular rate and rhythm no Murmur, no Rub or gallop 6. Lungs:   no wheezes or crackles   7. Abdomen: Soft,  non-tender, Non distended   obese  bowel sounds present 8. Lower extremities: no clubbing, cyanosis, no  edema 9. Neurologically Grossly intact, moving all 4 extremities equally  10. MSK: Normal range of motion   All other LABS:     Recent Labs  Lab 11/08/18 1533  WBC 27.5*  NEUTROABS PENDING  HGB 11.4*  HCT 35.0*  MCV 83.5  PLT 285     Recent Labs  Lab 11/08/18 1533  NA 129*  K 5.1  CL 94*  CO2 16*  GLUCOSE 516*  BUN 125*  CREATININE 4.76*  CALCIUM 9.6     Recent Labs  Lab 11/08/18 1533  AST 31  ALT 32  ALKPHOS 113  BILITOT 0.7  PROT 7.8  ALBUMIN 3.2*       Cultures:    Component Value Date/Time   SDES URINE, RANDOM 11/21/2009 2240   SPECREQUEST NONE 11/21/2009 2240   CULT  11/21/2009 2240    Multiple bacterial morphotypes present, none predominant. Suggest appropriate recollection if clinically indicated.   REPTSTATUS 11/23/2009 FINAL 11/21/2009 2240     Radiological Exams on Admission: Ct Pelvis Wo Contrast  Result Date: 11/08/2018 CLINICAL DATA:  Painful red area to left buttock. EXAM: CT PELVIS WITHOUT CONTRAST TECHNIQUE: Multidetector CT imaging of the pelvis was performed following the standard protocol without intravenous contrast. COMPARISON:  10/03/2014 FINDINGS: Urinary Tract:  Bladder is moderately distended. Bowel: No small bowel or colonic dilatation within the visualized pelvis. Vascular/Lymphatic: Atherosclerotic calcification noted distal aorta and common iliac arteries. Reproductive: The uterus is surgically absent. There is no adnexal mass. Other:  No  intraperitoneal free fluid. Musculoskeletal: No worrisome lytic or sclerotic osseous abnormality. 7.1 x 9.6 x 11.1 cm area of ill-defined, infiltrating edema/inflammation is identified in the medial left buttock region, adjacent to the intergluteal fold. There is gas in the fat, scattered through the area of edema. No focal or rim enhancing fluid collection evident. IMPRESSION: Edema and gas identified in the subcutaneous fat of the medial left gluteal region without associated abscess. Imaging features compatible with soft tissue infection/cellulitis. Electronically Signed   By: Misty Stanley M.D.   On: 11/08/2018 17:21   Dg Chest Port 1 View  Result Date: 11/08/2018 CLINICAL DATA:  78 year old female with buttock pain, hyperglycemia and fever EXAM: PORTABLE CHEST 1 VIEW COMPARISON:  Prior chest x-ray 06/12/2015 FINDINGS: The patient is rotated toward the right in the view is slightly lordotic as well. Stable cardiac and mediastinal contours. Calcified hilar nodes again noted bilaterally. The lungs are clear. Perhaps minimal atelectasis in the right lung base. No acute osseous abnormality. IMPRESSION: Stable chest x-ray without evidence of acute cardiopulmonary process. Electronically Signed   By: Jacqulynn Cadet M.D.   On: 11/08/2018 16:08    Chart has been reviewed    Assessment/Plan   78 y.o. female with medical history significant of DM 2, CKD stage IV, hypothyroidisms atrial fibrillation, CAD, GERD, HLD, HTN, History of seizure disorder  Admitted for  sepsis due to buttock cellulitis with associated hyperglycemia and possible DKA vs lactic acidosis with elevated blood sugar  Present on Admission:  Abscess of left buttock -continue broad-spectrum antibiotics including vancomycin and cefepime and metronidazole to cover anaerobes, general surgery is aware, will rehydrate and treat for underlying medical complications such as sepsis and possible DKA once patient is stable general surgery will  reassess if any surgical intervention will be needed time  Essential hypertension currently stable continue home medications will restart as patient has not been taking them at home  CAD (coronary artery disease) -hold off on aspirin as patient may need surgical intervention restart metoprolol currently chest pain-free Elevated troponin -no chest pain no ischemic changes on EKG in the setting of CKD and worsening renal clearance suspect demand induced continue to cycle cardiac enzymes  GERD (gastroesophageal reflux disease) -chronic stable continue home medications  DKA (diabetic ketoacidoses) (HCC) -versus lactic acidosis we will continue glucose stabilizer, rehydrate, correct electrolytes as needed, check frequent B met and hourly blood glucose transition to home dose of insulin per protocol once anion gap closes  Chronic kidney disease (CKD), stage IV (severe) (HCC) -acute on chronic renal failure in the setting of dehydration will rehydrate and continue to follow  Sepsis [facility]) -   -Patient meets sepsis criteria with  fever    Leukocytosis  Tachycardia   Initial lactic acid Lactic Acid, Venous    Component Value Date/Time   LATICACIDVEN 1.7 11/08/2018 1856   Source most likely: cellulitis,    -We will rehydrate, treat with IV antibiotics started on Vanc, cefepime, flagyl  On 11/08/18  follow lactic acid - Await results of blood and urine culture and adjust antibiotics  as needed - Obtain MRSA serologies    DM 2 - will, transition to home dose once stable, this point continue glucose stabilizer benefit from diabetic coordinator consult hemoglobin A1c      Atrial fibrillation/flutter with RVR vs MAT [facility]) - discussed with cardiology fellow, Order echo, check TSH, cycle CE. Restart home medications, Will need to further evaluate rhythm somewhat unclear at this point. Cardiology will follow in AM ok to hold off on anticoagulation for tonight     Other plan as per orders.  DVT  prophylaxis:  SCD   Code Status:  FULL CODE as per patient   I had personally discussed CODE STATUS with patient    Family Communication:   Family not at  Bedside    Disposition Plan:   To home once workup is complete and patient is stable                                       Consults called: General Surgery , cardiology consulted  Admission status:  ED Disposition    None        inpatient     Expect 2 midnight stay secondary to severity of patient's current illness including   hemodynamic instability despite optimal treatment (tachycardia  )  Severe lab/radiological/exam abnormalities including:  Possible DKA   and extensive comorbidities including DM2   CAD  CKD     That are currently affecting medical management.   I expect  patient to be hospitalized for 2 midnights requiring inpatient medical care.  Patient is at high risk for adverse outcome (such as loss of life or disability) if not treated.  Indication for inpatient stay as follows:    Hemodynamic instability despite maximal medical therapy,    Decreased ability to maintain oral hydration resulting in AKI requiring IV hydration   Need for operative/procedural  intervention    Need for IV antibiotics, IV fluids,   IV pain medications      Level of care      SDU tele indefinitely please discontinue once patient no longer qualifies  Precautions:  NONE   Contact precautions  PPE: Used by the provider:   P100  eye Goggles,  Gloves       Cadyn Rodger 11/08/2018, 7:38 PM    [facility]     after 2 AM please page floor coverage PA If 7AM-7PM, please contact the day team taking care of the patient using [email or web address]

## 2018-11-08 NOTE — ED Notes (Signed)
Patient transported to CT 

## 2018-11-09 ENCOUNTER — Inpatient Hospital Stay (HOSPITAL_COMMUNITY): Payer: PPO | Admitting: Anesthesiology

## 2018-11-09 ENCOUNTER — Encounter (HOSPITAL_COMMUNITY)
Admission: EM | Disposition: A | Discharge status: Skilled Nursing Facility | Payer: Self-pay | Source: Home / Self Care | Attending: Internal Medicine

## 2018-11-09 ENCOUNTER — Other Ambulatory Visit: Payer: Self-pay

## 2018-11-09 ENCOUNTER — Encounter (HOSPITAL_COMMUNITY): Payer: Self-pay

## 2018-11-09 DIAGNOSIS — L0231 Cutaneous abscess of buttock: Secondary | ICD-10-CM

## 2018-11-09 DIAGNOSIS — R7989 Other specified abnormal findings of blood chemistry: Secondary | ICD-10-CM

## 2018-11-09 DIAGNOSIS — I2510 Atherosclerotic heart disease of native coronary artery without angina pectoris: Secondary | ICD-10-CM

## 2018-11-09 DIAGNOSIS — I10 Essential (primary) hypertension: Secondary | ICD-10-CM

## 2018-11-09 DIAGNOSIS — E1110 Type 2 diabetes mellitus with ketoacidosis without coma: Secondary | ICD-10-CM

## 2018-11-09 HISTORY — PX: INCISION AND DRAINAGE ABSCESS: SHX5864

## 2018-11-09 LAB — GLUCOSE, CAPILLARY
Glucose-Capillary: 108 mg/dL — ABNORMAL HIGH (ref 70–99)
Glucose-Capillary: 110 mg/dL — ABNORMAL HIGH (ref 70–99)
Glucose-Capillary: 114 mg/dL — ABNORMAL HIGH (ref 70–99)
Glucose-Capillary: 115 mg/dL — ABNORMAL HIGH (ref 70–99)
Glucose-Capillary: 125 mg/dL — ABNORMAL HIGH (ref 70–99)
Glucose-Capillary: 127 mg/dL — ABNORMAL HIGH (ref 70–99)
Glucose-Capillary: 131 mg/dL — ABNORMAL HIGH (ref 70–99)
Glucose-Capillary: 133 mg/dL — ABNORMAL HIGH (ref 70–99)
Glucose-Capillary: 134 mg/dL — ABNORMAL HIGH (ref 70–99)
Glucose-Capillary: 139 mg/dL — ABNORMAL HIGH (ref 70–99)
Glucose-Capillary: 141 mg/dL — ABNORMAL HIGH (ref 70–99)
Glucose-Capillary: 150 mg/dL — ABNORMAL HIGH (ref 70–99)
Glucose-Capillary: 151 mg/dL — ABNORMAL HIGH (ref 70–99)
Glucose-Capillary: 166 mg/dL — ABNORMAL HIGH (ref 70–99)
Glucose-Capillary: 170 mg/dL — ABNORMAL HIGH (ref 70–99)
Glucose-Capillary: 174 mg/dL — ABNORMAL HIGH (ref 70–99)
Glucose-Capillary: 174 mg/dL — ABNORMAL HIGH (ref 70–99)
Glucose-Capillary: 181 mg/dL — ABNORMAL HIGH (ref 70–99)
Glucose-Capillary: 187 mg/dL — ABNORMAL HIGH (ref 70–99)
Glucose-Capillary: 196 mg/dL — ABNORMAL HIGH (ref 70–99)
Glucose-Capillary: 197 mg/dL — ABNORMAL HIGH (ref 70–99)
Glucose-Capillary: 202 mg/dL — ABNORMAL HIGH (ref 70–99)
Glucose-Capillary: 215 mg/dL — ABNORMAL HIGH (ref 70–99)

## 2018-11-09 LAB — COMPREHENSIVE METABOLIC PANEL
ALT: 34 U/L (ref 0–44)
AST: 37 U/L (ref 15–41)
Albumin: 2.5 g/dL — ABNORMAL LOW (ref 3.5–5.0)
Alkaline Phosphatase: 82 U/L (ref 38–126)
Anion gap: 11 (ref 5–15)
BUN: 105 mg/dL — ABNORMAL HIGH (ref 8–23)
CO2: 16 mmol/L — ABNORMAL LOW (ref 22–32)
Calcium: 8.8 mg/dL — ABNORMAL LOW (ref 8.9–10.3)
Chloride: 109 mmol/L (ref 98–111)
Creatinine, Ser: 3.54 mg/dL — ABNORMAL HIGH (ref 0.44–1.00)
GFR calc Af Amer: 14 mL/min — ABNORMAL LOW (ref >60)
GFR calc non Af Amer: 12 mL/min — ABNORMAL LOW (ref >60)
Glucose, Bld: 118 mg/dL — ABNORMAL HIGH (ref 70–99)
Potassium: 4.7 mmol/L (ref 3.5–5.1)
Sodium: 136 mmol/L (ref 135–145)
Total Bilirubin: 0.4 mg/dL (ref 0.3–1.2)
Total Protein: 6 g/dL — ABNORMAL LOW (ref 6.5–8.1)

## 2018-11-09 LAB — BASIC METABOLIC PANEL
Anion gap: 10 (ref 5–15)
Anion gap: 14 (ref 5–15)
Anion gap: 9 (ref 5–15)
Anion gap: 9 (ref 5–15)
BUN: 104 mg/dL — ABNORMAL HIGH (ref 8–23)
BUN: 105 mg/dL — ABNORMAL HIGH (ref 8–23)
BUN: 111 mg/dL — ABNORMAL HIGH (ref 8–23)
BUN: 123 mg/dL — ABNORMAL HIGH (ref 8–23)
CO2: 15 mmol/L — ABNORMAL LOW (ref 22–32)
CO2: 16 mmol/L — ABNORMAL LOW (ref 22–32)
CO2: 16 mmol/L — ABNORMAL LOW (ref 22–32)
CO2: 17 mmol/L — ABNORMAL LOW (ref 22–32)
Calcium: 8.4 mg/dL — ABNORMAL LOW (ref 8.9–10.3)
Calcium: 8.6 mg/dL — ABNORMAL LOW (ref 8.9–10.3)
Calcium: 8.8 mg/dL — ABNORMAL LOW (ref 8.9–10.3)
Calcium: 8.9 mg/dL (ref 8.9–10.3)
Chloride: 105 mmol/L (ref 98–111)
Chloride: 108 mmol/L (ref 98–111)
Chloride: 108 mmol/L (ref 98–111)
Chloride: 110 mmol/L (ref 98–111)
Creatinine, Ser: 3.22 mg/dL — ABNORMAL HIGH (ref 0.44–1.00)
Creatinine, Ser: 3.43 mg/dL — ABNORMAL HIGH (ref 0.44–1.00)
Creatinine, Ser: 3.48 mg/dL — ABNORMAL HIGH (ref 0.44–1.00)
Creatinine, Ser: 3.9 mg/dL — ABNORMAL HIGH (ref 0.44–1.00)
GFR calc Af Amer: 12 mL/min — ABNORMAL LOW (ref >60)
GFR calc Af Amer: 14 mL/min — ABNORMAL LOW (ref >60)
GFR calc Af Amer: 14 mL/min — ABNORMAL LOW (ref >60)
GFR calc Af Amer: 15 mL/min — ABNORMAL LOW (ref >60)
GFR calc non Af Amer: 11 mL/min — ABNORMAL LOW (ref >60)
GFR calc non Af Amer: 12 mL/min — ABNORMAL LOW (ref >60)
GFR calc non Af Amer: 12 mL/min — ABNORMAL LOW (ref >60)
GFR calc non Af Amer: 13 mL/min — ABNORMAL LOW (ref >60)
Glucose, Bld: 119 mg/dL — ABNORMAL HIGH (ref 70–99)
Glucose, Bld: 146 mg/dL — ABNORMAL HIGH (ref 70–99)
Glucose, Bld: 164 mg/dL — ABNORMAL HIGH (ref 70–99)
Glucose, Bld: 271 mg/dL — ABNORMAL HIGH (ref 70–99)
Potassium: 4.7 mmol/L (ref 3.5–5.1)
Potassium: 4.9 mmol/L (ref 3.5–5.1)
Potassium: 5 mmol/L (ref 3.5–5.1)
Potassium: 5.6 mmol/L — ABNORMAL HIGH (ref 3.5–5.1)
Sodium: 133 mmol/L — ABNORMAL LOW (ref 135–145)
Sodium: 134 mmol/L — ABNORMAL LOW (ref 135–145)
Sodium: 134 mmol/L — ABNORMAL LOW (ref 135–145)
Sodium: 136 mmol/L (ref 135–145)

## 2018-11-09 LAB — TSH: TSH: 1.815 u[IU]/mL (ref 0.350–4.500)

## 2018-11-09 LAB — CBC
HCT: 30 % — ABNORMAL LOW (ref 36.0–46.0)
Hemoglobin: 9.9 g/dL — ABNORMAL LOW (ref 12.0–15.0)
MCH: 27.7 pg (ref 26.0–34.0)
MCHC: 33 g/dL (ref 30.0–36.0)
MCV: 83.8 fL (ref 80.0–100.0)
Platelets: 249 10*3/uL (ref 150–400)
RBC: 3.58 MIL/uL — ABNORMAL LOW (ref 3.87–5.11)
RDW: 14.8 % (ref 11.5–15.5)
WBC: 21.8 10*3/uL — ABNORMAL HIGH (ref 4.0–10.5)
nRBC: 0 % (ref 0.0–0.2)

## 2018-11-09 LAB — TROPONIN I
Troponin I: 0.07 ng/mL (ref <0.03)
Troponin I: 0.08 ng/mL (ref <0.03)
Troponin I: 0.1 ng/mL (ref <0.03)

## 2018-11-09 LAB — LACTIC ACID, PLASMA
Lactic Acid, Venous: 1 mmol/L (ref 0.5–1.9)
Lactic Acid, Venous: 2.9 mmol/L (ref 0.5–1.9)

## 2018-11-09 LAB — PHOSPHORUS: Phosphorus: 2.7 mg/dL (ref 2.5–4.6)

## 2018-11-09 LAB — MAGNESIUM: Magnesium: 2.3 mg/dL (ref 1.7–2.4)

## 2018-11-09 LAB — MRSA PCR SCREENING: MRSA by PCR: NEGATIVE

## 2018-11-09 SURGERY — INCISION AND DRAINAGE, ABSCESS
Anesthesia: General

## 2018-11-09 MED ORDER — SUGAMMADEX SODIUM 500 MG/5ML IV SOLN
INTRAVENOUS | Status: AC
  Filled 2018-11-09: qty 5

## 2018-11-09 MED ORDER — SUCCINYLCHOLINE CHLORIDE 200 MG/10ML IV SOSY
PREFILLED_SYRINGE | INTRAVENOUS | Status: DC | PRN
  Administered 2018-11-09: 140 mg via INTRAVENOUS

## 2018-11-09 MED ORDER — ROCURONIUM BROMIDE 10 MG/ML (PF) SYRINGE
PREFILLED_SYRINGE | INTRAVENOUS | Status: AC
  Filled 2018-11-09: qty 10

## 2018-11-09 MED ORDER — PROPOFOL 10 MG/ML IV BOLUS
INTRAVENOUS | Status: AC
  Filled 2018-11-09: qty 20

## 2018-11-09 MED ORDER — DILTIAZEM HCL 100 MG IV SOLR
5.0000 mg/h | INTRAVENOUS | Status: DC
  Administered 2018-11-09 – 2018-11-10 (×2): 5 mg/h via INTRAVENOUS
  Filled 2018-11-09 (×2): qty 100

## 2018-11-09 MED ORDER — DEXAMETHASONE SODIUM PHOSPHATE 10 MG/ML IJ SOLN
INTRAMUSCULAR | Status: AC
  Filled 2018-11-09: qty 1

## 2018-11-09 MED ORDER — ONDANSETRON HCL 4 MG/2ML IJ SOLN
INTRAMUSCULAR | Status: DC | PRN
  Administered 2018-11-09: 4 mg via INTRAVENOUS

## 2018-11-09 MED ORDER — CHLORHEXIDINE GLUCONATE CLOTH 2 % EX PADS
6.0000 | MEDICATED_PAD | Freq: Every day | CUTANEOUS | Status: DC
  Administered 2018-11-09 – 2018-11-26 (×13): 6 via TOPICAL

## 2018-11-09 MED ORDER — FENTANYL CITRATE (PF) 100 MCG/2ML IJ SOLN
25.0000 ug | INTRAMUSCULAR | Status: DC | PRN
  Administered 2018-11-09 (×2): 50 ug via INTRAVENOUS

## 2018-11-09 MED ORDER — BUPIVACAINE-EPINEPHRINE (PF) 0.5% -1:200000 IJ SOLN
INTRAMUSCULAR | Status: DC | PRN
  Administered 2018-11-09: 40 mL

## 2018-11-09 MED ORDER — 0.9 % SODIUM CHLORIDE (POUR BTL) OPTIME
TOPICAL | Status: DC | PRN
  Administered 2018-11-09: 14:00:00 1000 mL

## 2018-11-09 MED ORDER — PROMETHAZINE HCL 25 MG/ML IJ SOLN: 6.2500 mg | INTRAMUSCULAR | Status: DC | PRN

## 2018-11-09 MED ORDER — FENTANYL CITRATE (PF) 250 MCG/5ML IJ SOLN
INTRAMUSCULAR | Status: DC | PRN
  Administered 2018-11-09: 50 ug via INTRAVENOUS
  Administered 2018-11-09: 100 ug via INTRAVENOUS

## 2018-11-09 MED ORDER — OXYCODONE HCL 5 MG/5ML PO SOLN: 5.0000 mg | Freq: Once | ORAL | Status: DC | PRN

## 2018-11-09 MED ORDER — OXYCODONE HCL 5 MG PO TABS: 5.0000 mg | ORAL_TABLET | Freq: Once | ORAL | Status: DC | PRN

## 2018-11-09 MED ORDER — ROCURONIUM BROMIDE 10 MG/ML (PF) SYRINGE
PREFILLED_SYRINGE | INTRAVENOUS | Status: DC | PRN
  Administered 2018-11-09: 30 mg via INTRAVENOUS

## 2018-11-09 MED ORDER — LACTATED RINGERS IV SOLN
INTRAVENOUS | Status: DC | PRN
  Administered 2018-11-09: 13:00:00 via INTRAVENOUS

## 2018-11-09 MED ORDER — SODIUM CHLORIDE 0.9 % IV SOLN
2.0000 g | INTRAVENOUS | Status: DC
  Administered 2018-11-09 – 2018-11-14 (×6): 2 g via INTRAVENOUS
  Filled 2018-11-09 (×8): qty 2

## 2018-11-09 MED ORDER — FENTANYL CITRATE (PF) 250 MCG/5ML IJ SOLN
INTRAMUSCULAR | Status: AC
  Filled 2018-11-09: qty 5

## 2018-11-09 MED ORDER — FENTANYL CITRATE (PF) 100 MCG/2ML IJ SOLN
INTRAMUSCULAR | Status: AC
  Administered 2018-11-09: 50 ug via INTRAVENOUS
  Filled 2018-11-09: qty 2

## 2018-11-09 MED ORDER — PROPOFOL 10 MG/ML IV BOLUS
INTRAVENOUS | Status: DC | PRN
  Administered 2018-11-09: 100 mg via INTRAVENOUS

## 2018-11-09 MED ORDER — ONDANSETRON HCL 4 MG/2ML IJ SOLN
INTRAMUSCULAR | Status: AC
  Filled 2018-11-09: qty 2

## 2018-11-09 MED ORDER — SUCCINYLCHOLINE CHLORIDE 200 MG/10ML IV SOSY
PREFILLED_SYRINGE | INTRAVENOUS | Status: AC
  Filled 2018-11-09: qty 10

## 2018-11-09 MED ORDER — MORPHINE SULFATE (PF) 2 MG/ML IV SOLN
2.0000 mg | INTRAVENOUS | Status: DC | PRN
  Administered 2018-11-10 – 2018-11-12 (×4): 2 mg via INTRAVENOUS
  Filled 2018-11-09 (×4): qty 1

## 2018-11-09 MED ORDER — LIDOCAINE 2% (20 MG/ML) 5 ML SYRINGE
INTRAMUSCULAR | Status: DC | PRN
  Administered 2018-11-09: 80 mg via INTRAVENOUS

## 2018-11-09 MED ORDER — BUPIVACAINE-EPINEPHRINE 0.5% -1:200000 IJ SOLN
INTRAMUSCULAR | Status: AC
  Filled 2018-11-09: qty 1

## 2018-11-09 MED ORDER — SUGAMMADEX SODIUM 200 MG/2ML IV SOLN
INTRAVENOUS | Status: DC | PRN
  Administered 2018-11-09: 400 mg via INTRAVENOUS

## 2018-11-09 MED ORDER — MEPERIDINE HCL 50 MG/ML IJ SOLN: 6.2500 mg | INTRAMUSCULAR | Status: DC | PRN

## 2018-11-09 MED ORDER — SODIUM CHLORIDE 0.9 % IV BOLUS
500.0000 mL | Freq: Once | INTRAVENOUS | Status: AC
  Administered 2018-11-09: 500 mL via INTRAVENOUS

## 2018-11-09 MED ORDER — LIDOCAINE 2% (20 MG/ML) 5 ML SYRINGE
INTRAMUSCULAR | Status: AC
  Filled 2018-11-09: qty 5

## 2018-11-09 MED ORDER — BUPIVACAINE-EPINEPHRINE (PF) 0.25% -1:200000 IJ SOLN
INTRAMUSCULAR | Status: AC
  Filled 2018-11-09: qty 30

## 2018-11-09 SURGICAL SUPPLY — 27 items
BLADE SURG 15 STRL LF DISP TIS (BLADE) ×1 IMPLANT
BLADE SURG 15 STRL SS (BLADE) ×3
BNDG GAUZE ELAST 4 BULKY (GAUZE/BANDAGES/DRESSINGS) IMPLANT
COVER WAND RF STERILE (DRAPES) IMPLANT
DECANTER SPIKE VIAL GLASS SM (MISCELLANEOUS) IMPLANT
DRAPE LAPAROSCOPIC ABDOMINAL (DRAPES) IMPLANT
DRSG PAD ABDOMINAL 8X10 ST (GAUZE/BANDAGES/DRESSINGS) IMPLANT
ELECT PENCIL ROCKER SW 15FT (MISCELLANEOUS) ×3 IMPLANT
ELECT REM PT RETURN 15FT ADLT (MISCELLANEOUS) ×3 IMPLANT
GAUZE SPONGE 4X4 12PLY STRL (GAUZE/BANDAGES/DRESSINGS) IMPLANT
GLOVE SURG SIGNA 7.5 PF LTX (GLOVE) ×3 IMPLANT
KIT BASIN OR (CUSTOM PROCEDURE TRAY) ×3 IMPLANT
KIT TURNOVER KIT A (KITS) IMPLANT
NEEDLE HYPO 22GX1.5 SAFETY (NEEDLE) IMPLANT
NS IRRIG 1000ML POUR BTL (IV SOLUTION) ×3 IMPLANT
PACK BASIC VI WITH GOWN DISP (CUSTOM PROCEDURE TRAY) ×3 IMPLANT
SPONGE LAP 18X18 RF (DISPOSABLE) ×3 IMPLANT
SUT MNCRL AB 4-0 PS2 18 (SUTURE) IMPLANT
SUT VIC AB 3-0 SH 27 (SUTURE)
SUT VIC AB 3-0 SH 27XBRD (SUTURE) IMPLANT
SWAB COLLECTION DEVICE MRSA (MISCELLANEOUS) IMPLANT
SWAB CULTURE ESWAB REG 1ML (MISCELLANEOUS) IMPLANT
SYR 20CC LL (SYRINGE) IMPLANT
TOWEL OR 17X26 10 PK STRL BLUE (TOWEL DISPOSABLE) ×3 IMPLANT
TOWEL OR NON WOVEN STRL DISP B (DISPOSABLE) ×3 IMPLANT
TRAY FOLEY MTR SLVR 14FR STAT (SET/KITS/TRAYS/PACK) ×2 IMPLANT
YANKAUER SUCT BULB TIP NO VENT (SUCTIONS) ×3 IMPLANT

## 2018-11-09 NOTE — Progress Notes (Signed)
CRITICAL VALUE ALERT  Critical Value: Lactic Acid 2.9, Troponin 0.10  Date & Time Notied:  11/09/18 0025  Provider Notified: Lamar Blinks  Orders Received/Actions taken: Awaiting orders  Mariann Laster RN

## 2018-11-09 NOTE — Anesthesia Preprocedure Evaluation (Addendum)
Anesthesia Evaluation  Patient identified by MRN, date of birth, ID band Patient awake    Reviewed: Allergy & Precautions, NPO status , Patient's Chart, lab work & pertinent test results, reviewed documented beta blocker date and time   Airway Mallampati: II  TM Distance: >3 FB Neck ROM: Full    Dental  (+) Dental Advisory Given   Pulmonary neg pulmonary ROS, former smoker,    Pulmonary exam normal breath sounds clear to auscultation       Cardiovascular hypertension, Pt. on medications and Pt. on home beta blockers + CAD  + dysrhythmias Atrial Fibrillation  Rhythm:Irregular Rate:Normal     Neuro/Psych Seizures -,  negative psych ROS   GI/Hepatic Neg liver ROS, GERD  ,  Endo/Other  negative endocrine ROSdiabetes  Renal/GU Renal disease     Musculoskeletal  (+) Arthritis ,   Abdominal (+) + obese,   Peds  Hematology  (+) Blood dyscrasia, anemia ,   Anesthesia Other Findings   Reproductive/Obstetrics negative OB ROS                            Anesthesia Physical Anesthesia Plan  ASA: IV  Anesthesia Plan: General   Post-op Pain Management:    Induction: Intravenous and Rapid sequence  PONV Risk Score and Plan: 4 or greater and Ondansetron and Treatment may vary due to age or medical condition  Airway Management Planned: Oral ETT  Additional Equipment: None  Intra-op Plan:   Post-operative Plan: Extubation in OR  Informed Consent: I have reviewed the patients History and Physical, chart, labs and discussed the procedure including the risks, benefits and alternatives for the proposed anesthesia with the patient or authorized representative who has indicated his/her understanding and acceptance.     Dental advisory given  Plan Discussed with: CRNA  Anesthesia Plan Comments: (+/- aline)       Anesthesia Quick Evaluation

## 2018-11-09 NOTE — Transfer of Care (Signed)
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  Procedure(s) Performed: INCISION AND DRAINAGE ABSCESS (N/A )  Patient Location: PACU  Anesthesia Type:General  Level of Consciousness: awake, alert , oriented, drowsy and patient cooperative  Airway & Oxygen Therapy: Patient Spontanous Breathing and Patient connected to face mask oxygen  Post-op Assessment: Report given to RN, Post -op Vital signs reviewed and stable and Patient moving all extremities  Post vital signs: Reviewed and stable  Last Vitals:  Vitals Value Taken Time  BP 183/75 11/09/2018  2:30 PM  Temp 36.8 C 11/09/2018  2:30 PM  Pulse 96 11/09/2018  2:37 PM  Resp 28 11/09/2018  2:37 PM  SpO2 98 % 11/09/2018  2:37 PM  Vitals shown include unvalidated device data.  Last Pain:  Vitals:   11/09/18 1430  TempSrc:   PainSc: 0-No pain      Patients Stated Pain Goal: 0 (05/06/10 1735)  Complications: No apparent anesthesia complications

## 2018-11-09 NOTE — Op Note (Signed)
INCISION AND DRAINAGE ABSCESS  Procedure Note  Emily Hughes 11/08/2018 - 11/09/2018   Pre-op Diagnosis: left buttock/perianal abscess     Post-op Diagnosis: same  Procedure(s): INCISION AND DRAINAGE ABSCESS  Surgeon(s): Coralie Keens, MD  Anesthesia: General  Staff:  Circulator: Charlcie Cradle, RN Scrub Person: Charlette Caffey C  Estimated Blood Loss: Minimal               Specimens: cultures taken  Procedure: The patient was brought to the operating room identifies correct patient.  She was placed supine on the operating room table and general anesthesia was induced.  The patient was next placed in the lithotomy position.  Foley catheter was inserted.  The perineum and perianal area were prepped and draped in usual sterile fashion.  An incision with a scalpel on the left buttock/perianal area.  I entered a very large abscess cavity.  There was liquefied, necrotic fat involved in the abscess.  I obtain cultures and then scooped out the necrotic fat with my hand.  There did not appear to be any necrotizing fasciitis.  I irrigated the large cavity with saline.  I anesthetized with Marcaine.  I then packed it with an entire saline soaked Kerlix gauze.  Dry gauze and ABD were placed over this.  The patient tolerated procedure well.  All the counts were correct at the end of the procedure.  The patient was then extubated in the operating room and taken in stable addition to the recovery.          Coralie Keens   Date: 11/09/2018  Time: 2:12 PM

## 2018-11-09 NOTE — Progress Notes (Signed)
CC: Gluteal pain, fever and chills  Subjective: Pt HR up some, in AF, I don't see a prior hx of that.  BP OK, Cardiology has talked with her over the phone.  I don't see a note clearing her for surgery.  She says she has had palpations before walking.  No complaints of chest pain or SOB.  Staff is wiping her down now for surgery.  Objective: Vital signs in last 24 hours: Temp:  [98.7 F (37.1 C)-100.4 F (38 C)] 100.4 F (38 C) (05/07 0348) Pulse Rate:  [40-152] 80 (05/07 0600) Resp:  [10-29] 27 (05/07 0600) BP: (114-161)/(39-127) 145/53 (05/07 0600) SpO2:  [89 %-99 %] 89 % (05/07 0600) Weight:  [111.6 kg] 111.6 kg (05/06 1450)  5110 IV 600 urine Temperature 100.4 at 0 300 this morning Vital signs are stable and the tachycardia is improving Admission creatinine 4.22>> 3.43 this a.m. Admission glucose 516>> 458>> CBGs 0 800 - 170 Troponin  0.09 >> 0.10 >> 0.08 Admission WBC 27.5 >> 21.8 this AM INR 1.3 Lactate 2.0 >> 2.9>>1.0 Intake/Output from previous day: 05/06 0701 - 05/07 0700 In: 5110.5 [I.V.:390.7; IV Piggyback:4719.8] Out: 600 [Urine:600] Intake/Output this shift: No intake/output data recorded.  General appearance: alert, cooperative and mild distress Resp: clear to auscultation bilaterally Cardio: AF with controlled HR, BP is stable Skin: Being prepped for surgery, I did not turn her over to examine  Lab Results:  Recent Labs    11/08/18 1533 11/09/18 0344  WBC 27.5* 21.8*  HGB 11.4* 9.9*  HCT 35.0* 30.0*  PLT 285 249    BMET Recent Labs    11/08/18 2330 11/09/18 0344  NA 134* 136  136  K 4.9 4.7  4.7  CL 105 110  109  CO2 15* 17*  16*  GLUCOSE 271* 119*  118*  BUN 123* 105*  105*  CREATININE 3.90* 3.48*  3.54*  CALCIUM 8.8* 8.9  8.8*   PT/INR Recent Labs    11/08/18 2037  LABPROT 16.5*  INR 1.3*    Recent Labs  Lab 11/08/18 1533 11/09/18 0344  AST 31 37  ALT 32 34  ALKPHOS 113 82  BILITOT 0.7 0.4  PROT 7.8 6.0*   ALBUMIN 3.2* 2.5*     Lipase     Component Value Date/Time   LIPASE 21 06/24/2014 0816     Medications: . allopurinol  300 mg Oral Daily  . calcitRIOL  0.25 mcg Oral Daily  . Chlorhexidine Gluconate Cloth  6 each Topical Daily  . cloNIDine  0.1 mg Oral BID  . diltiazem  300 mg Oral Daily  . insulin regular  0-10 Units Intravenous TID WC  . levothyroxine  50 mcg Oral Q0600  . mouth rinse  15 mL Mouth Rinse BID  . metoprolol succinate  25 mg Oral Daily  . pravastatin  80 mg Oral QHS  . sodium bicarbonate  650 mg Oral QID   . sodium chloride Stopped (11/09/18 0022)  . ceFEPime (MAXIPIME) IV    . dextrose 5 % and 0.45% NaCl 75 mL/hr at 11/09/18 0424  . insulin 1.1 Units/hr (11/09/18 0819)  . metronidazole Stopped (11/09/18 0527)  . [START ON 11/10/2018] vancomycin      Assessment/Plan DKA Type 2 diabetes Chronic on acute stage IIV kidney disease Hypertension Elevated troponin/Atrial fibrillation Hx of palpitations  GERD Hx of seizures 06/2015 Morbid obesity BMI 43.5   Left gluteal fold abscess  FEN: N.p.o./IV fluids ID: Rocephin 5/6 x 1 dose;  Flagyl 5/7>> day 1; vancomycin x1 dose 11/08/2018 DVT: SCDs Follow-up: To be determined POC: Sister Emily Hughes -[phone]    Plan:  I&D of left gluteal abscess later this AM.     LOS: 1 day    Emily Hughes 11/09/2018 [phone]

## 2018-11-09 NOTE — Consult Note (Signed)
Cardiology Consultation:   Patient ID: Leo Weyandt; [id number]; 08/11/1940   Admit date: 11/08/2018 Date of Consult: 11/09/2018  Primary Care Provider: Susy Frizzle, MD Primary Cardiologist: New- Dr. Percival Spanish remotely (2011)  Patient Profile:   Xenia Nile is a 78 y.o. female with a hx of CAD (cath from 1980's with 50% diag lesion and negative stress test in 2006), RHD at age 60, HLD, HTN, DM 2, CKD stage IV and history of seizure disorder (followed by neurology) who is being seen today for the evaluation of elevated troponin at the request of Dr. Roel Cluck.  History of Present Illness:   Ms. Herber is a 78 year old female who initially presented to [facility] on 11/08/2018 with a 3-day history of gluteal pain with associated fevers (max-102) and chills found to have an indurated area of the left medial gluteal fold on ED presentation.  She underwent a CT scan of the pelvis which showed an abscess in the subcutaneous tissue of this area.  Additionally, she was found to be in diabetic ketoacidosis with a glucose on presentation greater than 500 and she was started on an insulin gtt/glucostabilizer.  Internal medicine admitted and general surgery was consulted for a possible I&D of the gluteal abscess once her DKA and electrolyte have been stabilized. She was started broad-spectrum antibiotics. COVID testing was negative. Her EKG at the time showed atrial flutter versus ST with PACs with HR 130, incomplete bundle and LVH. Repeat EKG from this AM shows NSR with HR 96, LVH and no acute ischemic changes. Creatinine on admission was markedly elevated at 4.22 with mild improvement today to 3.48. Troponin levels were found to be elevated at 0.09, 0.10, 0.08-flat trend with no anginal complaints.  Her lactic acid was found to peak at 2.9 with a procalcitonin of 6.89. WBC was elevated at 21.8. TSH was within normal limits.  In speaking with the patient today, she denies any recent anginal symptoms.  She  lives alone and is able to perform tasks such as cleaning, cooking and housework without complication.  She denies recent shortness of breath, PND, LE swelling, orthopnea, abnormal weight gain, dizziness or syncopal episodes.  She is followed closely by her PCP who manages her hypertension and other comorbid conditions.  Ms. Ferrer has been remotely followed by cardiology service dating back to 2009 and when she was last seen by Dr. Percival Spanish 02/27/2010 for 2-year follow-up.  In 2009 she was hospitalized with complaints of chest pain in which a cardiac work-up was essentially negative. EKG at that time had nonspecific T wave abnormalities however was unchanged from previous tracings. She underwent a V/Q scan which showed low probability of PE. Echocardiogram performed 01/09/2008 revealed an LVEF of 55%.  She had previously undergone a stress perfusion study in 2006 which showed no evidence of ischemia or scar.  Plan was to perform a follow-up Myoview stress test however I do not see these results in epic.  Her last office visit on file was with Dr. Percival Spanish on 02/27/2010 in which she had no new anginal symptoms since 2009. She was noted to have multiple cardiovascular risk factors and plan was to continue with aggressive secondary prevention.    Cardiology has been asked to evaluate given her remote history of CAD and elevated troponin.  Past Medical History:  Diagnosis Date   Anemia    CAD (coronary artery disease)    Colon polyps    CRI (chronic renal insufficiency)    DDD (degenerative disc disease)  Diabetes mellitus    INSULIN DEPENDENT   GERD (gastroesophageal reflux disease)    High cholesterol    Hypertension    Pancreatitis    Proteinuria     Past Surgical History:  Procedure Laterality Date   ABDOMINAL HYSTERECTOMY     ANGIOPLASTY     ANKLE SURGERY Right    BACK SURGERY     TONSILLECTOMY       Prior to Admission medications   Medication Sig Start Date End Date  Taking? Authorizing Provider  allopurinol (ZYLOPRIM) 100 MG tablet Take 1 tablet (100 mg total) by mouth daily. Take this with the 300 mg to make 400 mg total 09/22/18  Yes Susy Frizzle, MD  allopurinol (ZYLOPRIM) 300 MG tablet Take 1 tablet (300 mg total) by mouth daily. 09/22/18  Yes Susy Frizzle, MD  aspirin 81 MG tablet Take 81 mg by mouth daily.     Yes [provider]  calcitRIOL (ROCALTROL) 0.25 MCG capsule Take 0.25 mcg by mouth daily.   Yes [provider]  cloNIDine (CATAPRES) 0.1 MG tablet Take 1 tablet (0.1 mg total) by mouth 2 (two) times daily. 07/19/14  Yes Susy Frizzle, MD  colchicine 0.6 MG tablet Take 0.5 tablets (0.3 mg total) by mouth daily. 10/10/18  Yes Susy Frizzle, MD  diltiazem (CARDIZEM CD) 300 MG 24 hr capsule Take 1 capsule (300 mg total) by mouth daily. 10/02/18  Yes Susy Frizzle, MD  furosemide (LASIX) 40 MG tablet TAKE 2 TABLETS IN THE MORNING AND 1 TABLET IN THE EVENING. 08/12/15  Yes Pickard, Cammie Mcgee, MD  glimepiride (AMARYL) 4 MG tablet TAKE 1 TABLET ONCE DAILY. 09/25/18  Yes Susy Frizzle, MD  insulin aspart (NOVOLOG) 100 UNIT/ML FlexPen Inject 10 Units into the skin daily with lunch. Patient taking differently: Inject 10 Units into the skin daily. Sliding Scale 07/24/14  Yes Susy Frizzle, MD  Insulin Glargine (LANTUS SOLOSTAR) 100 UNIT/ML Solostar Pen INJECT 0.30ML(30 UNITS) INTO THE SKIN DAILY. 09/25/18  Yes Susy Frizzle, MD  levothyroxine (SYNTHROID, LEVOTHROID) 50 MCG tablet Take 1 tablet (50 mcg total) by mouth daily. 10/02/18  Yes Susy Frizzle, MD  pravastatin (PRAVACHOL) 80 MG tablet Take 1 tablet by mouth at bedtime 09/27/18  Yes Susy Frizzle, MD  sodium bicarbonate 650 MG tablet Take 1 tablet (650 mg total) by mouth 4 (four) times daily. 07/19/14  Yes Susy Frizzle, MD  sucralfate (CARAFATE) 1 G tablet Take 1 tablet (1 g total) by mouth 4 (four) times daily -  with meals and at bedtime. 07/11/14  Yes  Susy Frizzle, MD  VELTASSA 8.4 g packet Take 8.4 g by mouth daily.  12/20/17  Yes [provider]  B-D ULTRAFINE III SHORT PEN 31G X 8 MM MISC USE WITH LANTUS. 07/31/18   Susy Frizzle, MD  cetirizine (ZYRTEC) 10 MG tablet Take 1 tablet (10 mg total) by mouth daily. Patient not taking: Reported on 11/08/2018 12/04/13   Susy Frizzle, MD  colchicine 0.6 MG tablet TAKE 2 TABLETS NOW AND REPEAT 1 TABLET IN 1 HOUR AS NEEDED FOR GOUT FLARE Patient not taking: Reported on 11/08/2018 08/08/18   Susy Frizzle, MD  diclofenac sodium (VOLTAREN) 1 % GEL Apply 2 g topically 4 (four) times daily. Patient not taking: Reported on 11/08/2018 08/05/16   Susy Frizzle, MD  HYDROcodone-acetaminophen [facility]) 5-325 MG tablet Take 1 tablet by mouth every 6 (six) hours as needed  for moderate pain. Patient not taking: Reported on 11/08/2018 08/09/17   Susy Frizzle, MD  metoprolol succinate (TOPROL-XL) 25 MG 24 hr tablet Take 1 tablet by mouth once daily 09/27/18   Susy Frizzle, MD  mometasone (ELOCON) 0.1 % ointment APPLY TOPICALLY DAILY. Patient not taking: Reported on 11/08/2018 04/25/14   Susy Frizzle, MD  tiZANidine (ZANAFLEX) 2 MG tablet Take 1 tablet (2 mg total) by mouth 3 (three) times daily. Patient not taking: Reported on 11/08/2018 09/29/17   Susy Frizzle, MD    Inpatient Medications: Scheduled Meds:  allopurinol  300 mg Oral Daily   calcitRIOL  0.25 mcg Oral Daily   Chlorhexidine Gluconate Cloth  6 each Topical Daily   cloNIDine  0.1 mg Oral BID   diltiazem  300 mg Oral Daily   insulin regular  0-10 Units Intravenous TID WC   levothyroxine  50 mcg Oral Q0600   mouth rinse  15 mL Mouth Rinse BID   metoprolol succinate  25 mg Oral Daily   pravastatin  80 mg Oral QHS   sodium bicarbonate  650 mg Oral QID   Continuous Infusions:  sodium chloride Stopped (11/09/18 0022)   ceFEPime (MAXIPIME) IV     dextrose 5 % and 0.45% NaCl 75 mL/hr at 11/09/18 0424    insulin 6.9 Units/hr (11/09/18 0018)   metronidazole Stopped (11/09/18 0527)   [START ON 11/10/2018] vancomycin     PRN Meds: acetaminophen **OR** acetaminophen, dextrose, HYDROcodone-acetaminophen, morphine injection, ondansetron **OR** ondansetron (ZOFRAN) IV  Allergies:    Allergies  Allergen Reactions   Ace Inhibitors    Actos [Pioglitazone Hydrochloride]    Dust Mite Extract    Dye Fdc Red [Erythrosine Red No. 3]     Social History:   Social History   Socioeconomic History   Marital status: Widowed    Spouse name: Not on file   Number of children: Not on file   Years of education: Not on file   Highest education level: Not on file  Occupational History   Occupation: Retired  Scientist, product/process development strain: Not on file   Food insecurity:    Worry: Not on file    Inability: Not on file   Transportation needs:    Medical: Not on file    Non-medical: Not on file  Tobacco Use   Smoking status: Former Smoker   Smokeless tobacco: Never Used   Tobacco comment: quit smoking in 2000  Substance and Sexual Activity   Alcohol use: No    Alcohol/week: 0.0 standard drinks   Drug use: No   Sexual activity: Not on file  Lifestyle   Physical activity:    Days per week: Not on file    Minutes per session: Not on file   Stress: Not on file  Relationships   Social connections:    Talks on phone: Not on file    Gets together: Not on file    Attends religious service: Not on file    Active member of club or organization: Not on file    Attends meetings of clubs or organizations: Not on file    Relationship status: Not on file   Intimate partner violence:    Fear of current or ex partner: Not on file    Emotionally abused: Not on file    Physically abused: Not on file    Forced sexual activity: Not on file  Other Topics Concern   Not on file  Social History Narrative   Not on file    Family History:   Family History  Problem Relation  Age of Onset   Diabetes Mother    Diabetes Father    Diabetes Sister    Family Status:  Family Status  Relation Name Status   Mother  (Not Specified)   Father  (Not Specified)   Sister  (Not Specified)    ROS:  Please see the history of present illness.  All other ROS reviewed and negative.     Physical Exam/Data:   Vitals:   11/09/18 0432 11/09/18 0433 11/09/18 0500 11/09/18 0600  BP:   (!) 146/49 (!) 145/53  Pulse: (!) 45 98 99 80  Resp: 14 10 (!) 29 (!) 27  Temp:      TempSrc:      SpO2: 95% 95% 92% (!) 89%  Weight:      Height:        Intake/Output Summary (Last 24 hours) at 11/09/2018 0738 Last data filed at 11/09/2018 0600 Gross per 24 hour  Intake 5110.45 ml  Output 600 ml  Net 4510.45 ml   Filed Weights   11/08/18 1450  Weight: 111.6 kg   Body mass index is 43.58 kg/m.   General:  Well nourished, well developed, in no acute distress HEENT: normal Lymph: no adenopathy Neck: no JVD Endocrine:  No thryomegaly Vascular: No carotid bruits; 4/4 extremity pulses 2+, without bruits  Cardiac:  normal S1, S2; iRRR; no murmur  Lungs:  clear to auscultation bilaterally, no wheezing, rhonchi or rales  Abd: soft, nontender, no hepatomegaly  Ext: no edema Musculoskeletal:  No deformities, BUE and BLE strength normal and equal Skin: warm and dry  Neuro:  CNs 2-12 intact, no focal abnormalities noted Psych:  Normal affect   EKG:  The EKG was personally reviewed and demonstrates: 11/09/2018 NSR with no acute ischemic changes, evidence of LVH Telemetry:  Telemetry was personally reviewed and demonstrates:  Atrial flutter with variable block  Relevant CV Studies:  ECHO: None   CATH: Remote- 1980's with no hard copy on file   Laboratory Data:  Chemistry Recent Labs  Lab 11/08/18 2037 11/08/18 2330 11/09/18 0344  NA 131* 134* 136   136  K 4.9 4.9 4.7   4.7  CL 101 105 110   109  CO2 16* 15* 17*   16*  GLUCOSE 458* 271* 119*   118*  BUN 121* 123*  105*   105*  CREATININE 4.22* 3.90* 3.48*   3.54*  CALCIUM 8.4* 8.8* 8.9   8.8*  GFRNONAA 10* 11* 12*   12*  GFRAA 11* 12* 14*   14*  ANIONGAP 14 14 9   11     Total Protein  Date Value Ref Range Status  11/09/2018 6.0 (L) 6.5 - 8.1 g/dL Final   Albumin  Date Value Ref Range Status  11/09/2018 2.5 (L) 3.5 - 5.0 g/dL Final   AST  Date Value Ref Range Status  11/09/2018 37 15 - 41 U/L Final   ALT  Date Value Ref Range Status  11/09/2018 34 0 - 44 U/L Final   Alkaline Phosphatase  Date Value Ref Range Status  11/09/2018 82 38 - 126 U/L Final   Total Bilirubin  Date Value Ref Range Status  11/09/2018 0.4 0.3 - 1.2 mg/dL Final   Hematology Recent Labs  Lab 11/08/18 1533 11/09/18 0344  WBC 27.5* 21.8*  RBC 4.19 3.58*  HGB 11.4* 9.9*  HCT 35.0* 30.0*  MCV 83.5 83.8  MCH 27.2 27.7  MCHC 32.6 33.0  RDW 14.9 14.8  PLT 285 249   Cardiac Enzymes Recent Labs  Lab 11/08/18 1856 11/08/18 2330 11/09/18 0554  TROPONINI 0.09* 0.10* 0.08*   No results for input(s): TROPIPOC in the last 168 hours.  BNPNo results for input(s): BNP, PROBNP in the last 168 hours.  DDimer No results for input(s): DDIMER in the last 168 hours. TSH:  Lab Results  Component Value Date   TSH 1.815 11/09/2018   Lipids: Lab Results  Component Value Date   CHOL 120 07/12/2017   HDL 35 (L) 07/12/2017   LDLCALC 64 07/12/2017   TRIG 119 07/12/2017   CHOLHDL 3.4 07/12/2017   HgbA1c: Lab Results  Component Value Date   HGBA1C 7.2 (H) 07/12/2017    Radiology/Studies:  Ct Pelvis Wo Contrast  Result Date: 11/08/2018 CLINICAL DATA:  Painful red area to left buttock. EXAM: CT PELVIS WITHOUT CONTRAST TECHNIQUE: Multidetector CT imaging of the pelvis was performed following the standard protocol without intravenous contrast. COMPARISON:  10/03/2014 FINDINGS: Urinary Tract:  Bladder is moderately distended. Bowel: No small bowel or colonic dilatation within the visualized pelvis. Vascular/Lymphatic:  Atherosclerotic calcification noted distal aorta and common iliac arteries. Reproductive: The uterus is surgically absent. There is no adnexal mass. Other:  No intraperitoneal free fluid. Musculoskeletal: No worrisome lytic or sclerotic osseous abnormality. 7.1 x 9.6 x 11.1 cm area of ill-defined, infiltrating edema/inflammation is identified in the medial left buttock region, adjacent to the intergluteal fold. There is gas in the fat, scattered through the area of edema. No focal or rim enhancing fluid collection evident. IMPRESSION: Edema and gas identified in the subcutaneous fat of the medial left gluteal region without associated abscess. Imaging features compatible with soft tissue infection/cellulitis. Electronically Signed   By: Misty Stanley M.D.   On: 11/08/2018 17:21   Dg Chest Port 1 View  Result Date: 11/08/2018 CLINICAL DATA:  78 year old female with buttock pain, hyperglycemia and fever EXAM: PORTABLE CHEST 1 VIEW COMPARISON:  Prior chest x-ray 06/12/2015 FINDINGS: The patient is rotated toward the right in the view is slightly lordotic as well. Stable cardiac and mediastinal contours. Calcified hilar nodes again noted bilaterally. The lungs are clear. Perhaps minimal atelectasis in the right lung base. No acute osseous abnormality. IMPRESSION: Stable chest x-ray without evidence of acute cardiopulmonary process. Electronically Signed   By: Jacqulynn Cadet M.D.   On: 11/08/2018 16:08    Assessment and Plan:   1.  Elevated troponin: -Pt presented to [facility] on 11/08/2018 with a 3-day history of gluteal pain with associated fevers (max-102) and chills found to have an indurated area of the left medial gluteal fold on ED presentation.  She underwent a CT scan of the pelvis which showed an abscess in the subcutaneous tissue of this area.  -Troponin levels were drawn and found to be mildly elevated at 0.09, 0.10 and 0.08 which are flat and not consistent with ACS and more consistent with demand  ischemia in the settings of sepsis, elevated lactic acid, and acute on chronic kidney failure. No recent complaints of anginal symptoms.  -EKG initially with questionable a flutter versus sinus tach with PACs and repeat EKG from today with NSR and evidence of LVH, no evidence for ischemia -On home ASA 81 which is currently on hold secondary to the need for surgical intervention -Home Toprol 25, pravastatin resumed - no ischemic workup is needed  2. Atrial flutter with variable block -  new, in the settings of sepsis - start cardizem drip, d/c PO cardizem  - in and out of flutter today -no need for anticoagulation at this time given short duration and need for surgical intervention -Continue to monitor closely on telemetry for recurrence  3.  Left gluteal abscess with sepsis: -Patient presented with a 3-day history of left gluteal pain with fevers and chills found to have a gluteal abscess per CT scan with plans for surgical intervention per general surgery team once DKA has stabilized -Placed on broad-spectrum antibiotic therapy -Further management per primary team, general surgery  4.  DKA: -Glucose on presentation greater than 500 with initiation of insulin gtt. in the emergency department -More stable now -Patient reports not taking her SQ insulin for several days prior to admission -Also on glimepiride PO  -Once transition off glucose stabilizer, SSI for glucose control -May need diabetic counseling prior to discharge -Further management per primary team  5.  CKD stage IV: -Creatinine on presentation, 4.22 with mild improvement today at 3.48 -Patient reports following with nephrology in the outpatient setting -They have discussed the possible need for dialysis in the future however patient declines -Nephrology consult  5.  Hypertension: -Elevated, 145/53, 146/49, 142/39 -Likely in the setting of increased pain -On home Toprol 25, diltiazem 300, clonidine 0.1 mg twice  daily -May need further medication titration however will hold off in the setting of acute illness and follow in the postsurgical setting  For questions or updates, please contact [facility] Please consult [email or web address] for contact info under Cardiology/STEMI.   Signed, Ena Dawley, MD 11/09/2018 7:38 AM   allopurinol  300 mg Oral Daily   calcitRIOL  0.25 mcg Oral Daily   Chlorhexidine Gluconate Cloth  6 each Topical Daily   cloNIDine  0.1 mg Oral BID   diltiazem  300 mg Oral Daily   insulin regular  0-10 Units Intravenous TID WC   levothyroxine  50 mcg Oral Q0600   mouth rinse  15 mL Mouth Rinse BID   metoprolol succinate  25 mg Oral Daily   pravastatin  80 mg Oral QHS   sodium bicarbonate  650 mg Oral QID

## 2018-11-09 NOTE — Progress Notes (Signed)
Inpatient Diabetes Program Recommendations  AACE/ADA: New Consensus Statement on Inpatient Glycemic Control (2015)  Target Ranges:  Prepandial:   less than 140 mg/dL      Peak postprandial:   less than 180 mg/dL (1-2 hours)      Critically ill patients:  140 - 180 mg/dL   Lab Results  Component Value Date   GLUCAP 202 (H) 11/09/2018   HGBA1C 7.2 (H) 07/12/2017    Review of Glycemic Control  Diabetes history: DM2 Outpatient Diabetes medications: Lantus 30 units QD, Novolog 10 units QD, Amaryl 4 mg QD (Has not been taking her insulin for past 2 days) Current orders for Inpatient glycemic control: IV insulin  On CL diet. CO2 - 16  AG - 10 Has not been checking blood sugars regularly. Needs tight glucose control for healing PCP - Jenna Luo, MD, [facility]  Inpatient Diabetes Program Recommendations:     When criteria met for d/cing drip, give Lantus 1-2 hours prior to discontinuation of insulin drip. Lantus 15 units bid Novolog 0-15 units Q4H Novolog 6 units tidwc for meal coverage insulin.  Continue to follow.  Thank you. Lorenda Peck, RD, LDN, CDE Inpatient Diabetes Coordinator [phone]

## 2018-11-09 NOTE — Anesthesia Procedure Notes (Signed)
Procedure Name: Intubation Date/Time: 11/09/2018 1:44 PM Performed by: Mitzie Na, CRNA Pre-anesthesia Checklist: Patient identified, Emergency Drugs available, Suction available, Patient being monitored and Timeout performed Patient Re-evaluated:Patient Re-evaluated prior to induction Oxygen Delivery Method: Circle system utilized Preoxygenation: Pre-oxygenation with 100% oxygen Induction Type: IV induction and Rapid sequence Grade View: Grade I Tube type: Oral Tube size: 7.5 mm Number of attempts: 1 Airway Equipment and Method: Stylet Placement Confirmation: ETT inserted through vocal cords under direct vision,  positive ETCO2 and breath sounds checked- equal and bilateral Secured at: 24 cm Tube secured with: Tape Dental Injury: Teeth and Oropharynx as per pre-operative assessment

## 2018-11-09 NOTE — Progress Notes (Signed)
PROGRESS NOTE    Emily Hughes  TJQ:[id number] DOB: [birth year] DOA: 11/08/2018 PCP: Susy Frizzle, MD   Brief Narrative:  HPI per Dr. Toy Baker on 11/08/2018  Patient is a 78 year old African-American female with a past medical history significant for but not limited to diabetes mellitus type 2, CKD stage IV, hypothyroidism, CAD, GERD, hypertension, hyperlipidemia, history of seizure disorder, ? Atrial fibrillation, long with comorbidities who presented to the emergency room with a 3-day history of pain and swelling in her buttocks along with a fever of 102.  Family reportedly gave her some Tylenol that helped her improve her temperature down to 99 but she is found to have elevated blood pressures for last few days when in the 400s.  She has not been taking her insulin for last 2 days and has not been checking her glucose regularly.  On admission she stated that the left buttock pain lasted for last few days and was severe 10 out of 10 and reportedly was a boil that then progressed.  She was worked up and admitted for sepsis secondary to abscess of the left buttock as well as DKA.  She was placed in the stepdown unit and was placed on glucose stabilizer and has improvement in her blood sugars.  She was incidentally noted to be in atrial fibrillation.  Cardiology was consulted for preoperative clearance and patient was taken for incision and drainage of her abscess on her left buttock currently.  Assessment & Plan:   Active Problems:   Essential hypertension   CAD (coronary artery disease)   GERD (gastroesophageal reflux disease)   DKA (diabetic ketoacidoses) (HCC)   Chronic kidney disease (CKD), stage IV (severe) (HCC)   Abscess of left buttock   Sepsis (HCC)   Atrial fibrillation with RVR (HCC)   Elevated troponin  Sepsis secondary to the abscess of the left buttock -Patient met sepsis criteria with a fever, leukocytosis, and tachycardia (Atrial Fibrillation).  Leukocytosis now  trending down -This protocol initiated in the ED and patient was given 30 mL's per KG doses -Sepsis physiology is improving -Lactic acid level is now improved -CT Pelvis scan showed "Edema and gas identified in the subcutaneous fat of the medial left gluteal region without associated abscess. Imaging features compatible with soft tissue infection/cellulitis/" -Patient was placed on broad-spectrum antibiotics including vancomycin and IV cefepime as well as metronidazole to cover anaerobes.  We will consider de-escalating antibiotics to just IV vancomycin along with ceftriaxone and clindamycin based on results of the culture sent from surgery -Procalcitonin level was 6.89 and lactic acid trended down from 2.9 is now 1.0 -SARS-CoV-2 Negative  -General surgery was consulted for further evaluation and patient was rehydrated and treated for her medical complications -Since blood sugars are better controlled today patient will be going for surgery and cardiology is evaluated for preoperative clearance next -Continue monitor temperature curve and cultures; Blood Cx x2 showed NGTD at <12 Hours -MRSA negative -Unclear why patient was placed on contact precautions however there is no evidence that the patient needs to be a contact precautions and will be discontinued -Repeat CBC  In AM   DKA in the setting of diabetes mellitus type 2 -Patient was placed on glucose stabilizer and rehydrated and other collections are being corrected -We will transition to home dose of insulin per protocol once anion gap closes and CO2 is greater than 202 -CO2 this morning was 16 and anion gap improved to 10 from 19 -Continue glucose stabilizer for now as  well as normal saline at rate 100 mL's per hour and will change to D5 half-normal saline with CBGs are less than 250 at a rate of 75 mL's per hour -CBGs are improving and currently ranging from 134-202 now so will continue on D5 1/2 NS  -On admission patient had a  Beta-Hydroxybutyric acid of 2.29 -Continue antibiotics as above -Patient likely in DKA secondary to infection from left buttock abscess -We will repeat blood work when patient comes back from the OR -We will need diabetes education coordinator to follow -We will need hemoglobin A1c  Essential Hypertension -Resume home medications when able to;  -C/w Home Metoprolol XL 25 mg po Daily  -She is started on a Cardizem drip and p.o. diltiazem has now been discontinued -We will continue clonidine 0.1 mg p.o. twice daily -Need to monitor closely and adjust medication as accordingly  CAD -Currently holding off on aspirin given surgical intervention needed -We will continue metoprolol -Patient is currently chest pain-free -Cardiology is evaluating and feels no ischemic work-up is needed currently in recommend resuming home Toprol 25 mg p.o. daily along with pravastatin and aspirin after her procedures done  Elevated Troponin -Denies any chest pain and there is no ischemic changes on EKG -Likely in the setting of CKD and worsening renal clearance -Likely demand ischemia in the setting of sepsis -Continue to cycle cardiac enzymes have been trending down and went from 0.09 -> 0.10 -> 0.08 -> 0.07 -Echocardiogram has been ordered and pending to be done  AKI on CKD stage IV -Patient presented with a BUN/creatinine of 125/4.76 is now 111/3.43 -Continue with IV fluid hydration with normal saline at 100 mL's per hour and changed to D5 half-normal saline at 75 mL/hr when blood sugars are less than 250 per protocol -with nephrotoxic medications, contrast dyes, as well as hypertension if possible -Continue with calcitriol -Continue with sodium bicarb 650 mg p.o. 4 times daily -Continue monitor and trend renal function -There is been discussions for possible need for dialysis in the future however patient is continually declined -Repeat CMP in a.m.  Atrial fibrillation/flutter with RVR versus MAT;  audiology feels this is atrial flutter with variable block -Cardiology was consulted feels this is new in the setting of sepsis -Echo has been ordered along with TSH and cardiac enzymes are being cycled -patient is going be started on a Cardizem drip and p.o. Cardizem will be discontinued (Takes 300 mg po Daily at home) -Patient will be restarted on metoprolol XL 25 mg p.o. daily -Cardiology feels there is no need for anticoagulation at this time given her short duration need for surgical intervention -We will continue monitor closely on telemetry for recurrence  Hypothyroidism -Checked TSH and was 1.815 -Continue levothyroxine 50 mcg p.o. daily  Morbid Obesity -Estimated body mass index is 43.58 kg/m as calculated from the following:   Height as of this encounter: _0  (1.6 m).   Weight as of this encounter: 111.6 kg. -Weight Loss and Dietary Counseling given   History of gout -Currently colchicine is being held but will resume allopurinol 300 mg p.o. daily  DVT prophylaxis: SCDs Code Status: FULL CODE Family Communication: No family present at bedside  Disposition Plan: Pending Improvement of DKA, Abscess, and A Flutter  Consultants:   General Surgery  Cardiology   Procedures:  I and D of Left Buttock and Perinanal Abscess done by Dr. Ninfa Linden on 11/09/2018  Antimicrobials:  Anti-infectives (From admission, onward)   Start     Dose/Rate  Route Frequency Ordered Stop   11/10/18 2200  [MAR Hold]  vancomycin (VANCOCIN) IVPB 750 mg/150 ml premix     (MAR Hold since Thu 11/09/2018 at 1221. Reason: Transfer to a Procedural area.)   750 mg 150 mL/hr over 60 Minutes Intravenous Every 48 hours 11/08/18 2126     11/09/18 1000  [MAR Hold]  ceFEPIme (MAXIPIME) 2 g in sodium chloride 0.9 % 100 mL IVPB     (MAR Hold since Thu 11/09/2018 at 1221. Reason: Transfer to a Procedural area.)   2 g 200 mL/hr over 30 Minutes Intravenous Every 24 hours 11/09/18 0901     11/08/18 2200  ceFEPIme  (MAXIPIME) 2 g in sodium chloride 0.9 % 100 mL IVPB  Status:  Discontinued     2 g 200 mL/hr over 30 Minutes Intravenous Every 24 hours 11/08/18 2126 11/09/18 0901   11/08/18 2030  [MAR Hold]  metroNIDAZOLE (FLAGYL) IVPB 500 mg     (MAR Hold since Thu 11/09/2018 at 1221. Reason: Transfer to a Procedural area.)   500 mg 100 mL/hr over 60 Minutes Intravenous Every 8 hours 11/08/18 2022     11/08/18 1645  vancomycin (VANCOCIN) 2,500 mg in sodium chloride 0.9 % 500 mL IVPB     2,500 mg 250 mL/hr over 120 Minutes Intravenous  Once 11/08/18 1616 11/08/18 1941   11/08/18 1615  vancomycin (VANCOCIN) IVPB 1000 mg/200 mL premix  Status:  Discontinued     1,000 mg 200 mL/hr over 60 Minutes Intravenous  Once 11/08/18 1600 11/08/18 1616   11/08/18 1615  cefTRIAXone (ROCEPHIN) 2 g in sodium chloride 0.9 % 100 mL IVPB     2 g 200 mL/hr over 30 Minutes Intravenous  Once 11/08/18 1600 11/08/18 1712     Subjective: Seen and examined at bedside prior to surgery and she is complaining of buttock pain which was a 10 out of 10.  No nausea or vomiting.  No other concerns or complaints at this time  Objective: Vitals:   11/09/18 1254 11/09/18 1426 11/09/18 1429 11/09/18 1430  BP:   (!) 192/69 (!) 183/75  Pulse:  97  (P) 97  Resp:  (!) 27  (!) 29  Temp:    (P) 98.3 F (36.8 C)  TempSrc:      SpO2:  98%  (P) 100%  Weight: 111.6 kg     Height: _0  (1.6 m)       Intake/Output Summary (Last 24 hours) at 11/09/2018 1434 Last data filed at 11/09/2018 1410 Gross per 24 hour  Intake 5356.39 ml  Output 1500 ml  Net 3856.39 ml   Filed Weights   11/08/18 1450 11/09/18 1254  Weight: 111.6 kg 111.6 kg   Examination: Physical Exam:  Constitutional: WN/WD obese African-American female, NAD and appears calm but uncomfortable Eyes: Lids and conjunctivae normal, sclerae anicteric  ENMT: External Ears, Nose appear normal. Grossly normal hearing.  Neck: Appears normal, supple, no cervical masses, normal ROM, no  appreciable thyromegaly; no JVD Respiratory: Diminished to auscultation bilaterally, no wheezing, rales, rhonchi or crackles. Normal respiratory effort and patient is not tachypenic. No accessory muscle use.  Cardiovascular: Irregularly irregular, no murmurs / rubs / gallops. S1 and S2 auscultated. 1+ extremity edema.  Abdomen: Soft, non-tender, Distended due to body habitus. No masses palpated. No appreciable hepatosplenomegaly. Bowel sounds positive x4.  GU: Deferred. Musculoskeletal: No clubbing / cyanosis of digits/nails. No joint deformity upper and lower extremities.  Skin: No rashes, lesions, ulcers on a limited skin  evaluation. No induration; Warm and dry.  Neurologic: CN 2-12 grossly intact with no focal deficits. Romberg sign and cerebellar reflexes not assessed.  Psychiatric: Normal judgment and insight. Alert and oriented x 3. Anxious mood and appropriate affect.   Data Reviewed: I have personally reviewed following labs and imaging studies  CBC: Recent Labs  Lab 11/08/18 1533 11/09/18 0344  WBC 27.5* 21.8*  NEUTROABS 24.5*  --   HGB 11.4* 9.9*  HCT 35.0* 30.0*  MCV 83.5 83.8  PLT 285 196   Basic Metabolic Panel: Recent Labs  Lab 11/08/18 1533 11/08/18 2037 11/08/18 2330 11/09/18 0344 11/09/18 0554  NA 129* 131* 134* 136  136 134*  K 5.1 4.9 4.9 4.7  4.7 5.0  CL 94* 101 105 110  109 108  CO2 16* 16* 15* 17*  16* 16*  GLUCOSE 516* 458* 271* 119*  118* 146*  BUN 125* 121* 123* 105*  105* 111*  CREATININE 4.76* 4.22* 3.90* 3.48*  3.54* 3.43*  CALCIUM 9.6 8.4* 8.8* 8.9  8.8* 8.6*  MG  --   --   --  2.3  --   PHOS  --   --   --  2.7  --    GFR: Estimated Creatinine Clearance: 16.8 mL/min (A) (by C-G formula based on SCr of 3.43 mg/dL (H)). Liver Function Tests: Recent Labs  Lab 11/08/18 1533 11/09/18 0344  AST 31 37  ALT 32 34  ALKPHOS 113 82  BILITOT 0.7 0.4  PROT 7.8 6.0*  ALBUMIN 3.2* 2.5*   No results for input(s): LIPASE, AMYLASE in the  last 168 hours. No results for input(s): AMMONIA in the last 168 hours. Coagulation Profile: Recent Labs  Lab 11/08/18 2037  INR 1.3*   Cardiac Enzymes: Recent Labs  Lab 11/08/18 1856 11/08/18 2330 11/09/18 0554 11/09/18 1206  TROPONINI 0.09* 0.10* 0.08* 0.07*   BNP (last 3 results) No results for input(s): PROBNP in the last 8760 hours. HbA1C: No results for input(s): HGBA1C in the last 72 hours. CBG: Recent Labs  Lab 11/09/18 0919 11/09/18 1022 11/09/18 1134 11/09/18 1235 11/09/18 1320  GLUCAP 174* 187* 181* 196* 215*   Lipid Profile: No results for input(s): CHOL, HDL, LDLCALC, TRIG, CHOLHDL, LDLDIRECT in the last 72 hours. Thyroid Function Tests: Recent Labs    11/09/18 0344  TSH 1.815   Anemia Panel: No results for input(s): VITAMINB12, FOLATE, FERRITIN, TIBC, IRON, RETICCTPCT in the last 72 hours. Sepsis Labs: Recent Labs  Lab 11/08/18 1856 11/08/18 2037 11/08/18 2330 11/09/18 0554  PROCALCITON  --  6.89  --   --   LATICACIDVEN 1.7 2.0* 2.9* 1.0    Recent Results (from the past 240 hour(s))  Blood Culture (routine x 2)     Status: None (Preliminary result)   Collection Time: 11/08/18  3:33 PM  Result Value Ref Range Status   Specimen Description   Final    BLOOD RIGHT ANTECUBITAL Performed at [facility], [place] [address] [place], [place] [address]    Special Requests   Final    BOTTLES DRAWN AEROBIC AND ANAEROBIC Blood Culture results may not be optimal due to an excessive volume of blood received in culture bottles Performed at [facility] [address], [place], [place] [address]    Culture   Final    NO GROWTH < 12 HOURS Performed at [facility] [address] [place], [place] [address]    Report Status PENDING  Incomplete  Blood Culture (routine x 2)     Status: None (Preliminary result)   Collection Time: 11/08/18  3:41 PM  Result Value Ref Range Status   Specimen Description   Final     BLOOD LEFT ANTECUBITAL Performed at [facility] [address] [place], [place] [address]    Special Requests   Final    BOTTLES DRAWN AEROBIC AND ANAEROBIC Blood Culture results may not be optimal due to an excessive volume of blood received in culture bottles Performed at [facility] [address], [place], [place] [address]    Culture   Final    NO GROWTH < 12 HOURS Performed at [facility] [address], [place], [place] [address]    Report Status PENDING  Incomplete  SARS Coronavirus 2 (CEPHEID- Performed in [facility] hospital lab), Hosp Order     Status: None   Collection Time: 11/08/18  4:07 PM  Result Value Ref Range Status   SARS Coronavirus 2 NEGATIVE NEGATIVE Final    Comment: (NOTE) If result is NEGATIVE SARS-CoV-2 target nucleic acids are NOT DETECTED. The SARS-CoV-2 RNA is generally detectable in upper and lower  respiratory specimens during the acute phase of infection. The lowest  concentration of SARS-CoV-2 viral copies this assay can detect is 250  copies / mL. A negative result does not preclude SARS-CoV-2 infection  and should not be used as the sole basis for treatment or other  patient management decisions.  A negative result may occur with  improper specimen collection / handling, submission of specimen other  than nasopharyngeal swab, presence of viral mutation(s) within the  areas targeted by this assay, and inadequate number of viral copies  (<250 copies / mL). A negative result must be combined with clinical  observations, patient history, and epidemiological information. If result is POSITIVE SARS-CoV-2 target nucleic acids are DETECTED. The SARS-CoV-2 RNA is generally detectable in upper and lower  respiratory specimens dur ing the acute phase of infection.  Positive  results are indicative of active infection with SARS-CoV-2.  Clinical  correlation with patient history and other diagnostic  information is  necessary to determine patient infection status.  Positive results do  not rule out bacterial infection or co-infection with other viruses. If result is PRESUMPTIVE POSTIVE SARS-CoV-2 nucleic acids MAY BE PRESENT.   A presumptive positive result was obtained on the submitted specimen  and confirmed on repeat testing.  While 2019 novel coronavirus  (SARS-CoV-2) nucleic acids may be present in the submitted sample  additional confirmatory testing may be necessary for epidemiological  and / or clinical management purposes  to differentiate between  SARS-CoV-2 and other Sarbecovirus currently known to infect humans.  If clinically indicated additional testing with an alternate test  methodology [phone]) is advised. The SARS-CoV-2 RNA is generally  detectable in upper and lower respiratory sp ecimens during the acute  phase of infection. The expected result is Negative. Fact Sheet for Patients:  StrictlyIdeas.no Fact Sheet for Healthcare Providers: BankingDealers.co.za This test is not yet approved or cleared by the [place] FDA and has been authorized for detection and/or diagnosis of SARS-CoV-2 by FDA under an Emergency Use Authorization (EUA).  This EUA will remain in effect (meaning this test can be used) for the duration of the COVID-19 declaration under Section 564(b)(1) of the Act, 21 U.S.C. section 360bbb-3(b)(1), unless the authorization is terminated or revoked sooner. Performed at [facility], [place] [address] [place],  [place] [address]   MRSA PCR Screening     Status: None   Collection Time: 11/08/18  8:54 PM  Result Value Ref Range Status   MRSA by PCR NEGATIVE NEGATIVE Final    Comment:        The GeneXpert MRSA Assay (FDA approved for NASAL specimens only), is one component of a comprehensive MRSA colonization surveillance program. It is not intended to diagnose MRSA infection nor  to guide or monitor treatment for MRSA infections. Performed at [facility], [place] [address], [place], [place] [address]     Radiology Studies: Ct Pelvis Wo Contrast  Result Date: 11/08/2018 CLINICAL DATA:  Painful red area to left buttock. EXAM: CT PELVIS WITHOUT CONTRAST TECHNIQUE: Multidetector CT imaging of the pelvis was performed following the standard protocol without intravenous contrast. COMPARISON:  10/03/2014 FINDINGS: Urinary Tract:  Bladder is moderately distended. Bowel: No small bowel or colonic dilatation within the visualized pelvis. Vascular/Lymphatic: Atherosclerotic calcification noted distal aorta and common iliac arteries. Reproductive: The uterus is surgically absent. There is no adnexal mass. Other:  No intraperitoneal free fluid. Musculoskeletal: No worrisome lytic or sclerotic osseous abnormality. 7.1 x 9.6 x 11.1 cm area of ill-defined, infiltrating edema/inflammation is identified in the medial left buttock region, adjacent to the intergluteal fold. There is gas in the fat, scattered through the area of edema. No focal or rim enhancing fluid collection evident. IMPRESSION: Edema and gas identified in the subcutaneous fat of the medial left gluteal region without associated abscess. Imaging features compatible with soft tissue infection/cellulitis. Electronically Signed   By: Misty Stanley M.D.   On: 11/08/2018 17:21   Dg Chest Port 1 View  Result Date: 11/08/2018 CLINICAL DATA:  78 year old female with buttock pain, hyperglycemia and fever EXAM: PORTABLE CHEST 1 VIEW COMPARISON:  Prior chest x-ray 06/12/2015 FINDINGS: The patient is rotated toward the right in the view is slightly lordotic as well. Stable cardiac and mediastinal contours. Calcified hilar nodes again noted bilaterally. The lungs are clear. Perhaps minimal atelectasis in the right lung base. No acute osseous abnormality. IMPRESSION: Stable chest x-ray without evidence of acute cardiopulmonary  process. Electronically Signed   By: Jacqulynn Cadet M.D.   On: 11/08/2018 16:08   Scheduled Meds: . [MAR Hold] allopurinol  300 mg Oral Daily  . [MAR Hold] calcitRIOL  0.25 mcg Oral Daily  . [MAR Hold] Chlorhexidine Gluconate Cloth  6 each Topical Daily  . [MAR Hold] cloNIDine  0.1 mg Oral BID  . [MAR Hold] insulin regular  0-10 Units Intravenous TID WC  . [MAR Hold] levothyroxine  50 mcg Oral Q0600  . [MAR Hold] mouth rinse  15 mL Mouth Rinse BID  . [MAR Hold] metoprolol succinate  25 mg Oral Daily  . [MAR Hold] pravastatin  80 mg Oral QHS  . [MAR Hold] sodium bicarbonate  650 mg Oral QID   Continuous Infusions: . sodium chloride Stopped (11/09/18 0022)  . [MAR Hold] ceFEPime (MAXIPIME) IV Stopped (11/09/18 1056)  . dextrose 5 % and 0.45% NaCl 75 mL/hr at 11/09/18 0424  . [MAR Hold] diltiazem (CARDIZEM) infusion 5 mg/hr (11/09/18 1213)  . insulin 7.8 Units/hr (11/09/18 1321)  . [MAR Hold] metronidazole 100 mL/hr at 11/09/18 1218  . [MAR Hold] vancomycin      LOS: 1 day   Kerney Elbe, DO [facility] PAGER is on [place]  If 7PM-7AM, please contact night-coverage [email or web address] Password [facility] 11/09/2018, 2:34 PM

## 2018-11-10 ENCOUNTER — Other Ambulatory Visit (HOSPITAL_COMMUNITY): Payer: Self-pay | Admitting: Lab

## 2018-11-10 ENCOUNTER — Encounter (HOSPITAL_COMMUNITY): Payer: Self-pay | Admitting: Surgery

## 2018-11-10 DIAGNOSIS — D649 Anemia, unspecified: Secondary | ICD-10-CM

## 2018-11-10 DIAGNOSIS — I248 Other forms of acute ischemic heart disease: Secondary | ICD-10-CM

## 2018-11-10 DIAGNOSIS — I483 Typical atrial flutter: Secondary | ICD-10-CM

## 2018-11-10 DIAGNOSIS — M4316 Spondylolisthesis, lumbar region: Secondary | ICD-10-CM

## 2018-11-10 LAB — URINALYSIS, ROUTINE W REFLEX MICROSCOPIC
Bilirubin Urine: NEGATIVE
Glucose, UA: NEGATIVE mg/dL
Hgb urine dipstick: NEGATIVE
Ketones, ur: NEGATIVE mg/dL
Nitrite: NEGATIVE
Protein, ur: NEGATIVE mg/dL
Specific Gravity, Urine: 1.013 (ref 1.005–1.030)
pH: 5 (ref 5.0–8.0)

## 2018-11-10 LAB — GLUCOSE, CAPILLARY
Glucose-Capillary: 106 mg/dL — ABNORMAL HIGH (ref 70–99)
Glucose-Capillary: 106 mg/dL — ABNORMAL HIGH (ref 70–99)
Glucose-Capillary: 128 mg/dL — ABNORMAL HIGH (ref 70–99)
Glucose-Capillary: 145 mg/dL — ABNORMAL HIGH (ref 70–99)
Glucose-Capillary: 152 mg/dL — ABNORMAL HIGH (ref 70–99)
Glucose-Capillary: 155 mg/dL — ABNORMAL HIGH (ref 70–99)
Glucose-Capillary: 169 mg/dL — ABNORMAL HIGH (ref 70–99)
Glucose-Capillary: 169 mg/dL — ABNORMAL HIGH (ref 70–99)
Glucose-Capillary: 170 mg/dL — ABNORMAL HIGH (ref 70–99)
Glucose-Capillary: 171 mg/dL — ABNORMAL HIGH (ref 70–99)
Glucose-Capillary: 175 mg/dL — ABNORMAL HIGH (ref 70–99)
Glucose-Capillary: 175 mg/dL — ABNORMAL HIGH (ref 70–99)
Glucose-Capillary: 176 mg/dL — ABNORMAL HIGH (ref 70–99)
Glucose-Capillary: 184 mg/dL — ABNORMAL HIGH (ref 70–99)
Glucose-Capillary: 190 mg/dL — ABNORMAL HIGH (ref 70–99)
Glucose-Capillary: 197 mg/dL — ABNORMAL HIGH (ref 70–99)
Glucose-Capillary: 200 mg/dL — ABNORMAL HIGH (ref 70–99)
Glucose-Capillary: 206 mg/dL — ABNORMAL HIGH (ref 70–99)
Glucose-Capillary: 216 mg/dL — ABNORMAL HIGH (ref 70–99)
Glucose-Capillary: 223 mg/dL — ABNORMAL HIGH (ref 70–99)
Glucose-Capillary: 94 mg/dL (ref 70–99)

## 2018-11-10 LAB — COMPREHENSIVE METABOLIC PANEL
ALT: 40 U/L (ref 0–44)
ALT: 49 U/L — ABNORMAL HIGH (ref 0–44)
AST: 47 U/L — ABNORMAL HIGH (ref 15–41)
AST: 65 U/L — ABNORMAL HIGH (ref 15–41)
Albumin: 2.1 g/dL — ABNORMAL LOW (ref 3.5–5.0)
Albumin: 2 g/dL — ABNORMAL LOW (ref 3.5–5.0)
Alkaline Phosphatase: 79 U/L (ref 38–126)
Alkaline Phosphatase: 73 U/L (ref 38–126)
Anion gap: 11 (ref 5–15)
Anion gap: 10 (ref 5–15)
BUN: 109 mg/dL — ABNORMAL HIGH (ref 8–23)
BUN: 104 mg/dL — ABNORMAL HIGH (ref 8–23)
CO2: 16 mmol/L — ABNORMAL LOW (ref 22–32)
CO2: 16 mmol/L — ABNORMAL LOW (ref 22–32)
Calcium: 8.7 mg/dL — ABNORMAL LOW (ref 8.9–10.3)
Calcium: 8.1 mg/dL — ABNORMAL LOW (ref 8.9–10.3)
Chloride: 108 mmol/L (ref 98–111)
Chloride: 103 mmol/L (ref 98–111)
Creatinine, Ser: 3.26 mg/dL — ABNORMAL HIGH (ref 0.44–1.00)
Creatinine, Ser: 3.25 mg/dL — ABNORMAL HIGH (ref 0.44–1.00)
GFR calc Af Amer: 15 mL/min — ABNORMAL LOW (ref >60)
GFR calc Af Amer: 15 mL/min — ABNORMAL LOW (ref >60)
GFR calc non Af Amer: 13 mL/min — ABNORMAL LOW (ref >60)
GFR calc non Af Amer: 13 mL/min — ABNORMAL LOW (ref >60)
Glucose, Bld: 203 mg/dL — ABNORMAL HIGH (ref 70–99)
Glucose, Bld: 237 mg/dL — ABNORMAL HIGH (ref 70–99)
Potassium: 4.9 mmol/L (ref 3.5–5.1)
Potassium: 4.4 mmol/L (ref 3.5–5.1)
Sodium: 135 mmol/L (ref 135–145)
Sodium: 129 mmol/L — ABNORMAL LOW (ref 135–145)
Total Bilirubin: 0.5 mg/dL (ref 0.3–1.2)
Total Bilirubin: 0.4 mg/dL (ref 0.3–1.2)
Total Protein: 5.6 g/dL — ABNORMAL LOW (ref 6.5–8.1)
Total Protein: 5.3 g/dL — ABNORMAL LOW (ref 6.5–8.1)

## 2018-11-10 LAB — BASIC METABOLIC PANEL
Anion gap: 9 (ref 5–15)
Anion gap: 11 (ref 5–15)
Anion gap: 9 (ref 5–15)
Anion gap: 11 (ref 5–15)
Anion gap: 13 (ref 5–15)
BUN: 112 mg/dL — ABNORMAL HIGH (ref 8–23)
BUN: 108 mg/dL — ABNORMAL HIGH (ref 8–23)
BUN: 104 mg/dL — ABNORMAL HIGH (ref 8–23)
BUN: 86 mg/dL — ABNORMAL HIGH (ref 8–23)
BUN: 110 mg/dL — ABNORMAL HIGH (ref 8–23)
CO2: 17 mmol/L — ABNORMAL LOW (ref 22–32)
CO2: 14 mmol/L — ABNORMAL LOW (ref 22–32)
CO2: 17 mmol/L — ABNORMAL LOW (ref 22–32)
CO2: 16 mmol/L — ABNORMAL LOW (ref 22–32)
CO2: 14 mmol/L — ABNORMAL LOW (ref 22–32)
Calcium: 8.6 mg/dL — ABNORMAL LOW (ref 8.9–10.3)
Calcium: 8.3 mg/dL — ABNORMAL LOW (ref 8.9–10.3)
Calcium: 8.4 mg/dL — ABNORMAL LOW (ref 8.9–10.3)
Calcium: 8.6 mg/dL — ABNORMAL LOW (ref 8.9–10.3)
Calcium: 8.2 mg/dL — ABNORMAL LOW (ref 8.9–10.3)
Chloride: 107 mmol/L (ref 98–111)
Chloride: 107 mmol/L (ref 98–111)
Chloride: 105 mmol/L (ref 98–111)
Chloride: 104 mmol/L (ref 98–111)
Chloride: 104 mmol/L (ref 98–111)
Creatinine, Ser: 3.27 mg/dL — ABNORMAL HIGH (ref 0.44–1.00)
Creatinine, Ser: 3.23 mg/dL — ABNORMAL HIGH (ref 0.44–1.00)
Creatinine, Ser: 3.28 mg/dL — ABNORMAL HIGH (ref 0.44–1.00)
Creatinine, Ser: 3.19 mg/dL — ABNORMAL HIGH (ref 0.44–1.00)
Creatinine, Ser: 3.17 mg/dL — ABNORMAL HIGH (ref 0.44–1.00)
GFR calc Af Amer: 15 mL/min — ABNORMAL LOW (ref >60)
GFR calc Af Amer: 15 mL/min — ABNORMAL LOW (ref >60)
GFR calc Af Amer: 15 mL/min — ABNORMAL LOW (ref >60)
GFR calc Af Amer: 16 mL/min — ABNORMAL LOW (ref >60)
GFR calc Af Amer: 16 mL/min — ABNORMAL LOW (ref >60)
GFR calc non Af Amer: 13 mL/min — ABNORMAL LOW (ref >60)
GFR calc non Af Amer: 13 mL/min — ABNORMAL LOW (ref >60)
GFR calc non Af Amer: 13 mL/min — ABNORMAL LOW (ref >60)
GFR calc non Af Amer: 13 mL/min — ABNORMAL LOW (ref >60)
GFR calc non Af Amer: 14 mL/min — ABNORMAL LOW (ref >60)
Glucose, Bld: 174 mg/dL — ABNORMAL HIGH (ref 70–99)
Glucose, Bld: 176 mg/dL — ABNORMAL HIGH (ref 70–99)
Glucose, Bld: 241 mg/dL — ABNORMAL HIGH (ref 70–99)
Glucose, Bld: 122 mg/dL — ABNORMAL HIGH (ref 70–99)
Glucose, Bld: 127 mg/dL — ABNORMAL HIGH (ref 70–99)
Potassium: 4.5 mmol/L (ref 3.5–5.1)
Potassium: 5.1 mmol/L (ref 3.5–5.1)
Potassium: 4.5 mmol/L (ref 3.5–5.1)
Potassium: 4.2 mmol/L (ref 3.5–5.1)
Potassium: 4.6 mmol/L (ref 3.5–5.1)
Sodium: 133 mmol/L — ABNORMAL LOW (ref 135–145)
Sodium: 132 mmol/L — ABNORMAL LOW (ref 135–145)
Sodium: 131 mmol/L — ABNORMAL LOW (ref 135–145)
Sodium: 131 mmol/L — ABNORMAL LOW (ref 135–145)
Sodium: 131 mmol/L — ABNORMAL LOW (ref 135–145)

## 2018-11-10 LAB — CBC WITH DIFFERENTIAL/PLATELET
Abs Immature Granulocytes: 0 10*3/uL (ref 0.00–0.07)
Band Neutrophils: 2 %
Basophils Absolute: 0 10*3/uL (ref 0.0–0.1)
Basophils Relative: 0 %
Eosinophils Absolute: 0.2 10*3/uL (ref 0.0–0.5)
Eosinophils Relative: 1 %
HCT: 28 % — ABNORMAL LOW (ref 36.0–46.0)
Hemoglobin: 8.7 g/dL — ABNORMAL LOW (ref 12.0–15.0)
Lymphocytes Relative: 9 %
Lymphs Abs: 1.7 10*3/uL (ref 0.7–4.0)
MCH: 26.7 pg (ref 26.0–34.0)
MCHC: 31.1 g/dL (ref 30.0–36.0)
MCV: 85.9 fL (ref 80.0–100.0)
Monocytes Absolute: 0.6 10*3/uL (ref 0.1–1.0)
Monocytes Relative: 3 %
Neutro Abs: 16.4 10*3/uL — ABNORMAL HIGH (ref 1.7–7.7)
Neutrophils Relative %: 85 %
Platelets: 251 10*3/uL (ref 150–400)
RBC: 3.26 MIL/uL — ABNORMAL LOW (ref 3.87–5.11)
RDW: 15.2 % (ref 11.5–15.5)
WBC: 18.9 10*3/uL — ABNORMAL HIGH (ref 4.0–10.5)
nRBC: 0 % (ref 0.0–0.2)

## 2018-11-10 LAB — PHOSPHORUS: Phosphorus: 4.1 mg/dL (ref 2.5–4.6)

## 2018-11-10 LAB — MAGNESIUM: Magnesium: 2.4 mg/dL (ref 1.7–2.4)

## 2018-11-10 MED ORDER — INSULIN GLARGINE 100 UNIT/ML ~~LOC~~ SOLN: 22.0000 [IU] | Freq: Every day | SUBCUTANEOUS | Status: DC

## 2018-11-10 MED ORDER — METOPROLOL SUCCINATE ER 50 MG PO TB24
50.0000 mg | ORAL_TABLET | Freq: Every day | ORAL | Status: DC
  Administered 2018-11-10 – 2018-11-29 (×19): 50 mg via ORAL
  Filled 2018-11-10 (×2): qty 1
  Filled 2018-11-10 (×2): qty 2
  Filled 2018-11-10: qty 1
  Filled 2018-11-10 (×2): qty 2
  Filled 2018-11-10 (×6): qty 1
  Filled 2018-11-10: qty 2
  Filled 2018-11-10 (×3): qty 1
  Filled 2018-11-10 (×2): qty 2
  Filled 2018-11-10: qty 1

## 2018-11-10 MED ORDER — ASPIRIN EC 81 MG PO TBEC
81.0000 mg | DELAYED_RELEASE_TABLET | Freq: Every day | ORAL | Status: DC
  Administered 2018-11-11 – 2018-11-29 (×19): 81 mg via ORAL
  Filled 2018-11-10 (×19): qty 1

## 2018-11-10 MED ORDER — INSULIN ASPART 100 UNIT/ML ~~LOC~~ SOLN: 0.0000 [IU] | Freq: Three times a day (TID) | SUBCUTANEOUS | Status: DC

## 2018-11-10 MED ORDER — ASPIRIN EC 81 MG PO TBEC: 81.0000 mg | DELAYED_RELEASE_TABLET | Freq: Every day | ORAL | Status: DC

## 2018-11-10 MED ORDER — INSULIN ASPART 100 UNIT/ML ~~LOC~~ SOLN
0.0000 [IU] | SUBCUTANEOUS | Status: DC
  Administered 2018-11-10: 2 [IU] via SUBCUTANEOUS
  Administered 2018-11-11: 3 [IU] via SUBCUTANEOUS
  Administered 2018-11-11 (×3): 2 [IU] via SUBCUTANEOUS
  Administered 2018-11-11: 3 [IU] via SUBCUTANEOUS
  Administered 2018-11-11: 2 [IU] via SUBCUTANEOUS
  Administered 2018-11-12: 3 [IU] via SUBCUTANEOUS
  Administered 2018-11-12: 2 [IU] via SUBCUTANEOUS
  Administered 2018-11-12 (×3): 3 [IU] via SUBCUTANEOUS
  Administered 2018-11-13: 01:00:00 2 [IU] via SUBCUTANEOUS
  Administered 2018-11-13: 16:00:00 3 [IU] via SUBCUTANEOUS
  Administered 2018-11-13 – 2018-11-14 (×4): 2 [IU] via SUBCUTANEOUS
  Administered 2018-11-15 – 2018-11-16 (×4): 3 [IU] via SUBCUTANEOUS
  Administered 2018-11-16 (×2): 2 [IU] via SUBCUTANEOUS
  Administered 2018-11-16 (×2): 3 [IU] via SUBCUTANEOUS
  Administered 2018-11-17: 2 [IU] via SUBCUTANEOUS
  Administered 2018-11-17: 5 [IU] via SUBCUTANEOUS
  Administered 2018-11-17: 3 [IU] via SUBCUTANEOUS
  Administered 2018-11-17: 8 [IU] via SUBCUTANEOUS
  Administered 2018-11-18 (×3): 5 [IU] via SUBCUTANEOUS
  Administered 2018-11-18: 3 [IU] via SUBCUTANEOUS
  Administered 2018-11-18: 2 [IU] via SUBCUTANEOUS
  Administered 2018-11-19: 3 [IU] via SUBCUTANEOUS
  Administered 2018-11-19: 2 [IU] via SUBCUTANEOUS
  Administered 2018-11-19: 3 [IU] via SUBCUTANEOUS

## 2018-11-10 MED ORDER — INSULIN ASPART 100 UNIT/ML ~~LOC~~ SOLN
6.0000 [IU] | Freq: Three times a day (TID) | SUBCUTANEOUS | Status: DC
  Administered 2018-11-13: 6 [IU] via SUBCUTANEOUS

## 2018-11-10 MED ORDER — DILTIAZEM HCL ER COATED BEADS 120 MG PO CP24
120.0000 mg | ORAL_CAPSULE | Freq: Every day | ORAL | Status: DC
  Administered 2018-11-10: 120 mg via ORAL
  Filled 2018-11-10: qty 1

## 2018-11-10 MED ORDER — INSULIN GLARGINE 100 UNIT/ML ~~LOC~~ SOLN
15.0000 [IU] | Freq: Every day | SUBCUTANEOUS | Status: DC
  Administered 2018-11-10 – 2018-11-20 (×11): 15 [IU] via SUBCUTANEOUS
  Filled 2018-11-10 (×11): qty 0.15

## 2018-11-10 NOTE — Progress Notes (Signed)
Dressing changed:    Wound measures 5 x 7 x 5 cm.  Dressing was malodorous, and you can see she still has some necrotic tissue within the wound base.  Most of it is clean and beefy.  The skin around the open area appears compromised and she may loose some skin around the wound.  She is very hard to move, and site is still exquisitely tender.  Packed with about 1/2 Kerlix.  Wet to dry dressing to site BID ordered.

## 2018-11-10 NOTE — Progress Notes (Signed)
1 Day Post-Op   Subjective/Chief Complaint: Complains of pain   Objective: Vital signs in last 24 hours: Temp:  [98.3 F (36.8 C)-99.1 F (37.3 C)] 98.7 F (37.1 C) (05/08 0400) Pulse Rate:  [32-110] 71 (05/08 0400) Resp:  [12-29] 24 (05/08 0400) BP: (132-192)/(45-75) 151/57 (05/08 0400) SpO2:  [94 %-100 %] 96 % (05/08 0400) Weight:  [110.3 kg-111.6 kg] 110.3 kg (05/08 0500)    Intake/Output from previous day: 05/07 0701 - 05/08 0700 In: 2231.3 [I.V.:1825.1; IV Piggyback:406.2] Out: 1630 [Urine:1580; Blood:50] Intake/Output this shift: No intake/output data recorded.  Exam: Held on dressing change currently given discomfort Dressing intact  Lab Results:  Recent Labs    11/09/18 0344 11/10/18 0233  WBC 21.8* 18.9*  HGB 9.9* 8.7*  HCT 30.0* 28.0*  PLT 249 251   BMET Recent Labs    11/09/18 2246 11/10/18 0233  NA 133* 135  K 5.6* 4.9  CL 108 108  CO2 16* 16*  GLUCOSE 164* 203*  BUN 104* 109*  CREATININE 3.22* 3.26*  CALCIUM 8.4* 8.7*   PT/INR Recent Labs    11/08/18 2037  LABPROT 16.5*  INR 1.3*   ABG Recent Labs    11/08/18 1639  HCO3 17.2*    Studies/Results: Ct Pelvis Wo Contrast  Result Date: 11/08/2018 CLINICAL DATA:  Painful red area to left buttock. EXAM: CT PELVIS WITHOUT CONTRAST TECHNIQUE: Multidetector CT imaging of the pelvis was performed following the standard protocol without intravenous contrast. COMPARISON:  10/03/2014 FINDINGS: Urinary Tract:  Bladder is moderately distended. Bowel: No small bowel or colonic dilatation within the visualized pelvis. Vascular/Lymphatic: Atherosclerotic calcification noted distal aorta and common iliac arteries. Reproductive: The uterus is surgically absent. There is no adnexal mass. Other:  No intraperitoneal free fluid. Musculoskeletal: No worrisome lytic or sclerotic osseous abnormality. 7.1 x 9.6 x 11.1 cm area of ill-defined, infiltrating edema/inflammation is identified in the medial left buttock  region, adjacent to the intergluteal fold. There is gas in the fat, scattered through the area of edema. No focal or rim enhancing fluid collection evident. IMPRESSION: Edema and gas identified in the subcutaneous fat of the medial left gluteal region without associated abscess. Imaging features compatible with soft tissue infection/cellulitis. Electronically Signed   By: Misty Stanley M.D.   On: 11/08/2018 17:21   Dg Chest Port 1 View  Result Date: 11/08/2018 CLINICAL DATA:  78 year old female with buttock pain, hyperglycemia and fever EXAM: PORTABLE CHEST 1 VIEW COMPARISON:  Prior chest x-ray 06/12/2015 FINDINGS: The patient is rotated toward the right in the view is slightly lordotic as well. Stable cardiac and mediastinal contours. Calcified hilar nodes again noted bilaterally. The lungs are clear. Perhaps minimal atelectasis in the right lung base. No acute osseous abnormality. IMPRESSION: Stable chest x-ray without evidence of acute cardiopulmonary process. Electronically Signed   By: Jacqulynn Cadet M.D.   On: 11/08/2018 16:08    Anti-infectives: Anti-infectives (From admission, onward)   Start     Dose/Rate Route Frequency Ordered Stop   11/10/18 2200  vancomycin (VANCOCIN) IVPB 750 mg/150 ml premix     750 mg 150 mL/hr over 60 Minutes Intravenous Every 48 hours 11/08/18 2126     11/09/18 1000  ceFEPIme (MAXIPIME) 2 g in sodium chloride 0.9 % 100 mL IVPB     2 g 200 mL/hr over 30 Minutes Intravenous Every 24 hours 11/09/18 0901     11/08/18 2200  ceFEPIme (MAXIPIME) 2 g in sodium chloride 0.9 % 100 mL IVPB  Status:  Discontinued     2 g 200 mL/hr over 30 Minutes Intravenous Every 24 hours 11/08/18 2126 11/09/18 0901   11/08/18 2030  metroNIDAZOLE (FLAGYL) IVPB 500 mg     500 mg 100 mL/hr over 60 Minutes Intravenous Every 8 hours 11/08/18 2022     11/08/18 1645  vancomycin (VANCOCIN) 2,500 mg in sodium chloride 0.9 % 500 mL IVPB     2,500 mg 250 mL/hr over 120 Minutes Intravenous   Once 11/08/18 1616 11/08/18 1941   11/08/18 1615  vancomycin (VANCOCIN) IVPB 1000 mg/200 mL premix  Status:  Discontinued     1,000 mg 200 mL/hr over 60 Minutes Intravenous  Once 11/08/18 1600 11/08/18 1616   11/08/18 1615  cefTRIAXone (ROCEPHIN) 2 g in sodium chloride 0.9 % 100 mL IVPB     2 g 200 mL/hr over 30 Minutes Intravenous  Once 11/08/18 1600 11/08/18 1712      Assessment/Plan: s/p Procedure(s): INCISION AND DRAINAGE ABSCESS (N/A)  Start BID wet to dry dressing changes Continue antibiotics Cultures pending  LOS: 2 days    Coralie Keens 11/10/2018

## 2018-11-10 NOTE — Progress Notes (Signed)
Inpatient Diabetes Program Recommendations  AACE/ADA: New Consensus Statement on Inpatient Glycemic Control (2015)  Target Ranges:  Prepandial:   less than 140 mg/dL      Peak postprandial:   less than 180 mg/dL (1-2 hours)      Critically ill patients:  140 - 180 mg/dL   Lab Results  Component Value Date   GLUCAP 206 (H) 11/10/2018   HGBA1C 7.2 (H) 07/12/2017    Review of Glycemic Control  I&D yesterday. CO2 still low.  AG 11 Continues on IV insulin drip. Glucose 176  Inpatient Diabetes Program Recommendations:      When criteria met for d/cing drip, give Lantus 1-2 hours prior to discontinuation of insulin drip. Lantus 15 units bid Novolog 0-15 units Q4H Novolog 6 units tidwc for meal coverage insulin. Case manager consult for problems in obtaining her diabetes meds  Continue to follow.  Thank you.  Thank you. Lorenda Peck, RD, LDN, CDE Inpatient Diabetes Coordinator [phone]

## 2018-11-10 NOTE — Progress Notes (Signed)
PROGRESS NOTE    Emily Hughes  SEG:[id number] DOB: 1941/04/15 DOA: 11/08/2018 PCP: Susy Frizzle, MD   Brief Narrative:  HPI per Dr. Toy Baker on 11/08/2018  Patient is a 78 year old African-American female with a past medical history significant for but not limited to diabetes mellitus type 2, CKD stage IV, hypothyroidism, CAD, GERD, hypertension, hyperlipidemia, history of seizure disorder, ? Atrial fibrillation, long with comorbidities who presented to the emergency room with a 3-day history of pain and swelling in her buttocks along with a fever of 102.  Family reportedly gave her some Tylenol that helped her improve her temperature down to 99 but she is found to have elevated blood pressures for last few days when in the 400s.  She has not been taking her insulin for last 2 days and has not been checking her glucose regularly.  On admission she stated that the left buttock pain lasted for last few days and was severe 10 out of 10 and reportedly was a boil that then progressed.  She was worked up and admitted for sepsis secondary to abscess of the left buttock as well as DKA.  She was placed in the stepdown unit and was placed on glucose stabilizer and has improvement in her blood sugars.  She was incidentally noted to be in atrial fibrillation.  Cardiology was consulted for preoperative clearance and patient was taken for incision and drainage of her abscess on her left buttock and she is POD1. DKA improved and she will be transitioned to Long Acting Insulin even though Her CO2 is not >20 given that she has chronic Metabolic Acidosis 2/2 to her Renal Disease and is on Sodium Bicarbonate. She tolerated her Diet without issues.   Assessment & Plan:   Active Problems:   Essential hypertension   CAD (coronary artery disease)   GERD (gastroesophageal reflux disease)   DKA (diabetic ketoacidoses) (HCC)   Chronic kidney disease (CKD), stage IV (severe) (HCC)   Abscess of left buttock   Sepsis (HCC)   Atrial fibrillation with RVR (HCC)   Elevated troponin  Sepsis secondary to the abscess of the left buttock s/p I and D of Abscess POD 1, improving -Patient met sepsis criteria with a fever, leukocytosis, and tachycardia (Atrial Fibrillation).  Leukocytosis now trending down -This protocol initiated in the ED and patient was given 30 mL's per KG doses -Sepsis physiology is improving -Lactic acid level is now improved -CT Pelvis scan showed "Edema and gas identified in the subcutaneous fat of the medial left gluteal region without associated abscess. Imaging features compatible with soft tissue infection/cellulitis/" -Patient was placed on broad-spectrum antibiotics including vancomycin and IV cefepime as well as metronidazole to cover anaerobes.  We will consider de-escalating antibiotics to just IV vancomycin along with ceftriaxone and clindamycin based on results of the culture sent from surgery -Procalcitonin level was 6.89 and lactic acid trended down from 2.9 is now 1.0 -WBC went from 27.5 -> 21.8 -> 18.9 -SARS-CoV-2 Negative  -General surgery was consulted for further evaluation and patient was rehydrated and treated for her medical complications -Since blood sugars are better controlled today patient will be going for surgery and cardiology is evaluated for preoperative clearance next -Continue monitor temperature curve and cultures; Blood Cx x2 showed NGTD at 2 Days -Aerobic/Anaerobic Gram Stain showed:  ABUNDANT WBC PRESENT,BOTH PMN AND MONONUCLEAR  ABUNDANT GRAM POSITIVE COCCI  ABUNDANT GRAM NEGATIVE RODS   Culture FEW GRAM NEGATIVE RODS  IDENTIFICATION AND SUSCEPTIBILITIES TO FOLLOW   -  MRSA negative -Unclear why patient was placed on contact precautions however there is no evidence that the patient needs to be a contact precautions and will be discontinued -General Surgery Recommending BID Wet to dry Dressing Changes; Patient would not allow me to view wound but  allowed Surgery PA to change and as evidenced by his note -Repeat CBC  In AM   DKA in the setting of Diabetes Mellitus Type 2 -Patient was placed on glucose stabilizer and rehydrated and other collections are being corrected -We will transition to home dose of insulin per protocol once anion gap closes and CO2 is greater than 202 however she has chronic Metabolic Acidosis and has been stable so will transition as she is tolerating her mea;s  -CO2 this Afternoon was 16 and AG was 10 -Continued glucose stabilizer and discontinue 1-2 Hours after transitioned to Long Acting  -CBGs are improving and currently ranging from 108-206 now so will continue on D5 1/2 NS until Insulin gtt is stopped  -On admission patient had a Beta-Hydroxybutyric acid of 2.29 -Continue antibiotics as above -Patient likely in DKA secondary to infection from left buttock abscess -Will Transition to 15 units of Lantus Daily, Start on Moderate Novolog SSI q4h, and 6 units TIDwm -We will need diabetes education coordinator to follow and appreciate input  -We will need hemoglobin A1c and check in AM -Continue to Monitor CBG's carefully   Essential Hypertension -Resume home medications when able to;  -C/w Home Metoprolol XL 25 mg po Daily  -She was started on a Cardizem drip and p.o. diltiazem has now been discontinued but she converted to NSR and Cardizem gtt was stopped and She was started on Cardizem 120 mg po Daily; This was held due to Bradycardia  -We will continue Clonidine 0.1 mg p.o. twice daily -Need to monitor closely and adjust medication as accordingly  CAD -Currently holding off on aspirin given surgical intervention needed and will resume in AM  -We will continue metoprolol -Patient is currently chest pain-free -Cardiology is evaluating and feels no ischemic work-up is needed currently in recommend resuming home Toprol 25 mg p.o. daily along with pravastatin and aspirin after her procedures done  Elevated  Troponin -Denies any chest pain and there is no ischemic changes on EKG -Likely in the setting of CKD and worsening renal clearance -Likely demand ischemia in the setting of sepsis -Continue to cycle cardiac enzymes have been trending down and went from 0.09 -> 0.10 -> 0.08 -> 0.07 -Echocardiogram has been ordered and pending to be done  AKI on CKD stage IV Metabolic Acidosis  -Patient presented with a BUN/creatinine of 125/4.76 is now improving and is 104/3.25 -Continued with IV fluid hydration with  D5 half-normal saline at 75 mL/hr when blood sugars are less than 250 per protocol and will stop when Insulin gtt stops  -Avoid Nephrotoxic medications, contrast dyes, as well as hypertension if possible -Continue with Calcitriol -Continue with sodium bicarb 650 mg p.o. 4 times daily -Continue monitor and trend renal function -There is been discussions for possible need for dialysis in the future however patient is continually declined -Repeat CMP in a.m.  Atrial fibrillation/flutter with RVR versus MAT; Cardiology feels this is atrial flutter with variable block s/p Conversion to NSR -Cardiology was consulted feels this is new in the setting of sepsis -Echo has been ordered along with TSH and cardiac enzymes are being cycled; Echo still isn't done  -Patient was started on a Cardizem drip and p.o. Cardizem was  held (Takes 300 mg po Daily at home); Since she converted to NSR last night Cardizem gtt stopped and she was started on Cardizem CD 120 mg Daily but this was held due to mild Bradycardia -Patient will be restarted on metoprolol XL 25 mg p.o. daily -Cardiology feels there is no need for anticoagulation at this time given her short duration need for surgical intervention -We will continue monitor closely on telemetry for recurrence -Check ECHOCardiogram   Hypothyroidism -Checked TSH and was 1.815 -Continue levothyroxine 50 mcg p.o. daily  Morbid Obesity -Estimated body mass index is  43.08 kg/m as calculated from the following:   Height as of this encounter: 5' 3"  (1.6 m).   Weight as of this encounter: 110.3 kg. -Weight Loss and Dietary Counseling given   History of Gout -Currently colchicine is being held but will resume allopurinol 300 mg p.o. daily  Abnormal LFT's -? Reactive or Medication Induced -AST went from 37 -> 47 -> 65 -ALT went from 34 -> 40 -> 49 -Check RUQ U/S and Acute Hepatitis Panel -Continue to Monitor and Trend Hepatic Function -Repeat CMP in AM  Hyponatremia -Patient's Na+ went from 129 -> 131 -> 134 -> 133 -> 135 -> 132 -> 131 -> 129 -IVF to be discontinued later this evening and Likely in the setting of D5 1/2 NS complicated by Hyperglycemia  -Continue to Monitor and Trend -Repeat CMP in AM   Normocytic Anemia/Anemia of Chronic Kidney Disease -Patient's Hb/Hct went from 11.4/35.0 -> 9.9/30.0 -> 8.7/28.0 -Likely also has a Dilutional Drop and Post-operative drop -Check Anemia Panel in the AM -Continue to Monitor for S/Sx of Bleeding -Repeat CMP in AM   DVT prophylaxis: SCDs Code Status: FULL CODE Family Communication: No family present at bedside  Disposition Plan: Pending Improvement of DKA, Abscess, and A Flutter  Consultants:   General Surgery  Cardiology   Procedures:  I and D of Left Buttock and Perinanal Abscess done by Dr. Ninfa Linden on 11/09/2018  Antimicrobials:  Anti-infectives (From admission, onward)   Start     Dose/Rate Route Frequency Ordered Stop   11/10/18 2200  vancomycin (VANCOCIN) IVPB 750 mg/150 ml premix     750 mg 150 mL/hr over 60 Minutes Intravenous Every 48 hours 11/08/18 2126     11/09/18 1000  ceFEPIme (MAXIPIME) 2 g in sodium chloride 0.9 % 100 mL IVPB     2 g 200 mL/hr over 30 Minutes Intravenous Every 24 hours 11/09/18 0901     11/08/18 2200  ceFEPIme (MAXIPIME) 2 g in sodium chloride 0.9 % 100 mL IVPB  Status:  Discontinued     2 g 200 mL/hr over 30 Minutes Intravenous Every 24 hours 11/08/18  2126 11/09/18 0901   11/08/18 2030  metroNIDAZOLE (FLAGYL) IVPB 500 mg     500 mg 100 mL/hr over 60 Minutes Intravenous Every 8 hours 11/08/18 2022     11/08/18 1645  vancomycin (VANCOCIN) 2,500 mg in sodium chloride 0.9 % 500 mL IVPB     2,500 mg 250 mL/hr over 120 Minutes Intravenous  Once 11/08/18 1616 11/08/18 1941   11/08/18 1615  vancomycin (VANCOCIN) IVPB 1000 mg/200 mL premix  Status:  Discontinued     1,000 mg 200 mL/hr over 60 Minutes Intravenous  Once 11/08/18 1600 11/08/18 1616   11/08/18 1615  cefTRIAXone (ROCEPHIN) 2 g in sodium chloride 0.9 % 100 mL IVPB     2 g 200 mL/hr over 30 Minutes Intravenous  Once 11/08/18 1600 11/08/18 1712  Subjective: Seen and examined at bedside same and had converted to normal sinus rhythm.  She states that she had some pain but is well controlled with her pain medication.  No nausea or vomiting.  Would not want to turn for me to view her buttocks wound.  No other concerns or complaints at this time but was feeling hungry.  Objective: Vitals:   11/10/18 1300 11/10/18 1400 11/10/18 1538 11/10/18 1600  BP: (!) 118/55 (!) 116/47  119/62  Pulse: 63 60  65  Resp:    (!) 21  Temp:   98.5 F (36.9 C)   TempSrc:   Oral   SpO2: 93% 93%  95%  Weight:      Height:        Intake/Output Summary (Last 24 hours) at 11/10/2018 1808 Last data filed at 11/10/2018 1600 Gross per 24 hour  Intake 2104.04 ml  Output 670 ml  Net 1434.04 ml   Filed Weights   11/08/18 1450 11/09/18 1254 11/10/18 0500  Weight: 111.6 kg 111.6 kg 110.3 kg   Examination: Physical Exam:  Constitutional: Well-nourished, well-developed obese African-American female currently in no acute distress appears calm and slightly uncomfortable Eyes: Lids and conjunctive are normal.  Sclera anicteric ENMT: External ears and nose appear normal.  Grossly normal hearing Neck: Appears supple no JVD Respiratory: Diminished auscultation bilaterally no appreciable wheezing, rales,  rhonchi.  Patient not tachypneic wheezing and accessory muscle breathe.  She is wearing supplemental oxygen via nasal cannula however Cardiovascular: Regular rate and rhythm.  No appreciable murmurs, rubs or gallops.  Has 1+ lower extremity edema Abdomen: Soft, nontender, distended second body habitus.  Bowel sounds present all 4 quadrants GU: Deferred Musculoskeletal: No contractures or cyanosis.  No joint deformities in upper lower extremities Skin: Has a buttocks wound that she will not return for me to view.  No appreciable rashes or lesions on to skin evaluation Neurologic: Cranial nerves II through XII grossly intact no appreciable focal deficits Psychiatric: Normal judgment and insight.  Patient is awake, alert and oriented.  Slightly anxious but is improved from yesterday  Data Reviewed: I have personally reviewed following labs and imaging studies  CBC: Recent Labs  Lab 11/08/18 1533 11/09/18 0344 11/10/18 0233  WBC 27.5* 21.8* 18.9*  NEUTROABS 24.5*  --  16.4*  HGB 11.4* 9.9* 8.7*  HCT 35.0* 30.0* 28.0*  MCV 83.5 83.8 85.9  PLT 285 249 015   Basic Metabolic Panel: Recent Labs  Lab 11/09/18 0344  11/10/18 0233 11/10/18 0702 11/10/18 1021 11/10/18 1440 11/10/18 1512  NA 136  136   < > 135 133* 132* 131* 129*  K 4.7  4.7   < > 4.9 4.5 5.1 4.5 4.4  CL 110  109   < > 108 107 107 105 103  CO2 17*  16*   < > 16* 17* 14* 17* 16*  GLUCOSE 119*  118*   < > 203* 174* 176* 241* 237*  BUN 105*  105*   < > 109* 112* 108* 104* 104*  CREATININE 3.48*  3.54*   < > 3.26* 3.27* 3.23* 3.28* 3.25*  CALCIUM 8.9  8.8*   < > 8.7* 8.6* 8.3* 8.4* 8.1*  MG 2.3  --  2.4  --   --   --   --   PHOS 2.7  --  4.1  --   --   --   --    < > = values in this interval not displayed.  GFR: Estimated Creatinine Clearance: 17.6 mL/min (A) (by C-G formula based on SCr of 3.25 mg/dL (H)). Liver Function Tests: Recent Labs  Lab 11/08/18 1533 11/09/18 0344 11/10/18 0233 11/10/18 1512   AST 31 37 47* 65*  ALT 32 34 40 49*  ALKPHOS 113 82 79 73  BILITOT 0.7 0.4 0.5 0.4  PROT 7.8 6.0* 5.6* 5.3*  ALBUMIN 3.2* 2.5* 2.1* 2.0*   No results for input(s): LIPASE, AMYLASE in the last 168 hours. No results for input(s): AMMONIA in the last 168 hours. Coagulation Profile: Recent Labs  Lab 11/08/18 2037  INR 1.3*   Cardiac Enzymes: Recent Labs  Lab 11/08/18 1856 11/08/18 2330 11/09/18 0554 11/09/18 1206  TROPONINI 0.09* 0.10* 0.08* 0.07*   BNP (last 3 results) No results for input(s): PROBNP in the last 8760 hours. HbA1C: No results for input(s): HGBA1C in the last 72 hours. CBG: Recent Labs  Lab 11/10/18 1249 11/10/18 1400 11/10/18 1502 11/10/18 1603 11/10/18 1708  GLUCAP 200* 206* 223* 216* 169*   Lipid Profile: No results for input(s): CHOL, HDL, LDLCALC, TRIG, CHOLHDL, LDLDIRECT in the last 72 hours. Thyroid Function Tests: Recent Labs    11/09/18 0344  TSH 1.815   Anemia Panel: No results for input(s): VITAMINB12, FOLATE, FERRITIN, TIBC, IRON, RETICCTPCT in the last 72 hours. Sepsis Labs: Recent Labs  Lab 11/08/18 1856 11/08/18 2037 11/08/18 2330 11/09/18 0554  PROCALCITON  --  6.89  --   --   LATICACIDVEN 1.7 2.0* 2.9* 1.0    Recent Results (from the past 240 hour(s))  Blood Culture (routine x 2)     Status: None (Preliminary result)   Collection Time: 11/08/18  3:33 PM  Result Value Ref Range Status   Specimen Description   Final    BLOOD RIGHT ANTECUBITAL Performed at [facility], [place] [address], [place], [place] [address]    Special Requests   Final    BOTTLES DRAWN AEROBIC AND ANAEROBIC Blood Culture results may not be optimal due to an excessive volume of blood received in culture bottles Performed at [place] [address], [place], [place] [address]    Culture   Final    NO GROWTH 2 DAYS Performed at [place] [address], [place], [place] [address]    Report Status  PENDING  Incomplete  Blood Culture (routine x 2)     Status: None (Preliminary result)   Collection Time: 11/08/18  3:41 PM  Result Value Ref Range Status   Specimen Description   Final    BLOOD LEFT ANTECUBITAL Performed at [place] [address], [place], [place] [address]    Special Requests   Final    BOTTLES DRAWN AEROBIC AND ANAEROBIC Blood Culture results may not be optimal due to an excessive volume of blood received in culture bottles Performed at [place] [address] [place], [place] [address]    Culture   Final    NO GROWTH 2 DAYS Performed at [place] [address], [place], [place] [address]    Report Status PENDING  Incomplete  SARS Coronavirus 2 (CEPHEID- Performed in [employer] hospital lab), Hosp Order     Status: None   Collection Time: 11/08/18  4:07 PM  Result Value Ref Range Status   SARS Coronavirus 2 NEGATIVE NEGATIVE Final    Comment: (NOTE) If result is NEGATIVE SARS-CoV-2 target nucleic acids are NOT DETECTED. The SARS-CoV-2 RNA  is generally detectable in upper and lower  respiratory specimens during the acute phase of infection. The lowest  concentration of SARS-CoV-2 viral copies this assay can detect is 250  copies / mL. A negative result does not preclude SARS-CoV-2 infection  and should not be used as the sole basis for treatment or other  patient management decisions.  A negative result may occur with  improper specimen collection / handling, submission of specimen other  than nasopharyngeal swab, presence of viral mutation(s) within the  areas targeted by this assay, and inadequate number of viral copies  (<250 copies / mL). A negative result must be combined with clinical  observations, patient history, and epidemiological information. If result is POSITIVE SARS-CoV-2 target nucleic acids are DETECTED. The SARS-CoV-2 RNA is generally detectable in upper and lower  respiratory  specimens dur ing the acute phase of infection.  Positive  results are indicative of active infection with SARS-CoV-2.  Clinical  correlation with patient history and other diagnostic information is  necessary to determine patient infection status.  Positive results do  not rule out bacterial infection or co-infection with other viruses. If result is PRESUMPTIVE POSTIVE SARS-CoV-2 nucleic acids MAY BE PRESENT.   A presumptive positive result was obtained on the submitted specimen  and confirmed on repeat testing.  While 2019 novel coronavirus  (SARS-CoV-2) nucleic acids may be present in the submitted sample  additional confirmatory testing may be necessary for epidemiological  and / or clinical management purposes  to differentiate between  SARS-CoV-2 and other Sarbecovirus currently known to infect humans.  If clinically indicated additional testing with an alternate test  methodology [phone]) is advised. The SARS-CoV-2 RNA is generally  detectable in upper and lower respiratory sp ecimens during the acute  phase of infection. The expected result is Negative. Fact Sheet for Patients:  StrictlyIdeas.no Fact Sheet for Healthcare Providers: BankingDealers.co.za This test is not yet approved or cleared by the [place] FDA and has been authorized for detection and/or diagnosis of SARS-CoV-2 by FDA under an Emergency Use Authorization (EUA).  This EUA will remain in effect (meaning this test can be used) for the duration of the COVID-19 declaration under Section 564(b)(1) of the Act, 21 U.S.C. section 360bbb-3(b)(1), unless the authorization is terminated or revoked sooner. Performed at [facility], [place] [address], [place], [place] [address]   MRSA PCR Screening     Status: None   Collection Time: 11/08/18  8:54 PM  Result Value Ref Range Status   MRSA by PCR NEGATIVE NEGATIVE Final    Comment:        The  GeneXpert MRSA Assay (FDA approved for NASAL specimens only), is one component of a comprehensive MRSA colonization surveillance program. It is not intended to diagnose MRSA infection nor to guide or monitor treatment for MRSA infections. Performed at [facility], [place] [address] [place], [place] [address]   Aerobic/Anaerobic Culture (surgical/deep wound)     Status: None (Preliminary result)   Collection Time: 11/09/18  2:06 PM  Result Value Ref Range Status   Specimen Description   Final    ABSCESS LEFT BUTTOCKS Performed at [facility] [address] [place], [place] [address]    Special Requests   Final    NONE Performed at [facility], [place] [address], [place], [place] [address]    Gram Stain   Final    ABUNDANT WBC PRESENT,BOTH PMN AND MONONUCLEAR ABUNDANT GRAM POSITIVE COCCI  ABUNDANT GRAM NEGATIVE RODS    Culture   Final    FEW GRAM NEGATIVE RODS IDENTIFICATION AND SUSCEPTIBILITIES TO FOLLOW Performed at [facility], [place] [address], [place], [place] [address]    Report Status PENDING  Incomplete    Radiology Studies: No results found. Scheduled Meds: . allopurinol  300 mg Oral Daily  . calcitRIOL  0.25 mcg Oral Daily  . Chlorhexidine Gluconate Cloth  6 each Topical Daily  . cloNIDine  0.1 mg Oral BID  . diltiazem  120 mg Oral Daily  . insulin aspart  0-15 Units Subcutaneous Q4H  . [START ON 11/11/2018] insulin aspart  6 Units Subcutaneous TID WC  . insulin glargine  15 Units Subcutaneous Daily  . insulin regular  0-10 Units Intravenous TID WC  . levothyroxine  50 mcg Oral Q0600  . mouth rinse  15 mL Mouth Rinse BID  . metoprolol succinate  50 mg Oral Daily  . pravastatin  80 mg Oral QHS  . sodium bicarbonate  650 mg Oral QID   Continuous Infusions: . ceFEPime (MAXIPIME) IV Stopped (11/10/18 1125)  . dextrose 5 % and 0.45% NaCl 75 mL/hr at 11/10/18 1600  . insulin 2 Units/hr (11/09/18 1846)   . metronidazole Stopped (11/10/18 1317)  . vancomycin      LOS: 2 days   Kerney Elbe, DO [facility] PAGER is on [place]  If 7PM-7AM, please contact night-coverage [email or web address] Password [facility] 11/10/2018, 6:08 PM

## 2018-11-10 NOTE — Progress Notes (Signed)
Progress Note  Patient Name: Marnee Sherrard Date of Encounter: 11/10/2018  Primary Cardiologist: Dr Percival Spanish  Subjective   Feeling better today.  Inpatient Medications    Scheduled Meds: . allopurinol  300 mg Oral Daily  . calcitRIOL  0.25 mcg Oral Daily  . Chlorhexidine Gluconate Cloth  6 each Topical Daily  . cloNIDine  0.1 mg Oral BID  . insulin regular  0-10 Units Intravenous TID WC  . levothyroxine  50 mcg Oral Q0600  . mouth rinse  15 mL Mouth Rinse BID  . metoprolol succinate  50 mg Oral Daily  . pravastatin  80 mg Oral QHS  . sodium bicarbonate  650 mg Oral QID   Continuous Infusions: . ceFEPime (MAXIPIME) IV 2 g (11/10/18 1055)  . dextrose 5 % and 0.45% NaCl 75 mL/hr at 11/10/18 1000  . diltiazem (CARDIZEM) infusion 5 mg/hr (11/10/18 1000)  . insulin 2 Units/hr (11/09/18 1846)  . metronidazole Stopped (11/10/18 0532)  . vancomycin     PRN Meds: acetaminophen **OR** acetaminophen, dextrose, HYDROcodone-acetaminophen, morphine injection, ondansetron **OR** ondansetron (ZOFRAN) IV   Vital Signs    Vitals:   11/10/18 0800 11/10/18 0900 11/10/18 1000 11/10/18 1100  BP: (!) 113/44 (!) 113/45 133/61 (!) 130/55  Pulse: 67 67 73 68  Resp: 20 (!) 24 15 16   Temp: 98.2 F (36.8 C)     TempSrc: Oral     SpO2: 98% 96% 98% 98%  Weight:      Height:        Intake/Output Summary (Last 24 hours) at 11/10/2018 1139 Last data filed at 11/10/2018 1000 Gross per 24 hour  Intake 2825.79 ml  Output 1630 ml  Net 1195.79 ml   Last 3 Weights 11/10/2018 11/09/2018 11/08/2018  Weight (lbs) 243 lb 2.7 oz 246 lb 246 lb  Weight (kg) 110.3 kg 111.585 kg 111.585 kg     Telemetry    SR - Personally Reviewed  ECG    No new tracing - Personally Reviewed  Physical Exam   GEN: No acute distress.   Neck: No JVD Cardiac: RRR, no murmurs, rubs, or gallops.  Respiratory: Clear to auscultation bilaterally. GI: Soft, nontender, non-distended  MS: No edema; No deformity. Neuro:   Nonfocal  Psych: Normal affect   Labs    Chemistry Recent Labs  Lab 11/08/18 1533  11/09/18 0344  11/10/18 0233 11/10/18 0702 11/10/18 1021  NA 129*   < > 136  136   < > 135 133* 132*  K 5.1   < > 4.7  4.7   < > 4.9 4.5 5.1  CL 94*   < > 110  109   < > 108 107 107  CO2 16*   < > 17*  16*   < > 16* 17* 14*  GLUCOSE 516*   < > 119*  118*   < > 203* 174* 176*  BUN 125*   < > 105*  105*   < > 109* 112* 108*  CREATININE 4.76*   < > 3.48*  3.54*   < > 3.26* 3.27* 3.23*  CALCIUM 9.6   < > 8.9  8.8*   < > 8.7* 8.6* 8.3*  PROT 7.8  --  6.0*  --  5.6*  --   --   ALBUMIN 3.2*  --  2.5*  --  2.1*  --   --   AST 31  --  37  --  47*  --   --  ALT 32  --  34  --  40  --   --   ALKPHOS 113  --  82  --  79  --   --   BILITOT 0.7  --  0.4  --  0.5  --   --   GFRNONAA 8*   < > 12*  12*   < > 13* 13* 13*  GFRAA 10*   < > 14*  14*   < > 15* 15* 15*  ANIONGAP 19*   < > 9  11   < > 11 9 11    < > = values in this interval not displayed.    Hematology Recent Labs  Lab 11/08/18 1533 11/09/18 0344 11/10/18 0233  WBC 27.5* 21.8* 18.9*  RBC 4.19 3.58* 3.26*  HGB 11.4* 9.9* 8.7*  HCT 35.0* 30.0* 28.0*  MCV 83.5 83.8 85.9  MCH 27.2 27.7 26.7  MCHC 32.6 33.0 31.1  RDW 14.9 14.8 15.2  PLT 285 249 251   Cardiac Enzymes Recent Labs  Lab 11/08/18 1856 11/08/18 2330 11/09/18 0554 11/09/18 1206  TROPONINI 0.09* 0.10* 0.08* 0.07*   No results for input(s): TROPIPOC in the last 168 hours.   BNPNo results for input(s): BNP, PROBNP in the last 168 hours.   DDimer No results for input(s): DDIMER in the last 168 hours.   Radiology    Ct Pelvis Wo Contrast  Result Date: 11/08/2018 CLINICAL DATA:  Painful red area to left buttock. EXAM: CT PELVIS WITHOUT CONTRAST TECHNIQUE: Multidetector CT imaging of the pelvis was performed following the standard protocol without intravenous contrast. COMPARISON:  10/03/2014 FINDINGS: Urinary Tract:  Bladder is moderately distended. Bowel: No small  bowel or colonic dilatation within the visualized pelvis. Vascular/Lymphatic: Atherosclerotic calcification noted distal aorta and common iliac arteries. Reproductive: The uterus is surgically absent. There is no adnexal mass. Other:  No intraperitoneal free fluid. Musculoskeletal: No worrisome lytic or sclerotic osseous abnormality. 7.1 x 9.6 x 11.1 cm area of ill-defined, infiltrating edema/inflammation is identified in the medial left buttock region, adjacent to the intergluteal fold. There is gas in the fat, scattered through the area of edema. No focal or rim enhancing fluid collection evident. IMPRESSION: Edema and gas identified in the subcutaneous fat of the medial left gluteal region without associated abscess. Imaging features compatible with soft tissue infection/cellulitis. Electronically Signed   By: Misty Stanley M.D.   On: 11/08/2018 17:21   Dg Chest Port 1 View  Result Date: 11/08/2018 CLINICAL DATA:  78 year old female with buttock pain, hyperglycemia and fever EXAM: PORTABLE CHEST 1 VIEW COMPARISON:  Prior chest x-ray 06/12/2015 FINDINGS: The patient is rotated toward the right in the view is slightly lordotic as well. Stable cardiac and mediastinal contours. Calcified hilar nodes again noted bilaterally. The lungs are clear. Perhaps minimal atelectasis in the right lung base. No acute osseous abnormality. IMPRESSION: Stable chest x-ray without evidence of acute cardiopulmonary process. Electronically Signed   By: Jacqulynn Cadet M.D.   On: 11/08/2018 16:08    Cardiac Studies     Patient Profile     78 y.o. female   [place]    1.  Elevated troponin: -Pt presented to [facility] on 11/08/2018 with a 3-day history of gluteal pain with associated fevers (max-102) and chills found to have an indurated area of the left medial gluteal fold on ED presentation.  She underwent a CT scan of the pelvis which showed an abscess in the subcutaneous tissue of this area.  -  Troponin levels were  drawn and found to be mildly elevated at 0.09, 0.10 and 0.08 which are flat and not consistent with ACS and more consistent with demand ischemia in the settings of sepsis, elevated lactic acid, and acute on chronic kidney failure. No recent complaints of anginal symptoms.  -EKG initially with questionable a flutter versus sinus tach with PACs and repeat EKG from today with NSR and evidence of LVH, no evidence for ischemia -On home ASA 81 which is currently on hold secondary to the need for surgical intervention -Home Toprol 25, pravastatin resumed - no ischemic workup is needed  2. Atrial flutter with variable block - new, in the settings of sepsis - now in SR - d/c iv cardizem, start cardizem CD 120 mg po daily -no need for anticoagulation at this time given short duration and need for surgical intervention -Continue to monitor closely on telemetry for recurrence  3.  Left gluteal abscess with sepsis: -Patient presented with a 3-day history of left gluteal pain with fevers and chills found to have a gluteal abscess per CT scan with plans for surgical intervention per general surgery team once DKA has stabilized -post surgery yesterday  4.  DKA: -Glucose on presentation greater than 500 with initiation of insulin gtt. in the emergency department -More stable now -Patient reports not taking her SQ insulin for several days prior to admission -Also on glimepiride PO  -Once transition off glucose stabilizer, SSI for glucose control -May need diabetic counseling prior to discharge -Further management per primary team  5.  CKD stage IV: -Creatinine on presentation, 4.22 with mild improvement today at 3.48 -Patient reports following with nephrology in the outpatient setting -They have discussed the possible need for dialysis in the future however patient declines -Nephrology consult  5.  Hypertension: -Elevated, 145/53, 146/49, 142/39 -Likely in the setting of increased pain -On home  Toprol 25, diltiazem 300, clonidine 0.1 mg twice daily -May need further medication titration however will hold off in the setting of acute illness and follow in the postsurgical setting  [facility] will sign off.   Medication Recommendations:  As above Other recommendations (labs, testing, etc):  No further testing needed Follow up as an outpatient:   As needed  For questions or updates, please contact [place] Please consult [email or web address] for contact info under     Signed, Ena Dawley, MD  11/10/2018, 11:39 AM

## 2018-11-10 NOTE — Anesthesia Postprocedure Evaluation (Signed)
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

## 2018-11-11 ENCOUNTER — Inpatient Hospital Stay (HOSPITAL_COMMUNITY): Payer: PPO

## 2018-11-11 DIAGNOSIS — I361 Nonrheumatic tricuspid (valve) insufficiency: Secondary | ICD-10-CM

## 2018-11-11 DIAGNOSIS — I4891 Unspecified atrial fibrillation: Secondary | ICD-10-CM

## 2018-11-11 DIAGNOSIS — I442 Atrioventricular block, complete: Secondary | ICD-10-CM

## 2018-11-11 DIAGNOSIS — I340 Nonrheumatic mitral (valve) insufficiency: Secondary | ICD-10-CM

## 2018-11-11 LAB — GLUCOSE, CAPILLARY
Glucose-Capillary: 123 mg/dL — ABNORMAL HIGH (ref 70–99)
Glucose-Capillary: 184 mg/dL — ABNORMAL HIGH (ref 70–99)
Glucose-Capillary: 180 mg/dL — ABNORMAL HIGH (ref 70–99)
Glucose-Capillary: 150 mg/dL — ABNORMAL HIGH (ref 70–99)
Glucose-Capillary: 150 mg/dL — ABNORMAL HIGH (ref 70–99)

## 2018-11-11 LAB — PHOSPHORUS: Phosphorus: 4.3 mg/dL (ref 2.5–4.6)

## 2018-11-11 LAB — CBC WITH DIFFERENTIAL/PLATELET
Abs Immature Granulocytes: 1.58 10*3/uL — ABNORMAL HIGH (ref 0.00–0.07)
Basophils Absolute: 0.1 10*3/uL (ref 0.0–0.1)
Basophils Relative: 1 %
Eosinophils Absolute: 0.1 10*3/uL (ref 0.0–0.5)
Eosinophils Relative: 1 %
HCT: 27 % — ABNORMAL LOW (ref 36.0–46.0)
Hemoglobin: 8.6 g/dL — ABNORMAL LOW (ref 12.0–15.0)
Immature Granulocytes: 8 %
Lymphocytes Relative: 14 %
Lymphs Abs: 2.8 10*3/uL (ref 0.7–4.0)
MCH: 27 pg (ref 26.0–34.0)
MCHC: 31.9 g/dL (ref 30.0–36.0)
MCV: 84.9 fL (ref 80.0–100.0)
Monocytes Absolute: 1.1 10*3/uL — ABNORMAL HIGH (ref 0.1–1.0)
Monocytes Relative: 5 %
Neutro Abs: 15.1 10*3/uL — ABNORMAL HIGH (ref 1.7–7.7)
Neutrophils Relative %: 71 %
Platelets: 222 10*3/uL (ref 150–400)
RBC: 3.18 MIL/uL — ABNORMAL LOW (ref 3.87–5.11)
RDW: 15.3 % (ref 11.5–15.5)
WBC: 20.8 10*3/uL — ABNORMAL HIGH (ref 4.0–10.5)
nRBC: 0.2 % (ref 0.0–0.2)

## 2018-11-11 LAB — URINE CULTURE: Culture: NO GROWTH

## 2018-11-11 LAB — COMPREHENSIVE METABOLIC PANEL
ALT: 47 U/L — ABNORMAL HIGH (ref 0–44)
AST: 52 U/L — ABNORMAL HIGH (ref 15–41)
Albumin: 2 g/dL — ABNORMAL LOW (ref 3.5–5.0)
Alkaline Phosphatase: 78 U/L (ref 38–126)
Anion gap: 11 (ref 5–15)
BUN: 102 mg/dL — ABNORMAL HIGH (ref 8–23)
CO2: 16 mmol/L — ABNORMAL LOW (ref 22–32)
Calcium: 8.5 mg/dL — ABNORMAL LOW (ref 8.9–10.3)
Chloride: 105 mmol/L (ref 98–111)
Creatinine, Ser: 3.29 mg/dL — ABNORMAL HIGH (ref 0.44–1.00)
GFR calc Af Amer: 15 mL/min — ABNORMAL LOW (ref >60)
GFR calc non Af Amer: 13 mL/min — ABNORMAL LOW (ref >60)
Glucose, Bld: 125 mg/dL — ABNORMAL HIGH (ref 70–99)
Potassium: 4.9 mmol/L (ref 3.5–5.1)
Sodium: 132 mmol/L — ABNORMAL LOW (ref 135–145)
Total Bilirubin: 0.7 mg/dL (ref 0.3–1.2)
Total Protein: 5.5 g/dL — ABNORMAL LOW (ref 6.5–8.1)

## 2018-11-11 LAB — ECHOCARDIOGRAM LIMITED
Height: 63 in
Weight: 3890.68 oz

## 2018-11-11 LAB — HEMOGLOBIN A1C
Hgb A1c MFr Bld: 8.4 % — ABNORMAL HIGH (ref 4.8–5.6)
Mean Plasma Glucose: 194.38 mg/dL

## 2018-11-11 LAB — MAGNESIUM: Magnesium: 2.5 mg/dL — ABNORMAL HIGH (ref 1.7–2.4)

## 2018-11-11 MED ORDER — TECHNETIUM TC 99M MEBROFENIN IV KIT
5.0100 | PACK | Freq: Once | INTRAVENOUS | Status: AC
  Administered 2018-11-11: 5.01 via INTRAVENOUS

## 2018-11-11 NOTE — Progress Notes (Signed)
Progress Note  Patient Name: Emily Hughes Date of Encounter: 11/11/2018  Primary Cardiologist: Dr Percival Spanish  Subjective   Overnight 3. Degree AVB - very short at 4:51 am, while asleep.  Inpatient Medications    Scheduled Meds: . allopurinol  300 mg Oral Daily  . aspirin EC  81 mg Oral Daily  . calcitRIOL  0.25 mcg Oral Daily  . Chlorhexidine Gluconate Cloth  6 each Topical Daily  . cloNIDine  0.1 mg Oral BID  . diltiazem  120 mg Oral Daily  . insulin aspart  0-15 Units Subcutaneous Q4H  . insulin aspart  6 Units Subcutaneous TID WC  . insulin glargine  15 Units Subcutaneous Daily  . insulin regular  0-10 Units Intravenous TID WC  . levothyroxine  50 mcg Oral Q0600  . mouth rinse  15 mL Mouth Rinse BID  . metoprolol succinate  50 mg Oral Daily  . pravastatin  80 mg Oral QHS  . sodium bicarbonate  650 mg Oral QID   Continuous Infusions: . ceFEPime (MAXIPIME) IV Stopped (11/10/18 1125)  . insulin Stopped (11/10/18 1950)  . metronidazole Stopped (11/11/18 0549)  . vancomycin Stopped (11/10/18 2237)   PRN Meds: acetaminophen **OR** acetaminophen, dextrose, HYDROcodone-acetaminophen, morphine injection, ondansetron **OR** ondansetron (ZOFRAN) IV   Vital Signs    Vitals:   11/11/18 0500 11/11/18 0600 11/11/18 0700 11/11/18 0800  BP: (!) 129/58 (!) 126/57 104/69   Pulse: 72 71 73   Resp: 16 16 17    Temp:    97.7 F (36.5 C)  TempSrc:    Oral  SpO2: 97% 97% 96%   Weight:      Height:        Intake/Output Summary (Last 24 hours) at 11/11/2018 0843 Last data filed at 11/11/2018 0600 Gross per 24 hour  Intake 2061.61 ml  Output 795 ml  Net 1266.61 ml   Last 3 Weights 11/10/2018 11/09/2018 11/08/2018  Weight (lbs) 243 lb 2.7 oz 246 lb 246 lb  Weight (kg) 110.3 kg 111.585 kg 111.585 kg     Telemetry    SR, short episode of 3.AVB- Personally Reviewed  ECG    No new tracing - Personally Reviewed  Physical Exam   GEN: No acute distress.   Neck: No JVD Cardiac:  RRR, no murmurs, rubs, or gallops.  Respiratory: Clear to auscultation bilaterally. GI: Soft, nontender, non-distended  MS: No edema; No deformity. Neuro:  Nonfocal  Psych: Normal affect   Labs    Chemistry Recent Labs  Lab 11/10/18 0233  11/10/18 1512 11/10/18 1821 11/10/18 2159 11/11/18 0323  NA 135   < > 129* 131* 131* 132*  K 4.9   < > 4.4 4.2 4.6 4.9  CL 108   < > 103 104 104 105  CO2 16*   < > 16* 16* 14* 16*  GLUCOSE 203*   < > 237* 122* 127* 125*  BUN 109*   < > 104* 86* 110* 102*  CREATININE 3.26*   < > 3.25* 3.19* 3.17* 3.29*  CALCIUM 8.7*   < > 8.1* 8.6* 8.2* 8.5*  PROT 5.6*  --  5.3*  --   --  5.5*  ALBUMIN 2.1*  --  2.0*  --   --  2.0*  AST 47*  --  65*  --   --  52*  ALT 40  --  49*  --   --  47*  ALKPHOS 79  --  73  --   --  78  BILITOT 0.5  --  0.4  --   --  0.7  GFRNONAA 13*   < > 13* 13* 14* 13*  GFRAA 15*   < > 15* 16* 16* 15*  ANIONGAP 11   < > 10 11 13 11    < > = values in this interval not displayed.    Hematology Recent Labs  Lab 11/09/18 0344 11/10/18 0233 11/11/18 0323  WBC 21.8* 18.9* 20.8*  RBC 3.58* 3.26* 3.18*  HGB 9.9* 8.7* 8.6*  HCT 30.0* 28.0* 27.0*  MCV 83.8 85.9 84.9  MCH 27.7 26.7 27.0  MCHC 33.0 31.1 31.9  RDW 14.8 15.2 15.3  PLT 249 251 222   Cardiac Enzymes Recent Labs  Lab 11/08/18 1856 11/08/18 2330 11/09/18 0554 11/09/18 1206  TROPONINI 0.09* 0.10* 0.08* 0.07*   No results for input(s): TROPIPOC in the last 168 hours.   BNPNo results for input(s): BNP, PROBNP in the last 168 hours.   DDimer No results for input(s): DDIMER in the last 168 hours.   Radiology    US Abdomen Limited Ruq  Result Date: 11/11/2018 CLINICAL DATA:  Abnormal LFTs EXAM: ULTRASOUND ABDOMEN LIMITED RIGHT UPPER QUADRANT COMPARISON:  None. FINDINGS: Gallbladder: No gallbladder wall thickening. Sludge within the gallbladder. Trace pericholecystic fluid. Positive sonographic Murphy's sign. Common bile duct: Diameter: Normal at 5 mm Liver: No  focal lesion identified. Within normal limits in parenchymal echogenicity. Portal vein is patent on color Doppler imaging with normal direction of blood flow towards the liver. IMPRESSION: 1. Gallbladder sludge and positive sonographic Murphy's sign. Recommend clinical correlation for ACUTE CHOLECYSTITIS. 2. No biliary duct dilatation. Electronically Signed   By: Emily Hughes M.D.   On: 11/11/2018 06:13    Cardiac Studies     Patient Profile     78 y.o. female   [place]    1.  Elevated troponin: -Pt presented to [facility] on 11/08/2018 with a 3-day history of gluteal pain with associated fevers (max-102) and chills found to have an indurated area of the left medial gluteal fold on ED presentation.  She underwent a CT scan of the pelvis which showed an abscess in the subcutaneous tissue of this area.  -Troponin levels were drawn and found to be mildly elevated at 0.09, 0.10 and 0.08 which are flat and not consistent with ACS and more consistent with demand ischemia in the settings of sepsis, elevated lactic acid, and acute on chronic kidney failure. No recent complaints of anginal symptoms.  -EKG initially with questionable a flutter versus sinus tach with PACs and repeat EKG from today with NSR and evidence of LVH, no evidence for ischemia -On home ASA 81 which is currently on hold secondary to the need for surgical intervention -Home Toprol 25, pravastatin resumed - no ischemic workup is needed  2. Atrial flutter with variable block - new, in the settings of sepsis - now in SR, she developed short episode of 3.AVB overnight while asleep - possible sec to AVN blocking drugs and sleep apnea, I will hold cardizem, we will continue to follow  - d/c  cardizem CD 120 mg po daily -no need for anticoagulation at this time given short duration and need for surgical intervention -Continue to monitor closely on telemetry for recurrence  3.  Left gluteal abscess with sepsis: -Patient  presented with a 3-day history of left gluteal pain with fevers and chills found to have a gluteal abscess per CT scan with plans for surgical intervention per  general surgery team once DKA has stabilized -post surgery yesterday  4.  DKA: -Glucose on presentation greater than 500 with initiation of insulin gtt. in the emergency department -More stable now -Patient reports not taking her SQ insulin for several days prior to admission -Also on glimepiride PO  -Once transition off glucose stabilizer, SSI for glucose control -May need diabetic counseling prior to discharge -Further management per primary team  5.  CKD stage IV: -Creatinine on presentation, 4.22 with mild improvement today at 3.48 -Patient reports following with nephrology in the outpatient setting -They have discussed the possible need for dialysis in the future however patient declines -Nephrology consult  5.  Hypertension: -Elevated, 145/53, 146/49, 142/39 -Likely in the setting of increased pain -On home Toprol 25, diltiazem 300, clonidine 0.1 mg twice daily -May need further medication titration however will hold off in the setting of acute illness and follow in the postsurgical setting  For questions or updates, please contact [facility] Please consult [email or web address] for contact info under     Signed, Ena Dawley, MD  11/11/2018, 8:43 AM

## 2018-11-11 NOTE — Progress Notes (Signed)
  Echocardiogram 2D Echocardiogram has been performed.  Emily Hughes 11/11/2018, 10:22 AM

## 2018-11-11 NOTE — Progress Notes (Signed)
PROGRESS NOTE    Emily Hughes  YIR:[id number] DOB: 01-22-1941 DOA: 11/08/2018 PCP: Susy Frizzle, MD   Brief Narrative:  HPI per Dr. Toy Baker on 11/08/2018  Patient is a 78 year old African-American female with a past medical history significant for but not limited to diabetes mellitus type 2, CKD stage IV, hypothyroidism, CAD, GERD, hypertension, hyperlipidemia, history of seizure disorder, ? Atrial fibrillation, long with comorbidities who presented to the emergency room with a 3-day history of pain and swelling in her buttocks along with a fever of 102.  Family reportedly gave her some Tylenol that helped her improve her temperature down to 99 but she is found to have elevated blood pressures for last few days when in the 400s.  She has not been taking her insulin for last 2 days and has not been checking her glucose regularly.  On admission she stated that the left buttock pain lasted for last few days and was severe 10 out of 10 and reportedly was a boil that then progressed.  She was worked up and admitted for sepsis secondary to abscess of the left buttock as well as DKA.  She was placed in the stepdown unit and was placed on glucose stabilizer and has improvement in her blood sugars.  She was incidentally noted to be in atrial fibrillation/A Flutter which then converted to NSR a few days ago.  Cardiology was consulted for preoperative clearance and patient was taken for incision and drainage of her abscess on her left buttock and she is POD2. DKA improved and she was transitioned to Long Acting Insulin even though Her CO2 is not >20 given that she has chronic Metabolic Acidosis 2/2 to her Renal Disease and is on Sodium Bicarbonate. She tolerated her Diet without issues but complained of some Abdominal Pain.   Liver function tests were elevated and right upper quadrant ultrasound was done and showed possible acute cholecystitis.  I discussed with general surgery Dr. Jens Som  who recommends obtaining a HIDA scan however HIDA scan with EF is not able to be done at this time and if EF is needed to be done will come will be on Tuesday per nursing. So will obtain a HIDA scan without the EF currently.  And this HIDA scan was normal hepatobiliary scan so Dr. Windle Guard was less inclined for acute cholecystitis and felt that her LFTs being elevated could be reactionary.  We will continue to monitor closely and antibiotics have been de-escalated to just IV cefepime Flagyl for now and will continue to de-escalate accordingly.  Echocardiogram was done today  Assessment & Plan:   Active Problems:   Essential hypertension   CAD (coronary artery disease)   GERD (gastroesophageal reflux disease)   DKA (diabetic ketoacidoses) (HCC)   Chronic kidney disease (CKD), stage IV (severe) (HCC)   Abscess of left buttock   Sepsis (HCC)   Atrial fibrillation with RVR (HCC)   Elevated troponin  Sepsis secondary to the abscess of the left buttock s/p I and D of Abscess POD 2, improving -Patient met sepsis criteria with a fever, leukocytosis, and tachycardia (Atrial Fibrillation).  Leukocytosis now trending down -This protocol initiated in the ED and patient was given 30 mL's per KG doses -Sepsis physiology is improving but white blood cell count bumped slightly -Lactic acid level is now improved -CT Pelvis scan showed "Edema and gas identified in the subcutaneous fat of the medial left gluteal region without associated abscess. Imaging features compatible with soft tissue infection/cellulitis/" -Patient  was placed on broad-spectrum antibiotics including vancomycin and IV cefepime as well as metronidazole to cover anaerobes. IV Vancomycin now discontinued  -Procalcitonin level was 6.89 and lactic acid trended down from 2.9 is now 1.0 -WBC went from 27.5 -> 21.8 -> 18.9 -> 20.8 -SARS-CoV-2 Negative  -General Surgery was consulted for further evaluation and patient was rehydrated and treated for  her medical complications -Since blood sugars are better controlled today patient will be going for surgery and cardiology is evaluated for preoperative clearance next -Continue monitor temperature curve and cultures; Blood Cx x2 showed NGTD at 2 Days still -Aerobic/Anaerobic Gram Stain showed: Gram Stain ABUNDANT WBC PRESENT,BOTH PMN AND MONONUCLEAR  ABUNDANT GRAM POSITIVE COCCI  ABUNDANT GRAM NEGATIVE RODS   Culture FEW ESCHERICHIA COLI   -E Coli was Pan-sensitive so will narrow Abx to just IV Cefepime and Flagyl  -MRSA negative  -Unclear why patient was placed on contact precautions however there is no evidence that the patient needs to be a contact precautions and will be discontinued -General Surgery Recommending BID Wet to dry Dressing Changes; Patient would not allow me to view wound again but after discussing with Dr. Windle Guard likely may need some more debridement later this week -Repeat CBC  In AM   DKA in the setting of Diabetes Mellitus Type 2 -Patient was placed on glucose stabilizer and rehydrated and other collections are being corrected -We will transition to home dose of insulin per protocol once anion gap closes and CO2 is greater than 202 however she has chronic Metabolic Acidosis and has been stable so will transition as she is tolerating her meals  -CO2 this Afternoon was 16 and AG was 11 -Continued glucose stabilizer and discontinue 1-2 Hours after transitioned to Long Acting  -CBGs are improving and currently ranging from 94-184 now so will continue on D5 1/2 NS until Insulin gtt is stopped  -On admission patient had a Beta-Hydroxybutyric acid of 2.29 -Continue antibiotics as above -Patient was likely in DKA secondary to infection from left buttock abscess -Transitioned to 15 units of Lantus Daily, Start on Moderate Novolog SSI q4h, and 6 units TIDwm -We will need diabetes education coordinator to follow and appreciate input  -HbA1c this AM was 8.4 -Continue to Monitor  CBG's carefully   Essential Hypertension -Resume home medications when able to;  -C/w Home Metoprolol XL 25 mg po Daily  -She was started on a Cardizem drip and p.o. diltiazem has now been discontinued but she converted to NSR and Cardizem gtt was stopped and She was started on Cardizem 120 mg po Daily; This was held due to Bradycardia  -We will continue Clonidine 0.1 mg p.o. twice daily -Need to monitor closely and adjust medication as accordingly -BP was 124/66  CAD -Currently holding off on Aspirin given surgical intervention needed and will resume in AM  -We will continue Metoprolol -Patient is currently chest pain-free -Cardiology is evaluating and feels no ischemic work-up is needed currently in recommend resuming home Toprol 25 mg p.o. daily along with pravastatin and aspirin after her procedures done  Elevated Troponin -Denies any chest pain and there is no ischemic changes on EKG -Likely in the setting of CKD and worsening renal clearance -Likely demand ischemia in the setting of sepsis -Continue to cycle cardiac enzymes have been trending down and went from 0.09 -> 0.10 -> 0.08 -> 0.07 -Echocardiogram has been ordered and pending to be done  AKI on CKD stage IV Metabolic Acidosis  -Patient presented with a  BUN/creatinine of 125/4.76 is now improving and is now stable at 102/3.29 -IVF now D/C'd  -Avoid Nephrotoxic medications, contrast dyes, as well as hypertension if possible -Continue with Calcitriol -Continue with sodium bicarb 650 mg p.o. 4 times daily -Continue monitor and trend renal function -There is been discussions for possible need for dialysis in the future however patient is continually declined -Repeat CMP in a.m.  Atrial fibrillation/flutter with RVR versus MAT; Cardiology feels this is atrial flutter with variable block s/p Conversion to NSR with transient 3rd Degree AVB -Cardiology was consulted feels this is new in the setting of sepsis -Echo has been  ordered along with TSH and cardiac enzymes are being cycled; Echo still isn't done  -Patient was started on a Cardizem drip and p.o. Cardizem was held (Takes 300 mg po Daily at home); Since she converted to NSR last night Cardizem gtt stopped and she was started on Cardizem CD 120 mg Daily but this was held due to mild Bradycardia and stopped due to AVB over night and Dr. Meda Coffee believes its from AVN Blocking drugs and Sleep Apnea -Patient will be restarted on metoprolol XL 25 mg p.o. daily -Cardiology feels there is no need for anticoagulation at this time given her short duration need for surgical intervention -We will continue monitor closely on telemetry for recurrence -Checked ECHOCardiogram and showed the left ventricle has hyperdynamic systolic function, with an ejection fraction of >65%. The cavity size was normal. There is moderate concentric left ventricular hypertrophy. Left ventricular diastolic Doppler parameters are indeterminate.  Hypothyroidism -Checked TSH and was 1.815 -Continue levothyroxine 50 mcg p.o. daily  Morbid Obesity -Estimated body mass index is 43.08 kg/m as calculated from the following:   Height as of this encounter: 5' 3"  (1.6 m).   Weight as of this encounter: 110.3 kg. -Weight Loss and Dietary Counseling given   History of Gout -Currently colchicine is being held but will resume allopurinol 300 mg p.o. daily  Abnormal LFT's, improving  -? Reactive or Medication Induced -AST went from 37 -> 47 -> 65 -> 52 -ALT went from 34 -> 40 -> 49 -> 47 -Check RUQ U/S and Acute Hepatitis Panel; Acute Hepatitis Panel Pending  -RUQ U/S Showed Gallbladder sludge and positive sonographic Murphy's sign.No biliary duct dilatation -Ordered HIDA and was it was normal -Discussed with General Surgery and they recommended no further intervention  -Continue to Monitor and Trend Hepatic Function -Repeat CMP in AM  Hyponatremia -Patient's Na+ is now 132 -IVF now D/C'd    -Continue to Monitor and Trend -Repeat CMP in AM   Normocytic Anemia/Anemia of Chronic Kidney Disease -Patient's Hb/Hct went from 11.4/35.0 -> 9.9/30.0 -> 8.7/28.0 -> 8.6/27.0 -Likely also has a Dilutional Drop and Post-operative drop -Check Anemia Panel in the AM -Continue to Monitor for S/Sx of Bleeding -Repeat CMP in AM   Hypermagnesemia -Mild at 2.5 -Continue to Monitor and Repeat Mag Level in AM   DVT prophylaxis: SCDs Code Status: FULL CODE Family Communication: No family present at bedside  Disposition Plan: Likely transfer to the Medical Floor with Telemetry in AM if stable  Consultants:   General Surgery  Cardiology   Procedures:  I and D of Left Buttock and Perinanal Abscess done by Dr. Ninfa Linden on 11/09/2018  ECHOCARDIOGRAM IMPRESSIONS    1. The left ventricle has hyperdynamic systolic function, with an ejection fraction of >65%. The cavity size was normal. There is moderate concentric left ventricular hypertrophy. Left ventricular diastolic Doppler parameters are indeterminate.  2. The right ventricle has normal systolic function. The cavity was normal. There is no increase in right ventricular wall thickness.  3. Left atrial size was mildly dilated.  4. The pericardial effusion is posterior to the left ventricle.  5. Trivial pericardial effusion is present.  6. The mitral valve is grossly normal. No evidence of mitral valve stenosis.  7. The tricuspid valve is grossly normal.  8. The aortic valve is tricuspid. Mild thickening of the aortic valve. No stenosis of the aortic valve.  FINDINGS  Left Ventricle: The left ventricle has hyperdynamic systolic function, with an ejection fraction of >65%. The cavity size was normal. There is moderate concentric left ventricular hypertrophy. Left ventricular diastolic Doppler parameters are  indeterminate.  Right Ventricle: The right ventricle has normal systolic function. The cavity was normal. There is no increase  in right ventricular wall thickness.  Left Atrium: Left atrial size was mildly dilated.  Right Atrium: Right atrial size was normal in size.  Interatrial Septum: No atrial level shunt detected by color flow Doppler.  Pericardium: Trivial pericardial effusion is present. The pericardial effusion is posterior to the left ventricle.  Mitral Valve: The mitral valve is grossly normal. Mitral valve regurgitation is mild by color flow Doppler. No evidence of mitral valve stenosis.  Tricuspid Valve: The tricuspid valve is grossly normal. Tricuspid valve regurgitation is mild by color flow Doppler.  Aortic Valve: The aortic valve is tricuspid Mild thickening of the aortic valve. Aortic valve regurgitation was not visualized by color flow Doppler. There is No stenosis of the aortic valve.  Pulmonic Valve: The pulmonic valve was not assessed. Pulmonic valve regurgitation was not assessed by color flow Doppler.  Venous: The inferior vena cava is normal in size with greater than 50% respiratory variability.    +--------------+-------++  LEFT VENTRICLE           +----------------+---------++ +--------------+-------++  Diastology                    PLAX 2D                  +----------------+---------++ +--------------+-------++  LV e' lateral:   4.13 cm/s    LVIDd:         5.28 cm   +----------------+---------++ +--------------+-------++  LV E/e' lateral: 11.4         LVIDs:         3.81 cm   +----------------+---------++ +--------------+-------++  LV e' medial:    3.70 cm/s    LV PW:         1.17 cm   +----------------+---------++ +--------------+-------++  LV E/e' medial:  12.7         LV IVS:        1.40 cm   +----------------+---------++ +--------------+-------++  LV SV:         72 ml     +--------------+-------++  LV SV Index:   31.56     +--------------+-------++                           +--------------+-------++  +-----------+-------++----------++  LEFT ATRIUM           Index        +-----------+-------++----------++  LA diam:    4.40 cm  2.09 cm/m   +-----------+-------++----------++    +-------------+-------++  AORTA                   +-------------+-------++  Ao  Root diam: 3.40 cm   +-------------+-------++  +--------------+----------++  MITRAL VALVE                +--------------+----------++  MV Area (PHT): 2.42 cm     +--------------+----------++  MV PHT:        91.06 msec   +--------------+----------++  MV Decel Time: 314 msec     +--------------+----------++ +--------------+----------++  MV E velocity: 47.10 cm/s   +--------------+----------++  MV A velocity: 62.10 cm/s   +--------------+----------++  MV E/A ratio:  0.76         +--------------+----------++   Antimicrobials:  Anti-infectives (From admission, onward)   Start     Dose/Rate Route Frequency Ordered Stop   11/10/18 2200  vancomycin (VANCOCIN) IVPB 750 mg/150 ml premix  Status:  Discontinued     750 mg 150 mL/hr over 60 Minutes Intravenous Every 48 hours 11/08/18 2126 11/11/18 1451   11/09/18 1000  ceFEPIme (MAXIPIME) 2 g in sodium chloride 0.9 % 100 mL IVPB     2 g 200 mL/hr over 30 Minutes Intravenous Every 24 hours 11/09/18 0901     11/08/18 2200  ceFEPIme (MAXIPIME) 2 g in sodium chloride 0.9 % 100 mL IVPB  Status:  Discontinued     2 g 200 mL/hr over 30 Minutes Intravenous Every 24 hours 11/08/18 2126 11/09/18 0901   11/08/18 2030  metroNIDAZOLE (FLAGYL) IVPB 500 mg     500 mg 100 mL/hr over 60 Minutes Intravenous Every 8 hours 11/08/18 2022     11/08/18 1645  vancomycin (VANCOCIN) 2,500 mg in sodium chloride 0.9 % 500 mL IVPB     2,500 mg 250 mL/hr over 120 Minutes Intravenous  Once 11/08/18 1616 11/08/18 1941   11/08/18 1615  vancomycin (VANCOCIN) IVPB 1000 mg/200 mL premix  Status:  Discontinued     1,000 mg 200 mL/hr over 60 Minutes Intravenous  Once 11/08/18 1600 11/08/18 1616   11/08/18 1615  cefTRIAXone (ROCEPHIN) 2 g in sodium chloride 0.9 % 100  mL IVPB     2 g 200 mL/hr over 30 Minutes Intravenous  Once 11/08/18 1600 11/08/18 1712     Subjective: Seen and examined at bedside and she was complaining of some intermittent abdominal pain.  No nausea or vomiting.  Still having some buttock.  Feeling a little bit better.  No other concerns or complaints at this time.  Objective: Vitals:   11/11/18 1100 11/11/18 1200 11/11/18 1300 11/11/18 1400  BP: (!) 121/52 (!) 127/57 (!) 98/42 124/66  Pulse:  73 66 66  Resp: 20 17 16 18   Temp:  97.6 F (36.4 C)    TempSrc:  Oral    SpO2:  100% 100% 100%  Weight:      Height:        Intake/Output Summary (Last 24 hours) at 11/11/2018 1519 Last data filed at 11/11/2018 0600 Gross per 24 hour  Intake 775.33 ml  Output 775 ml  Net 0.33 ml   Filed Weights   11/08/18 1450 11/09/18 1254 11/10/18 0500  Weight: 111.6 kg 111.6 kg 110.3 kg   Examination: Physical Exam:  Constitutional: Well-nourished, well-developed obese African-American female appears calm and slightly more comfortable than yesterday Eyes: Lids and conjunctive are normal.  Sclera anicteric ENMT: External ears nose appear normal.  Grossly normal hearing Neck: Appears supple no JVD Respiratory: Diminished auscultation bilaterally no appreciable wheezing, rales, car.  Patient not tachypneic or using any accessory muscles to breathe Cardiovascular: Regular rate and rhythm.  No appreciable  murmurs, rubs or gallops.  Has 1+ lower extremity edema Abdomen: Soft, mildly tender in the right upper quadrant.  Distended secondary body habitus.  Bowel sounds present GU: Deferred but had a Foley catheter in and had about 500 mL in bag Musculoskeletal: No contractures or cyanosis.  No joint deformities Skin: Has a buttocks wound that she still does not want me to view because she did not want to turn because of pain.  No appreciable rashes or lesions limited skin evaluation Neurologic: Cranial nerves II through XII gross intact no appreciable  focal deficits Psychiatric: Slightly anxious but normal mood and affect.  She is awake, alert, oriented  Data Reviewed: I have personally reviewed following labs and imaging studies  CBC: Recent Labs  Lab 11/08/18 1533 11/09/18 0344 11/10/18 0233 11/11/18 0323  WBC 27.5* 21.8* 18.9* 20.8*  NEUTROABS 24.5*  --  16.4* 15.1*  HGB 11.4* 9.9* 8.7* 8.6*  HCT 35.0* 30.0* 28.0* 27.0*  MCV 83.5 83.8 85.9 84.9  PLT 285 249 251 397   Basic Metabolic Panel: Recent Labs  Lab 11/09/18 0344  11/10/18 0233  11/10/18 1440 11/10/18 1512 11/10/18 1821 11/10/18 2159 11/11/18 0323  NA 136   136   < > 135   < > 131* 129* 131* 131* 132*  K 4.7   4.7   < > 4.9   < > 4.5 4.4 4.2 4.6 4.9  CL 110   109   < > 108   < > 105 103 104 104 105  CO2 17*   16*   < > 16*   < > 17* 16* 16* 14* 16*  GLUCOSE 119*   118*   < > 203*   < > 241* 237* 122* 127* 125*  BUN 105*   105*   < > 109*   < > 104* 104* 86* 110* 102*  CREATININE 3.48*   3.54*   < > 3.26*   < > 3.28* 3.25* 3.19* 3.17* 3.29*  CALCIUM 8.9   8.8*   < > 8.7*   < > 8.4* 8.1* 8.6* 8.2* 8.5*  MG 2.3  --  2.4  --   --   --   --   --  2.5*  PHOS 2.7  --  4.1  --   --   --   --   --  4.3   < > = values in this interval not displayed.   GFR: Estimated Creatinine Clearance: 17.4 mL/min (A) (by C-G formula based on SCr of 3.29 mg/dL (H)). Liver Function Tests: Recent Labs  Lab 11/08/18 1533 11/09/18 0344 11/10/18 0233 11/10/18 1512 11/11/18 0323  AST 31 37 47* 65* 52*  ALT 32 34 40 49* 47*  ALKPHOS 113 82 79 73 78  BILITOT 0.7 0.4 0.5 0.4 0.7  PROT 7.8 6.0* 5.6* 5.3* 5.5*  ALBUMIN 3.2* 2.5* 2.1* 2.0* 2.0*   No results for input(s): LIPASE, AMYLASE in the last 168 hours. No results for input(s): AMMONIA in the last 168 hours. Coagulation Profile: Recent Labs  Lab 11/08/18 2037  INR 1.3*   Cardiac Enzymes: Recent Labs  Lab 11/08/18 1856 11/08/18 2330 11/09/18 0554 11/09/18 1206  TROPONINI 0.09* 0.10* 0.08* 0.07*   BNP (last 3  results) No results for input(s): PROBNP in the last 8760 hours. HbA1C: Recent Labs    11/11/18 0323  HGBA1C 8.4*   CBG: Recent Labs  Lab 11/10/18 1924 11/10/18 2312 11/11/18 0335 11/11/18 0739 11/11/18 1150  GLUCAP 94  128* 123* 184* 180*   Lipid Profile: No results for input(s): CHOL, HDL, LDLCALC, TRIG, CHOLHDL, LDLDIRECT in the last 72 hours. Thyroid Function Tests: Recent Labs    11/09/18 0344  TSH 1.815   Anemia Panel: No results for input(s): VITAMINB12, FOLATE, FERRITIN, TIBC, IRON, RETICCTPCT in the last 72 hours. Sepsis Labs: Recent Labs  Lab 11/08/18 1856 11/08/18 2037 11/08/18 2330 11/09/18 0554  PROCALCITON  --  6.89  --   --   LATICACIDVEN 1.7 2.0* 2.9* 1.0    Recent Results (from the past 240 hour(s))  Blood Culture (routine x 2)     Status: None (Preliminary result)   Collection Time: 11/08/18  3:33 PM  Result Value Ref Range Status   Specimen Description   Final    BLOOD RIGHT ANTECUBITAL Performed at [facility], [place] [address], [place], [place] [address]    Special Requests   Final    BOTTLES DRAWN AEROBIC AND ANAEROBIC Blood Culture results may not be optimal due to an excessive volume of blood received in culture bottles Performed at [facility] [address] [place], [place] [address]    Culture   Final    NO GROWTH 2 DAYS Performed at [facility] [address] [place], [place] [address]    Report Status PENDING  Incomplete  Blood Culture (routine x 2)     Status: None (Preliminary result)   Collection Time: 11/08/18  3:41 PM  Result Value Ref Range Status   Specimen Description   Final    BLOOD LEFT ANTECUBITAL Performed at [facility] [address] [place], [place] [address]    Special Requests   Final    BOTTLES DRAWN AEROBIC AND ANAEROBIC Blood Culture results may not be optimal due to an excessive volume of blood received in culture bottles Performed  at [facility] [address] [place], [place] [address]    Culture   Final    NO GROWTH 2 DAYS Performed at [facility] [address] [place], [place] [address]    Report Status PENDING  Incomplete  SARS Coronavirus 2 (CEPHEID- Performed in [facility] hospital lab), Hosp Order     Status: None   Collection Time: 11/08/18  4:07 PM  Result Value Ref Range Status   SARS Coronavirus 2 NEGATIVE NEGATIVE Final    Comment: (NOTE) If result is NEGATIVE SARS-CoV-2 target nucleic acids are NOT DETECTED. The SARS-CoV-2 RNA is generally detectable in upper and lower  respiratory specimens during the acute phase of infection. The lowest  concentration of SARS-CoV-2 viral copies this assay can detect is 250  copies / mL. A negative result does not preclude SARS-CoV-2 infection  and should not be used as the sole basis for treatment or other  patient management decisions.  A negative result may occur with  improper specimen collection / handling, submission of specimen other  than nasopharyngeal swab, presence of viral mutation(s) within the  areas targeted by this assay, and inadequate number of viral copies  (<250 copies / mL). A negative result must be combined with clinical  observations, patient history, and epidemiological information. If result is POSITIVE SARS-CoV-2 target nucleic acids are DETECTED. The SARS-CoV-2 RNA is generally detectable in upper and lower  respiratory specimens dur ing the acute phase of infection.  Positive  results are indicative of active infection with SARS-CoV-2.  Clinical  correlation with patient history and other diagnostic  information is  necessary to determine patient infection status.  Positive results do  not rule out bacterial infection or co-infection with other viruses. If result is PRESUMPTIVE POSTIVE SARS-CoV-2 nucleic acids MAY BE PRESENT.   A presumptive positive result was obtained on the submitted specimen    and confirmed on repeat testing.  While 2019 novel coronavirus  (SARS-CoV-2) nucleic acids may be present in the submitted sample  additional confirmatory testing may be necessary for epidemiological  and / or clinical management purposes  to differentiate between  SARS-CoV-2 and other Sarbecovirus currently known to infect humans.  If clinically indicated additional testing with an alternate test  methodology [id number]) is advised. The SARS-CoV-2 RNA is generally  detectable in upper and lower respiratory sp ecimens during the acute  phase of infection. The expected result is Negative. Fact Sheet for Patients:  StrictlyIdeas.no Fact Sheet for Healthcare Providers: BankingDealers.co.za This test is not yet approved or cleared by the [place] FDA and has been authorized for detection and/or diagnosis of SARS-CoV-2 by FDA under an Emergency Use Authorization (EUA).  This EUA will remain in effect (meaning this test can be used) for the duration of the COVID-19 declaration under Section 564(b)(1) of the Act, 21 U.S.C. section 360bbb-3(b)(1), unless the authorization is terminated or revoked sooner. Performed at [facility], [place] [address], [place], [place] [address]   MRSA PCR Screening     Status: None   Collection Time: 11/08/18  8:54 PM  Result Value Ref Range Status   MRSA by PCR NEGATIVE NEGATIVE Final    Comment:        The GeneXpert MRSA Assay (FDA approved for NASAL specimens only), is one component of a comprehensive MRSA colonization surveillance program. It is not intended to diagnose MRSA infection nor to guide or monitor treatment for MRSA infections. Performed at [facility], [place] [address] [place], [place] [address]   Aerobic/Anaerobic Culture (surgical/deep wound)     Status: None (Preliminary result)   Collection Time: 11/09/18  2:06 PM  Result Value Ref Range Status    Specimen Description   Final    ABSCESS LEFT BUTTOCKS Performed at [facility] [address], [place], [place] [address]    Special Requests   Final    NONE Performed at [facility], [place] [address] [place], [place] [address]    Gram Stain   Final    ABUNDANT WBC PRESENT,BOTH PMN AND MONONUCLEAR ABUNDANT GRAM POSITIVE COCCI ABUNDANT GRAM NEGATIVE RODS    Culture   Final    FEW ESCHERICHIA COLI HOLDING FOR POSSIBLE ANAEROBE Performed at [facility], [place] [address], [place], [place] [address]    Report Status PENDING  Incomplete   Organism ID, Bacteria ESCHERICHIA COLI  Final      Susceptibility   Escherichia coli - MIC*    AMPICILLIN <=2 SENSITIVE Sensitive     CEFAZOLIN <=4 SENSITIVE Sensitive     CEFEPIME <=1 SENSITIVE Sensitive     CEFTAZIDIME <=1 SENSITIVE Sensitive     CEFTRIAXONE <=1 SENSITIVE Sensitive     CIPROFLOXACIN <=0.25 SENSITIVE Sensitive     GENTAMICIN <=1 SENSITIVE Sensitive     IMIPENEM <=0.25 SENSITIVE Sensitive     TRIMETH/SULFA <=20 SENSITIVE Sensitive     AMPICILLIN/SULBACTAM <=2 SENSITIVE Sensitive     PIP/TAZO <=4 SENSITIVE Sensitive     Extended ESBL NEGATIVE Sensitive     * FEW ESCHERICHIA COLI  Radiology Studies: Nm Hepatobiliary Liver Func  Result Date: 11/11/2018 CLINICAL DATA:  Right upper quadrant pain with fever and elevated white count. Abnormal gallbladder ultrasound EXAM: NUCLEAR MEDICINE HEPATOBILIARY IMAGING TECHNIQUE: Sequential images of the abdomen were obtained out to 60 minutes following intravenous administration of radiopharmaceutical. RADIOPHARMACEUTICALS:  5.0 mCi Tc-81m Choletec IV COMPARISON:  11/11/2018 FINDINGS: Prompt uptake and biliary excretion of activity by the liver is seen. Gallbladder activity is visualized, consistent with patency of cystic duct. Biliary activity passes into small bowel, consistent with patent common bile duct. IMPRESSION: Normal hepatobiliary scan  Electronically Signed   By: MJerilynn Mages  Shick M.D.   On: 11/11/2018 14:05   UKoreaAbdomen Limited Ruq  Result Date: 11/11/2018 CLINICAL DATA:  Abnormal LFTs EXAM: ULTRASOUND ABDOMEN LIMITED RIGHT UPPER QUADRANT COMPARISON:  None. FINDINGS: Gallbladder: No gallbladder wall thickening. Sludge within the gallbladder. Trace pericholecystic fluid. Positive sonographic Murphy's sign. Common bile duct: Diameter: Normal at 5 mm Liver: No focal lesion identified. Within normal limits in parenchymal echogenicity. Portal vein is patent on color Doppler imaging with normal direction of blood flow towards the liver. IMPRESSION: 1. Gallbladder sludge and positive sonographic Murphy's sign. Recommend clinical correlation for ACUTE CHOLECYSTITIS. 2. No biliary duct dilatation. Electronically Signed   By: SSuzy BouchardM.D.   On: 11/11/2018 06:13   Scheduled Meds:  allopurinol  300 mg Oral Daily   aspirin EC  81 mg Oral Daily   calcitRIOL  0.25 mcg Oral Daily   Chlorhexidine Gluconate Cloth  6 each Topical Daily   cloNIDine  0.1 mg Oral BID   insulin aspart  0-15 Units Subcutaneous Q4H   insulin aspart  6 Units Subcutaneous TID WC   insulin glargine  15 Units Subcutaneous Daily   insulin regular  0-10 Units Intravenous TID WC   levothyroxine  50 mcg Oral Q0600   mouth rinse  15 mL Mouth Rinse BID   metoprolol succinate  50 mg Oral Daily   pravastatin  80 mg Oral QHS   sodium bicarbonate  650 mg Oral QID   Continuous Infusions:  ceFEPime (MAXIPIME) IV Stopped (11/11/18 1114)   insulin Stopped (11/10/18 1950)   metronidazole Stopped (11/11/18 1303)    LOS: 3 days   OKerney Elbe DO [facility] PAGER is on AMION  If 7PM-7AM, please contact night-coverage [email or web address] Password [email or web address] 11/11/2018, 3:19 PM

## 2018-11-11 NOTE — Progress Notes (Signed)
Pt HR dropped to 38.  Pt not symptomatic and episode only lasted a few seconds. Pt stable and HR is now 80. Notified provider and will continue to monitor.

## 2018-11-11 NOTE — Progress Notes (Signed)
Pharmacy Antibiotic Note  Emily Hughes is a 78 y.o. female admitted on 11/08/2018 with cellulitis/abscess of buttocks.  Pharmacy has been consulted for Vancomycin & Cefepime dosing.  11/11/2018: POD#2 I&D gluteal abscess  Afebrile  WBC remains elevated at 20.8  Stage IV CKD > SCr 3.29 ~ stable SCr 3.29 AUC 473 Cmax 27.3 Cmin 13.9 w/ vancomycin 750 q48 Vd 0.5 Abscess culture with pan sensitive E coli.  Plan: -continue Cefepime 2gm IV q24h -consider stopping vancomycin -on Vancomycin 750mg  IV q48h - on Flagyl 500 mg IV q8h per MD -Monitor renal function and cx data   Height: 5\' 3"  (160 cm) Weight: 243 lb 2.7 oz (110.3 kg) IBW/kg (Calculated) : 52.4  Temp (24hrs), Avg:98.2 F (36.8 C), Min:97.7 F (36.5 C), Max:98.5 F (36.9 C)  Recent Labs  Lab 11/08/18 1533 11/08/18 1856 11/08/18 2037 11/08/18 2330 11/09/18 0344 11/09/18 0554  11/10/18 0233  11/10/18 1440 11/10/18 1512 11/10/18 1821 11/10/18 2159 11/11/18 0323  WBC 27.5*  --   --   --  21.8*  --   --  18.9*  --   --   --   --   --  20.8*  CREATININE 4.76*  --  4.22* 3.90* 3.48*  3.54* 3.43*   < > 3.26*   < > 3.28* 3.25* 3.19* 3.17* 3.29*  LATICACIDVEN 2.8* 1.7 2.0* 2.9*  --  1.0  --   --   --   --   --   --   --   --    < > = values in this interval not displayed.    Estimated Creatinine Clearance: 17.4 mL/min (A) (by C-G formula based on SCr of 3.29 mg/dL (H)).    Allergies  Allergen Reactions  . Ace Inhibitors   . Actos [Pioglitazone Hydrochloride]   . Dust Mite Extract   . Dye Fdc Red [Erythrosine Red No. 3]    Antimicrobials this admission:  5/6 vanc>> 5/6 CTX x 1 5/7 flagyl>> 5/7 cefepime>>  Dose adjustments this admission:   Microbiology results:  5/6 MRSA PCR neg 5/6 SARS Cov2: neg 5/6 BCx2> ngtd 5/7 abscess L buttocks: pan sensitive E coli 5/8 Ucx>sent  Thank you for allowing pharmacy to be a part of this patient's care.  Eudelia Bunch, Pharm.D 11/11/2018 11:05 AM

## 2018-11-12 ENCOUNTER — Inpatient Hospital Stay (HOSPITAL_COMMUNITY): Payer: PPO

## 2018-11-12 DIAGNOSIS — R400 Somnolence: Secondary | ICD-10-CM

## 2018-11-12 DIAGNOSIS — R945 Abnormal results of liver function studies: Secondary | ICD-10-CM

## 2018-11-12 DIAGNOSIS — R5383 Other fatigue: Secondary | ICD-10-CM

## 2018-11-12 DIAGNOSIS — R410 Disorientation, unspecified: Secondary | ICD-10-CM

## 2018-11-12 DIAGNOSIS — R7989 Other specified abnormal findings of blood chemistry: Secondary | ICD-10-CM

## 2018-11-12 LAB — URINALYSIS, ROUTINE W REFLEX MICROSCOPIC
Bilirubin Urine: NEGATIVE
Glucose, UA: NEGATIVE mg/dL
Ketones, ur: 5 mg/dL — AB
Nitrite: NEGATIVE
Protein, ur: NEGATIVE mg/dL
Specific Gravity, Urine: 1.011 (ref 1.005–1.030)
pH: 5 (ref 5.0–8.0)

## 2018-11-12 LAB — CBC WITH DIFFERENTIAL/PLATELET
Abs Immature Granulocytes: 1.2 10*3/uL — ABNORMAL HIGH (ref 0.00–0.07)
Band Neutrophils: 2 %
Basophils Absolute: 0 10*3/uL (ref 0.0–0.1)
Basophils Relative: 0 %
Eosinophils Absolute: 0 10*3/uL (ref 0.0–0.5)
Eosinophils Relative: 0 %
HCT: 26.2 % — ABNORMAL LOW (ref 36.0–46.0)
Hemoglobin: 8.4 g/dL — ABNORMAL LOW (ref 12.0–15.0)
Lymphocytes Relative: 9 %
Lymphs Abs: 2.1 10*3/uL (ref 0.7–4.0)
MCH: 26.7 pg (ref 26.0–34.0)
MCHC: 32.1 g/dL (ref 30.0–36.0)
MCV: 83.2 fL (ref 80.0–100.0)
Metamyelocytes Relative: 2 %
Monocytes Absolute: 0.7 10*3/uL (ref 0.1–1.0)
Monocytes Relative: 3 %
Myelocytes: 2 %
Neutro Abs: 19.3 10*3/uL — ABNORMAL HIGH (ref 1.7–7.7)
Neutrophils Relative %: 81 %
Platelets: 261 10*3/uL (ref 150–400)
Promyelocytes Relative: 1 %
RBC: 3.15 MIL/uL — ABNORMAL LOW (ref 3.87–5.11)
RDW: 15.4 % (ref 11.5–15.5)
WBC: 23.2 10*3/uL — ABNORMAL HIGH (ref 4.0–10.5)
nRBC: 0.2 % (ref 0.0–0.2)

## 2018-11-12 LAB — GLUCOSE, CAPILLARY
Glucose-Capillary: 124 mg/dL — ABNORMAL HIGH (ref 70–99)
Glucose-Capillary: 137 mg/dL — ABNORMAL HIGH (ref 70–99)
Glucose-Capillary: 143 mg/dL — ABNORMAL HIGH (ref 70–99)
Glucose-Capillary: 157 mg/dL — ABNORMAL HIGH (ref 70–99)
Glucose-Capillary: 157 mg/dL — ABNORMAL HIGH (ref 70–99)
Glucose-Capillary: 184 mg/dL — ABNORMAL HIGH (ref 70–99)
Glucose-Capillary: 196 mg/dL — ABNORMAL HIGH (ref 70–99)

## 2018-11-12 LAB — COMPREHENSIVE METABOLIC PANEL
ALT: 34 U/L (ref 0–44)
AST: 32 U/L (ref 15–41)
Albumin: 2.2 g/dL — ABNORMAL LOW (ref 3.5–5.0)
Alkaline Phosphatase: 85 U/L (ref 38–126)
Anion gap: 12 (ref 5–15)
BUN: 99 mg/dL — ABNORMAL HIGH (ref 8–23)
CO2: 17 mmol/L — ABNORMAL LOW (ref 22–32)
Calcium: 8.5 mg/dL — ABNORMAL LOW (ref 8.9–10.3)
Chloride: 104 mmol/L (ref 98–111)
Creatinine, Ser: 3.15 mg/dL — ABNORMAL HIGH (ref 0.44–1.00)
GFR calc Af Amer: 16 mL/min — ABNORMAL LOW (ref >60)
GFR calc non Af Amer: 14 mL/min — ABNORMAL LOW (ref >60)
Glucose, Bld: 173 mg/dL — ABNORMAL HIGH (ref 70–99)
Potassium: 4.8 mmol/L (ref 3.5–5.1)
Sodium: 133 mmol/L — ABNORMAL LOW (ref 135–145)
Total Bilirubin: 0.8 mg/dL (ref 0.3–1.2)
Total Protein: 6.1 g/dL — ABNORMAL LOW (ref 6.5–8.1)

## 2018-11-12 LAB — BLOOD GAS, ARTERIAL
Acid-base deficit: 8 mmol/L — ABNORMAL HIGH (ref 0.0–2.0)
Bicarbonate: 15.7 mmol/L — ABNORMAL LOW (ref 20.0–28.0)
Drawn by: 441261
O2 Content: 2 L/min
O2 Saturation: 95.8 %
Patient temperature: 37
pCO2 arterial: 26.9 mmHg — ABNORMAL LOW (ref 32.0–48.0)
pH, Arterial: 7.383 (ref 7.350–7.450)
pO2, Arterial: 77.6 mmHg — ABNORMAL LOW (ref 83.0–108.0)

## 2018-11-12 LAB — MAGNESIUM: Magnesium: 2.5 mg/dL — ABNORMAL HIGH (ref 1.7–2.4)

## 2018-11-12 LAB — VITAMIN B12: Vitamin B-12: 510 pg/mL (ref 180–914)

## 2018-11-12 LAB — PROCALCITONIN: Procalcitonin: 6.13 ng/mL

## 2018-11-12 LAB — PHOSPHORUS: Phosphorus: 4.5 mg/dL (ref 2.5–4.6)

## 2018-11-12 LAB — AMMONIA: Ammonia: 18 umol/L (ref 9–35)

## 2018-11-12 MED ORDER — MORPHINE SULFATE (PF) 4 MG/ML IV SOLN: 4.0000 mg | INTRAVENOUS | Status: DC | PRN

## 2018-11-12 MED ORDER — MORPHINE SULFATE (PF) 2 MG/ML IV SOLN
1.0000 mg | INTRAVENOUS | Status: DC | PRN
  Administered 2018-11-13: 1 mg via INTRAVENOUS
  Filled 2018-11-12: qty 1

## 2018-11-12 MED ORDER — AMLODIPINE BESYLATE 5 MG PO TABS
2.5000 mg | ORAL_TABLET | Freq: Every day | ORAL | Status: DC
  Administered 2018-11-12 – 2018-11-15 (×4): 2.5 mg via ORAL
  Filled 2018-11-12 (×4): qty 1

## 2018-11-12 NOTE — Progress Notes (Signed)
Progress Note  Patient Name: Myca Perno Date of Encounter: 11/12/2018  Primary Cardiologist: Dr Percival Spanish  Subjective   The patient is lethargic,   Inpatient Medications    Scheduled Meds:  allopurinol  300 mg Oral Daily   aspirin EC  81 mg Oral Daily   calcitRIOL  0.25 mcg Oral Daily   Chlorhexidine Gluconate Cloth  6 each Topical Daily   cloNIDine  0.1 mg Oral BID   insulin aspart  0-15 Units Subcutaneous Q4H   insulin aspart  6 Units Subcutaneous TID WC   insulin glargine  15 Units Subcutaneous Daily   insulin regular  0-10 Units Intravenous TID WC   levothyroxine  50 mcg Oral Q0600   mouth rinse  15 mL Mouth Rinse BID   metoprolol succinate  50 mg Oral Daily   pravastatin  80 mg Oral QHS   sodium bicarbonate  650 mg Oral QID   Continuous Infusions:  ceFEPime (MAXIPIME) IV 2 g (11/12/18 1006)   metronidazole Stopped (11/12/18 0532)   PRN Meds: acetaminophen **OR** acetaminophen, dextrose, HYDROcodone-acetaminophen, morphine injection, ondansetron **OR** ondansetron (ZOFRAN) IV   Vital Signs    Vitals:   11/12/18 0500 11/12/18 0600 11/12/18 0700 11/12/18 0800  BP: 134/66 139/60 (!) 156/52 (!) 127/51  Pulse: 79 84 84 87  Resp: 17 19 13 16   Temp:    98.3 F (36.8 C)  TempSrc:    Oral  SpO2: 95% 100% 97% 96%  Weight:      Height:        Intake/Output Summary (Last 24 hours) at 11/12/2018 1008 Last data filed at 11/12/2018 0603 Gross per 24 hour  Intake 384.37 ml  Output 1300 ml  Net -915.63 ml   Last 3 Weights 11/10/2018 11/09/2018 11/08/2018  Weight (lbs) 243 lb 2.7 oz 246 lb 246 lb  Weight (kg) 110.3 kg 111.585 kg 111.585 kg     Telemetry    SR, short episode of 3.AVB- Personally Reviewed  ECG    No new tracing - Personally Reviewed  Physical Exam   GEN: No acute distress.   Neck: No JVD Cardiac: RRR, no murmurs, rubs, or gallops.  Respiratory: mild crackles at bases to auscultation bilaterally. GI: Soft, nontender,  non-distended  MS: No edema; No deformity. Neuro:  Nonfocal  Psych: Normal affect   Labs    Chemistry Recent Labs  Lab 11/10/18 1512  11/10/18 2159 11/11/18 0323 11/12/18 0700  NA 129*   < > 131* 132* 133*  K 4.4   < > 4.6 4.9 4.8  CL 103   < > 104 105 104  CO2 16*   < > 14* 16* 17*  GLUCOSE 237*   < > 127* 125* 173*  BUN 104*   < > 110* 102* 99*  CREATININE 3.25*   < > 3.17* 3.29* 3.15*  CALCIUM 8.1*   < > 8.2* 8.5* 8.5*  PROT 5.3*  --   --  5.5* 6.1*  ALBUMIN 2.0*  --   --  2.0* 2.2*  AST 65*  --   --  52* 32  ALT 49*  --   --  47* 34  ALKPHOS 73  --   --  78 85  BILITOT 0.4  --   --  0.7 0.8  GFRNONAA 13*   < > 14* 13* 14*  GFRAA 15*   < > 16* 15* 16*  ANIONGAP 10   < > 13 11 12    < > = values  in this interval not displayed.    Hematology Recent Labs  Lab 11/10/18 0233 11/11/18 0323 11/12/18 0700  WBC 18.9* 20.8* 23.2*  RBC 3.26* 3.18* 3.15*  HGB 8.7* 8.6* 8.4*  HCT 28.0* 27.0* 26.2*  MCV 85.9 84.9 83.2  MCH 26.7 27.0 26.7  MCHC 31.1 31.9 32.1  RDW 15.2 15.3 15.4  PLT 251 222 261   Cardiac Enzymes Recent Labs  Lab 11/08/18 1856 11/08/18 2330 11/09/18 0554 11/09/18 1206  TROPONINI 0.09* 0.10* 0.08* 0.07*   No results for input(s): TROPIPOC in the last 168 hours.   BNPNo results for input(s): BNP, PROBNP in the last 168 hours.   DDimer No results for input(s): DDIMER in the last 168 hours.   Radiology    Nm Hepatobiliary Liver Func  Result Date: 11/11/2018 CLINICAL DATA:  Right upper quadrant pain with fever and elevated white count. Abnormal gallbladder ultrasound EXAM: NUCLEAR MEDICINE HEPATOBILIARY IMAGING TECHNIQUE: Sequential images of the abdomen were obtained out to 60 minutes following intravenous administration of radiopharmaceutical. RADIOPHARMACEUTICALS:  5.0 mCi Tc-28m  Choletec IV COMPARISON:  11/11/2018 FINDINGS: Prompt uptake and biliary excretion of activity by the liver is seen. Gallbladder activity is visualized, consistent with  patency of cystic duct. Biliary activity passes into small bowel, consistent with patent common bile duct. IMPRESSION: Normal hepatobiliary scan Electronically Signed   By: Jerilynn Mages.  Shick M.D.   On: 11/11/2018 14:05   US Abdomen Limited Ruq  Result Date: 11/11/2018 CLINICAL DATA:  Abnormal LFTs EXAM: ULTRASOUND ABDOMEN LIMITED RIGHT UPPER QUADRANT COMPARISON:  None. FINDINGS: Gallbladder: No gallbladder wall thickening. Sludge within the gallbladder. Trace pericholecystic fluid. Positive sonographic Murphy's sign. Common bile duct: Diameter: Normal at 5 mm Liver: No focal lesion identified. Within normal limits in parenchymal echogenicity. Portal vein is patent on color Doppler imaging with normal direction of blood flow towards the liver. IMPRESSION: 1. Gallbladder sludge and positive sonographic Murphy's sign. Recommend clinical correlation for ACUTE CHOLECYSTITIS. 2. No biliary duct dilatation. Electronically Signed   By: Suzy Bouchard M.D.   On: 11/11/2018 06:13    Cardiac Studies     Patient Profile     78 y.o. female   [place]    1.  Elevated troponin: -Pt presented to [facility] on 11/08/2018 with a 3-day history of gluteal pain with associated fevers (max-102) and chills found to have an indurated area of the left medial gluteal fold on ED presentation.  She underwent a CT scan of the pelvis which showed an abscess in the subcutaneous tissue of this area.  -Troponin levels were drawn and found to be mildly elevated at 0.09, 0.10 and 0.08 which are flat and not consistent with ACS and more consistent with demand ischemia in the settings of sepsis, elevated lactic acid, and acute on chronic kidney failure. No recent complaints of anginal symptoms.  -EKG initially with questionable a flutter versus sinus tach with PACs and repeat EKG from today with NSR and evidence of LVH, no evidence for ischemia -On home ASA 81 which is currently on hold secondary to the need for surgical  intervention -Home Toprol 25, pravastatin resumed - no ischemic workup is needed  2. Atrial flutter with variable block - new, in the settings of sepsis - now in SR,because of short episode of 3.AVB overnight while asleep - possible sec to AVN blocking drugs and sleep apnea, hold cardizem  - d/c  cardizem CD 120 mg po daily -no need for anticoagulation at this time given short duration  and need for surgical intervention -Continue to monitor closely on telemetry for recurrence  3.  Left gluteal abscess with sepsis: -Patient presented with a 3-day history of left gluteal pain with fevers and chills found to have a gluteal abscess per CT scan with plans for surgical intervention per general surgery team once DKA has stabilized -post surgery yesterday  4.  DKA: -Glucose on presentation greater than 500 with initiation of insulin gtt. in the emergency department -More stable now -Patient reports not taking her SQ insulin for several days prior to admission -Also on glimepiride PO  -Once transition off glucose stabilizer, SSI for glucose control -May need diabetic counseling prior to discharge -Further management per primary team  5.  CKD stage IV: -Creatinine on presentation, 4.22 with mild improvement today at 3.48 -Patient reports following with nephrology in the outpatient setting -They have discussed the possible need for dialysis in the future however patient declines -Nephrology consult  5.  Hypertension: -Elevated, -On toprol 50 mg po daily, clonidine 0.1 mg twice daily -add amlodipine 2.5 mg po daily  For questions or updates, please contact [place] Please consult [email or web address] for contact info under     Signed, Ena Dawley, MD  11/12/2018, 10:08 AM

## 2018-11-12 NOTE — Progress Notes (Signed)
PROGRESS NOTE    Emily Hughes  AOZ:[id number] DOB: [birth year] DOA: 11/08/2018 PCP: Susy Frizzle, MD   Brief Narrative:  HPI per Dr. Toy Baker on 11/08/2018  Patient is a 78 year old African-American female with a past medical history significant for but not limited to diabetes mellitus type 2, CKD stage IV, hypothyroidism, CAD, GERD, hypertension, hyperlipidemia, history of seizure disorder, ? Atrial fibrillation, long with comorbidities who presented to the emergency room with a 3-day history of pain and swelling in her buttocks along with a fever of 102.  Family reportedly gave her some Tylenol that helped her improve her temperature down to 99 but she is found to have elevated blood pressures for last few days when in the 400s.  She has not been taking her insulin for last 2 days and has not been checking her glucose regularly.  On admission she stated that the left buttock pain lasted for last few days and was severe 10 out of 10 and reportedly was a boil that then progressed.  She was worked up and admitted for sepsis secondary to abscess of the left buttock as well as DKA.  She was placed in the stepdown unit and was placed on glucose stabilizer and has improvement in her blood sugars.  She was incidentally noted to be in atrial fibrillation/A Flutter which then converted to NSR a few days ago.  Cardiology was consulted for preoperative clearance and patient was taken for incision and drainage of her abscess on her left buttock and she is POD2. DKA improved and she was transitioned to Long Acting Insulin even though Her CO2 is not >20 given that she has chronic Metabolic Acidosis 2/2 to her Renal Disease and is on Sodium Bicarbonate. She tolerated her Diet without issues but complained of some Abdominal Pain.   Liver function tests were elevated and right upper quadrant ultrasound was done and showed possible acute cholecystitis.  I discussed with general surgery Dr. Jens Som  who recommends obtaining a HIDA scan however HIDA scan with EF is not able to be done at this time and if EF is needed to be done will come will be on Tuesday per nursing. So will obtain a HIDA scan without the EF currently.  And this HIDA scan was normal hepatobiliary scan so Dr. Windle Guard was less inclined for acute cholecystitis and felt that her LFTs being elevated could be reactionary.  We will continue to monitor closely and antibiotics have been de-escalated to just IV cefepime Flagyl for now and will continue to de-escalate accordingly.  Echocardiogram was done yesterday.  Today the patient was lethargic and slower to respond.  Obtained an ABG which showed some mild hypoxemia and ammonia level was 18.  Head CT was done and showed no acute intracranial findings and mild atrophy and white matter microvascular disease.  WBC is slowly trending up so repeat cultures were ordered including a chest x-ray, blood cultures, urinalysis.  Chest x-ray showed minimal bibasilar segment atelectasis.  Patient was complaining of left foot pain so foot x-ray was also obtained findings.  Patient's somnolence does not improve will obtain a neurologic consult in the a.m.  Repeat urinalysis showed cloudy urine and small hemoglobin along with moderate leukocytes, few bacteria and 11-20 BCs.  She is currently on antibiotics with IV cefepime.  Assessment & Plan:   Active Problems:   Essential hypertension   CAD (coronary artery disease)   GERD (gastroesophageal reflux disease)   DKA (diabetic ketoacidoses) (HCC)   Chronic  kidney disease (CKD), stage IV (severe) (HCC)   Abscess of left buttock   Sepsis (HCC)   Atrial fibrillation with RVR (HCC)   Elevated troponin   Abnormal LFTs  Sepsis secondary to the abscess of the left buttock s/p I and D of Abscess POD 2, improving -Patient met sepsis criteria with a fever, leukocytosis, and tachycardia (Atrial Fibrillation).  Leukocytosis now trending down -This protocol  initiated in the ED and patient was given 30 mL's per KG doses -Sepsis physiology is improving but white blood cell count bumped slightly -Lactic acid level is now improved -CT Pelvis scan showed "Edema and gas identified in the subcutaneous fat of the medial left gluteal region without associated abscess. Imaging features compatible with soft tissue infection/cellulitis/" -Patient was placed on broad-spectrum antibiotics including vancomycin and IV cefepime as well as metronidazole to cover anaerobes. IV Vancomycin now discontinued  -Procalcitonin level was 6.89 and trended down to 6.14 and lactic acid trended down from 2.9 is now 1.0 -WBC went from 27.5 -> 21.8 -> 18.9 -> 20.8 and is still worsening at 23.2 -SARS-CoV-2 Negative  -General Surgery was consulted for further evaluation and patient was rehydrated and treated for her medical complications -Since blood sugars are better controlled today patient will be going for surgery and cardiology is evaluated for preoperative clearance next -Continue monitor temperature curve and cultures; Blood Cx x2 showed NGTD at 4 Days  -Aerobic/Anaerobic Gram Stain showed: Gram Stain ABUNDANT WBC PRESENT,BOTH PMN AND MONONUCLEAR  ABUNDANT GRAM POSITIVE COCCI  ABUNDANT GRAM NEGATIVE RODS   Culture FEW ESCHERICHIA COLI   -E Coli was Pan-sensitive so will narrow Abx to just IV Cefepime and Flagyl  -MRSA negative  -Unclear why patient was placed on contact precautions however there is no evidence that the patient needs to be a contact precautions and will be discontinued -Repeat urinalysis given patient's somnolence and lethargy today and showed patient with small hemoglobin, moderate leukocytes, negative nitrites, few bacteria, 0-5 9 squamous epithelial cells, as well as 11-20 WBCs.  Patient is currently on IV cefepime and IV Flagyl and will continue. -Repeat chest x-ray done showed no pneumonia but did show bibasilar atelectasis. -General Surgery Recommending  BID Wet to dry Dressing Changes; Patient would not allow me to view wound again but after discussing with Dr. Windle Guard likely may need some more debridement later this week -Repeat CBC  In AM   DKA in the setting of Diabetes Mellitus Type 2, improved -Patient was placed on glucose stabilizer and rehydrated and other collections are being corrected -We will transition to home dose of insulin per protocol once anion gap closes and CO2 is greater than 202 however she has chronic Metabolic Acidosis and has been stable so will transition as she is tolerating her meals  -CO2 this Afternoon was 16 and AG was 11 -Continued glucose stabilizer and discontinue 1-2 Hours after transitioned to Long Acting  -On admission patient had a Beta-Hydroxybutyric acid of 2.29 -Continue antibiotics as above -Patient was likely in DKA secondary to infection from left buttock abscess -Transitioned to 15 units of Lantus Daily, Start on Moderate Novolog SSI q4h, and 6 units TIDwm -We will need diabetes education coordinator to follow and appreciate input  -HbA1c as 8.4 -CBGs have been ranging from 124-1 96 now -Continue to Monitor CBG's carefully   Essential Hypertension -Resume home medications when able to;  -C/w Home Metoprolol XL 25 mg po Daily  -She was started on a Cardizem drip and p.o. diltiazem has  now been discontinued but she converted to NSR and Cardizem gtt was stopped and She was started on Cardizem 120 mg po Daily; This was held due to Bradycardia has now been stopped -We will continue Clonidine 0.1 mg p.o. twice daily -Need to monitor closely and adjust medication as accordingly -BP was elevated at 156/52 and cardiology added 2.5 mg p.o. amlodipine  CAD -Currently holding off on Aspirin given surgical intervention needed and will resume in AM  -We will continue Metoprolol -Patient is currently chest pain-free -Cardiology is evaluating and feels no ischemic work-up is needed currently in recommend  resuming home Toprol 25 mg p.o. daily along with pravastatin and aspirin after her procedures done  Elevated Troponin -Denies any chest pain and there is no ischemic changes on EKG -Likely in the setting of CKD and worsening renal clearance -Likely demand ischemia in the setting of sepsis -Continue to cycle cardiac enzymes have been trending down and went from 0.09 -> 0.10 -> 0.08 -> 0.07 -Echocardiogram has been ordered and pending to be done  AKI on CKD stage IV Metabolic Acidosis  -Patient presented with a BUN/creatinine of 125/4.76 is now improving and is now stable at 99/3.15 -IVF now D/C'd  -Avoid Nephrotoxic medications, contrast dyes, as well as hypertension if possible -Continue with Calcitriol -Continue with sodium bicarb 650 mg p.o. 4 times daily -Continue monitor and trend renal function -There is been discussions for possible need for dialysis in the future however patient is continually declined -Repeat CMP in a.m.  Atrial fibrillation/flutter with RVR versus MAT; Cardiology feels this is atrial flutter with variable block s/p Conversion to NSR with transient 3rd Degree AVB -Cardiology was consulted feels this is new in the setting of sepsis -Echo has been ordered along with TSH and cardiac enzymes are being cycled; Echo still isn't done  -Patient was started on a Cardizem drip and p.o. Cardizem was held (Takes 300 mg po Daily at home); Since she converted to NSR last night Cardizem gtt stopped and she was started on Cardizem CD 120 mg Daily but this was held due to mild Bradycardia and stopped due to AVB over night and Dr. Meda Coffee believes its from AVN Blocking drugs and Sleep Apnea -Patient will be restarted on metoprolol XL 25 mg p.o. daily -Cardiology feels there is no need for anticoagulation at this time given her short duration need for surgical intervention -We will continue monitor closely on telemetry for recurrence -Checked ECHOCardiogram and showed the left  ventricle has hyperdynamic systolic function, with an ejection fraction of >65%. The cavity size was normal. There is moderate concentric left ventricular hypertrophy. Left ventricular diastolic Doppler parameters are indeterminate.  Hypothyroidism -Checked TSH and was 1.815 -Continue levothyroxine 50 mcg p.o. daily  Morbid Obesity -Estimated body mass index is 43.08 kg/m as calculated from the following:   Height as of this encounter: 5' 3"  (1.6 m).   Weight as of this encounter: 110.3 kg. -Weight Loss and Dietary Counseling given   History of Gout -Currently colchicine is being held but will resume allopurinol 300 mg p.o. daily  Abnormal LFT's, improving  -? Reactive or Medication Induced -AST went from 37 -> 47 -> 65 -> 52 -> 32 -ALT went from 34 -> 40 -> 49 -> 47 -> 34 -Check RUQ U/S and Acute Hepatitis Panel; Acute Hepatitis Panel Pending  -RUQ U/S Showed Gallbladder sludge and positive sonographic Murphy's sign.No biliary duct dilatation -Ordered HIDA and was it was normal -Discussed with General Surgery and  they recommended no further intervention  -Continue to Monitor and Trend Hepatic Function -Repeat CMP in AM  Hyponatremia -Patient's Na+ is now 133 -IVF now D/C'd  -Continue to Monitor and Trend -Repeat CMP in AM   Normocytic Anemia/Anemia of Chronic Kidney Disease -Patient's Hb/Hct went from 11.4/35.0 -> 9.9/30.0 -> 8.7/28.0 -> 8.6/27.0 -> 8.4/26.2 -Likely also has a Dilutional Drop and Post-operative drop -Check Anemia Panel in the AM -Continue to Monitor for S/Sx of Bleeding -Repeat CMP in AM   Hypermagnesemia -Mild at 2.5 -Continue to Monitor and Repeat Mag Level in AM   Somnolence and Lethargy -Unclear etiology but patient started to respond and did not respond fully -Checked head CT and it was negative -Obtain ABG and she was mildly hypoxemic -Ammonia level was 18 -We will place on delirium precautions -Unclear etiology at this point and if continues  to happen or worsen will obtain a neurology consult in the a.m. -Continue to treat infection and WBC started to go up will likely need a repeat I&D and debridement and per discussion with Dr. Windle Guard yesterday -Remain in the stepdown unit for now given her somnolence -Repeat cultures and repeat chest x-ray -We will need to limit sedating medications and she receives hydrocodone-acetaminophen 1 to 2 tablets p.o. every 4 PRN for moderate pain and IV morphine 2 to 3 mg IV every 2 PRN for severe pain will cut back on this -Discussed with nurse and she will receive morphine when she gets her dressing changes. -Continue monitor very carefully  DVT prophylaxis: SCDs Code Status: FULL CODE Family Communication: No family present at bedside but discussed with Sister over the phone Disposition Plan: Remain In SDU given Somnolence and Drowsiness  Consultants:   General Surgery  Cardiology   Procedures:  I and D of Left Buttock and Perinanal Abscess done by Dr. Ninfa Linden on 11/09/2018  ECHOCARDIOGRAM IMPRESSIONS    1. The left ventricle has hyperdynamic systolic function, with an ejection fraction of >65%. The cavity size was normal. There is moderate concentric left ventricular hypertrophy. Left ventricular diastolic Doppler parameters are indeterminate.  2. The right ventricle has normal systolic function. The cavity was normal. There is no increase in right ventricular wall thickness.  3. Left atrial size was mildly dilated.  4. The pericardial effusion is posterior to the left ventricle.  5. Trivial pericardial effusion is present.  6. The mitral valve is grossly normal. No evidence of mitral valve stenosis.  7. The tricuspid valve is grossly normal.  8. The aortic valve is tricuspid. Mild thickening of the aortic valve. No stenosis of the aortic valve.  FINDINGS  Left Ventricle: The left ventricle has hyperdynamic systolic function, with an ejection fraction of >65%. The cavity size was  normal. There is moderate concentric left ventricular hypertrophy. Left ventricular diastolic Doppler parameters are  indeterminate.  Right Ventricle: The right ventricle has normal systolic function. The cavity was normal. There is no increase in right ventricular wall thickness.  Left Atrium: Left atrial size was mildly dilated.  Right Atrium: Right atrial size was normal in size.  Interatrial Septum: No atrial level shunt detected by color flow Doppler.  Pericardium: Trivial pericardial effusion is present. The pericardial effusion is posterior to the left ventricle.  Mitral Valve: The mitral valve is grossly normal. Mitral valve regurgitation is mild by color flow Doppler. No evidence of mitral valve stenosis.  Tricuspid Valve: The tricuspid valve is grossly normal. Tricuspid valve regurgitation is mild by color flow Doppler.  Aortic  Valve: The aortic valve is tricuspid Mild thickening of the aortic valve. Aortic valve regurgitation was not visualized by color flow Doppler. There is No stenosis of the aortic valve.  Pulmonic Valve: The pulmonic valve was not assessed. Pulmonic valve regurgitation was not assessed by color flow Doppler.  Venous: The inferior vena cava is normal in size with greater than 50% respiratory variability.    +--------------+-------++  LEFT VENTRICLE           +----------------+---------++ +--------------+-------++  Diastology                    PLAX 2D                  +----------------+---------++ +--------------+-------++  LV e' lateral:   4.13 cm/s    LVIDd:         5.28 cm   +----------------+---------++ +--------------+-------++  LV E/e' lateral: 11.4         LVIDs:         3.81 cm   +----------------+---------++ +--------------+-------++  LV e' medial:    3.70 cm/s    LV PW:         1.17 cm   +----------------+---------++ +--------------+-------++  LV E/e' medial:  12.7         LV IVS:        1.40 cm    +----------------+---------++ +--------------+-------++  LV SV:         72 ml     +--------------+-------++  LV SV Index:   31.56     +--------------+-------++                           +--------------+-------++  +-----------+-------++----------++  LEFT ATRIUM          Index        +-----------+-------++----------++  LA diam:    4.40 cm  2.09 cm/m   +-----------+-------++----------++    +-------------+-------++  AORTA                   +-------------+-------++  Ao Root diam: 3.40 cm   +-------------+-------++  +--------------+----------++  MITRAL VALVE                +--------------+----------++  MV Area (PHT): 2.42 cm     +--------------+----------++  MV PHT:        91.06 msec   +--------------+----------++  MV Decel Time: 314 msec     +--------------+----------++ +--------------+----------++  MV E velocity: 47.10 cm/s   +--------------+----------++  MV A velocity: 62.10 cm/s   +--------------+----------++  MV E/A ratio:  0.76         +--------------+----------++   Antimicrobials:  Anti-infectives (From admission, onward)   Start     Dose/Rate Route Frequency Ordered Stop   11/10/18 2200  vancomycin (VANCOCIN) IVPB 750 mg/150 ml premix  Status:  Discontinued     750 mg 150 mL/hr over 60 Minutes Intravenous Every 48 hours 11/08/18 2126 11/11/18 1451   11/09/18 1000  ceFEPIme (MAXIPIME) 2 g in sodium chloride 0.9 % 100 mL IVPB     2 g 200 mL/hr over 30 Minutes Intravenous Every 24 hours 11/09/18 0901     11/08/18 2200  ceFEPIme (MAXIPIME) 2 g in sodium chloride 0.9 % 100 mL IVPB  Status:  Discontinued     2 g 200 mL/hr over 30 Minutes Intravenous Every 24 hours 11/08/18 2126 11/09/18 0901   11/08/18 2030  metroNIDAZOLE (FLAGYL) IVPB 500 mg  500 mg 100 mL/hr over 60 Minutes Intravenous Every 8 hours 11/08/18 2022     11/08/18 1645  vancomycin (VANCOCIN) 2,500 mg in sodium chloride 0.9 % 500 mL IVPB     2,500 mg 250 mL/hr over 120 Minutes Intravenous  Once  11/08/18 1616 11/08/18 1941   11/08/18 1615  vancomycin (VANCOCIN) IVPB 1000 mg/200 mL premix  Status:  Discontinued     1,000 mg 200 mL/hr over 60 Minutes Intravenous  Once 11/08/18 1600 11/08/18 1616   11/08/18 1615  cefTRIAXone (ROCEPHIN) 2 g in sodium chloride 0.9 % 100 mL IVPB     2 g 200 mL/hr over 30 Minutes Intravenous  Once 11/08/18 1600 11/08/18 1712     Subjective: Seen and examined at bedside and she was more drowsy and confused today.  Head CT was unrevealing.  Patient yelled in pain when I palpated her left foot but the foot x-ray was normal.  Patient denies any pain currently.  No other concerns or complaints this time but she is very slow to respond.  Discussed this with the sister and will cut back on her pain medication to see if this helps overnight if not will call neurology for further evaluation.  Repeat infectious studies given her white blood cell count slowly climbing up.  Objective: Vitals:   11/12/18 1200 11/12/18 1300 11/12/18 1400 11/12/18 1500  BP: (!) 144/65 (!) 113/50 (!) 146/81 (!) 147/72  Pulse: 77 75 77 75  Resp: 15 18 19 18   Temp: 97.9 F (36.6 C)     TempSrc: Oral     SpO2: 97% 98% 96% 97%  Weight:      Height:        Intake/Output Summary (Last 24 hours) at 11/12/2018 1711 Last data filed at 11/12/2018 1500 Gross per 24 hour  Intake 384.37 ml  Output 1400 ml  Net -1015.63 ml   Filed Weights   11/08/18 1450 11/09/18 1254 11/10/18 0500  Weight: 111.6 kg 111.6 kg 110.3 kg   Examination: Physical Exam:  Constitutional: Well-nourished, well-developed obese African-American female who is more lethargic as well as somnolent and slow to respond and is slightly more confused today than she was yesterday. Eyes: Lids and conjunctive are normal.  Sclera anicteric ENMT: External ears nose appear normal.  Grossly normal hearing Neck: Appears supple no JVD Respiratory: Diminished auscultation bilaterally no appreciable wheezing, rales, rhonchi.   Patient was not tachypneic or using any accessory muscles to breathe but was wearing supplemental oxygen via nasal Cardiovascular: Regular rate and rhythm.  No appreciable murmurs, rubs, gallops.  Has 1+ lower extremity edema Abdomen: Soft, nontender to palpate.  Distended secondary body habitus.  Bowel sounds present GU: Deferred but has a Foley catheter in Musculoskeletal: No contractures or cyanosis.  Had painful palpation on her left foot Skin: Skin is warm and dry no appreciable rashes or lesions but would not let me view her buttocks wound.  No appreciable rashes or lesions limited skin evaluation Neurologic: Cranial nerves II through XII grossly intact no appreciable focal deficits but patient is very slow to respond Psychiatric: She is drowsy and lethargic and a little bit more confused.  Mildly anxious but today she was not alert and oriented.  Data Reviewed: I have personally reviewed following labs and imaging studies  CBC: Recent Labs  Lab 11/08/18 1533 11/09/18 0344 11/10/18 0233 11/11/18 0323 11/12/18 0700  WBC 27.5* 21.8* 18.9* 20.8* 23.2*  NEUTROABS 24.5*  --  16.4* 15.1* 19.3*  HGB 11.4*  9.9* 8.7* 8.6* 8.4*  HCT 35.0* 30.0* 28.0* 27.0* 26.2*  MCV 83.5 83.8 85.9 84.9 83.2  PLT 285 249 251 222 101   Basic Metabolic Panel: Recent Labs  Lab 11/09/18 0344  11/10/18 0233  11/10/18 1512 11/10/18 1821 11/10/18 2159 11/11/18 0323 11/12/18 0700  NA 136   136   < > 135   < > 129* 131* 131* 132* 133*  K 4.7   4.7   < > 4.9   < > 4.4 4.2 4.6 4.9 4.8  CL 110   109   < > 108   < > 103 104 104 105 104  CO2 17*   16*   < > 16*   < > 16* 16* 14* 16* 17*  GLUCOSE 119*   118*   < > 203*   < > 237* 122* 127* 125* 173*  BUN 105*   105*   < > 109*   < > 104* 86* 110* 102* 99*  CREATININE 3.48*   3.54*   < > 3.26*   < > 3.25* 3.19* 3.17* 3.29* 3.15*  CALCIUM 8.9   8.8*   < > 8.7*   < > 8.1* 8.6* 8.2* 8.5* 8.5*  MG 2.3  --  2.4  --   --   --   --  2.5* 2.5*  PHOS 2.7  --  4.1  --    --   --   --  4.3 4.5   < > = values in this interval not displayed.   GFR: Estimated Creatinine Clearance: 18.1 mL/min (A) (by C-G formula based on SCr of 3.15 mg/dL (H)). Liver Function Tests: Recent Labs  Lab 11/09/18 0344 11/10/18 0233 11/10/18 1512 11/11/18 0323 11/12/18 0700  AST 37 47* 65* 52* 32  ALT 34 40 49* 47* 34  ALKPHOS 82 79 73 78 85  BILITOT 0.4 0.5 0.4 0.7 0.8  PROT 6.0* 5.6* 5.3* 5.5* 6.1*  ALBUMIN 2.5* 2.1* 2.0* 2.0* 2.2*   No results for input(s): LIPASE, AMYLASE in the last 168 hours. Recent Labs  Lab 11/12/18 1015  AMMONIA 18   Coagulation Profile: Recent Labs  Lab 11/08/18 2037  INR 1.3*   Cardiac Enzymes: Recent Labs  Lab 11/08/18 1856 11/08/18 2330 11/09/18 0554 11/09/18 1206  TROPONINI 0.09* 0.10* 0.08* 0.07*   BNP (last 3 results) No results for input(s): PROBNP in the last 8760 hours. HbA1C: Recent Labs    11/11/18 0323  HGBA1C 8.4*   CBG: Recent Labs  Lab 11/11/18 2358 11/12/18 0345 11/12/18 0751 11/12/18 1156 11/12/18 1647  GLUCAP 143* 124* 184* 196* 157*   Lipid Profile: No results for input(s): CHOL, HDL, LDLCALC, TRIG, CHOLHDL, LDLDIRECT in the last 72 hours. Thyroid Function Tests: No results for input(s): TSH, T4TOTAL, FREET4, T3FREE, THYROIDAB in the last 72 hours. Anemia Panel: Recent Labs    11/12/18 1015  VITAMINB12 510   Sepsis Labs: Recent Labs  Lab 11/08/18 1856 11/08/18 2037 11/08/18 2330 11/09/18 0554 11/12/18 1015  PROCALCITON  --  6.89  --   --  6.13  LATICACIDVEN 1.7 2.0* 2.9* 1.0  --     Recent Results (from the past 240 hour(s))  Blood Culture (routine x 2)     Status: None (Preliminary result)   Collection Time: 11/08/18  3:33 PM  Result Value Ref Range Status   Specimen Description   Final    BLOOD RIGHT ANTECUBITAL Performed at [facility], [address] [place],  [place] [address]    Special Requests   Final    BOTTLES DRAWN AEROBIC AND ANAEROBIC Blood  Culture results may not be optimal due to an excessive volume of blood received in culture bottles Performed at [facility] [address] [place], [place] [address]    Culture   Final    NO GROWTH 4 DAYS Performed at [facility], [place] [address] [place], [place] [address]    Report Status PENDING  Incomplete  Blood Culture (routine x 2)     Status: None (Preliminary result)   Collection Time: 11/08/18  3:41 PM  Result Value Ref Range Status   Specimen Description   Final    BLOOD LEFT ANTECUBITAL Performed at [facility] [address] [place], [place] [address]    Special Requests   Final    BOTTLES DRAWN AEROBIC AND ANAEROBIC Blood Culture results may not be optimal due to an excessive volume of blood received in culture bottles Performed at [facility] [address] [place], [place] [address]    Culture   Final    NO GROWTH 4 DAYS Performed at [facility], [place] [address], [place], [place] [address]    Report Status PENDING  Incomplete  SARS Coronavirus 2 (CEPHEID- Performed in [facility] hospital lab), Hosp Order     Status: None   Collection Time: 11/08/18  4:07 PM  Result Value Ref Range Status   SARS Coronavirus 2 NEGATIVE NEGATIVE Final    Comment: (NOTE) If result is NEGATIVE SARS-CoV-2 target nucleic acids are NOT DETECTED. The SARS-CoV-2 RNA is generally detectable in upper and lower  respiratory specimens during the acute phase of infection. The lowest  concentration of SARS-CoV-2 viral copies this assay can detect is 250  copies / mL. A negative result does not preclude SARS-CoV-2 infection  and should not be used as the sole basis for treatment or other  patient management decisions.  A negative result may occur with  improper specimen collection / handling, submission of specimen other  than nasopharyngeal swab, presence of viral mutation(s) within the  areas targeted by this assay,  and inadequate number of viral copies  (<250 copies / mL). A negative result must be combined with clinical  observations, patient history, and epidemiological information. If result is POSITIVE SARS-CoV-2 target nucleic acids are DETECTED. The SARS-CoV-2 RNA is generally detectable in upper and lower  respiratory specimens dur ing the acute phase of infection.  Positive  results are indicative of active infection with SARS-CoV-2.  Clinical  correlation with patient history and other diagnostic information is  necessary to determine patient infection status.  Positive results do  not rule out bacterial infection or co-infection with other viruses. If result is PRESUMPTIVE POSTIVE SARS-CoV-2 nucleic acids MAY BE PRESENT.   A presumptive positive result was obtained on the submitted specimen  and confirmed on repeat testing.  While 2019 novel coronavirus  (SARS-CoV-2) nucleic acids may be present in the submitted sample  additional confirmatory testing may be necessary for epidemiological  and / or clinical management purposes  to differentiate between  SARS-CoV-2 and other Sarbecovirus currently known to infect humans.  If clinically indicated additional testing with an alternate test  methodology [phone]) is advised. The SARS-CoV-2 RNA is generally  detectable in upper and lower respiratory sp ecimens during the acute  phase of infection. The expected result is Negative. Fact Sheet for Patients:  StrictlyIdeas.no Fact Sheet  for Healthcare Providers: BankingDealers.co.za This test is not yet approved or cleared by the [place] and has been authorized for detection and/or diagnosis of SARS-CoV-2 by FDA under an Emergency Use Authorization (EUA).  This EUA will remain in effect (meaning this test can be used) for the duration of the COVID-19 declaration under Section 564(b)(1) of the Act, 21 U.S.C. section 360bbb-3(b)(1), unless  the authorization is terminated or revoked sooner. Performed at [facility], [place] [address] [place], [place] [address]   MRSA PCR Screening     Status: None   Collection Time: 11/08/18  8:54 PM  Result Value Ref Range Status   MRSA by PCR NEGATIVE NEGATIVE Final    Comment:        The GeneXpert MRSA Assay (FDA approved for NASAL specimens only), is one component of a comprehensive MRSA colonization surveillance program. It is not intended to diagnose MRSA infection nor to guide or monitor treatment for MRSA infections. Performed at [facility], [place] [address], [place], [place] [address]   Aerobic/Anaerobic Culture (surgical/deep wound)     Status: None (Preliminary result)   Collection Time: 11/09/18  2:06 PM  Result Value Ref Range Status   Specimen Description   Final    ABSCESS LEFT BUTTOCKS Performed at [place] [address], [place], [place] [address]    Special Requests   Final    NONE Performed at [facility], [place] [address], [place], [place] [address]    Gram Stain   Final    ABUNDANT WBC PRESENT,BOTH PMN AND MONONUCLEAR ABUNDANT GRAM POSITIVE COCCI ABUNDANT GRAM NEGATIVE RODS    Culture   Final    FEW ESCHERICHIA COLI HOLDING FOR POSSIBLE ANAEROBE Performed at [facility], [place] [address], [place], [place] [address]    Report Status PENDING  Incomplete   Organism ID, Bacteria ESCHERICHIA COLI  Final      Susceptibility   Escherichia coli - MIC*    AMPICILLIN <=2 SENSITIVE Sensitive     CEFAZOLIN <=4 SENSITIVE Sensitive     CEFEPIME <=1 SENSITIVE Sensitive     CEFTAZIDIME <=1 SENSITIVE Sensitive     CEFTRIAXONE <=1 SENSITIVE Sensitive     CIPROFLOXACIN <=0.25 SENSITIVE Sensitive     GENTAMICIN <=1 SENSITIVE Sensitive     IMIPENEM <=0.25 SENSITIVE Sensitive     TRIMETH/SULFA <=20 SENSITIVE Sensitive     AMPICILLIN/SULBACTAM <=2 SENSITIVE Sensitive     PIP/TAZO  <=4 SENSITIVE Sensitive     Extended ESBL NEGATIVE Sensitive     * FEW ESCHERICHIA COLI  Urine Culture     Status: None   Collection Time: 11/10/18 12:30 PM  Result Value Ref Range Status   Specimen Description   Final    URINE, RANDOM Performed at [place] [address], [place], [place] [address]    Special Requests   Final    NONE Performed at [facility], [place] [address], [place], [place] [address]    Culture   Final    NO GROWTH Performed at [facility], [place] [address], [place], [place] [address]    Report Status 11/11/2018 FINAL  Final    Radiology Studies: Ct Head Wo Contrast  Result Date: 11/12/2018 CLINICAL DATA:  Neurologic deficit.  Altered mental status. EXAM: CT HEAD WITHOUT CONTRAST TECHNIQUE: Contiguous axial images were obtained from the base of the skull through the vertex without intravenous contrast. COMPARISON:  PET-CT 06/12/2015 FINDINGS: Brain: No acute intracranial hemorrhage. No focal mass lesion. No CT evidence of acute infarction. No midline shift or mass effect. No hydrocephalus. Basilar cisterns are patent. Mild subcortical white matter hypodensities. Mild atrophy and ventricular dilatation. Findings not changed from prior. Vascular: No hyperdense vessel or unexpected calcification. Skull: Normal. Negative for fracture or focal lesion. Sinuses/Orbits: Paranasal sinuses and mastoid air cells are clear. Orbits are clear. Other: None. IMPRESSION: 1. No acute intracranial findings. 2. Mild atrophy and white matter microvascular disease. Electronically Signed   By: Suzy Bouchard M.D.   On: 11/12/2018 11:19   Nm Hepatobiliary Liver Func  Result Date: 11/11/2018 CLINICAL DATA:  Right upper quadrant pain with fever and elevated white count. Abnormal gallbladder ultrasound EXAM: NUCLEAR MEDICINE HEPATOBILIARY IMAGING TECHNIQUE: Sequential images of the abdomen were obtained out to 60 minutes following intravenous  administration of radiopharmaceutical. RADIOPHARMACEUTICALS:  5.0 mCi Tc-19m Choletec IV COMPARISON:  11/11/2018 FINDINGS: Prompt uptake and biliary excretion of activity by the liver is seen. Gallbladder activity is visualized, consistent with patency of cystic duct. Biliary activity passes into small bowel, consistent with patent common bile duct. IMPRESSION: Normal hepatobiliary scan Electronically Signed   By: MJerilynn Mages  Shick M.D.   On: 11/11/2018 14:05   Dg Chest Port 1 View  Result Date: 11/12/2018 CLINICAL DATA:  Shortness of breath. EXAM: PORTABLE CHEST 1 VIEW COMPARISON:  Radiograph of Nov 08, 2018. FINDINGS: The heart size and mediastinal contours are within normal limits. No pneumothorax or pleural effusion is noted. Left lung is clear. Minimal right basilar subsegmental atelectasis is noted. The visualized skeletal structures are unremarkable. IMPRESSION: Minimal right basilar subsegmental atelectasis. Electronically Signed   By: JMarijo ConceptionM.D.   On: 11/12/2018 11:41   Dg Foot 2 Views Left  Result Date: 11/12/2018 CLINICAL DATA:  Acute left foot pain. EXAM: LEFT FOOT - 2 VIEW COMPARISON:  None. FINDINGS: There is no evidence of fracture or dislocation. There is no evidence of arthropathy or other focal bone abnormality. Soft tissues are unremarkable. IMPRESSION: Negative. Electronically Signed   By: JMarijo ConceptionM.D.   On: 11/12/2018 11:39   [place]Abdomen Limited Ruq  Result Date: 11/11/2018 CLINICAL DATA:  Abnormal LFTs EXAM: ULTRASOUND ABDOMEN LIMITED RIGHT UPPER QUADRANT COMPARISON:  None. FINDINGS: Gallbladder: No gallbladder wall thickening. Sludge within the gallbladder. Trace pericholecystic fluid. Positive sonographic Murphy's sign. Common bile duct: Diameter: Normal at 5 mm Liver: No focal lesion identified. Within normal limits in parenchymal echogenicity. Portal vein is patent on color Doppler imaging with normal direction of blood flow towards the liver. IMPRESSION: 1. Gallbladder  sludge and positive sonographic Murphy's sign. Recommend clinical correlation for ACUTE CHOLECYSTITIS. 2. No biliary duct dilatation. Electronically Signed   By: SSuzy BouchardM.D.   On: 11/11/2018 06:13   Scheduled Meds:  allopurinol  300 mg Oral Daily   amLODipine  2.5 mg Oral Daily   aspirin EC  81 mg Oral Daily   calcitRIOL  0.25 mcg Oral Daily   Chlorhexidine Gluconate Cloth  6 each Topical Daily   cloNIDine  0.1 mg Oral BID   insulin aspart  0-15 Units Subcutaneous Q4H   insulin aspart  6 Units Subcutaneous TID WC   insulin glargine  15 Units Subcutaneous Daily   insulin regular  0-10 Units Intravenous TID WC   levothyroxine  50 mcg Oral Q0600   mouth rinse  15 mL Mouth Rinse BID   metoprolol succinate  50 mg Oral Daily  pravastatin  80 mg Oral QHS   sodium bicarbonate  650 mg Oral QID   Continuous Infusions:  ceFEPime (MAXIPIME) IV Stopped (11/12/18 1036)   metronidazole Stopped (11/12/18 1329)    LOS: 4 days   Kerney Elbe, DO [facility] PAGER is on AMION  If 7PM-7AM, please contact night-coverage [email or web address] Password [email or web address] 11/12/2018, 5:11 PM

## 2018-11-13 ENCOUNTER — Inpatient Hospital Stay (HOSPITAL_COMMUNITY): Payer: PPO

## 2018-11-13 LAB — CBC WITH DIFFERENTIAL/PLATELET
Abs Immature Granulocytes: 1.86 10*3/uL — ABNORMAL HIGH (ref 0.00–0.07)
Basophils Absolute: 0.2 10*3/uL — ABNORMAL HIGH (ref 0.0–0.1)
Basophils Relative: 1 %
Eosinophils Absolute: 0.2 10*3/uL (ref 0.0–0.5)
Eosinophils Relative: 1 %
HCT: 24.6 % — ABNORMAL LOW (ref 36.0–46.0)
Hemoglobin: 8.2 g/dL — ABNORMAL LOW (ref 12.0–15.0)
Immature Granulocytes: 9 %
Lymphocytes Relative: 8 %
Lymphs Abs: 1.8 10*3/uL (ref 0.7–4.0)
MCH: 27.9 pg (ref 26.0–34.0)
MCHC: 33.3 g/dL (ref 30.0–36.0)
MCV: 83.7 fL (ref 80.0–100.0)
Monocytes Absolute: 1 10*3/uL (ref 0.1–1.0)
Monocytes Relative: 5 %
Neutro Abs: 16.8 10*3/uL — ABNORMAL HIGH (ref 1.7–7.7)
Neutrophils Relative %: 76 %
Platelets: 301 10*3/uL (ref 150–400)
RBC: 2.94 MIL/uL — ABNORMAL LOW (ref 3.87–5.11)
RDW: 15.6 % — ABNORMAL HIGH (ref 11.5–15.5)
WBC: 21.8 10*3/uL — ABNORMAL HIGH (ref 4.0–10.5)
nRBC: 0.3 % — ABNORMAL HIGH (ref 0.0–0.2)

## 2018-11-13 LAB — GLUCOSE, CAPILLARY
Glucose-Capillary: 110 mg/dL — ABNORMAL HIGH (ref 70–99)
Glucose-Capillary: 92 mg/dL (ref 70–99)
Glucose-Capillary: 130 mg/dL — ABNORMAL HIGH (ref 70–99)
Glucose-Capillary: 154 mg/dL — ABNORMAL HIGH (ref 70–99)
Glucose-Capillary: 136 mg/dL — ABNORMAL HIGH (ref 70–99)
Glucose-Capillary: 98 mg/dL (ref 70–99)

## 2018-11-13 LAB — MAGNESIUM: Magnesium: 2.5 mg/dL — ABNORMAL HIGH (ref 1.7–2.4)

## 2018-11-13 LAB — HEPATITIS PANEL, ACUTE
HCV Ab: 0.1 s/co ratio (ref 0.0–0.9)
Hep A IgM: NEGATIVE
Hep B C IgM: NEGATIVE
Hepatitis B Surface Ag: NEGATIVE

## 2018-11-13 LAB — COMPREHENSIVE METABOLIC PANEL
ALT: 26 U/L (ref 0–44)
AST: 28 U/L (ref 15–41)
Albumin: 2 g/dL — ABNORMAL LOW (ref 3.5–5.0)
Alkaline Phosphatase: 70 U/L (ref 38–126)
Anion gap: 10 (ref 5–15)
BUN: 102 mg/dL — ABNORMAL HIGH (ref 8–23)
CO2: 17 mmol/L — ABNORMAL LOW (ref 22–32)
Calcium: 8.3 mg/dL — ABNORMAL LOW (ref 8.9–10.3)
Chloride: 108 mmol/L (ref 98–111)
Creatinine, Ser: 2.96 mg/dL — ABNORMAL HIGH (ref 0.44–1.00)
GFR calc Af Amer: 17 mL/min — ABNORMAL LOW (ref >60)
GFR calc non Af Amer: 15 mL/min — ABNORMAL LOW (ref >60)
Glucose, Bld: 135 mg/dL — ABNORMAL HIGH (ref 70–99)
Potassium: 4.6 mmol/L (ref 3.5–5.1)
Sodium: 135 mmol/L (ref 135–145)
Total Bilirubin: 0.8 mg/dL (ref 0.3–1.2)
Total Protein: 5.5 g/dL — ABNORMAL LOW (ref 6.5–8.1)

## 2018-11-13 LAB — CULTURE, BLOOD (ROUTINE X 2)
Culture: NO GROWTH
Culture: NO GROWTH

## 2018-11-13 LAB — PROCALCITONIN: Procalcitonin: 6.01 ng/mL

## 2018-11-13 LAB — PHOSPHORUS: Phosphorus: 4.1 mg/dL (ref 2.5–4.6)

## 2018-11-13 LAB — RPR: RPR Ser Ql: NONREACTIVE

## 2018-11-13 MED ORDER — HYDROCODONE-ACETAMINOPHEN 5-325 MG PO TABS: 1.0000 | ORAL_TABLET | Freq: Four times a day (QID) | ORAL | Status: DC | PRN

## 2018-11-13 MED ORDER — FENTANYL CITRATE (PF) 100 MCG/2ML IJ SOLN
12.5000 ug | INTRAMUSCULAR | Status: DC | PRN
  Administered 2018-11-13 – 2018-11-25 (×9): 12.5 ug via INTRAVENOUS
  Filled 2018-11-13 (×9): qty 2

## 2018-11-13 MED ORDER — OXYCODONE HCL 5 MG PO TABS
5.0000 mg | ORAL_TABLET | Freq: Four times a day (QID) | ORAL | Status: DC | PRN
  Administered 2018-11-14 – 2018-11-29 (×15): 5 mg via ORAL
  Filled 2018-11-13 (×18): qty 1

## 2018-11-13 MED ORDER — METRONIDAZOLE 500 MG PO TABS
500.0000 mg | ORAL_TABLET | Freq: Three times a day (TID) | ORAL | Status: DC
  Administered 2018-11-13 – 2018-11-14 (×2): 500 mg via ORAL
  Filled 2018-11-13 (×2): qty 1

## 2018-11-13 NOTE — Progress Notes (Signed)
Pharmacy IV to PO conversion  This patient is receiving Flagyl by the intravenous route. Based on criteria approved by the Pharmacy and Therapeutics Committee, and the Infectious Disease Division, the antibiotic(s) is/are being converted to equivalent oral dose form(s). These criteria include:   Patient being treated for a respiratory tract infection, urinary tract infection, cellulitis, or Clostridium Difficile Associated Diarrhea  The patient is not neutropenic and does not exhibit a GI malabsorption state  The patient is eating (either orally or per tube) and/or has been taking other orally administered medications for at least 24 hours.  The patient is improving clinically (physician assessment and a 24-hour Tmax of <=100.5 F)  If you have any questions about this conversion, please contact the Pharmacy Department (ext [phone]).  Thank you.  Reuel Boom, PharmD, BCPS [phone] 11/13/2018, 1:54 PM

## 2018-11-13 NOTE — Progress Notes (Signed)
HYDROTHERAPY EVALUATION     11/13/18 1700  Subjective Assessment  Subjective "ohhh" (Pt did not speak much)  Patient and Family Stated Goals  (none stated)  Prior Treatments  (s/p I&D by surgery 11/09/18)  Evaluation and Treatment  Evaluation and Treatment Procedures Explained to Patient/Family Yes  Evaluation and Treatment Procedures agreed to  Wound / Incision (Open or Dehisced) 11/13/18 Incision - Open Buttocks Left L buttocks wound s/p I&D by Surgery 11/09/18. *HYDRO/PT*  Date First Assessed/Time First Assessed: 11/13/18 1500   Wound Type: Incision - Open  Location: Buttocks  Location Orientation: Left  Wound Description (Comments): L buttocks wound s/p I&D by Surgery 11/09/18. *HYDRO/PT*  Dressing Type ABD;Barrier Film (skin prep);Impregnated gauze (petrolatum);Gauze (Comment);Moist to dry (Kerlix packing; Petrolatum on periwound)  Dressing Changed Changed  Dressing Status Old drainage  Dressing Change Frequency Twice a day  Site / Wound Assessment Black;Brown;Yellow;Pink (difficult to visualize )  % Wound base Red or Granulating 35%  % Wound base Yellow/Fibrinous Exudate 15%  % Wound base Black/Eschar 50%  Peri-wound Assessment Excoriated;Bleeding;Pink  Wound Length (cm) 8 cm  Wound Width (cm) 5 cm  Wound Depth (cm) 5 cm  Wound Volume (cm^3) 200 cm^3  Wound Surface Area (cm^2) 40 cm^2  Tunneling (cm)  (7:00-4cm; 12:00-2.5 cm)  Margins Unattached edges (unapproximated)  Drainage Amount Moderate  Drainage Description Serosanguineous  Treatment Debridement (Selective);Hydrotherapy (Pulse lavage);Packing (Saline gauze)  Hydrotherapy  Pulsed lavage therapy - wound location L buttocks  Pulsed Lavage with Suction (psi) 8 psi  Pulsed Lavage with Suction - Normal Saline Used 1000 mL  Pulsed Lavage Tip Tip with splash shield  Selective Debridement  Selective Debridement - Location  (pt would not allow debridement 2* pain)  Wound Therapy - Assess/Plan/Recommendations  Wound  Therapy - Clinical Statement 78 yo female admitted with L gluteal/buttock abscess. S/P I&D 11/09/18.   Factors Delaying/Impairing Wound Healing Diabetes Mellitus;Multiple medical problems;Immobility  Hydrotherapy Plan Debridement;Dressing change;Patient/family education;Pulsatile lavage with suction  Wound Therapy - Frequency 6X / week  Wound Therapy - Current Recommendations Case manager/social work  Wound Therapy - Follow Up Recommendations Skilled nursing facility;Home health RN  Wound Plan Will plan hydrotherapy and dressing changes 6x/week. Using petrolatum on periwound. Pt requires pre-medication 2*pain. She would not allow selective sharp debridement on today.   Wound Therapy Goals - Improve the function of patient's integumentary system by progressing the wound(s) through the phases of wound healing by:  Decrease Necrotic Tissue to 30  Decrease Necrotic Tissue - Progress Goal set today  Increase Granulation Tissue to 70  Increase Granulation Tissue - Progress Goal set today  Goals/treatment plan/discharge plan were made with and agreed upon by patient/family Yes  Time For Goal Achievement 2 weeks  Wound Therapy - Potential for Goals Good    Weston Anna, PT Acute Rehabilitation Services Pager: [phone] Office: [phone]

## 2018-11-13 NOTE — Progress Notes (Addendum)
4 Days Post-Op    CC:  Buttocks abscess  Subjective: Wound making slow improvement.  Picture below  Objective: Vital signs in last 24 hours: Temp:  [97.9 F (36.6 C)-98.6 F (37 C)] 98 F (36.7 C) (05/11 0800) Pulse Rate:  [62-90] 81 (05/11 0500) Resp:  [15-24] 18 (05/11 0500) BP: (113-157)/(49-81) 138/70 (05/11 0500) SpO2:  [93 %-98 %] 97 % (05/11 0500) Weight:  [161 kg] 113 kg (05/11 0400)  296 IV recorded 1500 urine recorded Afebrile vital signs are stable Creatinine 3.43(5/6) >> 2.96(5/11) WBC  21.8(5/7)>> 18.9>> 20.8>> 21.8(5/11)  intake/Output from previous day: 05/10 0701 - 05/11 0700 In: 296.8 [IV Piggyback:296.8] Out: 1500 [Urine:1500] Intake/Output this shift: No intake/output data recorded.  General appearance: alert, cooperative and mild distress Skin: Skin color, texture, turgor normal away from the main abscess site.  . No rashes or lesions or she has some black tissue and white fat necrosis withing the wound.  It is ~ 6 x 6 x 5 cm.  she has skin sloughing from the buttocks around the open abscess site.  I debrided some of the tissue within the decubitus.  She was very tender and had pain even touching the dead tissue.  Examined by myself and Dr. Kae Heller.  No tracking that needs more OR debridement.        Lab Results:  Recent Labs    11/12/18 0700 11/13/18 0223  WBC 23.2* 21.8*  HGB 8.4* 8.2*  HCT 26.2* 24.6*  PLT 261 301    BMET Recent Labs    11/12/18 0700 11/13/18 0223  NA 133* 135  K 4.8 4.6  CL 104 108  CO2 17* 17*  GLUCOSE 173* 135*  BUN 99* 102*  CREATININE 3.15* 2.96*  CALCIUM 8.5* 8.3*   PT/INR No results for input(s): LABPROT, INR in the last 72 hours.  Recent Labs  Lab 11/10/18 0233 11/10/18 1512 11/11/18 0323 11/12/18 0700 11/13/18 0223  AST 47* 65* 52* 32 28  ALT 40 49* 47* 34 26  ALKPHOS 79 73 78 85 70  BILITOT 0.5 0.4 0.7 0.8 0.8  PROT 5.6* 5.3* 5.5* 6.1* 5.5*  ALBUMIN 2.1* 2.0* 2.0* 2.2* 2.0*     Lipase     Component Value Date/Time   LIPASE 21 06/24/2014 0816     Medications: . allopurinol  300 mg Oral Daily  . amLODipine  2.5 mg Oral Daily  . aspirin EC  81 mg Oral Daily  . calcitRIOL  0.25 mcg Oral Daily  . Chlorhexidine Gluconate Cloth  6 each Topical Daily  . cloNIDine  0.1 mg Oral BID  . insulin aspart  0-15 Units Subcutaneous Q4H  . insulin aspart  6 Units Subcutaneous TID WC  . insulin glargine  15 Units Subcutaneous Daily  . levothyroxine  50 mcg Oral Q0600  . mouth rinse  15 mL Mouth Rinse BID  . metoprolol succinate  50 mg Oral Daily  . pravastatin  80 mg Oral QHS  . sodium bicarbonate  650 mg Oral QID    Assessment/Plan Sepsis DKA Type 2 diabetes Chronic on acute;  stage IV kidney disease Hypertension Elevated troponin/Atrial fibrillation Hx of palpitations  GERD Hx of seizures 06/2015 Morbid obesity BMI 43.5 Leukocytosis 21.8(5/7)>> 18.9>> 20.8>> 21.8(5/11)  Left buttocks/perianal abscess Incision and drainage of left buttocks abscess 11/09/2018, DR. Coralie Keens  FEN: Heart healthy/carb modified diet ID: Rocephin 5/6 x 1 dose; Maxipime 5/7 >> day 5,  Flagyl 5/7>> day ; vancomycin x1 dose 11/08/2018 DVT:  SCDs/can be on chemical DVT prophylaxis from our standpoint Follow-up: To be determined POC: Sister Thomasenia Sales -[phone]    Plan:  Start hydrotherapy, continue BID dressing changes,  Add xeroform gauze to area where skin is sloughing around the open wound.      LOS: 5 days    Emily Hughes 11/13/2018 [phone]

## 2018-11-13 NOTE — Progress Notes (Signed)
PROGRESS NOTE    Kimball Manske  YWV:[id number] DOB: 1941/06/04 DOA: 11/08/2018 PCP: Susy Frizzle, MD   Brief Narrative:  HPI per Dr. Toy Baker on 11/08/2018  Patient is a 78 year old African-American female with a past medical history significant for but not limited to diabetes mellitus type 2, CKD stage IV, hypothyroidism, CAD, GERD, hypertension, hyperlipidemia, history of seizure disorder, ? Atrial fibrillation, long with comorbidities who presented to the emergency room with a 3-day history of pain and swelling in her buttocks along with a fever of 102.  Family reportedly gave her some Tylenol that helped her improve her temperature down to 99 but she is found to have elevated blood pressures for last few days when in the 400s.  She has not been taking her insulin for last 2 days and has not been checking her glucose regularly.  On admission she stated that the left buttock pain lasted for last few days and was severe 10 out of 10 and reportedly was a boil that then progressed.  She was worked up and admitted for sepsis secondary to abscess of the left buttock as well as DKA.  She was placed in the stepdown unit and was placed on glucose stabilizer and has improvement in her blood sugars.  She was incidentally noted to be in atrial fibrillation/A Flutter which then converted to NSR a few days ago.  Cardiology was consulted for preoperative clearance and patient was taken for incision and drainage of her abscess on her left buttock and she is POD2. DKA improved and she was transitioned to Long Acting Insulin even though Her CO2 is not >20 given that she has chronic Metabolic Acidosis 2/2 to her Renal Disease and is on Sodium Bicarbonate. She tolerated her Diet without issues but complained of some Abdominal Pain.   Liver function tests were elevated and right upper quadrant ultrasound was done and showed possible acute cholecystitis.  I discussed with general surgery Dr. Jens Som  who recommends obtaining a HIDA scan however HIDA scan with EF is not able to be done at this time and if EF is needed to be done will come will be on Tuesday per nursing. So will obtain a HIDA scan without the EF currently.  And this HIDA scan was normal hepatobiliary scan so Dr. Windle Guard was less inclined for acute cholecystitis and felt that her LFTs being elevated could be reactionary.  We will continue to monitor closely and antibiotics have been de-escalated to just IV cefepime Flagyl for now and will continue to de-escalate accordingly.  Echocardiogram was done yesterday.  On 11/12/2018 the patient was lethargic and slower to respond.  Obtained an ABG which showed some mild hypoxemia and ammonia level was 18.  Head CT was done and showed no acute intracranial findings and mild atrophy and white matter microvascular disease.  WBC is slowly trending up so repeat cultures were ordered including a chest x-ray, blood cultures, urinalysis.  Chest x-ray showed minimal bibasilar segment atelectasis.  Patient was complaining of left foot pain so foot x-ray was also obtained findings.  Patient's somnolence does not improve will obtain a neurologic consult in the a.m.  Repeat urinalysis showed cloudy urine and small hemoglobin along with moderate leukocytes, few bacteria and 11-20 BCs.  She is currently on antibiotics with IV cefepime.  She remains somnolent (received 10 mg of po Hydrocodone this AM) so narcotics have been adjusted and IV morphine has been discontinued along with p.o. hydrocodone.  We will continue to  monitor and white blood cell count is now trending down but in general surgery is recommending hydrotherapy but no further surgical intervention.  Assessment & Plan:   Active Problems:   Essential hypertension   CAD (coronary artery disease)   GERD (gastroesophageal reflux disease)   DKA (diabetic ketoacidoses) (HCC)   Chronic kidney disease (CKD), stage IV (severe) (HCC)   Abscess of left  buttock   Sepsis (HCC)   Atrial fibrillation with RVR (HCC)   Elevated troponin   Abnormal LFTs  Sepsis secondary to the abscess of the left buttock s/p I and D of Abscess POD 4, improving -Patient met sepsis criteria with a fever, leukocytosis, and tachycardia (Atrial Fibrillation).  Leukocytosis now trending down -This protocol initiated in the ED and patient was given 30 mL's per KG doses -Sepsis physiology is improving but white blood cell count bumped slightly and now trending back down again  -Lactic acid level is now improved -CT Pelvis scan showed "Edema and gas identified in the subcutaneous fat of the medial left gluteal region without associated abscess. Imaging features compatible with soft tissue infection/cellulitis/" -Patient was placed on broad-spectrum antibiotics including vancomycin and IV cefepime as well as metronidazole to cover anaerobes. IV Vancomycin now discontinued  -Procalcitonin level was 6.89 and trended down to 6.14 and lactic acid trended down from 2.9 is now 1.0 -WBC went from 27.5 -> 21.8 -> 18.9 -> 20.8 -> 23.2 -> 21.8 -SARS-CoV-2 Negative  -General Surgery was consulted for further evaluation and patient was rehydrated and treated for her medical complications -Since blood sugars are better controlled today patient will be going for surgery and cardiology is evaluated for preoperative clearance next -Continue monitor temperature curve and cultures; Blood Cx x2 showed NGTD at 5 Days  -Aerobic/Anaerobic Gram Stain showed: Gram Stain ABUNDANT WBC PRESENT,BOTH PMN AND MONONUCLEAR  ABUNDANT GRAM POSITIVE COCCI  ABUNDANT GRAM NEGATIVE RODS   Culture FEW ESCHERICHIA COLI   -E Coli was Pan-sensitive so will narrow Abx to just IV Cefepime and Flagyl; Flagyl has been changed to po  -MRSA negative  -Unclear why patient was placed on contact precautions however there is no evidence that the patient needs to be a contact precautions and will be discontinued -Repeat  urinalysis given patient's somnolence and lethargy today and showed patient with small hemoglobin, moderate leukocytes, negative nitrites, few bacteria, 0-5 9 squamous epithelial cells, as well as 11-20 WBCs.  Patient is currently on IV cefepime and IV Flagyl and will continue. -Repeat chest x-ray done 11/12/2018 showed no pneumonia but did show bibasilar atelectasis. -General Surgery Recommending BID Wet to dry Dressing Changes; General Surgery recommending Hydrotherapy  -Repeat CBC  In AM   DKA in the setting of Diabetes Mellitus Type 2, improved -Improved and off of Glucose-Stabilizer -CO2 this AM was 17  and AG was 10  -On admission patient had a Beta-Hydroxybutyric acid of 2.29 -Continue antibiotics as above -Patient was likely in DKA secondary to infection from left buttock abscess -Transitioned to 15 units of Lantus Daily, Start on Moderate Novolog SSI q4h, and 6 units TIDwm -We will need diabetes education coordinator to follow and appreciate input  -HbA1c as 8.4 -CBGs have been ranging from 92-196 -Continue to Monitor CBG's carefully   Essential Hypertension -Resume home medications when able to;  -C/w Home Metoprolol XL 25 mg po Daily  -She was started on a Cardizem drip and p.o. diltiazem has now been discontinued but she converted to NSR and Cardizem gtt was stopped and  She was started on Cardizem 120 mg po Daily; This was held due to Bradycardia has now been stopped -We will continue Clonidine 0.1 mg p.o. twice daily -Need to monitor closely and adjust medication as accordingly -BP improved and was 117/56 this AM -Cardiology added 2.5 mg p.o. amlodipine  CAD -Resumed ASA 81 mg po Daily  -We will continue Metoprolol -Patient is currently chest pain-free -Cardiology is evaluating and feels no ischemic work-up is needed currently in recommend resuming home Toprol 25 mg p.o. daily along with pravastatin and aspirin after her procedures done  Elevated Troponin -Denies any  chest pain and there is no ischemic changes on EKG -Likely in the setting of CKD and worsening renal clearance -Likely demand ischemia in the setting of sepsis -Continue to cycle cardiac enzymes have been trending down and went from 0.09 -> 0.10 -> 0.08 -> 0.07 -Echocardiogram has been ordered and pending to be done  AKI on CKD stage IV, improving  Metabolic Acidosis, stable  -Patient presented with a BUN/creatinine of 125/4.76 is now improving and is now stable at 102/2.96 -IVF now D/C'd  -Avoid Nephrotoxic medications, contrast dyes, as well as hypertension if possible -Continue with Calcitriol -Continue with sodium bicarb 650 mg p.o. 4 times daily -Continue monitor and trend renal function -There is been discussions for possible need for dialysis in the future however patient is continually declined -Repeat CMP in a.m.  Atrial fibrillation/flutter with RVR versus MAT; Cardiology feels this is atrial flutter with variable block s/p Conversion to NSR with transient 3rd Degree AVB, -Cardiology was consulted feels this is new in the setting of sepsis -Echo has been ordered along with TSH and cardiac enzymes are being cycled; Echo still isn't done  -Patient was started on a Cardizem drip and p.o. Cardizem was held (Takes 300 mg po Daily at home); Since she converted to NSR last night Cardizem gtt stopped and she was started on Cardizem CD 120 mg Daily but this was held due to mild Bradycardia and stopped due to AVB over night and Dr. Meda Coffee believes its from AVN Blocking drugs and Sleep Apnea -Patient will be restarted on metoprolol XL 25 mg p.o. daily -Cardiology feels there is no need for anticoagulation at this time given her short duration need for surgical intervention -We will continue monitor closely on telemetry for recurrence -Checked ECHOCardiogram and showed the left ventricle has hyperdynamic systolic function, with an ejection fraction of >65%. The cavity size was normal. There is  moderate concentric left ventricular hypertrophy. Left ventricular diastolic Doppler parameters are indeterminate.  Hypothyroidism -Checked TSH and was 1.815 -Continue levothyroxine 50 mcg p.o. daily  Morbid Obesity -Estimated body mass index is 44.13 kg/m as calculated from the following:   Height as of this encounter: _0  (1.6 m).   Weight as of this encounter: 113 kg. -Weight Loss and Dietary Counseling given   History of Gout -Currently colchicine is being held but will resume allopurinol 300 mg p.o. daily  Abnormal LFT's, improving  -? Reactive or Medication Induced -AST went from 37 -> 47 -> 65 -> 52 -> 32 -> 28 -ALT went from 34 -> 40 -> 49 -> 47 -> 34 -> 26 -Check RUQ U/S and Acute Hepatitis Panel; Acute Hepatitis Panel Negative   -RUQ U/S Showed Gallbladder sludge and positive sonographic Murphy's sign.No biliary duct dilatation -Ordered HIDA and was it was normal -Discussed with General Surgery and they recommended no further intervention  -Continue to Monitor and Trend Hepatic Function -  Repeat CMP in AM  Hyponatremia -Patient's Na+ is now 135 -IVF now D/C'd  -Continue to Monitor and Trend -Repeat CMP in AM   Normocytic Anemia/Anemia of Chronic Kidney Disease -Patient's Hb/Hct went from 11.4/35.0 -> 9.9/30.0 -> 8.7/28.0 -> 8.6/27.0 -> 8.4/26.2 -> 8.2/24.6 -Likely also has a Dilutional Drop and Post-operative drop -Check Anemia Panel in the AM -Continue to Monitor for S/Sx of Bleeding -Repeat CMP in AM   Hypermagnesemia -Mild at 2.5 -Continue to Monitor and Repeat Mag Level in AM   Somnolence and Lethargy -Unclear etiology but patient started to respond and did not respond fully but likely this is Medication induced  -Checked head CT and it was negative -Obtain ABG and she was mildly hypoxemic -Ammonia level was 18 -We will place on delirium precautions -Continue to treat infection and WBC started to go up but will not need a repeat ID  -Remain in the  stepdown unit for now given her somnolence -Repeat cultures and repeat CXR in AM  -We will need to limit sedating medications and she receives hydrocodone-acetaminophen 1 to 2 tablets p.o. every 4 PRN for moderate pain and IV morphine 2 to 3 mg IV every 2 PRN for severe pain will cut back on this; Discontinued IV Morphine altogether and changed po Hydrocodone-Acetaminophen to po Oxycodone -Discussed with nurse and she will receive morphine when she gets her dressing changes but this will be changed -Continue monitor very carefully -If not improving even with reducing Sedating medications may need MRI -Continue to Monitor   DVT prophylaxis: SCDs Code Status: FULL CODE Family Communication: No family present at bedside Disposition Plan: Remain In SDU given Somnolence and Drowsiness and transfer to General Medical Floor if improving   Consultants:   General Surgery  Cardiology   None  Procedures:  I and D of Left Buttock and Perinanal Abscess done by Dr. Ninfa Linden on 11/09/2018  ECHOCARDIOGRAM IMPRESSIONS    1. The left ventricle has hyperdynamic systolic function, with an ejection fraction of >65%. The cavity size was normal. There is moderate concentric left ventricular hypertrophy. Left ventricular diastolic Doppler parameters are indeterminate.  2. The right ventricle has normal systolic function. The cavity was normal. There is no increase in right ventricular wall thickness.  3. Left atrial size was mildly dilated.  4. The pericardial effusion is posterior to the left ventricle.  5. Trivial pericardial effusion is present.  6. The mitral valve is grossly normal. No evidence of mitral valve stenosis.  7. The tricuspid valve is grossly normal.  8. The aortic valve is tricuspid. Mild thickening of the aortic valve. No stenosis of the aortic valve.  FINDINGS  Left Ventricle: The left ventricle has hyperdynamic systolic function, with an ejection fraction of >65%. The cavity size  was normal. There is moderate concentric left ventricular hypertrophy. Left ventricular diastolic Doppler parameters are  indeterminate.  Right Ventricle: The right ventricle has normal systolic function. The cavity was normal. There is no increase in right ventricular wall thickness.  Left Atrium: Left atrial size was mildly dilated.  Right Atrium: Right atrial size was normal in size.  Interatrial Septum: No atrial level shunt detected by color flow Doppler.  Pericardium: Trivial pericardial effusion is present. The pericardial effusion is posterior to the left ventricle.  Mitral Valve: The mitral valve is grossly normal. Mitral valve regurgitation is mild by color flow Doppler. No evidence of mitral valve stenosis.  Tricuspid Valve: The tricuspid valve is grossly normal. Tricuspid valve regurgitation is mild  by color flow Doppler.  Aortic Valve: The aortic valve is tricuspid Mild thickening of the aortic valve. Aortic valve regurgitation was not visualized by color flow Doppler. There is No stenosis of the aortic valve.  Pulmonic Valve: The pulmonic valve was not assessed. Pulmonic valve regurgitation was not assessed by color flow Doppler.  Venous: The inferior vena cava is normal in size with greater than 50% respiratory variability.    +--------------+-------++ LEFT VENTRICLE        +----------------+---------++ +--------------+-------++ Diastology                PLAX 2D               +----------------+---------++ +--------------+-------++ LV e' lateral:  4.13 cm/s LVIDd:        5.28 cm +----------------+---------++ +--------------+-------++ LV E/e' lateral:11.4      LVIDs:        3.81 cm +----------------+---------++ +--------------+-------++ LV e' medial:   3.70 cm/s LV PW:        1.17 cm +----------------+---------++ +--------------+-------++ LV E/e' medial: 12.7      LV IVS:       1.40 cm +----------------+---------++  +--------------+-------++ LV SV:        72 ml   +--------------+-------++ LV SV Index:  31.56   +--------------+-------++                       +--------------+-------++  +-----------+-------++----------++ LEFT ATRIUM       Index      +-----------+-------++----------++ LA diam:   4.40 cm2.09 cm/m +-----------+-------++----------++    +-------------+-------++ AORTA                +-------------+-------++ Ao Root diam:3.40 cm +-------------+-------++  +--------------+----------++ MITRAL VALVE             +--------------+----------++ MV Area (PHT):2.42 cm   +--------------+----------++ MV PHT:       91.06 msec +--------------+----------++ MV Decel Time:314 msec   +--------------+----------++ +--------------+----------++ MV E velocity:47.10 cm/s +--------------+----------++ MV A velocity:62.10 cm/s +--------------+----------++ MV E/A ratio: 0.76       +--------------+----------++   Antimicrobials:  Anti-infectives (From admission, onward)   Start     Dose/Rate Route Frequency Ordered Stop   11/13/18 2200  metroNIDAZOLE (FLAGYL) tablet 500 mg     500 mg Oral Every 8 hours 11/13/18 1356     11/10/18 2200  vancomycin (VANCOCIN) IVPB 750 mg/150 ml premix  Status:  Discontinued     750 mg 150 mL/hr over 60 Minutes Intravenous Every 48 hours 11/08/18 2126 11/11/18 1451   11/09/18 1000  ceFEPIme (MAXIPIME) 2 g in sodium chloride 0.9 % 100 mL IVPB     2 g 200 mL/hr over 30 Minutes Intravenous Every 24 hours 11/09/18 0901     11/08/18 2200  ceFEPIme (MAXIPIME) 2 g in sodium chloride 0.9 % 100 mL IVPB  Status:  Discontinued     2 g 200 mL/hr over 30 Minutes Intravenous Every 24 hours 11/08/18 2126 11/09/18 0901   11/08/18 2030  metroNIDAZOLE (FLAGYL) IVPB 500 mg  Status:  Discontinued     500 mg 100 mL/hr over 60 Minutes Intravenous Every 8 hours 11/08/18 2022 11/13/18 1356   11/08/18 1645  vancomycin (VANCOCIN)  2,500 mg in sodium chloride 0.9 % 500 mL IVPB     2,500 mg 250 mL/hr over 120 Minutes Intravenous  Once 11/08/18 1616 11/08/18 1941   11/08/18 1615  vancomycin (VANCOCIN) IVPB 1000 mg/200 mL premix  Status:  Discontinued  1,000 mg 200 mL/hr over 60 Minutes Intravenous  Once 11/08/18 1600 11/08/18 1616   11/08/18 1615  cefTRIAXone (ROCEPHIN) 2 g in sodium chloride 0.9 % 100 mL IVPB     2 g 200 mL/hr over 30 Minutes Intravenous  Once 11/08/18 1600 11/08/18 1712     Subjective: Seen and examined at bedside and she is remained drowsy and difficult to arouse this morning.  Discussed with the nurse and she had just gotten 10 mg of p.o. hydrocodone.  No complaints overnight and patient denied any pain today but will fall back to sleep.  No other concerns or complaints at this time.  Objective: Vitals:   11/13/18 0800 11/13/18 1000 11/13/18 1100 11/13/18 1200  BP:   (!) 117/56   Pulse:  83 73   Resp:  (!) 23 17   Temp: 98 F (36.7 C)   97.8 F (36.6 C)  TempSrc: Oral   Oral  SpO2:  96% 97%   Weight:      Height:        Intake/Output Summary (Last 24 hours) at 11/13/2018 1519 Last data filed at 11/13/2018 0400 Gross per 24 hour  Intake 296.76 ml  Output 850 ml  Net -553.24 ml   Filed Weights   11/09/18 1254 11/10/18 0500 11/13/18 0400  Weight: 111.6 kg 110.3 kg 113 kg   Examination: Physical Exam:  Constitutional: Well-nourished, well-developed obese African-American female who is continued to be lethargic and somnolent again today.  She is slower to respond but nurse states that she had just gotten 10 mg of hydrocodone Eyes: Lids and conjunctive are normal.  Sclera anicteric ENMT: External ears nose appear normal.  Grossly normal hearing Neck: Appears supple with no JVD Respiratory: Diminished auscultation bilaterally no appreciable wheezing, rales, rhonchi.  Patient not tachypneic or using any accessory muscles to breathe but supplemental oxygen has been removed now  Cardiovascular: Regular rate and rhythm.  No appreciable murmurs, rubs or gallops.  Has trace lower extremity edema Abdomen: Soft, nontender, distended secondary body habitus. GU: Deferred Musculoskeletal: No contractures or cyanosis.  Left foot is not as painful Skin: Will not turn for me to view her buttocks given pain Neurologic: Cranial nerves II through XII grossly intact no appreciable focal deficits but she is slow to respond again Psychiatric: She remains drowsy and lethargic and remains slightly confused today.  Not as anxious but resting  Data Reviewed: I have personally reviewed following labs and imaging studies  CBC: Recent Labs  Lab 11/08/18 1533 11/09/18 0344 11/10/18 0233 11/11/18 0323 11/12/18 0700 11/13/18 0223  WBC 27.5* 21.8* 18.9* 20.8* 23.2* 21.8*  NEUTROABS 24.5*  --  16.4* 15.1* 19.3* 16.8*  HGB 11.4* 9.9* 8.7* 8.6* 8.4* 8.2*  HCT 35.0* 30.0* 28.0* 27.0* 26.2* 24.6*  MCV 83.5 83.8 85.9 84.9 83.2 83.7  PLT 285 249 251 222 261 694   Basic Metabolic Panel: Recent Labs  Lab 11/09/18 0344  11/10/18 0233  11/10/18 1821 11/10/18 2159 11/11/18 0323 11/12/18 0700 11/13/18 0223  NA 136  136   < > 135   < > 131* 131* 132* 133* 135  K 4.7  4.7   < > 4.9   < > 4.2 4.6 4.9 4.8 4.6  CL 110  109   < > 108   < > 104 104 105 104 108  CO2 17*  16*   < > 16*   < > 16* 14* 16* 17* 17*  GLUCOSE 119*  118*   < >  203*   < > 122* 127* 125* 173* 135*  BUN 105*  105*   < > 109*   < > 86* 110* 102* 99* 102*  CREATININE 3.48*  3.54*   < > 3.26*   < > 3.19* 3.17* 3.29* 3.15* 2.96*  CALCIUM 8.9  8.8*   < > 8.7*   < > 8.6* 8.2* 8.5* 8.5* 8.3*  MG 2.3  --  2.4  --   --   --  2.5* 2.5* 2.5*  PHOS 2.7  --  4.1  --   --   --  4.3 4.5 4.1   < > = values in this interval not displayed.   GFR: Estimated Creatinine Clearance: 19.6 mL/min (A) (by C-G formula based on SCr of 2.96 mg/dL (H)). Liver Function Tests: Recent Labs  Lab 11/10/18 0233 11/10/18 1512 11/11/18 0323  11/12/18 0700 11/13/18 0223  AST 47* 65* 52* 32 28  ALT 40 49* 47* 34 26  ALKPHOS 79 73 78 85 70  BILITOT 0.5 0.4 0.7 0.8 0.8  PROT 5.6* 5.3* 5.5* 6.1* 5.5*  ALBUMIN 2.1* 2.0* 2.0* 2.2* 2.0*   No results for input(s): LIPASE, AMYLASE in the last 168 hours. Recent Labs  Lab 11/12/18 1015  AMMONIA 18   Coagulation Profile: Recent Labs  Lab 11/08/18 2037  INR 1.3*   Cardiac Enzymes: Recent Labs  Lab 11/08/18 1856 11/08/18 2330 11/09/18 0554 11/09/18 1206  TROPONINI 0.09* 0.10* 0.08* 0.07*   BNP (last 3 results) No results for input(s): PROBNP in the last 8760 hours. HbA1C: Recent Labs    11/11/18 0323  HGBA1C 8.4*   CBG: Recent Labs  Lab 11/12/18 2115 11/12/18 2324 11/13/18 0345 11/13/18 0735 11/13/18 1130  GLUCAP 157* 137* 110* 92 130*   Lipid Profile: No results for input(s): CHOL, HDL, LDLCALC, TRIG, CHOLHDL, LDLDIRECT in the last 72 hours. Thyroid Function Tests: No results for input(s): TSH, T4TOTAL, FREET4, T3FREE, THYROIDAB in the last 72 hours. Anemia Panel: Recent Labs    11/12/18 1015  VITAMINB12 510   Sepsis Labs: Recent Labs  Lab 11/08/18 1856 11/08/18 2037 11/08/18 2330 11/09/18 0554 11/12/18 1015 11/13/18 0223  PROCALCITON  --  6.89  --   --  6.13 6.01  LATICACIDVEN 1.7 2.0* 2.9* 1.0  --   --     Recent Results (from the past 240 hour(s))  Blood Culture (routine x 2)     Status: None   Collection Time: 11/08/18  3:33 PM  Result Value Ref Range Status   Specimen Description   Final    BLOOD RIGHT ANTECUBITAL Performed at [facility], [place] [address] [place], [place] [address]    Special Requests   Final    BOTTLES DRAWN AEROBIC AND ANAEROBIC Blood Culture results may not be optimal due to an excessive volume of blood received in culture bottles Performed at [facility] [address], [place], [place] [address]    Culture   Final    NO GROWTH 5 DAYS Performed at [facility], [place] [address], [place], [place] [address]    Report Status 11/13/2018 FINAL  Final  Blood Culture (routine x 2)     Status: None   Collection Time: 11/08/18  3:41 PM  Result Value Ref Range Status   Specimen Description   Final    BLOOD LEFT ANTECUBITAL Performed at [facility] [address], [place], [place] [address]    Special Requests  Final    BOTTLES DRAWN AEROBIC AND ANAEROBIC Blood Culture results may not be optimal due to an excessive volume of blood received in culture bottles Performed at [facility] [address] [place], [place] [address]    Culture   Final    NO GROWTH 5 DAYS Performed at [facility], [place] [address] [place], [place] [address]    Report Status 11/13/2018 FINAL  Final  SARS Coronavirus 2 (CEPHEID- Performed in [facility] hospital lab), Hosp Order     Status: None   Collection Time: 11/08/18  4:07 PM  Result Value Ref Range Status   SARS Coronavirus 2 NEGATIVE NEGATIVE Final    Comment: (NOTE) If result is NEGATIVE SARS-CoV-2 target nucleic acids are NOT DETECTED. The SARS-CoV-2 RNA is generally detectable in upper and lower  respiratory specimens during the acute phase of infection. The lowest  concentration of SARS-CoV-2 viral copies this assay can detect is 250  copies / mL. A negative result does not preclude SARS-CoV-2 infection  and should not be used as the sole basis for treatment or other  patient management decisions.  A negative result may occur with  improper specimen collection / handling, submission of specimen other  than nasopharyngeal swab, presence of viral mutation(s) within the  areas targeted by this assay, and inadequate number of viral copies  (<250 copies / mL). A negative result must be combined with clinical  observations, patient history, and epidemiological information. If result is POSITIVE SARS-CoV-2 target nucleic acids are DETECTED. The SARS-CoV-2 RNA is generally  detectable in upper and lower  respiratory specimens dur ing the acute phase of infection.  Positive  results are indicative of active infection with SARS-CoV-2.  Clinical  correlation with patient history and other diagnostic information is  necessary to determine patient infection status.  Positive results do  not rule out bacterial infection or co-infection with other viruses. If result is PRESUMPTIVE POSTIVE SARS-CoV-2 nucleic acids MAY BE PRESENT.   A presumptive positive result was obtained on the submitted specimen  and confirmed on repeat testing.  While 2019 novel coronavirus  (SARS-CoV-2) nucleic acids may be present in the submitted sample  additional confirmatory testing may be necessary for epidemiological  and / or clinical management purposes  to differentiate between  SARS-CoV-2 and other Sarbecovirus currently known to infect humans.  If clinically indicated additional testing with an alternate test  methodology [id number]) is advised. The SARS-CoV-2 RNA is generally  detectable in upper and lower respiratory sp ecimens during the acute  phase of infection. The expected result is Negative. Fact Sheet for Patients:  StrictlyIdeas.no Fact Sheet for Healthcare Providers: BankingDealers.co.za This test is not yet approved or cleared by the [place] FDA and has been authorized for detection and/or diagnosis of SARS-CoV-2 by FDA under an Emergency Use Authorization (EUA).  This EUA will remain in effect (meaning this test can be used) for the duration of the COVID-19 declaration under Section 564(b)(1) of the Act, 21 U.S.C. section 360bbb-3(b)(1), unless the authorization is terminated or revoked sooner. Performed at [facility], [place] [address] [place], [place] [address]   MRSA PCR Screening     Status: None   Collection Time: 11/08/18  8:54 PM  Result Value Ref Range Status   MRSA by PCR NEGATIVE  NEGATIVE Final    Comment:        The GeneXpert MRSA Assay (FDA approved for NASAL specimens only), is one component of  a comprehensive MRSA colonization surveillance program. It is not intended to diagnose MRSA infection nor to guide or monitor treatment for MRSA infections. Performed at [facility], [place] [address] [place], [place] [address]   Aerobic/Anaerobic Culture (surgical/deep wound)     Status: None (Preliminary result)   Collection Time: 11/09/18  2:06 PM  Result Value Ref Range Status   Specimen Description   Final    ABSCESS LEFT BUTTOCKS Performed at [place] [address], [place], [place] [address]    Special Requests   Final    NONE Performed at [facility], [place] [address] [place], [place] [address]    Gram Stain   Final    ABUNDANT WBC PRESENT,BOTH PMN AND MONONUCLEAR ABUNDANT GRAM POSITIVE COCCI ABUNDANT GRAM NEGATIVE RODS    Culture   Final    FEW ESCHERICHIA COLI FEW BACTEROIDES SPECIES BETA LACTAMASE POSITIVE Performed at [facility], [place] [address] [place], [place] [address]    Report Status PENDING  Incomplete   Organism ID, Bacteria ESCHERICHIA COLI  Final      Susceptibility   Escherichia coli - MIC*    AMPICILLIN <=2 SENSITIVE Sensitive     CEFAZOLIN <=4 SENSITIVE Sensitive     CEFEPIME <=1 SENSITIVE Sensitive     CEFTAZIDIME <=1 SENSITIVE Sensitive     CEFTRIAXONE <=1 SENSITIVE Sensitive     CIPROFLOXACIN <=0.25 SENSITIVE Sensitive     GENTAMICIN <=1 SENSITIVE Sensitive     IMIPENEM <=0.25 SENSITIVE Sensitive     TRIMETH/SULFA <=20 SENSITIVE Sensitive     AMPICILLIN/SULBACTAM <=2 SENSITIVE Sensitive     PIP/TAZO <=4 SENSITIVE Sensitive     Extended ESBL NEGATIVE Sensitive     * FEW ESCHERICHIA COLI  Urine Culture     Status: None   Collection Time: 11/10/18 12:30 PM  Result Value Ref Range Status   Specimen Description   Final    URINE, RANDOM Performed at  [place] [address] [place], [place] [address]    Special Requests   Final    NONE Performed at [facility], [place] [address] [place], [place] [address]    Culture   Final    NO GROWTH Performed at [facility], [place] [address] [place], [place] [address]    Report Status 11/11/2018 FINAL  Final    Radiology Studies: Ct Head Wo Contrast  Result Date: 11/12/2018 CLINICAL DATA:  Neurologic deficit.  Altered mental status. EXAM: CT HEAD WITHOUT CONTRAST TECHNIQUE: Contiguous axial images were obtained from the base of the skull through the vertex without intravenous contrast. COMPARISON:  PET-CT 06/12/2015 FINDINGS: Brain: No acute intracranial hemorrhage. No focal mass lesion. No CT evidence of acute infarction. No midline shift or mass effect. No hydrocephalus. Basilar cisterns are patent. Mild subcortical white matter hypodensities. Mild atrophy and ventricular dilatation. Findings not changed from prior. Vascular: No hyperdense vessel or unexpected calcification. Skull: Normal. Negative for fracture or focal lesion. Sinuses/Orbits: Paranasal sinuses and mastoid air cells are clear. Orbits are clear. Other: None. IMPRESSION: 1. No acute intracranial findings. 2. Mild atrophy and white matter microvascular disease. Electronically Signed   By: Suzy Bouchard M.D.   On: 11/12/2018 11:19   Dg Chest Port 1 View  Result Date: 11/13/2018 CLINICAL DATA:  78 year old female with shortness of breath. Negative for COVID-19 on 11/08/2018. EXAM: PORTABLE CHEST 1 VIEW COMPARISON:  11/12/2018 and earlier. FINDINGS: Portable AP semi upright  view at 0354 hours. Continued low lung volumes with platelike and streaky opacity in the right mid and lower lung. No pneumothorax, pulmonary edema, pleural effusion or other confluent opacity. Mediastinal contours are stable and within normal limits. Visualized tracheal air column is within normal limits. Visible bowel  gas pattern is within normal limits. No acute osseous abnormality identified. IMPRESSION: Continued low lung volumes and streaky right mid and lower lung opacity since yesterday, indeterminate for atelectasis versus infection. Electronically Signed   By: Genevie Ann M.D.   On: 11/13/2018 05:52   Dg Chest Port 1 View  Result Date: 11/12/2018 CLINICAL DATA:  Shortness of breath. EXAM: PORTABLE CHEST 1 VIEW COMPARISON:  Radiograph of Nov 08, 2018. FINDINGS: The heart size and mediastinal contours are within normal limits. No pneumothorax or pleural effusion is noted. Left lung is clear. Minimal right basilar subsegmental atelectasis is noted. The visualized skeletal structures are unremarkable. IMPRESSION: Minimal right basilar subsegmental atelectasis. Electronically Signed   By: Marijo Conception M.D.   On: 11/12/2018 11:41   Dg Foot 2 Views Left  Result Date: 11/12/2018 CLINICAL DATA:  Acute left foot pain. EXAM: LEFT FOOT - 2 VIEW COMPARISON:  None. FINDINGS: There is no evidence of fracture or dislocation. There is no evidence of arthropathy or other focal bone abnormality. Soft tissues are unremarkable. IMPRESSION: Negative. Electronically Signed   By: Marijo Conception M.D.   On: 11/12/2018 11:39   Scheduled Meds: . allopurinol  300 mg Oral Daily  . amLODipine  2.5 mg Oral Daily  . aspirin EC  81 mg Oral Daily  . calcitRIOL  0.25 mcg Oral Daily  . Chlorhexidine Gluconate Cloth  6 each Topical Daily  . cloNIDine  0.1 mg Oral BID  . insulin aspart  0-15 Units Subcutaneous Q4H  . insulin aspart  6 Units Subcutaneous TID WC  . insulin glargine  15 Units Subcutaneous Daily  . levothyroxine  50 mcg Oral Q0600  . mouth rinse  15 mL Mouth Rinse BID  . metoprolol succinate  50 mg Oral Daily  . metroNIDAZOLE  500 mg Oral Q8H  . pravastatin  80 mg Oral QHS  . sodium bicarbonate  650 mg Oral QID   Continuous Infusions: . ceFEPime (MAXIPIME) IV Stopped (11/13/18 0914)    LOS: 5 days   Kerney Elbe, DO [facility] PAGER is on [place]  If 7PM-7AM, please contact night-coverage [email or web address] Password [facility] 11/13/2018, 3:19 PM

## 2018-11-14 ENCOUNTER — Inpatient Hospital Stay (HOSPITAL_COMMUNITY): Payer: PPO

## 2018-11-14 LAB — RETICULOCYTES
Immature Retic Fract: 19 % — ABNORMAL HIGH (ref 2.3–15.9)
RBC.: 3.24 MIL/uL — ABNORMAL LOW (ref 3.87–5.11)
Retic Count, Absolute: 51.5 10*3/uL (ref 19.0–186.0)
Retic Ct Pct: 1.6 % (ref 0.4–3.1)

## 2018-11-14 LAB — CBC WITH DIFFERENTIAL/PLATELET
Abs Immature Granulocytes: 2.29 10*3/uL — ABNORMAL HIGH (ref 0.00–0.07)
Basophils Absolute: 0.2 10*3/uL — ABNORMAL HIGH (ref 0.0–0.1)
Basophils Relative: 1 %
Eosinophils Absolute: 0.2 10*3/uL (ref 0.0–0.5)
Eosinophils Relative: 1 %
HCT: 27.1 % — ABNORMAL LOW (ref 36.0–46.0)
Hemoglobin: 8.7 g/dL — ABNORMAL LOW (ref 12.0–15.0)
Immature Granulocytes: 9 %
Lymphocytes Relative: 10 %
Lymphs Abs: 2.6 10*3/uL (ref 0.7–4.0)
MCH: 26.9 pg (ref 26.0–34.0)
MCHC: 32.1 g/dL (ref 30.0–36.0)
MCV: 83.6 fL (ref 80.0–100.0)
Monocytes Absolute: 1.3 10*3/uL — ABNORMAL HIGH (ref 0.1–1.0)
Monocytes Relative: 5 %
Neutro Abs: 18.2 10*3/uL — ABNORMAL HIGH (ref 1.7–7.7)
Neutrophils Relative %: 74 %
Platelets: 314 10*3/uL (ref 150–400)
RBC: 3.24 MIL/uL — ABNORMAL LOW (ref 3.87–5.11)
RDW: 15.2 % (ref 11.5–15.5)
WBC: 24.6 10*3/uL — ABNORMAL HIGH (ref 4.0–10.5)
nRBC: 0.2 % (ref 0.0–0.2)

## 2018-11-14 LAB — GLUCOSE, CAPILLARY
Glucose-Capillary: 109 mg/dL — ABNORMAL HIGH (ref 70–99)
Glucose-Capillary: 132 mg/dL — ABNORMAL HIGH (ref 70–99)
Glucose-Capillary: 137 mg/dL — ABNORMAL HIGH (ref 70–99)
Glucose-Capillary: 145 mg/dL — ABNORMAL HIGH (ref 70–99)
Glucose-Capillary: 166 mg/dL — ABNORMAL HIGH (ref 70–99)
Glucose-Capillary: 91 mg/dL (ref 70–99)

## 2018-11-14 LAB — COMPREHENSIVE METABOLIC PANEL
ALT: 25 U/L (ref 0–44)
AST: 27 U/L (ref 15–41)
Albumin: 2 g/dL — ABNORMAL LOW (ref 3.5–5.0)
Alkaline Phosphatase: 75 U/L (ref 38–126)
Anion gap: 10 (ref 5–15)
BUN: 98 mg/dL — ABNORMAL HIGH (ref 8–23)
CO2: 17 mmol/L — ABNORMAL LOW (ref 22–32)
Calcium: 8.5 mg/dL — ABNORMAL LOW (ref 8.9–10.3)
Chloride: 111 mmol/L (ref 98–111)
Creatinine, Ser: 2.7 mg/dL — ABNORMAL HIGH (ref 0.44–1.00)
GFR calc Af Amer: 19 mL/min — ABNORMAL LOW (ref >60)
GFR calc non Af Amer: 16 mL/min — ABNORMAL LOW (ref >60)
Glucose, Bld: 102 mg/dL — ABNORMAL HIGH (ref 70–99)
Potassium: 4.6 mmol/L (ref 3.5–5.1)
Sodium: 138 mmol/L (ref 135–145)
Total Bilirubin: 0.6 mg/dL (ref 0.3–1.2)
Total Protein: 6.1 g/dL — ABNORMAL LOW (ref 6.5–8.1)

## 2018-11-14 LAB — IRON AND TIBC
Iron: 33 ug/dL (ref 28–170)
Saturation Ratios: 29 % (ref 10.4–31.8)
TIBC: 115 ug/dL — ABNORMAL LOW (ref 250–450)
UIBC: 82 ug/dL

## 2018-11-14 LAB — PHOSPHORUS: Phosphorus: 4.2 mg/dL (ref 2.5–4.6)

## 2018-11-14 LAB — PROCALCITONIN: Procalcitonin: 4.82 ng/mL

## 2018-11-14 LAB — MAGNESIUM: Magnesium: 2.5 mg/dL — ABNORMAL HIGH (ref 1.7–2.4)

## 2018-11-14 LAB — FERRITIN: Ferritin: 1253 ng/mL — ABNORMAL HIGH (ref 11–307)

## 2018-11-14 LAB — VITAMIN B12: Vitamin B-12: 548 pg/mL (ref 180–914)

## 2018-11-14 LAB — FOLATE: Folate: 12.7 ng/mL (ref >5.9)

## 2018-11-14 MED ORDER — SODIUM CHLORIDE 0.9 % IV SOLN
3.0000 g | Freq: Two times a day (BID) | INTRAVENOUS | Status: AC
  Administered 2018-11-14 – 2018-11-19 (×11): 3 g via INTRAVENOUS
  Filled 2018-11-14 (×11): qty 3

## 2018-11-14 NOTE — Progress Notes (Signed)
Pharmacy Antibiotic Note  Johnye Kist is a 78 y.o. female admitted on 11/08/2018 with cellulitis/abscess of buttocks.  Pharmacy has been consulted for Unasyn dosing  11/14/2018: D#6 abx Abscess culture with pan sensitive E coli and bacteroides (B-lactamase +)  Plan:  Based on abscess cx result, use target antibiotic therapy per C&S results, change to ampicillin/sulb 3gm IV q12h adjust for renal fx  Pharmacy to monitor dosing and intervene as necessary to adjust dose for change in renal fx  Height: 5\' 3"  (160 cm) Weight: 249 lb 1.9 oz (113 kg) IBW/kg (Calculated) : 52.4  Temp (24hrs), Avg:98.2 F (36.8 C), Min:97.9 F (36.6 C), Max:98.6 F (37 C)  Recent Labs  Lab 11/08/18 1533 11/08/18 1856 11/08/18 2037 11/08/18 2330  11/09/18 0554  11/10/18 0233  11/10/18 2159 11/11/18 0323 11/12/18 0700 11/13/18 0223 11/14/18 0245  WBC 27.5*  --   --   --    < >  --   --  18.9*  --   --  20.8* 23.2* 21.8* 24.6*  CREATININE 4.76*  --  4.22* 3.90*   < > 3.43*   < > 3.26*   < > 3.17* 3.29* 3.15* 2.96* 2.70*  LATICACIDVEN 2.8* 1.7 2.0* 2.9*  --  1.0  --   --   --   --   --   --   --   --    < > = values in this interval not displayed.    Estimated Creatinine Clearance: 21.4 mL/min (A) (by C-G formula based on SCr of 2.7 mg/dL (H)).    Allergies  Allergen Reactions  . Ace Inhibitors   . Actos [Pioglitazone Hydrochloride]   . Dust Mite Extract   . Dye Fdc Red [Erythrosine Red No. 3]    Antimicrobials this admission:  5/6 vanc>> 5/6 CTX x 1 5/7 flagyl>> 5/7 cefepime>>  Dose adjustments this admission:   Microbiology results:  5/6 MRSA PCR neg 5/6 SARS Cov2: neg 5/6 BCx2> ngtd 5/7 abscess L buttocks: pan sensitive E coli, bacteroides (BL +) 5/8 Ucx>NG  Thank you for allowing pharmacy to be a part of this patient's care.  Doreene Eland, PharmD, BCPS.   Work Cell: [phone] 11/14/2018 12:25 PM

## 2018-11-14 NOTE — Progress Notes (Signed)
HYDROTHERAPY TREATMENT     11/14/18 1300  Subjective Assessment  Subjective "i hurt"  Date of Onset  (abscess present on admission)  Prior Treatments s/p I&D 5/7  Evaluation and Treatment  Evaluation and Treatment Procedures Explained to Patient/Family Yes  Evaluation and Treatment Procedures agreed to  Wound / Incision (Open or Dehisced) 11/13/18 Incision - Open Buttocks Left L buttocks wound s/p I&D by Surgery 11/09/18. *HYDRO/PT*  Date First Assessed/Time First Assessed: 11/13/18 1500   Wound Type: Incision - Open  Location: Buttocks  Location Orientation: Left  Wound Description (Comments): L buttocks wound s/p I&D by Surgery 11/09/18. *HYDRO/PT*  Dressing Type ABD;Barrier Film (skin prep);Impregnated gauze (petrolatum);Gauze (Comment);Moist to dry  Dressing Changed Changed  Dressing Status Old drainage  Dressing Change Frequency Twice a day  % Wound base Red or Granulating 50%  % Wound base Yellow/Fibrinous Exudate 25%  % Wound base Black/Eschar 25%  Peri-wound Assessment Excoriated;Bleeding;Pink  Margins Unattached edges (unapproximated)  Drainage Amount Moderate  Drainage Description Serosanguineous  Treatment Debridement (Selective);Hydrotherapy (Pulse lavage);Packing (Saline gauze)  Hydrotherapy  Pulsed lavage therapy - wound location L buttocks  Pulsed Lavage with Suction (psi) 8 psi  Pulsed Lavage with Suction - Normal Saline Used 1000 mL  Pulsed Lavage Tip Tip with splash shield  Selective Debridement  Selective Debridement - Location L buttock  Selective Debridement - Tools Used Forceps;Scissors  Selective Debridement - Tissue Removed brown/black tissue and yellow slough  Wound Therapy - Assess/Plan/Recommendations  Wound Therapy - Clinical Statement 78 yo female admitted with L gluteal/buttock abscess. S/P I&D 11/09/18.   Factors Delaying/Impairing Wound Healing Diabetes Mellitus;Multiple medical problems;Immobility  Hydrotherapy Plan Debridement;Dressing  change;Patient/family education;Pulsatile lavage with suction  Wound Therapy - Frequency 6X / week  Wound Therapy - Current Recommendations Case manager/social work  Wound Therapy - Follow Up Recommendations Skilled nursing facility;Home health RN  Wound Plan Will continue hydrotherapy and dressing changes 6x/week. Pt requires pre-medication 2* significant pain with wound care/dressing changes  Wound Therapy Goals - Improve the function of patient's integumentary system by progressing the wound(s) through the phases of wound healing by:  Decrease Necrotic Tissue to 30  Decrease Necrotic Tissue - Progress Progressing toward goal  Increase Granulation Tissue to 70  Increase Granulation Tissue - Progress Progressing toward goal  Goals/treatment plan/discharge plan were made with and agreed upon by patient/family Yes  Time For Goal Achievement 2 weeks  Wound Therapy - Potential for Goals Good     Weston Anna, PT Acute Rehabilitation Services Pager: [phone] Office: [phone]

## 2018-11-14 NOTE — Progress Notes (Addendum)
PROGRESS NOTE    Emily Hughes  KDX:[id number] DOB: March 20, 1941 DOA: 11/08/2018 PCP: Susy Frizzle, MD   Brief Narrative:  HPI per Dr. Toy Baker on 11/08/2018  Patient is a 78 year old African-American female with a past medical history significant for but not limited to diabetes mellitus type 2, CKD stage IV, hypothyroidism, CAD, GERD, hypertension, hyperlipidemia, history of seizure disorder, ? Atrial fibrillation, long with comorbidities who presented to the emergency room with a 3-day history of pain and swelling in her buttocks along with a fever of 102.  Family reportedly gave her some Tylenol that helped her improve her temperature down to 99 but she is found to have elevated blood pressures for last few days when in the 400s.  She has not been taking her insulin for last 2 days and has not been checking her glucose regularly.  On admission she stated that the left buttock pain lasted for last few days and was severe 10 out of 10 and reportedly was a boil that then progressed.  She was worked up and admitted for sepsis secondary to abscess of the left buttock as well as DKA.  She was placed in the stepdown unit and was placed on glucose stabilizer and has improvement in her blood sugars.  She was incidentally noted to be in atrial fibrillation/A Flutter which then converted to NSR a few days ago.  Cardiology was consulted for preoperative clearance and patient was taken for incision and drainage of her abscess on her left buttock and she is POD2. DKA improved and she was transitioned to Long Acting Insulin even though Her CO2 is not >20 given that she has chronic Metabolic Acidosis 2/2 to her Renal Disease and is on Sodium Bicarbonate. She tolerated her Diet without issues but complained of some Abdominal Pain.   Liver function tests were elevated and right upper quadrant ultrasound was done and showed possible acute cholecystitis.  I discussed with general surgery Dr. Jens Som  who recommends obtaining a HIDA scan however HIDA scan with EF is not able to be done at this time and if EF is needed to be done will come will be on Tuesday per nursing. So will obtain a HIDA scan without the EF currently.  And this HIDA scan was normal hepatobiliary scan so Dr. Windle Guard was less inclined for acute cholecystitis and felt that her LFTs being elevated could be reactionary.  We will continue to monitor closely and antibiotics have been de-escalated to just IV cefepime Flagyl for now and will continue to de-escalate accordingly.  Echocardiogram was done yesterday.  On 11/12/2018 the patient was lethargic and slower to respond.  Obtained an ABG which showed some mild hypoxemia and ammonia level was 18.  Head CT was done and showed no acute intracranial findings and mild atrophy and white matter microvascular disease.  WBC is slowly trending up so repeat cultures were ordered including a chest x-ray, blood cultures, urinalysis.  Chest x-ray showed minimal bibasilar segment atelectasis.  Patient was complaining of left foot pain so foot x-ray was also obtained findings.  Patient's somnolence does not improve will obtain a neurologic consult in the a.m.  Repeat urinalysis showed cloudy urine and small hemoglobin along with moderate leukocytes, few bacteria and 11-20 BCs.  She is currently on antibiotics with IV cefepime.  Yesterday remained somnolent (received 10 mg of po Hydrocodone this AM) so narcotics have been adjusted and IV morphine has been discontinued along with p.o. hydrocodone.  We will continue to  monitor and white blood cell count is now trending down but in general surgery is recommending hydrotherapy but no further surgical intervention.  Today 11/14/2018 was still a little lethargic but improving from yesterday.  Patient is going to start hydrotherapy and antibiotics have been further adjusted from IV cefepime to IV Unasyn.  White blood cell count remains somewhat elevated but could be  secondary to pain.  We will continue monitor patient mental status given her changes in her opiate regimen and may need to adjust further.  If she still remains lethargic and somnolent we will consider an MRI in the a.m.  Assessment & Plan:   Active Problems:   Essential hypertension   CAD (coronary artery disease)   GERD (gastroesophageal reflux disease)   DKA (diabetic ketoacidoses) (HCC)   Chronic kidney disease (CKD), stage IV (severe) (HCC)   Abscess of left buttock   Sepsis (HCC)   Atrial fibrillation with RVR (HCC)   Elevated troponin   Abnormal LFTs  Sepsis secondary to the abscess of the left buttock s/p I and D of Abscess POD 5, improving -Patient met sepsis criteria with a fever, leukocytosis, and tachycardia (Atrial Fibrillation).  Leukocytosis now trending down -This protocol initiated in the ED and patient was given 30 mL's per KG doses -Sepsis physiology is improving but white blood cell count bumped slightly again and is now trended up to 24.6 -Lactic acid level is now improved -CT Pelvis scan showed "Edema and gas identified in the subcutaneous fat of the medial left gluteal region without associated abscess. Imaging features compatible with soft tissue infection/cellulitis/" -Patient was placed on broad-spectrum antibiotics including vancomycin and IV cefepime as well as metronidazole to cover anaerobes. IV Vancomycin now discontinued  -Procalcitonin level was 6.89 and trended down to 4.82 and lactic acid trended down from 2.9 is now 1.0 -WBC went from 27.5 -> 21.8 -> 18.9 -> 20.8 -> 23.2 -> 21.8 -> 24.6 -SARS-CoV-2 Negative  -General Surgery was consulted for further evaluation and patient was rehydrated and treated for her medical complications -Since blood sugars are better controlled today patient will be going for surgery and cardiology is evaluated for preoperative clearance next -Continue monitor temperature curve and cultures; Blood Cx x2 showed NGTD at 5 Days   -Aerobic/Anaerobic Gram Stain showed: Gram Stain ABUNDANT WBC PRESENT,BOTH PMN AND MONONUCLEAR  ABUNDANT GRAM POSITIVE COCCI  ABUNDANT GRAM NEGATIVE RODS   Culture FEW ESCHERICHIA COLI FEW BACTEROIDES SPESIECES BETA LACTAMASE POSITIVE CULTURE WAS REINCUBATED FOR BETTER GROWTH   -E Coli was Pan-sensitive so will narrow Abx to just IV Cefepime and Flagyl; Flagyl has been changed to po; NOW ABx further adjusted to IV Unasyn  -MRSA negative  -Unclear why patient was placed on contact precautions however there is no evidence that the patient needs to be a contact precautions and will be discontinued -Repeat urinalysis given patient's somnolence and lethargy today and showed patient with small hemoglobin, moderate leukocytes, negative nitrites, few bacteria, 0-5 9 squamous epithelial cells, as well as 11-20 WBCs.  Patient is currently on IV cefepime and IV Flagyl and will continue. -Repeat chest x-ray done 11/12/2018 showed no pneumonia but did show bibasilar atelectasis. Today X-Ray showed "Mildly improved lung volumes. Residual lung base opacity which more resembles atelectasis today. No new cardiopulmonary abnormality." -General Surgery Recommending BID Wet to dry Dressing Changes; General Surgery recommending Hydrotherapy and this is to start today  -Repeat CBC  In AM   DKA in the setting of Diabetes Mellitus Type 2,  improved -Improved and off of Glucose-Stabilizer -CO2 this AM was again 17  and AG was 10  -On admission patient had a Beta-Hydroxybutyric acid of 2.29 -Continue antibiotics as above -Patient was likely in DKA secondary to infection from left buttock abscess -Transitioned to 15 units of Lantus Daily, Start on Moderate Novolog SSI q4h, and 6 units TIDwm -We will need diabetes education coordinator to follow and appreciate input  -HbA1c as 8.4 -CBGs have been ranging from 98-166 -Continue to Monitor CBG's carefully   Essential Hypertension -Resume home medications when able to;   -C/w Home Metoprolol XL 25 mg po Daily  -She was started on a Cardizem drip and p.o. diltiazem has now been discontinued but she converted to NSR and Cardizem gtt was stopped and She was started on Cardizem 120 mg po Daily; This was held due to Bradycardia has now been stopped -We will continue Clonidine 0.1 mg p.o. twice daily -Need to monitor closely and adjust medication as accordingly -BP improved and was 146/69 this AM -Cardiology added 2.5 mg p.o. Amlodipine  CAD -Resumed ASA 81 mg po Daily  -We will continue Metoprolol -Patient is currently chest pain-free -Cardiology is evaluating and feels no ischemic work-up is needed currently in recommend resuming home Toprol 25 mg p.o. daily along with pravastatin and aspirin after her procedures done  Elevated Troponin -Denies any chest pain and there is no ischemic changes on EKG -Likely in the setting of CKD and worsening renal clearance -Likely demand ischemia in the setting of sepsis -Continue to cycle cardiac enzymes have been trending down and went from 0.09 -> 0.10 -> 0.08 -> 0.07 -Echocardiogram has been ordered and pending to be done  AKI on CKD stage IV, improving  Metabolic Acidosis, stable  -Patient presented with a BUN/creatinine of 125/4.76 is now improving and is now 98/2.70 -IVF now D/C'd  -Avoid Nephrotoxic medications, contrast dyes, as well as hypertension if possible -Continue with Calcitriol -Continue with sodium bicarb 650 mg p.o. 4 times daily -Continue monitor and trend renal function -There is been discussions for possible need for dialysis in the future however patient is continually declined -Repeat CMP in a.m.  Atrial fibrillation/flutter with RVR versus MAT; Cardiology feels this is atrial flutter with variable block s/p Conversion to NSR with transient 3rd Degree AVB, -Cardiology was consulted feels this is new in the setting of sepsis -Echo has been ordered along with TSH and cardiac enzymes are being  cycled; Echo still isn't done  -Patient was started on a Cardizem drip and p.o. Cardizem was held (Takes 300 mg po Daily at home);  -Since she converted to NSR l Cardizem gtt stopped and she was started on Cardizem CD 120 mg Daily but this was held due to mild Bradycardia and stopped due to AVB and Dr. Meda Coffee believes its from AVN Blocking drugs and Sleep Apnea -Cardizem CD 120 mg po Daily has now been D/C'd  -Patient will be restarted on metoprolol XL 25 mg p.o. daily -Cardiology feels there is no need for anticoagulation at this time given her short duration need for surgical intervention -We will continue monitor closely on telemetry for recurrence -Checked ECHOCardiogram and showed the left ventricle has hyperdynamic systolic function, with an ejection fraction of >65%. The cavity size was normal. There is moderate concentric left ventricular hypertrophy. Left ventricular diastolic Doppler parameters are indeterminate.  Hypothyroidism -Checked TSH and was 1.815 -Continue levothyroxine 50 mcg p.o. daily  Morbid Obesity -Estimated body mass index is 44.13 kg/m  as calculated from the following:   Height as of this encounter: 5' 3"  (1.6 m).   Weight as of this encounter: 113 kg. -Weight Loss and Dietary Counseling given   History of Gout -Currently colchicine is being held but will resume allopurinol 300 mg p.o. daily  Abnormal LFT's, improving  -? Reactive or Medication Induced -AST is now 27 and ALT is now 25  -Check RUQ U/S and Acute Hepatitis Panel; Acute Hepatitis Panel Negative   -RUQ U/S Showed Gallbladder sludge and positive sonographic Murphy's sign.No biliary duct dilatation -Ordered HIDA and was it was normal -Discussed with General Surgery and they recommended no further intervention  -Continue to Monitor and Trend Hepatic Function -Repeat CMP in AM  Hyponatremia -Patient's Na+ is now 138 -IVF now D/C'd  -Continue to Monitor and Trend -Repeat CMP in AM   Normocytic  Anemia/Anemia of Chronic Kidney Disease -Patient's Hb/Hct went from 11.4/35.0 -> 8.7/27.1 -Likely also has a Dilutional Drop and Post-operative drop -Checked Anemia Panel showed iron level of 33, U IBC of 82, TIBC of 115, saturation ratios of 29%, ferritin level 1253, folate level 12.7, vitamin B12 level 548 -Continue to Monitor for S/Sx of Bleeding -Repeat CMP in AM   Hypermagnesemia -Mild at 2.5 -Continue to Monitor and Repeat Mag Level in AM   Somnolence and Lethargy, slightly improved but still there -Unclear etiology but patient started to respond and did not respond fully but likely this is Medication induced  -Checked head CT and it was negative -Obtain ABG and she was mildly hypoxemic -Ammonia level was 18 -We will place on delirium precautions -Continue to treat infection and WBC started to go up but will not need a repeat ID  -Remain in the stepdown unit for now given her somnolence -Repeat cultures and repeat CXR in AM  -We will need to limit sedating medications and she receives hydrocodone-acetaminophen 1 to 2 tablets p.o. every 4 PRN for moderate pain and IV morphine 2 to 3 mg IV every 2 PRN for severe pain will cut back on this; Discontinued IV Morphine altogether and changed po Hydrocodone-Acetaminophen to po Oxycodone -Discussed with nurse and she will receive morphine when she gets her dressing changes but this will be changed -Continue monitor very carefully -If not improving even with reducing Sedating medications may need MRI but will first obtain EEG -Continue to Monitor   DVT prophylaxis: SCDs Code Status: FULL CODE Family Communication: No family present at bedside Disposition Plan: Remain In SDU given Somnolence and Drowsiness and transfer to General Medical Floor if improving   Consultants:   General Surgery  Cardiology   None  Procedures:  I and D of Left Buttock and Perinanal Abscess done by Dr. Ninfa Linden on 11/09/2018  ECHOCARDIOGRAM IMPRESSIONS     1. The left ventricle has hyperdynamic systolic function, with an ejection fraction of >65%. The cavity size was normal. There is moderate concentric left ventricular hypertrophy. Left ventricular diastolic Doppler parameters are indeterminate.  2. The right ventricle has normal systolic function. The cavity was normal. There is no increase in right ventricular wall thickness.  3. Left atrial size was mildly dilated.  4. The pericardial effusion is posterior to the left ventricle.  5. Trivial pericardial effusion is present.  6. The mitral valve is grossly normal. No evidence of mitral valve stenosis.  7. The tricuspid valve is grossly normal.  8. The aortic valve is tricuspid. Mild thickening of the aortic valve. No stenosis of the aortic valve.  FINDINGS  Left Ventricle: The left ventricle has hyperdynamic systolic function, with an ejection fraction of >65%. The cavity size was normal. There is moderate concentric left ventricular hypertrophy. Left ventricular diastolic Doppler parameters are  indeterminate.  Right Ventricle: The right ventricle has normal systolic function. The cavity was normal. There is no increase in right ventricular wall thickness.  Left Atrium: Left atrial size was mildly dilated.  Right Atrium: Right atrial size was normal in size.  Interatrial Septum: No atrial level shunt detected by color flow Doppler.  Pericardium: Trivial pericardial effusion is present. The pericardial effusion is posterior to the left ventricle.  Mitral Valve: The mitral valve is grossly normal. Mitral valve regurgitation is mild by color flow Doppler. No evidence of mitral valve stenosis.  Tricuspid Valve: The tricuspid valve is grossly normal. Tricuspid valve regurgitation is mild by color flow Doppler.  Aortic Valve: The aortic valve is tricuspid Mild thickening of the aortic valve. Aortic valve regurgitation was not visualized by color flow Doppler. There is No stenosis  of the aortic valve.  Pulmonic Valve: The pulmonic valve was not assessed. Pulmonic valve regurgitation was not assessed by color flow Doppler.  Venous: The inferior vena cava is normal in size with greater than 50% respiratory variability.    +--------------+-------++ LEFT VENTRICLE        +----------------+---------++ +--------------+-------++ Diastology                PLAX 2D               +----------------+---------++ +--------------+-------++ LV e' lateral:  4.13 cm/s LVIDd:        5.28 cm +----------------+---------++ +--------------+-------++ LV E/e' lateral:11.4      LVIDs:        3.81 cm +----------------+---------++ +--------------+-------++ LV e' medial:   3.70 cm/s LV PW:        1.17 cm +----------------+---------++ +--------------+-------++ LV E/e' medial: 12.7      LV IVS:       1.40 cm +----------------+---------++ +--------------+-------++ LV SV:        72 ml   +--------------+-------++ LV SV Index:  31.56   +--------------+-------++                       +--------------+-------++  +-----------+-------++----------++ LEFT ATRIUM       Index      +-----------+-------++----------++ LA diam:   4.40 cm2.09 cm/m +-----------+-------++----------++    +-------------+-------++ AORTA                +-------------+-------++ Ao Root diam:3.40 cm +-------------+-------++  +--------------+----------++ MITRAL VALVE             +--------------+----------++ MV Area (PHT):2.42 cm   +--------------+----------++ MV PHT:       91.06 msec +--------------+----------++ MV Decel Time:314 msec   +--------------+----------++ +--------------+----------++ MV E velocity:47.10 cm/s +--------------+----------++ MV A velocity:62.10 cm/s +--------------+----------++ MV E/A ratio: 0.76       +--------------+----------++   Antimicrobials:  Anti-infectives (From  admission, onward)   Start     Dose/Rate Route Frequency Ordered Stop   11/14/18 2200  Ampicillin-Sulbactam (UNASYN) 3 g in sodium chloride 0.9 % 100 mL IVPB     3 g 200 mL/hr over 30 Minutes Intravenous Every 12 hours 11/14/18 1219     11/13/18 2200  metroNIDAZOLE (FLAGYL) tablet 500 mg  Status:  Discontinued     500 mg Oral Every 8 hours 11/13/18 1356 11/14/18 1218   11/10/18 2200  vancomycin (VANCOCIN) IVPB 750 mg/150  ml premix  Status:  Discontinued     750 mg 150 mL/hr over 60 Minutes Intravenous Every 48 hours 11/08/18 2126 11/11/18 1451   11/09/18 1000  ceFEPIme (MAXIPIME) 2 g in sodium chloride 0.9 % 100 mL IVPB  Status:  Discontinued     2 g 200 mL/hr over 30 Minutes Intravenous Every 24 hours 11/09/18 0901 11/14/18 1218   11/08/18 2200  ceFEPIme (MAXIPIME) 2 g in sodium chloride 0.9 % 100 mL IVPB  Status:  Discontinued     2 g 200 mL/hr over 30 Minutes Intravenous Every 24 hours 11/08/18 2126 11/09/18 0901   11/08/18 2030  metroNIDAZOLE (FLAGYL) IVPB 500 mg  Status:  Discontinued     500 mg 100 mL/hr over 60 Minutes Intravenous Every 8 hours 11/08/18 2022 11/13/18 1356   11/08/18 1645  vancomycin (VANCOCIN) 2,500 mg in sodium chloride 0.9 % 500 mL IVPB     2,500 mg 250 mL/hr over 120 Minutes Intravenous  Once 11/08/18 1616 11/08/18 1941   11/08/18 1615  vancomycin (VANCOCIN) IVPB 1000 mg/200 mL premix  Status:  Discontinued     1,000 mg 200 mL/hr over 60 Minutes Intravenous  Once 11/08/18 1600 11/08/18 1616   11/08/18 1615  cefTRIAXone (ROCEPHIN) 2 g in sodium chloride 0.9 % 100 mL IVPB     2 g 200 mL/hr over 30 Minutes Intravenous  Once 11/08/18 1600 11/08/18 1712     Subjective: Seen and examined at bedside and was again drowsy this morning but had improved slightly.  Was able to tell me her name today and knew that she was in the hospital.  Would go back to sleep.  Patient getting hydrotherapy today and will get OOB to Chair.  No other concerns or complaints at this time.   Objective: Vitals:   11/14/18 0747 11/14/18 0800 11/14/18 0900 11/14/18 1137  BP:  (!) 119/50 (!) 146/69   Pulse:  84 84 82  Resp:  19 17 20   Temp: 97.9 F (36.6 C)   98.2 F (36.8 C)  TempSrc: Oral   Oral  SpO2:  99% 97% 98%  Weight:      Height:        Intake/Output Summary (Last 24 hours) at 11/14/2018 1805 Last data filed at 11/14/2018 1600 Gross per 24 hour  Intake 340 ml  Output 2650 ml  Net -2310 ml   Filed Weights   11/09/18 1254 11/10/18 0500 11/13/18 0400  Weight: 111.6 kg 110.3 kg 113 kg   Examination: Physical Exam:  Constitutional: Well-nourished, well-developed obese African-American female who is again drowsy and slow to respond but improved slightly from yesterday. Eyes: Lids extract are normal.  Sclera anicteric ENMT: External ears and nose appear normal.  Grossly normal hearing Neck: Appears supple no JVD Respiratory: Diminished auscultation bilaterally with no appreciable wheezing, rales or rhonchi.  Patient not tachypneic wheezing and accessory muscle breathe and not wearing any supplemental oxygen via nasal cannula Cardiovascular: Regular rate and rhythm.  No appreciable murmurs, rubs, gallops.  Has 1+ lower extremity edema Abdomen: Soft, nontender, distended secondary body habitus GU: Deferred Musculoskeletal: No contractures or cyanosis.  Left foot is not as painful but today right foot was bothering her when I palpated it Skin: Would not turn again for me to view her buttocks incision Neurologic: Patient is very drowsy and cranial nerves II through XII grossly intact and she is again slower dorsal wound but improved from yesterday Psychiatric: She was again drowsy this morning and lethargic  but not as bad as yesterday.  Was able to tell me her name and where she was and denied any pain  Data Reviewed: I have personally reviewed following labs and imaging studies  CBC: Recent Labs  Lab 11/10/18 0233 11/11/18 0323 11/12/18 0700 11/13/18 0223  11/14/18 0245  WBC 18.9* 20.8* 23.2* 21.8* 24.6*  NEUTROABS 16.4* 15.1* 19.3* 16.8* 18.2*  HGB 8.7* 8.6* 8.4* 8.2* 8.7*  HCT 28.0* 27.0* 26.2* 24.6* 27.1*  MCV 85.9 84.9 83.2 83.7 83.6  PLT 251 222 261 301 253   Basic Metabolic Panel: Recent Labs  Lab 11/10/18 0233  11/10/18 2159 11/11/18 0323 11/12/18 0700 11/13/18 0223 11/14/18 0245  NA 135   < > 131* 132* 133* 135 138  K 4.9   < > 4.6 4.9 4.8 4.6 4.6  CL 108   < > 104 105 104 108 111  CO2 16*   < > 14* 16* 17* 17* 17*  GLUCOSE 203*   < > 127* 125* 173* 135* 102*  BUN 109*   < > 110* 102* 99* 102* 98*  CREATININE 3.26*   < > 3.17* 3.29* 3.15* 2.96* 2.70*  CALCIUM 8.7*   < > 8.2* 8.5* 8.5* 8.3* 8.5*  MG 2.4  --   --  2.5* 2.5* 2.5* 2.5*  PHOS 4.1  --   --  4.3 4.5 4.1 4.2   < > = values in this interval not displayed.   GFR: Estimated Creatinine Clearance: 21.4 mL/min (A) (by C-G formula based on SCr of 2.7 mg/dL (H)). Liver Function Tests: Recent Labs  Lab 11/10/18 1512 11/11/18 0323 11/12/18 0700 11/13/18 0223 11/14/18 0245  AST 65* 52* 32 28 27  ALT 49* 47* 34 26 25  ALKPHOS 73 78 85 70 75  BILITOT 0.4 0.7 0.8 0.8 0.6  PROT 5.3* 5.5* 6.1* 5.5* 6.1*  ALBUMIN 2.0* 2.0* 2.2* 2.0* 2.0*   No results for input(s): LIPASE, AMYLASE in the last 168 hours. Recent Labs  Lab 11/12/18 1015  AMMONIA 18   Coagulation Profile: Recent Labs  Lab 11/08/18 2037  INR 1.3*   Cardiac Enzymes: Recent Labs  Lab 11/08/18 1856 11/08/18 2330 11/09/18 0554 11/09/18 1206  TROPONINI 0.09* 0.10* 0.08* 0.07*   BNP (last 3 results) No results for input(s): PROBNP in the last 8760 hours. HbA1C: No results for input(s): HGBA1C in the last 72 hours. CBG: Recent Labs  Lab 11/13/18 2303 11/14/18 0415 11/14/18 0719 11/14/18 1317 11/14/18 1617  GLUCAP 98 109* 137* 166* 145*   Lipid Profile: No results for input(s): CHOL, HDL, LDLCALC, TRIG, CHOLHDL, LDLDIRECT in the last 72 hours. Thyroid Function Tests: No results for  input(s): TSH, T4TOTAL, FREET4, T3FREE, THYROIDAB in the last 72 hours. Anemia Panel: Recent Labs    11/12/18 1015 11/14/18 0245  VITAMINB12 510 548  FOLATE  --  12.7  FERRITIN  --  1,253*  TIBC  --  115*  IRON  --  33  RETICCTPCT  --  1.6   Sepsis Labs: Recent Labs  Lab 11/08/18 1856 11/08/18 2037 11/08/18 2330 11/09/18 0554 11/12/18 1015 11/13/18 0223 11/14/18 0245  PROCALCITON  --  6.89  --   --  6.13 6.01 4.82  LATICACIDVEN 1.7 2.0* 2.9* 1.0  --   --   --     Recent Results (from the past 240 hour(s))  Blood Culture (routine x 2)     Status: None   Collection Time: 11/08/18  3:33 PM  Result Value Ref  Range Status   Specimen Description   Final    BLOOD RIGHT ANTECUBITAL Performed at [facility] [address], [place], [place] [address]    Special Requests   Final    BOTTLES DRAWN AEROBIC AND ANAEROBIC Blood Culture results may not be optimal due to an excessive volume of blood received in culture bottles Performed at [facility] [address], [place], [place] [address]    Culture   Final    NO GROWTH 5 DAYS Performed at [facility], [place] [address] [place], [place] [address]    Report Status 11/13/2018 FINAL  Final  Blood Culture (routine x 2)     Status: None   Collection Time: 11/08/18  3:41 PM  Result Value Ref Range Status   Specimen Description   Final    BLOOD LEFT ANTECUBITAL Performed at [facility] [address], [place], [place] [address]    Special Requests   Final    BOTTLES DRAWN AEROBIC AND ANAEROBIC Blood Culture results may not be optimal due to an excessive volume of blood received in culture bottles Performed at [facility] [address], [place], [place] [address]    Culture   Final    NO GROWTH 5 DAYS Performed at [facility], [place] [address] [place], [place] [address]    Report Status 11/13/2018 FINAL  Final  SARS Coronavirus 2  (CEPHEID- Performed in [place] hospital lab), Hosp Order     Status: None   Collection Time: 11/08/18  4:07 PM  Result Value Ref Range Status   SARS Coronavirus 2 NEGATIVE NEGATIVE Final    Comment: (NOTE) If result is NEGATIVE SARS-CoV-2 target nucleic acids are NOT DETECTED. The SARS-CoV-2 RNA is generally detectable in upper and lower  respiratory specimens during the acute phase of infection. The lowest  concentration of SARS-CoV-2 viral copies this assay can detect is 250  copies / mL. A negative result does not preclude SARS-CoV-2 infection  and should not be used as the sole basis for treatment or other  patient management decisions.  A negative result may occur with  improper specimen collection / handling, submission of specimen other  than nasopharyngeal swab, presence of viral mutation(s) within the  areas targeted by this assay, and inadequate number of viral copies  (<250 copies / mL). A negative result must be combined with clinical  observations, patient history, and epidemiological information. If result is POSITIVE SARS-CoV-2 target nucleic acids are DETECTED. The SARS-CoV-2 RNA is generally detectable in upper and lower  respiratory specimens dur ing the acute phase of infection.  Positive  results are indicative of active infection with SARS-CoV-2.  Clinical  correlation with patient history and other diagnostic information is  necessary to determine patient infection status.  Positive results do  not rule out bacterial infection or co-infection with other viruses. If result is PRESUMPTIVE POSTIVE SARS-CoV-2 nucleic acids MAY BE PRESENT.   A presumptive positive result was obtained on the submitted specimen  and confirmed on repeat testing.  While 2019 novel coronavirus  (SARS-CoV-2) nucleic acids may be present in the submitted sample  additional confirmatory testing may be necessary for epidemiological  and / or clinical management purposes  to differentiate  between  SARS-CoV-2 and other Sarbecovirus currently known to infect humans.  If clinically indicated additional testing with an alternate test  methodology [phone]) is advised. The SARS-CoV-2 RNA is generally  detectable in  upper and lower respiratory sp ecimens during the acute  phase of infection. The expected result is Negative. Fact Sheet for Patients:  StrictlyIdeas.no Fact Sheet for Healthcare Providers: BankingDealers.co.za This test is not yet approved or cleared by the [place] FDA and has been authorized for detection and/or diagnosis of SARS-CoV-2 by FDA under an Emergency Use Authorization (EUA).  This EUA will remain in effect (meaning this test can be used) for the duration of the COVID-19 declaration under Section 564(b)(1) of the Act, 21 U.S.C. section 360bbb-3(b)(1), unless the authorization is terminated or revoked sooner. Performed at [facility], [place] [address], [place], [place] [address]   MRSA PCR Screening     Status: None   Collection Time: 11/08/18  8:54 PM  Result Value Ref Range Status   MRSA by PCR NEGATIVE NEGATIVE Final    Comment:        The GeneXpert MRSA Assay (FDA approved for NASAL specimens only), is one component of a comprehensive MRSA colonization surveillance program. It is not intended to diagnose MRSA infection nor to guide or monitor treatment for MRSA infections. Performed at [facility], [place] [address] [place], [place] [address]   Aerobic/Anaerobic Culture (surgical/deep wound)     Status: None (Preliminary result)   Collection Time: 11/09/18  2:06 PM  Result Value Ref Range Status   Specimen Description   Final    ABSCESS LEFT BUTTOCKS Performed at [facility] [address] [place], [place] [address]    Special Requests   Final    NONE Performed at [facility], [place] [address],  [place], [place] [address]    Gram Stain   Final    ABUNDANT WBC PRESENT,BOTH PMN AND MONONUCLEAR ABUNDANT GRAM POSITIVE COCCI ABUNDANT GRAM NEGATIVE RODS    Culture   Final    FEW ESCHERICHIA COLI FEW BACTEROIDES SPECIES BETA LACTAMASE POSITIVE CULTURE REINCUBATED FOR BETTER GROWTH Performed at [facility], [address], [place], [place] [address]    Report Status PENDING  Incomplete   Organism ID, Bacteria ESCHERICHIA COLI  Final      Susceptibility   Escherichia coli - MIC*    AMPICILLIN <=2 SENSITIVE Sensitive     CEFAZOLIN <=4 SENSITIVE Sensitive     CEFEPIME <=1 SENSITIVE Sensitive     CEFTAZIDIME <=1 SENSITIVE Sensitive     CEFTRIAXONE <=1 SENSITIVE Sensitive     CIPROFLOXACIN <=0.25 SENSITIVE Sensitive     GENTAMICIN <=1 SENSITIVE Sensitive     IMIPENEM <=0.25 SENSITIVE Sensitive     TRIMETH/SULFA <=20 SENSITIVE Sensitive     AMPICILLIN/SULBACTAM <=2 SENSITIVE Sensitive     PIP/TAZO <=4 SENSITIVE Sensitive     Extended ESBL NEGATIVE Sensitive     * FEW ESCHERICHIA COLI  Urine Culture     Status: None   Collection Time: 11/10/18 12:30 PM  Result Value Ref Range Status   Specimen Description   Final    URINE, RANDOM Performed at [facility] [address], [place], [place] [address]    Special Requests   Final    NONE Performed at [facility], [place] [address], [place],  [address]    Culture   Final    NO GROWTH Performed at [facility], [place] [address] [place],  [address]    Report Status 11/11/2018 FINAL  Final    Radiology Studies: Dg Chest Port 1 View  Result Date: 11/14/2018  CLINICAL DATA:  78 year old female with shortness of breath. Negative for COVID-19 on 11/08/2018. EXAM: PORTABLE CHEST 1 VIEW COMPARISON:  11/13/2018 and earlier. FINDINGS: Portable AP semi upright view at 0439 hours. Mildly improved lung volumes. Stable cardiac size and mediastinal contours. Visualized tracheal air column  is within normal limits. Continued mild streaky right greater than left lung base opacity. Today this more resembles atelectasis. Elsewhere the lungs are clear allowing for portable technique. Negative visible bowel gas pattern. No acute osseous abnormality identified. IMPRESSION: Mildly improved lung volumes. Residual lung base opacity which more resembles atelectasis today. No new cardiopulmonary abnormality. Electronically Signed   By: Genevie Ann M.D.   On: 11/14/2018 05:18   Dg Chest Port 1 View  Result Date: 11/13/2018 CLINICAL DATA:  78 year old female with shortness of breath. Negative for COVID-19 on 11/08/2018. EXAM: PORTABLE CHEST 1 VIEW COMPARISON:  11/12/2018 and earlier. FINDINGS: Portable AP semi upright view at 0354 hours. Continued low lung volumes with platelike and streaky opacity in the right mid and lower lung. No pneumothorax, pulmonary edema, pleural effusion or other confluent opacity. Mediastinal contours are stable and within normal limits. Visualized tracheal air column is within normal limits. Visible bowel gas pattern is within normal limits. No acute osseous abnormality identified. IMPRESSION: Continued low lung volumes and streaky right mid and lower lung opacity since yesterday, indeterminate for atelectasis versus infection. Electronically Signed   By: Genevie Ann M.D.   On: 11/13/2018 05:52   Scheduled Meds: . allopurinol  300 mg Oral Daily  . amLODipine  2.5 mg Oral Daily  . aspirin EC  81 mg Oral Daily  . calcitRIOL  0.25 mcg Oral Daily  . Chlorhexidine Gluconate Cloth  6 each Topical Daily  . cloNIDine  0.1 mg Oral BID  . insulin aspart  0-15 Units Subcutaneous Q4H  . insulin aspart  6 Units Subcutaneous TID WC  . insulin glargine  15 Units Subcutaneous Daily  . levothyroxine  50 mcg Oral Q0600  . mouth rinse  15 mL Mouth Rinse BID  . metoprolol succinate  50 mg Oral Daily  . pravastatin  80 mg Oral QHS  . sodium bicarbonate  650 mg Oral QID   Continuous Infusions:  . ampicillin-sulbactam (UNASYN) IV      LOS: 6 days   Kerney Elbe, DO [facility] PAGER is on AMION  If 7PM-7AM, please contact night-coverage [email or web address] Password [facility] 11/14/2018, 6:05 PM

## 2018-11-15 ENCOUNTER — Inpatient Hospital Stay (HOSPITAL_COMMUNITY): Payer: PPO

## 2018-11-15 ENCOUNTER — Inpatient Hospital Stay (HOSPITAL_COMMUNITY)
Admission: EM | Admit: 2018-11-15 | Discharge: 2018-11-15 | Disposition: A | Discharge status: Home / Self Care | Payer: PPO | Source: Home / Self Care | Attending: Internal Medicine | Admitting: Internal Medicine

## 2018-11-15 DIAGNOSIS — N184 Chronic kidney disease, stage 4 (severe): Secondary | ICD-10-CM | POA: Diagnosis present

## 2018-11-15 DIAGNOSIS — L0231 Cutaneous abscess of buttock: Secondary | ICD-10-CM

## 2018-11-15 DIAGNOSIS — A4151 Sepsis due to Escherichia coli [E. coli]: Principal | ICD-10-CM

## 2018-11-15 DIAGNOSIS — G9341 Metabolic encephalopathy: Secondary | ICD-10-CM

## 2018-11-15 DIAGNOSIS — N179 Acute kidney failure, unspecified: Secondary | ICD-10-CM | POA: Diagnosis present

## 2018-11-15 DIAGNOSIS — L03317 Cellulitis of buttock: Secondary | ICD-10-CM

## 2018-11-15 LAB — COMPREHENSIVE METABOLIC PANEL
ALT: 22 U/L (ref 0–44)
AST: 26 U/L (ref 15–41)
Albumin: 1.9 g/dL — ABNORMAL LOW (ref 3.5–5.0)
Alkaline Phosphatase: 68 U/L (ref 38–126)
Anion gap: 8 (ref 5–15)
BUN: 95 mg/dL — ABNORMAL HIGH (ref 8–23)
CO2: 17 mmol/L — ABNORMAL LOW (ref 22–32)
Calcium: 8.6 mg/dL — ABNORMAL LOW (ref 8.9–10.3)
Chloride: 117 mmol/L — ABNORMAL HIGH (ref 98–111)
Creatinine, Ser: 2.71 mg/dL — ABNORMAL HIGH (ref 0.44–1.00)
GFR calc Af Amer: 19 mL/min — ABNORMAL LOW (ref >60)
GFR calc non Af Amer: 16 mL/min — ABNORMAL LOW (ref >60)
Glucose, Bld: 76 mg/dL (ref 70–99)
Potassium: 4.9 mmol/L (ref 3.5–5.1)
Sodium: 142 mmol/L (ref 135–145)
Total Bilirubin: 0.7 mg/dL (ref 0.3–1.2)
Total Protein: 5.9 g/dL — ABNORMAL LOW (ref 6.5–8.1)

## 2018-11-15 LAB — CBC WITH DIFFERENTIAL/PLATELET
Abs Immature Granulocytes: 1.76 10*3/uL — ABNORMAL HIGH (ref 0.00–0.07)
Basophils Absolute: 0.1 10*3/uL (ref 0.0–0.1)
Basophils Relative: 1 %
Eosinophils Absolute: 0.2 10*3/uL (ref 0.0–0.5)
Eosinophils Relative: 1 %
HCT: 26.6 % — ABNORMAL LOW (ref 36.0–46.0)
Hemoglobin: 8.7 g/dL — ABNORMAL LOW (ref 12.0–15.0)
Immature Granulocytes: 8 %
Lymphocytes Relative: 12 %
Lymphs Abs: 2.6 10*3/uL (ref 0.7–4.0)
MCH: 27.4 pg (ref 26.0–34.0)
MCHC: 32.7 g/dL (ref 30.0–36.0)
MCV: 83.6 fL (ref 80.0–100.0)
Monocytes Absolute: 1.4 10*3/uL — ABNORMAL HIGH (ref 0.1–1.0)
Monocytes Relative: 6 %
Neutro Abs: 16.5 10*3/uL — ABNORMAL HIGH (ref 1.7–7.7)
Neutrophils Relative %: 72 %
Platelets: 351 10*3/uL (ref 150–400)
RBC: 3.18 MIL/uL — ABNORMAL LOW (ref 3.87–5.11)
RDW: 15.6 % — ABNORMAL HIGH (ref 11.5–15.5)
WBC: 22.6 10*3/uL — ABNORMAL HIGH (ref 4.0–10.5)
nRBC: 0.4 % — ABNORMAL HIGH (ref 0.0–0.2)

## 2018-11-15 LAB — GLUCOSE, CAPILLARY
Glucose-Capillary: 116 mg/dL — ABNORMAL HIGH (ref 70–99)
Glucose-Capillary: 124 mg/dL — ABNORMAL HIGH (ref 70–99)
Glucose-Capillary: 154 mg/dL — ABNORMAL HIGH (ref 70–99)
Glucose-Capillary: 162 mg/dL — ABNORMAL HIGH (ref 70–99)
Glucose-Capillary: 184 mg/dL — ABNORMAL HIGH (ref 70–99)
Glucose-Capillary: 79 mg/dL (ref 70–99)

## 2018-11-15 LAB — MAGNESIUM: Magnesium: 2.7 mg/dL — ABNORMAL HIGH (ref 1.7–2.4)

## 2018-11-15 LAB — PHOSPHORUS: Phosphorus: 3.9 mg/dL (ref 2.5–4.6)

## 2018-11-15 LAB — AEROBIC/ANAEROBIC CULTURE W GRAM STAIN (SURGICAL/DEEP WOUND)

## 2018-11-15 MED ORDER — POLYETHYLENE GLYCOL 3350 17 G PO PACK
17.0000 g | PACK | Freq: Two times a day (BID) | ORAL | Status: DC
  Administered 2018-11-15 – 2018-11-16 (×4): 17 g via ORAL
  Filled 2018-11-15 (×4): qty 1

## 2018-11-15 MED ORDER — PNEUMOCOCCAL VAC POLYVALENT 25 MCG/0.5ML IJ INJ
0.5000 mL | INJECTION | INTRAMUSCULAR | Status: AC
  Administered 2018-11-17: 0.5 mL via INTRAMUSCULAR
  Filled 2018-11-15: qty 0.5

## 2018-11-15 MED ORDER — BISACODYL 10 MG RE SUPP
10.0000 mg | Freq: Every day | RECTAL | Status: DC
  Administered 2018-11-15 – 2018-11-16 (×2): 10 mg via RECTAL
  Filled 2018-11-15 (×2): qty 1

## 2018-11-15 MED ORDER — SENNOSIDES-DOCUSATE SODIUM 8.6-50 MG PO TABS: 1.0000 | ORAL_TABLET | Freq: Two times a day (BID) | ORAL | Status: DC

## 2018-11-15 MED ORDER — PANTOPRAZOLE SODIUM 40 MG PO TBEC
40.0000 mg | DELAYED_RELEASE_TABLET | Freq: Every day | ORAL | Status: DC
  Administered 2018-11-15 – 2018-11-29 (×15): 40 mg via ORAL
  Filled 2018-11-15 (×15): qty 1

## 2018-11-15 MED ORDER — INSULIN ASPART 100 UNIT/ML ~~LOC~~ SOLN: 4.0000 [IU] | Freq: Three times a day (TID) | SUBCUTANEOUS | Status: DC

## 2018-11-15 MED ORDER — SODIUM BICARBONATE 650 MG PO TABS
1300.0000 mg | ORAL_TABLET | Freq: Two times a day (BID) | ORAL | Status: DC
  Administered 2018-11-15 – 2018-11-16 (×4): 1300 mg via ORAL
  Filled 2018-11-15 (×4): qty 2

## 2018-11-15 NOTE — Progress Notes (Signed)
EEG Completed; Results Pending  

## 2018-11-15 NOTE — Progress Notes (Signed)
HYDROTHERAPY TREATMENT       11/15/18 1200  Subjective Assessment  Subjective "that hurts!"  Date of Onset  (abscess present on admission)  Prior Treatments s/p I&D 5/7  Evaluation and Treatment  Evaluation and Treatment Procedures Explained to Patient/Family Yes  Evaluation and Treatment Procedures agreed to  Wound / Incision (Open or Dehisced) 11/13/18 Incision - Open Buttocks Left L buttocks wound s/p I&D by Surgery 11/09/18. *HYDRO/PT*  Date First Assessed/Time First Assessed: 11/13/18 1500   Wound Type: Incision - Open  Location: Buttocks  Location Orientation: Left  Wound Description (Comments): L buttocks wound s/p I&D by Surgery 11/09/18. *HYDRO/PT**  Dressing Type Barrier Film (skin prep);Gauze;Moist to dry;ABD;Impregnated gauze (petrolatum) (petrolatum on periwound)  Dressing Changed Changed  Dressing Status Old drainage  Dressing Change Frequency Twice a day  Site / Wound Assessment Bleeding;Pink;Red;Yellow;Brown;Black  % Wound base Red or Granulating 60%  % Wound base Yellow/Fibrinous Exudate 20%  % Wound base Black/Eschar 20%  Peri-wound Assessment Excoriated;Bleeding;Pink  Margins Unattached edges (unapproximated)  Drainage Amount Moderate  Drainage Description Serosanguineous  Treatment Debridement (Selective);Hydrotherapy (Pulse lavage);Packing (Saline gauze)  Hydrotherapy  Pulsed lavage therapy - wound location L buttocks  Pulsed Lavage with Suction (psi) 8 psi  Pulsed Lavage with Suction - Normal Saline Used 1000 mL  Pulsed Lavage Tip Tip with splash shield  Selective Debridement  Selective Debridement - Location L buttock  Selective Debridement - Tools Used Forceps;Scissors  Selective Debridement - Tissue Removed brown/black tissue and yellow slough  Wound Therapy - Assess/Plan/Recommendations  Wound Therapy - Clinical Statement 78 yo female admitted with L gluteal/buttock abscess. S/P I&D 11/09/18.   Factors Delaying/Impairing Wound Healing Diabetes  Mellitus;Multiple medical problems;Immobility  Hydrotherapy Plan Debridement;Dressing change;Patient/family education;Pulsatile lavage with suction  Wound Therapy - Frequency 6X / week  Wound Therapy - Current Recommendations Case manager/social work  Wound Therapy - Follow Up Recommendations Skilled nursing facility;Home health RN  Wound Plan Will continue hydrotherapy and dressing changes 6x/week. Pt requires pre-medication 2*pain.   Wound Therapy Goals - Improve the function of patient's integumentary system by progressing the wound(s) through the phases of wound healing by:  Decrease Necrotic Tissue to 20%  Increase Granulation Tissue to 80%  Increase Granulation Tissue - Progress Progressing toward goal  Goals/treatment plan/discharge plan were made with and agreed upon by patient/family Yes  Time For Goal Achievement 2 weeks  Wound Therapy - Potential for Goals Good     Weston Anna, PT Acute Rehabilitation Services Pager: [phone] Office: [phone]

## 2018-11-15 NOTE — Progress Notes (Signed)
PROGRESS NOTE    Hildur Bayer  VVO:[id number] DOB: [birth year] DOA: 11/08/2018 PCP: Susy Frizzle, MD   Brief Narrative:  HPI per Dr. Toy Baker on 11/08/2018  Patient is a 78 year old African-American female with a past medical history significant for but not limited to diabetes mellitus type 2, CKD stage IV, hypothyroidism, CAD, GERD, hypertension, hyperlipidemia, history of seizure disorder, ? Atrial fibrillation, long with comorbidities who presented to the emergency room with a 3-day history of pain and swelling in her buttocks along with a fever of 102.  Family reportedly gave her some Tylenol that helped her improve her temperature down to 99 but she is found to have elevated blood pressures for last few days when in the 400s.  She has not been taking her insulin for last 2 days and has not been checking her glucose regularly.  On admission she stated that the left buttock pain lasted for last few days and was severe 10 out of 10 and reportedly was a boil that then progressed.  She was worked up and admitted for sepsis secondary to abscess of the left buttock as well as DKA.  She was placed in the stepdown unit and was placed on glucose stabilizer and has improvement in her blood sugars.  She was incidentally noted to be in atrial fibrillation/A Flutter which then converted to NSR a few days ago.  Cardiology was consulted for preoperative clearance and patient was taken for incision and drainage of her abscess on her left buttock and she is POD2. DKA improved and she was transitioned to Long Acting Insulin even though Her CO2 is not >20 given that she has chronic Metabolic Acidosis 2/2 to her Renal Disease and is on Sodium Bicarbonate. She tolerated her Diet without issues but complained of some Abdominal Pain.   Liver function tests were elevated and right upper quadrant ultrasound was done and showed possible acute cholecystitis.  I discussed with general surgery Dr. Jens Som  who recommended obtaining a HIDA scan however HIDA scan with EF is not able to be done at this time and if EF is needed to be done will come will be on Tuesday per nursing. So will obtain a HIDA scan without the EF currently.  And this HIDA scan was normal hepatobiliary scan so Dr. Windle Guard was less inclined for acute cholecystitis and felt that her LFTs being elevated could be reactionary.  We will continue to monitor closely and antibiotics have been de-escalated to just IV cefepime Flagyl for now and will continue to de-escalate accordingly.  Echocardiogram was done yesterday.  On 11/12/2018 the patient was lethargic and slower to respond.  Obtained an ABG which showed some mild hypoxemia and ammonia level was 18.  Head CT was done and showed no acute intracranial findings and mild atrophy and white matter microvascular disease.  WBC is slowly trending up so repeat cultures were ordered including a chest x-ray, blood cultures, urinalysis.  Chest x-ray showed minimal bibasilar segment atelectasis.  Patient was complaining of left foot pain so foot x-ray was also obtained findings.  Patient's somnolence does not improve will obtain a neurologic consult in the a.m.  Repeat urinalysis showed cloudy urine and small hemoglobin along with moderate leukocytes, few bacteria and 11-20 BCs.  She was on IV antibiotics.   11/13/2018 remained somnolent (received 10 mg of po Hydrocodone this AM) so narcotics have been adjusted and IV morphine has been discontinued along with p.o. hydrocodone.  We will continue to monitor and  white blood cell count is now trending down but in general surgery is recommending hydrotherapy but no further surgical intervention.  Today 11/14/2018 was still a little lethargic but improving.  Patient started hydrotherapy and antibiotics have been further adjusted from IV cefepime to IV Unasyn.  White blood cell count remains somewhat elevated but could be secondary to pain.  We will continue  monitor patient mental status given her changes in her opiate regimen and may need to adjust further.     Assessment & Plan:   Principal Problem:   Sepsis ([place]) Active Problems:   Abscess of left buttock   DKA (diabetic ketoacidoses) (HCC)   Acute renal failure superimposed on stage 4 chronic kidney disease (HCC)   Essential hypertension   CAD (coronary artery disease)   GERD (gastroesophageal reflux disease)   Chronic kidney disease (CKD), stage IV (severe) (HCC)   Atrial fibrillation with RVR (HCC)   Elevated troponin   Abnormal LFTs   Acute metabolic encephalopathy  #1 sepsis secondary to left buttocks/gluteal abscess status post incision and drainage of abscess POD #6. Patient was admitted meeting sepsis criteria with fever, leukocytosis, tachycardia(new onset atrial fibrillation).  Sepsis protocol was initiated in the ED.  Sepsis physiology improving as patient currently afebrile.  Tachycardia improving.  Patient still with a leukocytosis which is fluctuating and currently at 22.6 from 24.6.  Blood cultures with no growth to date x5 days.  Urine culture is negative.  MRSA PCR negative.  COVID-19 negative.  Cultures from left buttocks abscess positive for E. coli which is pansensitive.  Patient was seen in consultation by general surgery and underwent incision and drainage of abscess with cultures sent.  Procalcitonin level was 6.89 and trended down to 4.82.  Lactic acid level trended down from 2.9-1.0.  Patient initially placed on IV vancomycin, IV cefepime and IV metronidazole.  Antibiotics narrowed down patient currently on IV Unasyn.  Patient noted to have ongoing leukocytosis repeat urinalysis showed moderate leukocytes, nitrite negative, few bacteria, 11-20 WBCs.  Repeat chest x-ray done 11/12/2018- for any acute infiltrate however consistent with atelectasis.  Patient currently receiving hydrotherapy which was started on 11/14/2018.  Patient also on twice daily wet-to-dry dressing  changes per general surgery.  Continue empiric IV Unasyn.  Supportive care.  Follow.  2.  Diabetes mellitus type 2/DKA Patient had presented in DKA felt secondary to problem #1.  Hemoglobin A1c 8.4.  On admission patient noted to have a beta hydroxybutyric acid of 2.29.  Patient was placed on the glucose stabilizer and subsequently transition to subcutaneous Lantus at 15 units daily as well as sliding scale insulin and meal coverage NovoLog of 6 units 3 times daily with meals.  Patient with poor oral intake.  Patient with complaints of nausea this morning.  CBG of 79 this morning.  Continue Lantus 15 units daily and may need to decrease pending blood glucose levels.  Discontinue meal coverage NovoLog.  Continue sliding scale insulin.  Follow.  3.  New onset atrial fibrillation/atrial flutter with variable block status post conversion to normal sinus rhythm with transient third-degree AV block. Felt likely in the setting of sepsis secondary to problem #1.  Patient converted to normal sinus rhythm with a short episode of third-degree AV block overnight while asleep thought possibly secondary to AV nodal blocking drugs and sleep apnea.  Patient initially on a Cardizem drip and subsequently transition to oral Cardizem which has subsequently been discontinued.  Patient seen in consultation by cardiology who feels no  need for anticoagulation at this time given short duration and need for surgical intervention.  Continue current regimen of Toprol-XL 50 mg daily.  Appreciate cardiology input and recommendations.  4.  Nausea Patient with complaints of nausea this morning.  Patient also with some complaints of constipation not sure the last time she had bowel movement.  Place on a Dulcolax suppository daily.  Soapsuds enema x1.  Will place on a stool softener daily as patient on pain medications.  If no improvement with nausea will get abdominal films.  5.  Hypertension Continue Toprol XL, Norvasc, clonidine.   Follow.  6.  Elevated troponin Felt secondary to demand ischemia in the setting of sepsis, elevated lactic acid and acute on chronic kidney failure.  EKG with question of flutter versus sinus tach with PACs.  Repeat EKG showed a normal sinus rhythm and evidence of LVH with no signs of ischemia noted.  Patient seen by cardiology who felt no further ischemic work-up needed at this time.  Continue aspirin 81 mg daily, Toprol-XL, statin.  Follow.  7.  Acute on chronic kidney disease stage IV/chronic metabolic acidosis Felt secondary to prerenal azotemia in the setting of sepsis.  Patient on admission noted to have a creatinine of 4.76.  Patient placed on IV fluids which have subsequently been discontinued.  Renal function improved and currently close to baseline.  Creatinine at 2.71.  Continue calcitriol.  Change bicarb tablets to 1300 mg twice daily and follow may increase up to 3 times daily if needed.  Outpatient follow-up with nephrology.  8.  Hypothyroidism TSH of 1.815 on 11/09/2018.  Continue home dose Synthroid.  Outpatient follow-up.  9.  Coronary artery disease Currently stable.  Patient denies any acute chest pain.  Patient was seen by cardiology during the hospitalization secondary to new onset atrial flutter with variable block.  Continue current cardiac medications of aspirin, Toprol-XL, statin, Norvasc.  Outpatient follow-up with cardiology.  10.  Hyponatremia Improved.  11.  Hypermagnesemia Monitor.  Magnesium level at 2.7.  Follow.  12.  Acute encephalopathy/somnolence and lethargy Questionable etiology.  Likely medication induced.  Patient noted to be somnolent and lethargic however slowly improving.  Head CT done was negative for any acute abnormalities.  ABG with a pH of 7.383/PCO2 of 27/PO2 of 77.  Chest x-ray negative, blood cultures negative to date, urine cultures negative.  Patient being treated for left gluteal abscess on empiric IV antibiotics.  Continue to limit sedating  medications.  IV morphine has been discontinued.  Patient now on oxycodone.  Patient seems to be slowly improving following commands appropriately and answering some questions.  Monitor for now.  13.  Morbid obesity Weight loss and dietary counseling given.  14.  History of gout Stable.  Colchicine on hold.  Continue allopurinol.  15.  Abnormal LFTs Likely reactive.  LFTs trended down.  Acute hepatitis panel was negative.  Right upper quadrant ultrasound showed gallbladder sludge and positive sonographic Murphy sign however no biliary duct dilatation.  HIDA scan which was done was normal.  Dr. Alfredia Ferguson discussed the case with general surgery who recommended no further intervention at this time and to continue to trend hepatic function.  16.  Normocytic anemia/anemia of chronic kidney disease Likely dilutional and postoperative drop.  Hemoglobin was 11.4 and dropped to 8.7 however currently stable.  Patient with no overt bleeding.  Anemia panel consistent with anemia of chronic disease.  Follow H&H.  Transfusion threshold hemoglobin less than 7.  17.  Leukocytosis Likely  secondary to problem #1.  WBC fluctuating.  Patient has been pancultured with blood cultures with no growth to date, urine culture is negative, chest x-ray with no acute infiltrate.  Patient with no respiratory symptoms.  Patient with left gluteal abscess that has been I&D and cultures positive for E. coli.  Patient currently on IV Unasyn which we will continue for now.  Follow.  Mating   18. gastroesophageal reflux disease Place on a PPI.     DVT prophylaxis: SCDs Code Status: Full Family Communication: Updated patient.  No family at bedside. Disposition Plan: Remain in stepdown unit.  If continued improvement with mental status likely transfer to telemetry/MedSurg in the next 24 hours.  Likely needs SNF on discharge.   Consultants:   Cardiology: Dr. Meda Coffee 11/09/2018  General surgery: Dr. Marlou Starks III  11/08/2018  Procedures:   CT head 11/12/2018  CT pelvis 11/08/2018  Chest x-ray 11/08/2018, 11/12/2018, 11/13/2018, 11/15/2018  Plain films of the left foot 11/12/2018  Limited echocardiogram 11/11/2018  Right upper quadrant ultrasound 11/11/2018  Incision and drainage of left buttocks abscess 11/09/2018 per Dr. Ninfa [place]  Antimicrobials:   Oral Flagyl 11/13/2018>>>> 11/14/2018  IV Unasyn 11/14/2018  IV cefepime 11/08/2018>>>> 11/14/2018  IV Flagyl 11/08/2018>>>>> 11/13/2018  IV vancomycin 11/08/2018>>>> 11/11/2018   Subjective: Patient sitting up in bed.  Patient with complaints of nausea.  Patient denies any shortness of breath.  No chest pain.  Patient denies any abdominal pain.  Patient still noted to have some rectal pain.  RN at bedside.  Objective: Vitals:   11/15/18 0400 11/15/18 0700 11/15/18 0800 11/15/18 0900  BP: (!) 149/62 (!) 154/63 (!) 147/70 (!) 159/75  Pulse: 90  91 93  Resp: 15 15 18 19   Temp: 98.1 F (36.7 C)  98.3 F (36.8 C)   TempSrc: Oral  Oral   SpO2: 99%  99% 99%  Weight:      Height:        Intake/Output Summary (Last 24 hours) at 11/15/2018 0954 Last data filed at 11/15/2018 0855 Gross per 24 hour  Intake 340 ml  Output 2100 ml  Net -1760 ml   Filed Weights   11/09/18 1254 11/10/18 0500 11/13/18 0400  Weight: 111.6 kg 110.3 kg 113 kg    Examination:  General exam: Appears calm and comfortable  Respiratory system: Clear to auscultation. Respiratory effort normal. Cardiovascular system: Tachycardic.  No JVD, no murmurs, no rubs, no gallops.  No lower extremity edema.  Gastrointestinal system: Abdomen is nondistended, soft and nontender. No organomegaly or masses felt. Normal bowel sounds heard. Central nervous system: Alert. No focal neurological deficits.  Moving extremities spontaneously. Extremities: Symmetric 5 x 5 power. Skin: No rashes, lesions or ulcers Psychiatry: Judgement and insight appear fair. Mood & affect appropriate.     Data Reviewed:  I have personally reviewed following labs and imaging studies  CBC: Recent Labs  Lab 11/11/18 0323 11/12/18 0700 11/13/18 0223 11/14/18 0245 11/15/18 0231  WBC 20.8* 23.2* 21.8* 24.6* 22.6*  NEUTROABS 15.1* 19.3* 16.8* 18.2* 16.5*  HGB 8.6* 8.4* 8.2* 8.7* 8.7*  HCT 27.0* 26.2* 24.6* 27.1* 26.6*  MCV 84.9 83.2 83.7 83.6 83.6  PLT 222 261 301 314 448   Basic Metabolic Panel: Recent Labs  Lab 11/11/18 0323 11/12/18 0700 11/13/18 0223 11/14/18 0245 11/15/18 0231  NA 132* 133* 135 138 142  K 4.9 4.8 4.6 4.6 4.9  CL 105 104 108 111 117*  CO2 16* 17* 17* 17* 17*  GLUCOSE 125* 173* 135*  102* 76  BUN 102* 99* 102* 98* 95*  CREATININE 3.29* 3.15* 2.96* 2.70* 2.71*  CALCIUM 8.5* 8.5* 8.3* 8.5* 8.6*  MG 2.5* 2.5* 2.5* 2.5* 2.7*  PHOS 4.3 4.5 4.1 4.2 3.9   GFR: Estimated Creatinine Clearance: 21.4 mL/min (A) (by C-G formula based on SCr of 2.71 mg/dL (H)). Liver Function Tests: Recent Labs  Lab 11/11/18 0323 11/12/18 0700 11/13/18 0223 11/14/18 0245 11/15/18 0231  AST 52* 32 28 27 26   ALT 47* 34 26 25 22   ALKPHOS 78 85 70 75 68  BILITOT 0.7 0.8 0.8 0.6 0.7  PROT 5.5* 6.1* 5.5* 6.1* 5.9*  ALBUMIN 2.0* 2.2* 2.0* 2.0* 1.9*   No results for input(s): LIPASE, AMYLASE in the last 168 hours. Recent Labs  Lab 11/12/18 1015  AMMONIA 18   Coagulation Profile: Recent Labs  Lab 11/08/18 2037  INR 1.3*   Cardiac Enzymes: Recent Labs  Lab 11/08/18 1856 11/08/18 2330 11/09/18 0554 11/09/18 1206  TROPONINI 0.09* 0.10* 0.08* 0.07*   BNP (last 3 results) No results for input(s): PROBNP in the last 8760 hours. HbA1C: No results for input(s): HGBA1C in the last 72 hours. CBG: Recent Labs  Lab 11/14/18 1617 11/14/18 1918 11/14/18 2330 11/15/18 0351 11/15/18 0750  GLUCAP 145* 132* 91 79 116*   Lipid Profile: No results for input(s): CHOL, HDL, LDLCALC, TRIG, CHOLHDL, LDLDIRECT in the last 72 hours. Thyroid Function Tests: No results for input(s): TSH, T4TOTAL,  FREET4, T3FREE, THYROIDAB in the last 72 hours. Anemia Panel: Recent Labs    11/12/18 1015 11/14/18 0245  VITAMINB12 510 548  FOLATE  --  12.7  FERRITIN  --  1,253*  TIBC  --  115*  IRON  --  33  RETICCTPCT  --  1.6   Sepsis Labs: Recent Labs  Lab 11/08/18 1856 11/08/18 2037 11/08/18 2330 11/09/18 0554 11/12/18 1015 11/13/18 0223 11/14/18 0245  PROCALCITON  --  6.89  --   --  6.13 6.01 4.82  LATICACIDVEN 1.7 2.0* 2.9* 1.0  --   --   --     Recent Results (from the past 240 hour(s))  Blood Culture (routine x 2)     Status: None   Collection Time: 11/08/18  3:33 PM  Result Value Ref Range Status   Specimen Description   Final    BLOOD RIGHT ANTECUBITAL Performed at [facility], [place] [address] [place], [place] [address]    Special Requests   Final    BOTTLES DRAWN AEROBIC AND ANAEROBIC Blood Culture results may not be optimal due to an excessive volume of blood received in culture bottles Performed at [facility] [address] [place], [place] [address]    Culture   Final    NO GROWTH 5 DAYS Performed at [facility], [place] [address], [place], [place] [address]    Report Status 11/13/2018 FINAL  Final  Blood Culture (routine x 2)     Status: None   Collection Time: 11/08/18  3:41 PM  Result Value Ref Range Status   Specimen Description   Final    BLOOD LEFT ANTECUBITAL Performed at [facility] [address], [place], [place] [address]    Special Requests   Final    BOTTLES DRAWN AEROBIC AND ANAEROBIC Blood Culture results may not be optimal due to an excessive volume of blood received in culture bottles Performed at [facility] [address] [place], [place] [address]  Culture   Final    NO GROWTH 5 DAYS Performed at [facility], [place] [address], [place], [place] [address]    Report Status 11/13/2018 FINAL  Final  SARS Coronavirus 2 (CEPHEID- Performed in [place] hospital lab), Hosp Order     Status: None   Collection Time: 11/08/18  4:07 PM  Result Value Ref Range Status   SARS Coronavirus 2 NEGATIVE NEGATIVE Final    Comment: (NOTE) If result is NEGATIVE SARS-CoV-2 target nucleic acids are NOT DETECTED. The SARS-CoV-2 RNA is generally detectable in upper and lower  respiratory specimens during the acute phase of infection. The lowest  concentration of SARS-CoV-2 viral copies this assay can detect is 250  copies / mL. A negative result does not preclude SARS-CoV-2 infection  and should not be used as the sole basis for treatment or other  patient management decisions.  A negative result may occur with  improper specimen collection / handling, submission of specimen other  than nasopharyngeal swab, presence of viral mutation(s) within the  areas targeted by this assay, and inadequate number of viral copies  (<250 copies / mL). A negative result must be combined with clinical  observations, patient history, and epidemiological information. If result is POSITIVE SARS-CoV-2 target nucleic acids are DETECTED. The SARS-CoV-2 RNA is generally detectable in upper and lower  respiratory specimens dur ing the acute phase of infection.  Positive  results are indicative of active infection with SARS-CoV-2.  Clinical  correlation with patient history and other diagnostic information is  necessary to determine patient infection status.  Positive results do  not rule out bacterial infection or co-infection with other viruses. If result is PRESUMPTIVE POSTIVE SARS-CoV-2 nucleic acids MAY BE PRESENT.   A presumptive positive result was obtained on the submitted specimen  and confirmed on repeat testing.  While 2019 novel coronavirus  (SARS-CoV-2) nucleic acids may be present in the submitted sample  additional confirmatory testing may be necessary for epidemiological  and / or clinical management purposes  to differentiate between  SARS-CoV-2 and  other Sarbecovirus currently known to infect humans.  If clinically indicated additional testing with an alternate test  methodology [phone]) is advised. The SARS-CoV-2 RNA is generally  detectable in upper and lower respiratory sp ecimens during the acute  phase of infection. The expected result is Negative. Fact Sheet for Patients:  StrictlyIdeas.no Fact Sheet for Healthcare Providers: BankingDealers.co.za This test is not yet approved or cleared by the [place] FDA and has been authorized for detection and/or diagnosis of SARS-CoV-2 by FDA under an Emergency Use Authorization (EUA).  This EUA will remain in effect (meaning this test can be used) for the duration of the COVID-19 declaration under Section 564(b)(1) of the Act, 21 U.S.C. section 360bbb-3(b)(1), unless the authorization is terminated or revoked sooner. Performed at [facility], [place] [address] [place], [place] [address]   MRSA PCR Screening     Status: None   Collection Time: 11/08/18  8:54 PM  Result Value Ref Range Status   MRSA by PCR NEGATIVE NEGATIVE Final    Comment:        The GeneXpert MRSA Assay (FDA approved for NASAL specimens only), is one component of a comprehensive MRSA colonization surveillance program. It is not intended to diagnose MRSA infection nor to guide or monitor treatment for MRSA infections. Performed at [facility], [place] [address] [place], [place] [address]   Aerobic/Anaerobic Culture (surgical/deep wound)  Status: None (Preliminary result)   Collection Time: 11/09/18  2:06 PM  Result Value Ref Range Status   Specimen Description   Final    ABSCESS LEFT BUTTOCKS Performed at [facility] [address], [place], [place] [address]    Special Requests   Final    NONE Performed at [facility], [place] [address] [place], [place] [address]    Gram  Stain   Final    ABUNDANT WBC PRESENT,BOTH PMN AND MONONUCLEAR ABUNDANT GRAM POSITIVE COCCI ABUNDANT GRAM NEGATIVE RODS    Culture   Final    FEW ESCHERICHIA COLI FEW BACTEROIDES SPECIES BETA LACTAMASE POSITIVE CULTURE REINCUBATED FOR BETTER GROWTH Performed at [facility], [address], [place], [place] [address]    Report Status PENDING  Incomplete   Organism ID, Bacteria ESCHERICHIA COLI  Final      Susceptibility   Escherichia coli - MIC*    AMPICILLIN <=2 SENSITIVE Sensitive     CEFAZOLIN <=4 SENSITIVE Sensitive     CEFEPIME <=1 SENSITIVE Sensitive     CEFTAZIDIME <=1 SENSITIVE Sensitive     CEFTRIAXONE <=1 SENSITIVE Sensitive     CIPROFLOXACIN <=0.25 SENSITIVE Sensitive     GENTAMICIN <=1 SENSITIVE Sensitive     IMIPENEM <=0.25 SENSITIVE Sensitive     TRIMETH/SULFA <=20 SENSITIVE Sensitive     AMPICILLIN/SULBACTAM <=2 SENSITIVE Sensitive     PIP/TAZO <=4 SENSITIVE Sensitive     Extended ESBL NEGATIVE Sensitive     * FEW ESCHERICHIA COLI  Urine Culture     Status: None   Collection Time: 11/10/18 12:30 PM  Result Value Ref Range Status   Specimen Description   Final    URINE, RANDOM Performed at [facility] [address] [place], [place] [address]    Special Requests   Final    NONE Performed at [facility], [place] [address], [place], [place] [address]    Culture   Final    NO GROWTH Performed at [facility], [place] [address], [place],  [address]    Report Status 11/11/2018 FINAL  Final         Radiology Studies: Dg Chest Port 1 View  Result Date: 11/15/2018 CLINICAL DATA:  78 year old female with shortness of breath. Sepsis. Negative for COVID-19 on 11/08/2018. EXAM: PORTABLE CHEST 1 VIEW COMPARISON:  11/14/2018 and earlier. FINDINGS: Portable AP semi upright view at 0438 hours. Continued somewhat low lung volumes. Mediastinal contours remain normal. No pneumothorax, pulmonary edema or pleural  effusion. Regressed patchy lung base opacity, no confluent opacity today. Negative visible bowel gas pattern. No acute osseous abnormality identified. IMPRESSION: Decreasing lung base atelectasis and no new cardiopulmonary abnormality. Electronically Signed   By: Genevie Ann M.D.   On: 11/15/2018 06:16   Dg Chest Port 1 View  Result Date: 11/14/2018 CLINICAL DATA:  78 year old female with shortness of breath. Negative for COVID-19 on 11/08/2018. EXAM: PORTABLE CHEST 1 VIEW COMPARISON:  11/13/2018 and earlier. FINDINGS: Portable AP semi upright view at 0439 hours. Mildly improved lung volumes. Stable cardiac size and mediastinal contours. Visualized tracheal air column is within normal limits. Continued mild streaky right greater than left lung base opacity. Today this more resembles atelectasis. Elsewhere the lungs are clear allowing for portable technique. Negative visible bowel gas pattern. No acute osseous abnormality identified. IMPRESSION: Mildly improved lung volumes. Residual lung base opacity which more resembles atelectasis today. No new cardiopulmonary abnormality. Electronically Signed  By: Genevie Ann M.D.   On: 11/14/2018 05:18        Scheduled Meds:  allopurinol  300 mg Oral Daily   amLODipine  2.5 mg Oral Daily   aspirin EC  81 mg Oral Daily   bisacodyl  10 mg Rectal Daily   calcitRIOL  0.25 mcg Oral Daily   Chlorhexidine Gluconate Cloth  6 each Topical Daily   cloNIDine  0.1 mg Oral BID   insulin aspart  0-15 Units Subcutaneous Q4H   insulin glargine  15 Units Subcutaneous Daily   levothyroxine  50 mcg Oral Q0600   mouth rinse  15 mL Mouth Rinse BID   metoprolol succinate  50 mg Oral Daily   [START ON 11/16/2018] pneumococcal 23 valent vaccine  0.5 mL Intramuscular Tomorrow-1000   pravastatin  80 mg Oral QHS   sodium bicarbonate  1,300 mg Oral BID   Continuous Infusions:  ampicillin-sulbactam (UNASYN) IV 3 g (11/15/18 0915)     LOS: 7 days    Time spent: 40  minutes    Irine Seal, MD [facility]  If 7PM-7AM, please contact night-coverage [email or web address] 11/15/2018, 9:54 AM

## 2018-11-15 NOTE — Procedures (Signed)
History: 78 year old female being evaluated for encephalopathy  Sedation: None  Technique: This is a 21 channel routine scalp EEG performed at the bedside with bipolar and monopolar montages arranged in accordance to the international 10/20 system of electrode placement. One channel was dedicated to EKG recording.    Background: There is a moderately poorly formed posterior dominant rhythm that achieves a maximal frequency of 8 Hz.  In addition, there is intrusion of generalized irregular delta and theta activities throughout the recording.  There are occasional poorly formed bifrontally predominant discharges with triphasic morphology.  Photic stimulation: Physiologic driving is not performed  EEG Abnormalities: 1) occasional triphasic waves 2) generalized irregular slow activity  Clinical Interpretation: This EEG is consistent with a generalized nonspecific cerebral dysfunction (encephalopathy). There was no seizure or seizure predisposition recorded on this study. Please note that lack of epileptiform activity on EEG does not preclude the possibility of epilepsy.   Roland Rack, MD [facility] [phone]  If 7pm- 7am, please page neurology on call as listed in [place].

## 2018-11-15 NOTE — Progress Notes (Signed)
Inpatient Diabetes Program Recommendations  AACE/ADA: New Consensus Statement on Inpatient Glycemic Control (2015)  Target Ranges:  Prepandial:   less than 140 mg/dL      Peak postprandial:   less than 180 mg/dL (1-2 hours)      Critically ill patients:  140 - 180 mg/dL   Lab Results  Component Value Date   GLUCAP 116 (H) 11/15/2018   HGBA1C 8.4 (H) 11/11/2018    Review of Glycemic Control  Blood sugars 76-166 mg/dL over past 24H  Inpatient Diabetes Program Recommendations:    Consider decreasing Lantus to 14 units QD. (76 mg/dL early am)  Hopefully avoid any hypoglycemia. Will follow closely.  Thank you. Lorenda Peck, RD, LDN, CDE Inpatient Diabetes Coordinator [phone]

## 2018-11-15 NOTE — Progress Notes (Signed)
6 Days Post-Op    CC: Gluteal abscess  Subjective: Patient stable.  We took down the dressing for hydrotherapy today.  She still has some soupy drainage, some white fibrinous tissue and some muscle that is necrotic.  She will have hydrotherapy and then they will sharply debride some of the necrotic muscle at the base of the wound.  Overall it looks much better.  The skin around the open abscess is improved.  Small skin tear with tape removal today.  Objective: Vital signs in last 24 hours: Temp:  [98.1 F (36.7 C)-98.6 F (37 C)] 98.3 F (36.8 C) (05/13 0800) Pulse Rate:  [79-93] 82 (05/13 1100) Resp:  [15-23] 19 (05/13 1100) BP: (121-159)/(53-75) 121/53 (05/13 1100) SpO2:  [97 %-100 %] 99 % (05/13 1100) Last BM Date: (UTA)  Intake/Output from previous day: 05/12 0701 - 05/13 0700 In: 100 [IV Piggyback:100] Out: 1800 [Urine:1800] Intake/Output this shift: Total I/O In: 297.7 [P.O.:240; IV Piggyback:57.7] Out: 300 [Urine:300]  General appearance: alert, cooperative and no distress Skin: Skin around the abscess site is improving.  Small skin tear with tape removal today.  The abscess cavity is pretty clean there is still some soupy-like drainage and white fibrinous tissue with little bit of muscle it needs to be debrided.  Lab Results:  Recent Labs    11/14/18 0245 11/15/18 0231  WBC 24.6* 22.6*  HGB 8.7* 8.7*  HCT 27.1* 26.6*  PLT 314 351    BMET Recent Labs    11/14/18 0245 11/15/18 0231  NA 138 142  K 4.6 4.9  CL 111 117*  CO2 17* 17*  GLUCOSE 102* 76  BUN 98* 95*  CREATININE 2.70* 2.71*  CALCIUM 8.5* 8.6*   PT/INR No results for input(s): LABPROT, INR in the last 72 hours.  Recent Labs  Lab 11/11/18 0323 11/12/18 0700 11/13/18 0223 11/14/18 0245 11/15/18 0231  AST 52* 32 28 27 26   ALT 47* 34 26 25 22   ALKPHOS 78 85 70 75 68  BILITOT 0.7 0.8 0.8 0.6 0.7  PROT 5.5* 6.1* 5.5* 6.1* 5.9*  ALBUMIN 2.0* 2.2* 2.0* 2.0* 1.9*     Lipase      Component Value Date/Time   LIPASE 21 06/24/2014 0816     Medications: . allopurinol  300 mg Oral Daily  . amLODipine  2.5 mg Oral Daily  . aspirin EC  81 mg Oral Daily  . bisacodyl  10 mg Rectal Daily  . calcitRIOL  0.25 mcg Oral Daily  . Chlorhexidine Gluconate Cloth  6 each Topical Daily  . cloNIDine  0.1 mg Oral BID  . insulin aspart  0-15 Units Subcutaneous Q4H  . insulin glargine  15 Units Subcutaneous Daily  . levothyroxine  50 mcg Oral Q0600  . mouth rinse  15 mL Mouth Rinse BID  . metoprolol succinate  50 mg Oral Daily  . pantoprazole  40 mg Oral Q0600  . [START ON 11/16/2018] pneumococcal 23 valent vaccine  0.5 mL Intramuscular Tomorrow-1000  . polyethylene glycol  17 g Oral BID  . pravastatin  80 mg Oral QHS  . sodium bicarbonate  1,300 mg Oral BID    Assessment/Plan Sepsis DKA Type 2 diabetes Chronic on acute;  stage IVkidney disease Hypertension Elevated troponin/Atrial fibrillation Hx of palpitations GERD Hx of seizures 06/2015 Morbid obesity BMI 43.5 Leukocytosis 21.8(5/7)>> 18.9>> 20.8>> 21.8(5/11)  Left buttocks/perianal abscess Incision and drainage of left buttocks abscess 11/09/2018, DR. Coralie Keens  FEN: Heart healthy/carb modified diet ID: Rocephin 5/6x  1 dose; Maxipime 5/7 - 5/12 ,  Flagyl 5/7 -  5/11; vancomycin x1 dose 11/08/2018; Unasyn 5/12>> day 2 DVT: SCDs/can be on chemical DVT prophylaxis from our standpoint Follow-up: To be determined POC: Carlena Sax [phone]   Plan: Continue local wound care and hydrotherapy.       LOS: 7 days    Emily Hughes 11/15/2018 [phone]

## 2018-11-16 LAB — GLUCOSE, CAPILLARY
Glucose-Capillary: 80 mg/dL (ref 70–99)
Glucose-Capillary: 125 mg/dL — ABNORMAL HIGH (ref 70–99)
Glucose-Capillary: 177 mg/dL — ABNORMAL HIGH (ref 70–99)
Glucose-Capillary: 179 mg/dL — ABNORMAL HIGH (ref 70–99)
Glucose-Capillary: 190 mg/dL — ABNORMAL HIGH (ref 70–99)

## 2018-11-16 LAB — HEMOGLOBIN AND HEMATOCRIT, BLOOD
HCT: 26.2 % — ABNORMAL LOW (ref 36.0–46.0)
Hemoglobin: 8.4 g/dL — ABNORMAL LOW (ref 12.0–15.0)

## 2018-11-16 LAB — RENAL FUNCTION PANEL
Albumin: 1.7 g/dL — ABNORMAL LOW (ref 3.5–5.0)
Anion gap: 5 (ref 5–15)
BUN: 92 mg/dL — ABNORMAL HIGH (ref 8–23)
CO2: 19 mmol/L — ABNORMAL LOW (ref 22–32)
Calcium: 8.4 mg/dL — ABNORMAL LOW (ref 8.9–10.3)
Chloride: 118 mmol/L — ABNORMAL HIGH (ref 98–111)
Creatinine, Ser: 2.73 mg/dL — ABNORMAL HIGH (ref 0.44–1.00)
GFR calc Af Amer: 19 mL/min — ABNORMAL LOW (ref >60)
GFR calc non Af Amer: 16 mL/min — ABNORMAL LOW (ref >60)
Glucose, Bld: 107 mg/dL — ABNORMAL HIGH (ref 70–99)
Phosphorus: 4.1 mg/dL (ref 2.5–4.6)
Potassium: 4.5 mmol/L (ref 3.5–5.1)
Sodium: 142 mmol/L (ref 135–145)

## 2018-11-16 LAB — CBC WITH DIFFERENTIAL/PLATELET
Abs Immature Granulocytes: 1.1 10*3/uL — ABNORMAL HIGH (ref 0.00–0.07)
Basophils Absolute: 0.1 10*3/uL (ref 0.0–0.1)
Basophils Relative: 0 %
Eosinophils Absolute: 0.2 10*3/uL (ref 0.0–0.5)
Eosinophils Relative: 1 %
HCT: 25 % — ABNORMAL LOW (ref 36.0–46.0)
Hemoglobin: 7.7 g/dL — ABNORMAL LOW (ref 12.0–15.0)
Immature Granulocytes: 6 %
Lymphocytes Relative: 11 %
Lymphs Abs: 2.1 10*3/uL (ref 0.7–4.0)
MCH: 26.2 pg (ref 26.0–34.0)
MCHC: 30.8 g/dL (ref 30.0–36.0)
MCV: 85 fL (ref 80.0–100.0)
Monocytes Absolute: 1.5 10*3/uL — ABNORMAL HIGH (ref 0.1–1.0)
Monocytes Relative: 8 %
Neutro Abs: 14.4 10*3/uL — ABNORMAL HIGH (ref 1.7–7.7)
Neutrophils Relative %: 74 %
Platelets: 336 10*3/uL (ref 150–400)
RBC: 2.94 MIL/uL — ABNORMAL LOW (ref 3.87–5.11)
RDW: 15.7 % — ABNORMAL HIGH (ref 11.5–15.5)
WBC: 19.3 10*3/uL — ABNORMAL HIGH (ref 4.0–10.5)
nRBC: 0.2 % (ref 0.0–0.2)

## 2018-11-16 LAB — MAGNESIUM: Magnesium: 2.9 mg/dL — ABNORMAL HIGH (ref 1.7–2.4)

## 2018-11-16 MED ORDER — AMLODIPINE BESYLATE 5 MG PO TABS
5.0000 mg | ORAL_TABLET | Freq: Every day | ORAL | Status: DC
  Administered 2018-11-16 – 2018-11-18 (×3): 5 mg via ORAL
  Filled 2018-11-16 (×3): qty 1

## 2018-11-16 NOTE — Progress Notes (Addendum)
PROGRESS NOTE    Emily Hughes  SWN:[id number] DOB: 05-06-1941 DOA: 11/08/2018 PCP: Susy Frizzle, MD   Brief Narrative:  HPI per Dr. Toy Baker on 11/08/2018  Patient is a 78 year old African-American female with a past medical history significant for but not limited to diabetes mellitus type 2, CKD stage IV, hypothyroidism, CAD, GERD, hypertension, hyperlipidemia, history of seizure disorder, ? Atrial fibrillation, long with comorbidities who presented to the emergency room with a 3-day history of pain and swelling in her buttocks along with a fever of 102.  Family reportedly gave her some Tylenol that helped her improve her temperature down to 99 but she is found to have elevated blood pressures for last few days when in the 400s.  She has not been taking her insulin for last 2 days and has not been checking her glucose regularly.  On admission she stated that the left buttock pain lasted for last few days and was severe 10 out of 10 and reportedly was a boil that then progressed.  She was worked up and admitted for sepsis secondary to abscess of the left buttock as well as DKA.  She was placed in the stepdown unit and was placed on glucose stabilizer and has improvement in her blood sugars.  She was incidentally noted to be in atrial fibrillation/A Flutter which then converted to NSR a few days ago.  Cardiology was consulted for preoperative clearance and patient was taken for incision and drainage of her abscess on her left buttock and she is POD2. DKA improved and she was transitioned to Long Acting Insulin even though Her CO2 is not >20 given that she has chronic Metabolic Acidosis 2/2 to her Renal Disease and is on Sodium Bicarbonate. She tolerated her Diet without issues but complained of some Abdominal Pain.   Liver function tests were elevated and right upper quadrant ultrasound was done and showed possible acute cholecystitis.  I discussed with general surgery Dr. Jens Som  who recommended obtaining a HIDA scan however HIDA scan with EF is not able to be done at this time and if EF is needed to be done will come will be on Tuesday per nursing. So will obtain a HIDA scan without the EF currently.  And this HIDA scan was normal hepatobiliary scan so Dr. Windle Guard was less inclined for acute cholecystitis and felt that her LFTs being elevated could be reactionary.  We will continue to monitor closely and antibiotics have been de-escalated to just IV cefepime Flagyl for now and will continue to de-escalate accordingly.  Echocardiogram was done yesterday.  On 11/12/2018 the patient was lethargic and slower to respond.  Obtained an ABG which showed some mild hypoxemia and ammonia level was 18.  Head CT was done and showed no acute intracranial findings and mild atrophy and white matter microvascular disease.  WBC is slowly trending up so repeat cultures were ordered including a chest x-ray, blood cultures, urinalysis.  Chest x-ray showed minimal bibasilar segment atelectasis.  Patient was complaining of left foot pain so foot x-ray was also obtained findings.  Patient's somnolence does not improve will obtain a neurologic consult in the a.m.  Repeat urinalysis showed cloudy urine and small hemoglobin along with moderate leukocytes, few bacteria and 11-20 BCs.  She was on IV antibiotics.   11/13/2018 remained somnolent (received 10 mg of po Hydrocodone this AM) so narcotics have been adjusted and IV morphine has been discontinued along with p.o. hydrocodone.  We will continue to monitor and  white blood cell count is now trending down but in general surgery is recommending hydrotherapy but no further surgical intervention.  Today 11/14/2018 was still a little lethargic but improving.  Patient started hydrotherapy and antibiotics have been further adjusted from IV cefepime to IV Unasyn.  White blood cell count remains somewhat elevated but could be secondary to pain.  We will continue  monitor patient mental status given her changes in her opiate regimen and may need to adjust further.     Assessment & Plan:   Principal Problem:   Sepsis ([facility]) Active Problems:   Abscess of left buttock   DKA (diabetic ketoacidoses) (HCC)   Acute renal failure superimposed on stage 4 chronic kidney disease (HCC)   Essential hypertension   CAD (coronary artery disease)   GERD (gastroesophageal reflux disease)   Chronic kidney disease (CKD), stage IV (severe) (HCC)   Atrial fibrillation with RVR (HCC)   Elevated troponin   Abnormal LFTs   Acute metabolic encephalopathy   Cellulitis and abscess of buttock  #1 sepsis secondary to left buttocks/gluteal abscess status post incision and drainage of abscess POD #7. Patient was admitted meeting sepsis criteria with fever, leukocytosis, tachycardia(new onset atrial fibrillation).  Sepsis protocol was initiated in the ED.  Sepsis physiology improving as patient currently afebrile.  Tachycardia improving.  Patient still with a leukocytosis which is fluctuating and currently at 22.6 from 24.6.  Blood cultures with no growth to date x5 days.  Urine culture is negative.  MRSA PCR negative.  COVID-19 negative.  Cultures from left buttocks abscess positive for E. coli which is pansensitive.  Patient was seen in consultation by general surgery and underwent incision and drainage of abscess with cultures sent.  Procalcitonin level was 6.89 and trended down to 4.82.  Lactic acid level trended down from 2.9-1.0.  Patient initially placed on IV vancomycin, IV cefepime and IV metronidazole.  Antibiotics narrowed down patient currently on IV Unasyn.  Patient noted to have ongoing leukocytosis repeat urinalysis showed moderate leukocytes, nitrite negative, few bacteria, 11-20 WBCs.  Repeat chest x-ray done 11/12/2018- for any acute infiltrate however consistent with atelectasis.  Patient currently receiving hydrotherapy which was started on 11/14/2018.  Patient also on  twice daily wet-to-dry dressing changes per general surgery.  Continue empiric IV Unasyn.  Supportive care.  Follow.  2.  Diabetes mellitus type 2/DKA Patient had presented in DKA felt secondary to problem #1.  Hemoglobin A1c 8.4.  On admission patient noted to have a beta hydroxybutyric acid of 2.29.  Patient was placed on the glucose stabilizer and subsequently transition to subcutaneous Lantus at 15 units daily as well as sliding scale insulin and meal coverage NovoLog of 6 units 3 times daily with meals.  Patient with poor oral intake.  CBG of 125 this morning.  Continue Lantus 15 units daily and may need to decrease pending blood glucose levels.  Discontinued meal coverage NovoLog.  Continue sliding scale insulin.  Follow.  3.  New onset/Transient atrial fibrillation/atrial flutter with variable block status post conversion to normal sinus rhythm with transient third-degree AV block. Felt likely in the setting of sepsis secondary to problem #1.  Patient converted to normal sinus rhythm with a short episode of third-degree AV block overnight while asleep thought possibly secondary to AV nodal blocking drugs and sleep apnea.  Patient initially on a Cardizem drip and subsequently transitioned to oral Cardizem which has subsequently been discontinued.  Patient seen in consultation by cardiology who feels no need  for anticoagulation at this time given short duration and need for surgical intervention.  Continue current regimen of Toprol-XL 50 mg daily.  Appreciate cardiology input and recommendations.  4.  Nausea Patient with complaints of nausea yesterday morning which has since resolved. Patient also with some complaints of constipation not sure the last time she had bowel movement.  Continue daily Dulcolax suppositories.  Patient given soapsuds enema x1 yesterday however no bowel movement.  Continue MiraLAX twice daily.  May need another enema if no bowel movement on current regimen.  Follow.  5.   Hypertension Blood pressure still somewhat elevated.  Continue Toprol-XL, clonidine.  Increase Norvasc to 5 mg daily.  Follow.  6.  Elevated troponin Felt secondary to demand ischemia in the setting of sepsis, elevated lactic acid and acute on chronic kidney failure.  EKG with question of flutter versus sinus tach with PACs.  Repeat EKG showed a normal sinus rhythm and evidence of LVH with no signs of ischemia noted.  Patient seen by cardiology who felt no further ischemic work-up needed at this time.  Continue aspirin 81 mg daily, Toprol-XL, statin.  Follow.  7.  Acute on chronic kidney disease stage IV/chronic metabolic acidosis Felt secondary to prerenal azotemia in the setting of sepsis.  Patient on admission noted to have a creatinine of 4.76.  Patient placed on IV fluids which have subsequently been discontinued.  Renal function improved and currently close to baseline.  Creatinine at 2.73.  Continue calcitriol.  Changed bicarb tablets to 1300 mg twice daily and follow may increase up to 3 times daily if needed.  Outpatient follow-up with nephrology.  8.  Hypothyroidism TSH of 1.815 on 11/09/2018.  Continue home dose Synthroid.  Outpatient follow-up.  9.  Coronary artery disease Currently stable.  Patient denies any acute chest pain.  Patient was seen by cardiology during the hospitalization secondary to new onset atrial flutter with variable block.  Continue current cardiac medications of aspirin, Toprol-XL, statin, Norvasc.  Outpatient follow-up with cardiology.  10.  Hyponatremia Improved.  11.  Hypermagnesemia Monitor.  Magnesium level at 2.9.  Follow.  12.  Acute encephalopathy/somnolence and lethargy Questionable etiology.  Likely medication induced.  Patient noted to be somnolent and lethargic however slowly improving and not quite yet at baseline.  Head CT done was negative for any acute abnormalities.  ABG with a pH of 7.383/PCO2 of 27/PO2 of 77.  Chest x-ray negative, blood  cultures negative to date, urine cultures negative.  Patient being treated for left gluteal abscess on empiric IV antibiotics.  Continue to limit sedating medications.  IV morphine has been discontinued.  Patient now on oxycodone.  Patient seems to be slowly improving following commands appropriately and answering some questions.  EEG done consistent with generalized nonspecific cerebral dysfunction/encephalopathy.  No seizures or seizure predisposition recorded on the study.  Monitor for now.  13.  Morbid obesity Weight loss and dietary counseling given.  14.  History of gout Colchicine on hold.  Continue allopurinol.  15.  Abnormal LFTs Likely reactive.  LFTs trended down.  Acute hepatitis panel was negative.  Right upper quadrant ultrasound showed gallbladder sludge and positive sonographic Murphy sign however no biliary duct dilatation.  HIDA scan which was done was normal.  Dr. Alfredia Ferguson discussed the case with general surgery who recommended no further intervention at this time and to continue to trend hepatic function.  16.  Normocytic anemia/anemia of chronic kidney disease Likely dilutional and postoperative drop.  Hemoglobin was 11.4  and dropped to 7.7 today 11/16/2018.  Patient with no overt bleeding. Anemia panel consistent with anemia of chronic disease.  Follow H&H.  Transfusion threshold hemoglobin less than 7.  17.  Leukocytosis Likely secondary to problem #1.  WBC was fluctuating however started to trend down.  Patient afebrile.  Patient has been pancultured with blood cultures with no growth to date, urine culture is negative, chest x-ray with no acute infiltrate. COVID-19 negative. Patient with no respiratory symptoms.  Patient with left gluteal abscess that has been I&D and cultures positive for E. coli.  Patient currently on IV Unasyn which we will continue for now.  Follow.   18. gastroesophageal reflux disease Continue PPI.      DVT prophylaxis: SCDs Code Status:  Full Family Communication: Updated patient.  No family at bedside. Disposition Plan: Transfer to [facility].  Likely need SNF on discharge.    Consultants:   Cardiology: Dr. Meda Coffee 11/09/2018  General surgery: Dr. Marlou Starks III 11/08/2018  Procedures:   CT head 11/12/2018  CT pelvis 11/08/2018  Chest x-ray 11/08/2018, 11/12/2018, 11/13/2018, 11/15/2018  Plain films of the left foot 11/12/2018  Limited echocardiogram 11/11/2018  Right upper quadrant ultrasound 11/11/2018  Incision and drainage of left buttocks abscess 11/09/2018 per Dr. Ninfa Linden  EEG 11/15/2018  Antimicrobials:   Oral Flagyl 11/13/2018>>>> 11/14/2018  IV Unasyn 11/14/2018>>>>  IV cefepime 11/08/2018>>>> 11/14/2018  IV Flagyl 11/08/2018>>>>> 11/13/2018  IV vancomycin 11/08/2018>>>> 11/11/2018   Subjective: Patient sitting up in bed.  More alert.  Denies any further nausea.  No shortness of breath.  No chest pain.  No abdominal pain.  Still with some rectal pain however is slowly improving.  Has not had breakfast yet.  No bowel movement yesterday.   Objective: Vitals:   11/16/18 0000 11/16/18 0400 11/16/18 0500 11/16/18 0600  BP: 124/65 (!) 146/70 (!) 154/70 (!) 163/73  Pulse: 67 76 80 91  Resp: 18 13 15 18   Temp: 97.7 F (36.5 C) 98.3 F (36.8 C)    TempSrc: Oral Oral    SpO2: 98% 98% 99% 96%  Weight:      Height:        Intake/Output Summary (Last 24 hours) at 11/16/2018 0846 Last data filed at 11/16/2018 0559 Gross per 24 hour  Intake 1120 ml  Output 1500 ml  Net -380 ml   Filed Weights   11/09/18 1254 11/10/18 0500 11/13/18 0400  Weight: 111.6 kg 110.3 kg 113 kg    Examination:  General exam: Appears calm and comfortable  Respiratory system: Lungs clear to auscultation bilaterally.  No wheezes, no crackles, no rhonchi.   Cardiovascular system: Regular rate rhythm no murmurs rubs or gallops.  No JVD.  No lower extremity edema.  Gastrointestinal system: Abdomen is soft, nontender, nondistended, positive bowel  sounds. Central nervous system: Alert. No focal neurological deficits.  Moving extremities spontaneously. Extremities: Symmetric 5 x 5 power. Skin: No rashes, lesions or ulcers Psychiatry: Judgement and insight appear fair. Mood & affect appropriate.     Data Reviewed: I have personally reviewed following labs and imaging studies  CBC: Recent Labs  Lab 11/12/18 0700 11/13/18 0223 11/14/18 0245 11/15/18 0231 11/16/18 0230  WBC 23.2* 21.8* 24.6* 22.6* 19.3*  NEUTROABS 19.3* 16.8* 18.2* 16.5* 14.4*  HGB 8.4* 8.2* 8.7* 8.7* 7.7*  HCT 26.2* 24.6* 27.1* 26.6* 25.0*  MCV 83.2 83.7 83.6 83.6 85.0  PLT 261 301 314 351 761   Basic Metabolic Panel: Recent Labs  Lab 11/12/18 0700 11/13/18 0223 11/14/18 0245  11/15/18 0231 11/16/18 0230  NA 133* 135 138 142 142  K 4.8 4.6 4.6 4.9 4.5  CL 104 108 111 117* 118*  CO2 17* 17* 17* 17* 19*  GLUCOSE 173* 135* 102* 76 107*  BUN 99* 102* 98* 95* 92*  CREATININE 3.15* 2.96* 2.70* 2.71* 2.73*  CALCIUM 8.5* 8.3* 8.5* 8.6* 8.4*  MG 2.5* 2.5* 2.5* 2.7* 2.9*  PHOS 4.5 4.1 4.2 3.9 4.1   GFR: Estimated Creatinine Clearance: 21.2 mL/min (A) (by C-G formula based on SCr of 2.73 mg/dL (H)). Liver Function Tests: Recent Labs  Lab 11/11/18 0323 11/12/18 0700 11/13/18 0223 11/14/18 0245 11/15/18 0231 11/16/18 0230  AST 52* 32 28 27 26   --   ALT 47* 34 26 25 22   --   ALKPHOS 78 85 70 75 68  --   BILITOT 0.7 0.8 0.8 0.6 0.7  --   PROT 5.5* 6.1* 5.5* 6.1* 5.9*  --   ALBUMIN 2.0* 2.2* 2.0* 2.0* 1.9* 1.7*   No results for input(s): LIPASE, AMYLASE in the last 168 hours. Recent Labs  Lab 11/12/18 1015  AMMONIA 18   Coagulation Profile: No results for input(s): INR, PROTIME in the last 168 hours. Cardiac Enzymes: Recent Labs  Lab 11/09/18 1206  TROPONINI 0.07*   BNP (last 3 results) No results for input(s): PROBNP in the last 8760 hours. HbA1C: No results for input(s): HGBA1C in the last 72 hours. CBG: Recent Labs  Lab  11/15/18 1533 11/15/18 1921 11/15/18 2336 11/16/18 0339 11/16/18 0739  GLUCAP 154* 162* 124* 80 125*   Lipid Profile: No results for input(s): CHOL, HDL, LDLCALC, TRIG, CHOLHDL, LDLDIRECT in the last 72 hours. Thyroid Function Tests: No results for input(s): TSH, T4TOTAL, FREET4, T3FREE, THYROIDAB in the last 72 hours. Anemia Panel: Recent Labs    11/14/18 0245  VITAMINB12 548  FOLATE 12.7  FERRITIN 1,253*  TIBC 115*  IRON 33  RETICCTPCT 1.6   Sepsis Labs: Recent Labs  Lab 11/12/18 1015 11/13/18 0223 11/14/18 0245  PROCALCITON 6.13 6.01 4.82    Recent Results (from the past 240 hour(s))  Blood Culture (routine x 2)     Status: None   Collection Time: 11/08/18  3:33 PM  Result Value Ref Range Status   Specimen Description   Final    BLOOD RIGHT ANTECUBITAL Performed at [facility], [place] [address], [place], [place] [address]    Special Requests   Final    BOTTLES DRAWN AEROBIC AND ANAEROBIC Blood Culture results may not be optimal due to an excessive volume of blood received in culture bottles Performed at [facility] [address] [place], [place] [address]    Culture   Final    NO GROWTH 5 DAYS Performed at [facility], [place] [address] [place], [place] [address]    Report Status 11/13/2018 FINAL  Final  Blood Culture (routine x 2)     Status: None   Collection Time: 11/08/18  3:41 PM  Result Value Ref Range Status   Specimen Description   Final    BLOOD LEFT ANTECUBITAL Performed at [facility] [address], [place], [place] [address]    Special Requests   Final    BOTTLES DRAWN AEROBIC AND ANAEROBIC Blood Culture results may not be optimal due to an excessive volume of blood received in culture bottles Performed at [facility] [address], [place], [place] [address]    Culture  Final    NO GROWTH 5 DAYS Performed at [facility], [place] [address]  [place], [place] [address]    Report Status 11/13/2018 FINAL  Final  SARS Coronavirus 2 (CEPHEID- Performed in [facility] hospital lab), Hosp Order     Status: None   Collection Time: 11/08/18  4:07 PM  Result Value Ref Range Status   SARS Coronavirus 2 NEGATIVE NEGATIVE Final    Comment: (NOTE) If result is NEGATIVE SARS-CoV-2 target nucleic acids are NOT DETECTED. The SARS-CoV-2 RNA is generally detectable in upper and lower  respiratory specimens during the acute phase of infection. The lowest  concentration of SARS-CoV-2 viral copies this assay can detect is 250  copies / mL. A negative result does not preclude SARS-CoV-2 infection  and should not be used as the sole basis for treatment or other  patient management decisions.  A negative result may occur with  improper specimen collection / handling, submission of specimen other  than nasopharyngeal swab, presence of viral mutation(s) within the  areas targeted by this assay, and inadequate number of viral copies  (<250 copies / mL). A negative result must be combined with clinical  observations, patient history, and epidemiological information. If result is POSITIVE SARS-CoV-2 target nucleic acids are DETECTED. The SARS-CoV-2 RNA is generally detectable in upper and lower  respiratory specimens dur ing the acute phase of infection.  Positive  results are indicative of active infection with SARS-CoV-2.  Clinical  correlation with patient history and other diagnostic information is  necessary to determine patient infection status.  Positive results do  not rule out bacterial infection or co-infection with other viruses. If result is PRESUMPTIVE POSTIVE SARS-CoV-2 nucleic acids MAY BE PRESENT.   A presumptive positive result was obtained on the submitted specimen  and confirmed on repeat testing.  While 2019 novel coronavirus  (SARS-CoV-2) nucleic acids may be present in the submitted sample  additional confirmatory testing may be  necessary for epidemiological  and / or clinical management purposes  to differentiate between  SARS-CoV-2 and other Sarbecovirus currently known to infect humans.  If clinically indicated additional testing with an alternate test  methodology [phone]) is advised. The SARS-CoV-2 RNA is generally  detectable in upper and lower respiratory sp ecimens during the acute  phase of infection. The expected result is Negative. Fact Sheet for Patients:  StrictlyIdeas.no Fact Sheet for Healthcare Providers: BankingDealers.co.za This test is not yet approved or cleared by the [place] FDA and has been authorized for detection and/or diagnosis of SARS-CoV-2 by FDA under an Emergency Use Authorization (EUA).  This EUA will remain in effect (meaning this test can be used) for the duration of the COVID-19 declaration under Section 564(b)(1) of the Act, 21 U.S.C. section 360bbb-3(b)(1), unless the authorization is terminated or revoked sooner. Performed at [facility], [place] [address], [place], [place] [address]   MRSA PCR Screening     Status: None   Collection Time: 11/08/18  8:54 PM  Result Value Ref Range Status   MRSA by PCR NEGATIVE NEGATIVE Final    Comment:        The GeneXpert MRSA Assay (FDA approved for NASAL specimens only), is one component of a comprehensive MRSA colonization surveillance program. It is not intended to diagnose MRSA infection nor to guide or monitor treatment for MRSA infections. Performed at [facility], [place] [address] [place],  [address]   Aerobic/Anaerobic Culture (surgical/deep wound)  Status: None   Collection Time: 11/09/18  2:06 PM  Result Value Ref Range Status   Specimen Description   Final    ABSCESS LEFT BUTTOCKS Performed at [facility] [address] [place], [place] [address]    Special Requests   Final    NONE Performed  at [facility], [place] [address], [place], [place] [address]    Gram Stain   Final    ABUNDANT WBC PRESENT,BOTH PMN AND MONONUCLEAR ABUNDANT GRAM POSITIVE COCCI ABUNDANT GRAM NEGATIVE RODS    Culture   Final    FEW ESCHERICHIA COLI FEW BACTEROIDES SPECIES BETA LACTAMASE POSITIVE FEW ACTINOMYCES SPECIES Standardized susceptibility testing for this organism is not available. Performed at [facility], [place] [address] [place], [place] [address]    Report Status 11/15/2018 FINAL  Final   Organism ID, Bacteria ESCHERICHIA COLI  Final      Susceptibility   Escherichia coli - MIC*    AMPICILLIN <=2 SENSITIVE Sensitive     CEFAZOLIN <=4 SENSITIVE Sensitive     CEFEPIME <=1 SENSITIVE Sensitive     CEFTAZIDIME <=1 SENSITIVE Sensitive     CEFTRIAXONE <=1 SENSITIVE Sensitive     CIPROFLOXACIN <=0.25 SENSITIVE Sensitive     GENTAMICIN <=1 SENSITIVE Sensitive     IMIPENEM <=0.25 SENSITIVE Sensitive     TRIMETH/SULFA <=20 SENSITIVE Sensitive     AMPICILLIN/SULBACTAM <=2 SENSITIVE Sensitive     PIP/TAZO <=4 SENSITIVE Sensitive     Extended ESBL NEGATIVE Sensitive     * FEW ESCHERICHIA COLI  Urine Culture     Status: None   Collection Time: 11/10/18 12:30 PM  Result Value Ref Range Status   Specimen Description   Final    URINE, RANDOM Performed at [facility] [address] [place], [place] [address]    Special Requests   Final    NONE Performed at [facility], [place] [address] [place], [place] [address]    Culture   Final    NO GROWTH Performed at [facility], [place] [address] [place], [place] [address]    Report Status 11/11/2018 FINAL  Final         Radiology Studies: Dg Chest Port 1 View  Result Date: 11/15/2018 CLINICAL DATA:  78 year old female with shortness of breath. Sepsis. Negative for COVID-19 on 11/08/2018. EXAM: PORTABLE CHEST 1 VIEW COMPARISON:  11/14/2018 and earlier. FINDINGS: Portable AP  semi upright view at 0438 hours. Continued somewhat low lung volumes. Mediastinal contours remain normal. No pneumothorax, pulmonary edema or pleural effusion. Regressed patchy lung base opacity, no confluent opacity today. Negative visible bowel gas pattern. No acute osseous abnormality identified. IMPRESSION: Decreasing lung base atelectasis and no new cardiopulmonary abnormality. Electronically Signed   By: Genevie Ann M.D.   On: 11/15/2018 06:16        Scheduled Meds:  allopurinol  300 mg Oral Daily   amLODipine  5 mg Oral Daily   aspirin EC  81 mg Oral Daily   bisacodyl  10 mg Rectal Daily   calcitRIOL  0.25 mcg Oral Daily   Chlorhexidine Gluconate Cloth  6 each Topical Daily   cloNIDine  0.1 mg Oral BID   insulin aspart  0-15 Units Subcutaneous Q4H   insulin glargine  15 Units Subcutaneous Daily   levothyroxine  50 mcg Oral Q0600   mouth rinse  15 mL Mouth Rinse BID   metoprolol succinate  50 mg Oral  Daily   pantoprazole  40 mg Oral Q0600   pneumococcal 23 valent vaccine  0.5 mL Intramuscular Tomorrow-1000   polyethylene glycol  17 g Oral BID   pravastatin  80 mg Oral QHS   sodium bicarbonate  1,300 mg Oral BID   Continuous Infusions:  ampicillin-sulbactam (UNASYN) IV Stopped (11/15/18 2226)     LOS: 8 days    Time spent: 40 minutes    Irine Seal, MD [facility]  If 7PM-7AM, please contact night-coverage [email or web address] 11/16/2018, 8:46 AM

## 2018-11-16 NOTE — Progress Notes (Signed)
Hydrotherapy Treatment Note    11/16/18 1300  Subjective Assessment  Subjective Pt moaning during pulsatile lavage and requested one rest break.  Date of Onset  (abscess present on admission)  Prior Treatments s/p I&D 5/7  Evaluation and Treatment  Evaluation and Treatment Procedures Explained to Patient/Family Yes  Evaluation and Treatment Procedures agreed to  Wound / Incision (Open or Dehisced) 11/13/18 Incision - Open Buttocks Left L buttocks wound s/p I&D by Surgery 11/09/18. **HYDRO/PT**  Date First Assessed/Time First Assessed: 11/13/18 1500   Wound Type: Incision - Open  Location: Buttocks  Location Orientation: Left  Wound Description (Comments): L buttocks wound s/p I&D by Surgery 11/09/18. **HYDRO/PT**  Dressing Type Barrier Film (skin prep);Gauze (Comment);Moist to dry;ABD;Impregnated gauze (petrolatum) (petrolatum on periwound)  Dressing Changed Changed  Dressing Status Old drainage  Dressing Change Frequency Twice a day  Site / Wound Assessment Bleeding;Pink;Red;Yellow;Brown;Black  % Wound base Red or Granulating 60%  % Wound base Yellow/Fibrinous Exudate 20%  % Wound base Black/Eschar 20%  Peri-wound Assessment Excoriated;Bleeding;Pink  Margins Unattached edges (unapproximated)  Drainage Amount Moderate  Drainage Description Serosanguineous  Treatment Debridement (Selective);Hydrotherapy (Pulse lavage);Packing (Saline gauze)  Hydrotherapy  Pulsed lavage therapy - wound location L medial buttocks  Pulsed Lavage with Suction (psi) 8 psi  Pulsed Lavage with Suction - Normal Saline Used 1000 mL  Pulsed Lavage Tip Tip with splash shield  Selective Debridement  Selective Debridement - Location L buttock  Selective Debridement - Tools Used Forceps;Scissors  Selective Debridement - Tissue Removed brown/black tissue and yellow slough  Wound Therapy - Assess/Plan/Recommendations  Wound Therapy - Clinical Statement 78 yo female admitted with L gluteal/buttock abscess. S/P I&D  11/09/18.   Factors Delaying/Impairing Wound Healing Diabetes Mellitus;Multiple medical problems;Immobility  Hydrotherapy Plan Debridement;Dressing change;Patient/family education;Pulsatile lavage with suction  Wound Therapy - Frequency 6X / week  Wound Therapy - Current Recommendations Case manager/social work  Wound Therapy - Follow Up Recommendations Skilled nursing facility;Home health RN  Wound Plan Will plan hydrotherapy and dressing changes 6x/week. Pt requires pre-medication 2*pain.   Wound Therapy Goals - Improve the function of patient's integumentary system by progressing the wound(s) through the phases of wound healing by:  Decrease Necrotic Tissue to 20%  Decrease Necrotic Tissue - Progress Progressing toward goal  Increase Granulation Tissue to 80%  Increase Granulation Tissue - Progress Progressing toward goal  Goals/treatment plan/discharge plan were made with and agreed upon by patient/family Yes  Time For Goal Achievement 2 weeks  Wound Therapy - Potential for Goals Good     Time: [place], PT, [place] Office: [phone] Pager: [phone]

## 2018-11-17 LAB — RENAL FUNCTION PANEL
Albumin: 2 g/dL — ABNORMAL LOW (ref 3.5–5.0)
Anion gap: 11 (ref 5–15)
BUN: 84 mg/dL — ABNORMAL HIGH (ref 8–23)
CO2: 16 mmol/L — ABNORMAL LOW (ref 22–32)
Calcium: 8.8 mg/dL — ABNORMAL LOW (ref 8.9–10.3)
Chloride: 110 mmol/L (ref 98–111)
Creatinine, Ser: 2.53 mg/dL — ABNORMAL HIGH (ref 0.44–1.00)
GFR calc Af Amer: 21 mL/min — ABNORMAL LOW (ref >60)
GFR calc non Af Amer: 18 mL/min — ABNORMAL LOW (ref >60)
Glucose, Bld: 178 mg/dL — ABNORMAL HIGH (ref 70–99)
Phosphorus: 4.2 mg/dL (ref 2.5–4.6)
Potassium: 5.7 mmol/L — ABNORMAL HIGH (ref 3.5–5.1)
Sodium: 137 mmol/L (ref 135–145)

## 2018-11-17 LAB — CBC WITH DIFFERENTIAL/PLATELET
Abs Immature Granulocytes: 1.05 10*3/uL — ABNORMAL HIGH (ref 0.00–0.07)
Basophils Absolute: 0.1 10*3/uL (ref 0.0–0.1)
Basophils Relative: 0 %
Eosinophils Absolute: 0.2 10*3/uL (ref 0.0–0.5)
Eosinophils Relative: 1 %
HCT: 27.8 % — ABNORMAL LOW (ref 36.0–46.0)
Hemoglobin: 8.9 g/dL — ABNORMAL LOW (ref 12.0–15.0)
Immature Granulocytes: 5 %
Lymphocytes Relative: 9 %
Lymphs Abs: 1.9 10*3/uL (ref 0.7–4.0)
MCH: 26.5 pg (ref 26.0–34.0)
MCHC: 32 g/dL (ref 30.0–36.0)
MCV: 82.7 fL (ref 80.0–100.0)
Monocytes Absolute: 1.2 10*3/uL — ABNORMAL HIGH (ref 0.1–1.0)
Monocytes Relative: 5 %
Neutro Abs: 17.7 10*3/uL — ABNORMAL HIGH (ref 1.7–7.7)
Neutrophils Relative %: 80 %
Platelets: 391 10*3/uL (ref 150–400)
RBC: 3.36 MIL/uL — ABNORMAL LOW (ref 3.87–5.11)
RDW: 15.3 % (ref 11.5–15.5)
WBC: 22.1 10*3/uL — ABNORMAL HIGH (ref 4.0–10.5)
nRBC: 0.1 % (ref 0.0–0.2)

## 2018-11-17 LAB — GLUCOSE, CAPILLARY
Glucose-Capillary: 147 mg/dL — ABNORMAL HIGH (ref 70–99)
Glucose-Capillary: 166 mg/dL — ABNORMAL HIGH (ref 70–99)
Glucose-Capillary: 182 mg/dL — ABNORMAL HIGH (ref 70–99)
Glucose-Capillary: 256 mg/dL — ABNORMAL HIGH (ref 70–99)
Glucose-Capillary: 232 mg/dL — ABNORMAL HIGH (ref 70–99)

## 2018-11-17 MED ORDER — PATIROMER SORBITEX CALCIUM 8.4 G PO PACK
8.4000 g | PACK | Freq: Every day | ORAL | Status: DC
  Administered 2018-11-17 – 2018-11-28 (×11): 8.4 g via ORAL
  Filled 2018-11-17 (×16): qty 1

## 2018-11-17 MED ORDER — SODIUM BICARBONATE 650 MG PO TABS
1300.0000 mg | ORAL_TABLET | Freq: Three times a day (TID) | ORAL | Status: DC
  Administered 2018-11-17 – 2018-11-29 (×37): 1300 mg via ORAL
  Filled 2018-11-17 (×37): qty 2

## 2018-11-17 NOTE — Care Management Important Message (Signed)
Important Message  Patient Details IM Letter given to Servando Snare SW to present to the Patient Name: Emily Hughes MRN: [id number] Date of Birth: 03-15-1941   Medicare Important Message Given:  Yes    [name] 11/17/2018, 10:19 AM

## 2018-11-17 NOTE — Progress Notes (Signed)
 ---  PT  HYDROTHERAPY TREATMENT NOTE ----11/17/18 1400  Subjective Assessment  Subjective some c/o pain with hydro, pt was premedicated  Patient and Family Stated Goals  (none stated)  Date of Onset  (present on admission)  Prior Treatments s/p I&D 5/7  Evaluation and Treatment  Evaluation and Treatment Procedures Explained to Patient/Family Yes  Evaluation and Treatment Procedures agreed to  Wound / Incision (Open or Dehisced) 11/13/18 Incision - Open Buttocks Left L buttocks wound s/p I&D by Surgery 11/09/18. **HYDRO/PT**  Date First Assessed/Time First Assessed: 11/13/18 1500   Wound Type: Incision - Open  Location: Buttocks  Location Orientation: Left  Wound Description (Comments): L buttocks wound s/p I&D by Surgery 11/09/18. **HYDRO/PT**  Dressing Type Barrier Film (skin prep);Gauze (Comment);Moist to dry;ABD;Impregnated gauze (petrolatum)  Dressing Changed Changed  Dressing Change Frequency Twice a day  Site / Wound Assessment Black;Brown;Yellow;Pink  % Wound base Red or Granulating 65%  % Wound base Yellow/Fibrinous Exudate 20%  % Wound base Black/Eschar 15%  Peri-wound Assessment Intact (mild excoriation medial wound)  Drainage Amount Moderate  Drainage Description Serosanguineous  Treatment Hydrotherapy (Pulse lavage);Cleansed;Debridement (Selective);Packing (Saline gauze)  Hydrotherapy  Pulsed lavage therapy - wound location L medial buttocks  Pulsed Lavage with Suction (psi) 8 psi  Pulsed Lavage with Suction - Normal Saline Used 1000 mL  Pulsed Lavage Tip Tip with splash shield  Selective Debridement  Selective Debridement - Location L buttock  Selective Debridement - Tools Used Forceps;Scissors  Selective Debridement - Tissue Removed brown/black tissue and yellow slough  Wound Therapy - Assess/Plan/Recommendations  Wound Therapy - Clinical Statement 78 yo female admitted with L gluteal/buttock abscess. S/P I&D 11/09/18.   Wound Therapy - Functional Problem List limited  mobility  Factors Delaying/Impairing Wound Healing Diabetes Mellitus;Multiple medical problems;Immobility;Incontinence  Hydrotherapy Plan Debridement;Dressing change;Patient/family education;Pulsatile lavage with suction  Wound Therapy - Frequency 6X / week  Wound Therapy - Follow Up Recommendations Skilled nursing facility  Wound Plan dressing changes over weekend per nursing, Hydrotherapy re-check on Monday and determine needs--possible ?MWF  Wound Therapy Goals - Improve the function of patient's integumentary system by progressing the wound(s) through the phases of wound healing by:  Decrease Necrotic Tissue to 20%  Decrease Necrotic Tissue - Progress Progressing toward goal  Increase Granulation Tissue to 80%  Increase Granulation Tissue - Progress Progressing toward goal  Time For Goal Achievement 2 weeks  Wound Therapy - Potential for Goals Good   Kenyon Ana, PT  Pager: [phone] Acute Rehab Dept [facility]): [phone]   11/17/2018

## 2018-11-17 NOTE — Evaluation (Signed)
Physical Therapy Evaluation Patient Details Name: Emily Hughes MRN: [id number] DOB: [birth year] Today's Date: 11/17/2018   History of Present Illness  78 year old female admitted for wound infection and hyperglycemia. PMH:  DM, CKD, A Fib, CAD, HTN and seizure disorder  Clinical Impression  Pt admitted with above diagnosis. Pt currently with functional limitations due to the deficits listed below (see PT Problem List).  Pt cooperative with encouragement, requiring +2 max assist for bed mobility. Will follow in acute setting. Recommend SNF post acute   Pt will benefit from skilled PT to increase their independence and safety with mobility to allow discharge to the venue listed below.       Follow Up Recommendations SNF    Equipment Recommendations  None recommended by PT    Recommendations for Other Services       Precautions / Restrictions Precautions Precautions: Fall Precaution Comments: gout pain in R foot Restrictions Weight Bearing Restrictions: No      Mobility  Bed Mobility Overal bed mobility: Needs Assistance Bed Mobility: Sit to Supine Rolling: Max assist;+2 for physical assistance   Supine to sit: Max assist;+2 for physical assistance Sit to supine: Max assist;+2 for physical assistance Sit to sidelying: Max assist;+2 for physical assistance General bed mobility comments: assist for trunk and legs in both directions, incr time, cues to assist self   Transfers                 General transfer comment: not attempted d/t LE and bil foot pain  Ambulation/Gait                Stairs            Wheelchair Mobility    Modified Rankin (Stroke Patients Only)       Balance Overall balance assessment: Needs assistance Sitting-balance support: No upper extremity supported;Feet unsupported Sitting balance-Leahy Scale: Fair                                       Pertinent Vitals/Pain Pain Assessment: Faces Faces Pain  Scale: Hurts worst Pain Location: R foot with movement; L as well, but not as severe Pain Descriptors / Indicators: Aching;Tingling Pain Intervention(s): Limited activity within patient's tolerance;Monitored during session;Repositioned    Home Living Family/patient expects to be discharged to:: Skilled nursing facility Living Arrangements: Alone               Additional Comments: lived alone at baseline    Prior Function Level of Independence: Independent with assistive device(s)         Comments: uses walker     Hand Dominance   Dominant Hand: Right    Extremity/Trunk Assessment   Upper Extremity Assessment Upper Extremity Assessment: Generalized weakness;Defer to OT evaluation    Lower Extremity Assessment Lower Extremity Assessment: Generalized weakness       Communication   Communication: No difficulties  Cognition Arousal/Alertness: Awake/alert Behavior During Therapy: WFL for tasks assessed/performed Overall Cognitive Status: Within Functional Limits for tasks assessed Area of Impairment: Following commands                       Following Commands: Follows one step commands with increased time       General Comments: likely baseline      General Comments General comments (skin integrity, edema, etc.): posted positioning on white board. Issued level one theraband  for RUE FF and elbow flexion    Exercises     Assessment/Plan    PT Assessment Patient needs continued PT services  PT Problem List Decreased strength;Decreased mobility;Decreased activity tolerance       PT Treatment Interventions DME instruction;Gait training;Functional mobility training;Therapeutic activities;Therapeutic exercise;Patient/family education    PT Goals (Current goals can be found in the Care Plan section)  Acute Rehab PT Goals Patient Stated Goal: none stated; agreeable to snf PT Goal Formulation: With patient Time For Goal Achievement:  12/01/18 Potential to Achieve Goals: Good    Frequency Min 2X/week   Barriers to discharge        Co-evaluation   Reason for Co-Treatment: For patient/therapist safety PT goals addressed during session: Mobility/safety with mobility OT goals addressed during session: Strengthening/ROM       AM-PAC PT "6 Clicks" Mobility  Outcome Measure Help needed turning from your back to your side while in a flat bed without using bedrails?: Total Help needed moving from lying on your back to sitting on the side of a flat bed without using bedrails?: Total Help needed moving to and from a bed to a chair (including a wheelchair)?: Total Help needed standing up from a chair using your arms (e.g., wheelchair or bedside chair)?: Total Help needed to walk in hospital room?: Total Help needed climbing 3-5 steps with a railing? : Total 6 Click Score: 6    End of Session   Activity Tolerance: Patient limited by pain Patient left: in bed;with call bell/phone within reach Nurse Communication: Mobility status      Time: 0037-0488 PT Time Calculation (min) (ACUTE ONLY): 22 min   Charges:   PT Evaluation $PT Eval Moderate Complexity: 1 Mod          Kenyon Ana, PT  Pager: [phone] Acute Rehab Dept [facility]): [phone]   11/17/2018   [facility] 11/17/2018, 3:04 PM

## 2018-11-17 NOTE — Progress Notes (Signed)
PROGRESS NOTE    Emily Hughes  ZOX:[id number] DOB: [birth year] DOA: 11/08/2018 PCP: Susy Frizzle, MD   Brief Narrative:  HPI per Dr. Toy Baker on 11/08/2018  Patient is a 78 year old African-American female with a past medical history significant for but not limited to diabetes mellitus type 2, CKD stage IV, hypothyroidism, CAD, GERD, hypertension, hyperlipidemia, history of seizure disorder, ? Atrial fibrillation, long with comorbidities who presented to the emergency room with a 3-day history of pain and swelling in her buttocks along with a fever of 102.  Family reportedly gave her some Tylenol that helped her improve her temperature down to 99 but she is found to have elevated blood pressures for last few days when in the 400s.  She has not been taking her insulin for last 2 days and has not been checking her glucose regularly.  On admission she stated that the left buttock pain lasted for last few days and was severe 10 out of 10 and reportedly was a boil that then progressed.  She was worked up and admitted for sepsis secondary to abscess of the left buttock as well as DKA.  She was placed in the stepdown unit and was placed on glucose stabilizer and has improvement in her blood sugars.  She was incidentally noted to be in atrial fibrillation/A Flutter which then converted to NSR a few days ago.  Cardiology was consulted for preoperative clearance and patient was taken for incision and drainage of her abscess on her left buttock and she is POD2. DKA improved and she was transitioned to Long Acting Insulin even though Her CO2 is not >20 given that she has chronic Metabolic Acidosis 2/2 to her Renal Disease and is on Sodium Bicarbonate. She tolerated her Diet without issues but complained of some Abdominal Pain.   Liver function tests were elevated and right upper quadrant ultrasound was done and showed possible acute cholecystitis.  I discussed with general surgery Dr. Jens Som  who recommended obtaining a HIDA scan however HIDA scan with EF is not able to be done at this time and if EF is needed to be done will come will be on Tuesday per nursing. So will obtain a HIDA scan without the EF currently.  And this HIDA scan was normal hepatobiliary scan so Dr. Windle Guard was less inclined for acute cholecystitis and felt that her LFTs being elevated could be reactionary.  We will continue to monitor closely and antibiotics have been de-escalated to just IV cefepime Flagyl for now and will continue to de-escalate accordingly.  Echocardiogram was done yesterday.  On 11/12/2018 the patient was lethargic and slower to respond.  Obtained an ABG which showed some mild hypoxemia and ammonia level was 18.  Head CT was done and showed no acute intracranial findings and mild atrophy and white matter microvascular disease.  WBC is slowly trending up so repeat cultures were ordered including a chest x-ray, blood cultures, urinalysis.  Chest x-ray showed minimal bibasilar segment atelectasis.  Patient was complaining of left foot pain so foot x-ray was also obtained findings.  Patient's somnolence does not improve will obtain a neurologic consult in the a.m.  Repeat urinalysis showed cloudy urine and small hemoglobin along with moderate leukocytes, few bacteria and 11-20 BCs.  She was on IV antibiotics.   11/13/2018 remained somnolent (received 10 mg of po Hydrocodone this AM) so narcotics have been adjusted and IV morphine has been discontinued along with p.o. hydrocodone.  We will continue to monitor and  white blood cell count is now trending down but in general surgery is recommending hydrotherapy but no further surgical intervention.  Today 11/14/2018 was still a little lethargic but improving.  Patient started hydrotherapy and antibiotics have been further adjusted from IV cefepime to IV Unasyn.  White blood cell count remains somewhat elevated but could be secondary to pain.  We will continue  monitor patient mental status given her changes in her opiate regimen and may need to adjust further.     Assessment & Plan:   Principal Problem:   Sepsis ([facility]) Active Problems:   Abscess of left buttock   DKA (diabetic ketoacidoses) (HCC)   Acute renal failure superimposed on stage 4 chronic kidney disease (HCC)   Essential hypertension   CAD (coronary artery disease)   GERD (gastroesophageal reflux disease)   Chronic kidney disease (CKD), stage IV (severe) (HCC)   Atrial fibrillation with RVR (HCC)   Elevated troponin   Abnormal LFTs   Acute metabolic encephalopathy   Cellulitis and abscess of buttock  #1 sepsis secondary to left buttocks/gluteal abscess status post incision and drainage of abscess POD #8. Patient was admitted meeting sepsis criteria with fever, leukocytosis, tachycardia(new onset atrial fibrillation).  Sepsis protocol was initiated in the ED.  Sepsis physiology improving as patient currently afebrile.  Tachycardia improving.  Patient still with a leukocytosis which is fluctuating and currently at 22.6 from 24.6.  Blood cultures with no growth to date x5 days.  Urine culture is negative.  MRSA PCR negative.  COVID-19 negative.  Cultures from left buttocks abscess positive for E. coli which is pansensitive.  Patient was seen in consultation by general surgery and underwent incision and drainage of abscess with cultures sent.  Procalcitonin level was 6.89 and trended down to 4.82.  Lactic acid level trended down from 2.9-1.0.  Patient initially placed on IV vancomycin, IV cefepime and IV metronidazole.  Antibiotics narrowed down patient currently on IV Unasyn.  Patient noted to have ongoing leukocytosis repeat urinalysis showed moderate leukocytes, nitrite negative, few bacteria, 11-20 WBCs.  Repeat chest x-ray done 11/12/2018- for any acute infiltrate however consistent with atelectasis.  Patient currently receiving hydrotherapy which was started on 11/14/2018.  Patient also on  twice daily wet-to-dry dressing changes per general surgery.  Continue empiric IV Unasyn and could likely discontinue Unasyn in the next 1 to 2 days.  Per general surgery.Follow.  2.  Diabetes mellitus type 2/DKA Patient had presented in DKA felt secondary to problem #1.  Hemoglobin A1c 8.4.  On admission patient noted to have a beta hydroxybutyric acid of 2.29.  Patient was placed on the glucose stabilizer and subsequently transition to subcutaneous Lantus at 15 units daily as well as sliding scale insulin and meal coverage NovoLog of 6 units 3 times daily with meals.  Patient with poor oral intake.  CBG of 166 this morning.  Continue Lantus 15 units daily and may need to decrease pending blood glucose levels.  Discontinued meal coverage NovoLog.  Continue sliding scale insulin.  Follow.  3.  New onset/Transient atrial fibrillation/atrial flutter with variable block status post conversion to normal sinus rhythm with transient third-degree AV block. Felt likely in the setting of sepsis secondary to problem #1.  Patient converted to normal sinus rhythm with a short episode of third-degree AV block overnight while asleep thought possibly secondary to AV nodal blocking drugs and sleep apnea.  Patient initially on a Cardizem drip and subsequently transitioned to oral Cardizem which has subsequently been discontinued.  Patient seen in consultation by cardiology who feels no need for anticoagulation at this time given short duration and need for surgical intervention.  Continue current regimen of Toprol-XL 50 mg daily.  Appreciate cardiology input and recommendations.  4.  Nausea Patient with complaints of nausea yesterday morning which has since resolved. Patient also with some complaints of constipation not sure the last time she had bowel movement.  Continue daily Dulcolax suppositories.  Patient given soapsuds enema x1 yesterday however no bowel movement.  Patient noted to have multiple loose stools today.   Discontinue scheduled Dulcolax suppositories and MiraLAX.  Follow.  5.  Hypertension Blood pressure improved.  Continue Toprol-XL, clonidine, Norvasc.  Follow.    6.  Elevated troponin Felt secondary to demand ischemia in the setting of sepsis, elevated lactic acid and acute on chronic kidney failure.  EKG with question of flutter versus sinus tach with PACs.  Repeat EKG showed a normal sinus rhythm and evidence of LVH with no signs of ischemia noted.  Patient seen by cardiology who felt no further ischemic work-up needed at this time.  Continue aspirin 81 mg daily, Toprol-XL, statin.  Follow.  7.  Acute on chronic kidney disease stage IV/chronic metabolic acidosis Felt secondary to prerenal azotemia in the setting of sepsis.  Patient on admission noted to have a creatinine of 4.76.  Patient placed on IV fluids which have subsequently been discontinued.  Renal function improved and currently close to baseline.  Creatinine at 2.73.  Continue calcitriol.  Increase bicarb tablets to 1300 mg p.o. 3 times daily.  Outpatient follow-up with nephrology.   8.  Hypothyroidism TSH of 1.815 on 11/09/2018.  Continue Synthroid.  Outpatient follow-up.  9.  Coronary artery disease Currently stable.  Patient denies any acute chest pain.  Patient was seen by cardiology during the hospitalization secondary to new onset atrial flutter with variable block.  Continue current cardiac medications of aspirin, Toprol-XL, statin, Norvasc.  Outpatient follow-up with cardiology.  10.  Hyponatremia Improved.  11.  Hypermagnesemia Monitor.  Magnesium level at 2.9.  Follow.  12.  Acute encephalopathy/somnolence and lethargy Questionable etiology.  Likely medication induced.  Patient noted to be somnolent and lethargic however improving daily, patient answering questions.  Patient following commands.  Getting close to baseline.  Head CT done was negative for any acute abnormalities.  ABG with a pH of 7.383/PCO2 of 27/PO2 of  77.  Chest x-ray negative, blood cultures negative to date, urine cultures negative.  Patient being treated for left gluteal abscess on empiric IV antibiotics.  Continue to limit sedating medications.  IV morphine has been discontinued.  Patient now on oxycodone.  Patient seems to be slowly improving following commands appropriately and answering some questions.  EEG done consistent with generalized nonspecific cerebral dysfunction/encephalopathy.  No seizures or seizure predisposition recorded on the study.  Monitor for now.  13.  Morbid obesity Weight loss and dietary counseling given.  14.  History of gout Colchicine on hold.  Continue allopurinol.  15.  Abnormal LFTs Likely reactive.  LFTs trended down.  Acute hepatitis panel was negative.  Right upper quadrant ultrasound showed gallbladder sludge and positive sonographic Murphy sign however no biliary duct dilatation.  HIDA scan which was done was normal.  Dr. Alfredia Ferguson discussed the case with general surgery who recommended no further intervention at this time and to continue to trend hepatic function.  16.  Normocytic anemia/anemia of chronic kidney disease Likely dilutional and postoperative drop.  Hemoglobin was 11.4 and  dropped to 7.7 and now at 8.9.  Patient with no overt bleeding. Anemia panel consistent with anemia of chronic disease.  Follow H&H.  Transfusion threshold hemoglobin less than 7.  17.  Leukocytosis Likely secondary to problem #1.  WBC was fluctuating.  Patient afebrile.  Patient has been pancultured with blood cultures with no growth to date, urine culture is negative, chest x-ray with no acute infiltrate. COVID-19 negative. Patient with no respiratory symptoms.  Patient with left gluteal abscess that has been I&D and cultures positive for E. coli.  Patient currently on IV Unasyn which we will discontinue tomorrow. Follow.   18. gastroesophageal reflux disease PPI.  19.  Loose stools Patient noted to have loose stools per  RN.  Discontinue Dulcolax and MiraLAX.  Follow.  20.  Hyperkalemia Resume home regimen of Veltassa.    DVT prophylaxis: SCDs Code Status: Full Family Communication: Updated patient.  No family at bedside. Disposition Plan: Likely need SNF on discharge.    Consultants:   Cardiology: Dr. Meda Coffee 11/09/2018  General surgery: Dr. Marlou Starks III 11/08/2018  Procedures:   CT head 11/12/2018  CT pelvis 11/08/2018  Chest x-ray 11/08/2018, 11/12/2018, 11/13/2018, 11/15/2018  Plain films of the left foot 11/12/2018  Limited echocardiogram 11/11/2018  Right upper quadrant ultrasound 11/11/2018  Incision and drainage of left buttocks abscess 11/09/2018 per Dr. Ninfa Linden  EEG 11/15/2018  Antimicrobials:   Oral Flagyl 11/13/2018>>>> 11/14/2018  IV Unasyn 11/14/2018>>>>  IV cefepime 11/08/2018>>>> 11/14/2018  IV Flagyl 11/08/2018>>>>> 11/13/2018  IV vancomycin 11/08/2018>>>> 11/11/2018   Subjective: Patient sitting up in bed watching cartoons.  Denies chest pain or shortness of breath.  No abdominal pain.  Rectal pain slowly improving.  Per RN patient with multiple loose stools.    Objective: Vitals:   11/16/18 2308 11/17/18 0444 11/17/18 0837 11/17/18 0910  BP: 139/70 137/74    Pulse: 81 80    Resp: 20 20    Temp: 98.6 F (37 C) 98.6 F (37 C)    TempSrc: Oral Oral    SpO2: 99% 98%  98%  Weight:   115 kg   Height:        Intake/Output Summary (Last 24 hours) at 11/17/2018 1220 Last data filed at 11/17/2018 0511 Gross per 24 hour  Intake 553.79 ml  Output 1400 ml  Net -846.21 ml   Filed Weights   11/10/18 0500 11/13/18 0400 11/17/18 0837  Weight: 110.3 kg 113 kg 115 kg    Examination:  General exam: NAD Respiratory system: CTAB.  Cardiovascular system: RRR no murmurs rubs or gallops.  No JVD.  No lower extremity edema.   Gastrointestinal system: Abdomen is nontender, nondistended, soft, positive bowel sounds.  No rebound.  No guarding.  Central nervous system: Alert. No focal neurological  deficits.  Moving extremities spontaneously. Extremities: Symmetric 5 x 5 power. Skin: No rashes, lesions or ulcers Psychiatry: Judgement and insight appear fair. Mood & affect appropriate.     Data Reviewed: I have personally reviewed following labs and imaging studies  CBC: Recent Labs  Lab 11/13/18 0223 11/14/18 0245 11/15/18 0231 11/16/18 0230 11/16/18 1412 11/17/18 0645  WBC 21.8* 24.6* 22.6* 19.3*  --  22.1*  NEUTROABS 16.8* 18.2* 16.5* 14.4*  --  17.7*  HGB 8.2* 8.7* 8.7* 7.7* 8.4* 8.9*  HCT 24.6* 27.1* 26.6* 25.0* 26.2* 27.8*  MCV 83.7 83.6 83.6 85.0  --  82.7  PLT 301 314 351 336  --  081   Basic Metabolic Panel: Recent Labs  Lab  11/12/18 0700 11/13/18 0223 11/14/18 0245 11/15/18 0231 11/16/18 0230 11/17/18 0645  NA 133* 135 138 142 142 137  K 4.8 4.6 4.6 4.9 4.5 5.7*  CL 104 108 111 117* 118* 110  CO2 17* 17* 17* 17* 19* 16*  GLUCOSE 173* 135* 102* 76 107* 178*  BUN 99* 102* 98* 95* 92* 84*  CREATININE 3.15* 2.96* 2.70* 2.71* 2.73* 2.53*  CALCIUM 8.5* 8.3* 8.5* 8.6* 8.4* 8.8*  MG 2.5* 2.5* 2.5* 2.7* 2.9*  --   PHOS 4.5 4.1 4.2 3.9 4.1 4.2   GFR: Estimated Creatinine Clearance: 23.1 mL/min (A) (by C-G formula based on SCr of 2.53 mg/dL (H)). Liver Function Tests: Recent Labs  Lab 11/11/18 0323 11/12/18 0700 11/13/18 0223 11/14/18 0245 11/15/18 0231 11/16/18 0230 11/17/18 0645  AST 52* 32 28 27 26   --   --   ALT 47* 34 26 25 22   --   --   ALKPHOS 78 85 70 75 68  --   --   BILITOT 0.7 0.8 0.8 0.6 0.7  --   --   PROT 5.5* 6.1* 5.5* 6.1* 5.9*  --   --   ALBUMIN 2.0* 2.2* 2.0* 2.0* 1.9* 1.7* 2.0*   No results for input(s): LIPASE, AMYLASE in the last 168 hours. Recent Labs  Lab 11/12/18 1015  AMMONIA 18   Coagulation Profile: No results for input(s): INR, PROTIME in the last 168 hours. Cardiac Enzymes: No results for input(s): CKTOTAL, CKMB, CKMBINDEX, TROPONINI in the last 168 hours. BNP (last 3 results) No results for input(s): PROBNP  in the last 8760 hours. HbA1C: No results for input(s): HGBA1C in the last 72 hours. CBG: Recent Labs  Lab 11/16/18 1726 11/16/18 2259 11/17/18 0431 11/17/18 0829 11/17/18 1139  GLUCAP 179* 190* 147* 166* 182*   Lipid Profile: No results for input(s): CHOL, HDL, LDLCALC, TRIG, CHOLHDL, LDLDIRECT in the last 72 hours. Thyroid Function Tests: No results for input(s): TSH, T4TOTAL, FREET4, T3FREE, THYROIDAB in the last 72 hours. Anemia Panel: No results for input(s): VITAMINB12, FOLATE, FERRITIN, TIBC, IRON, RETICCTPCT in the last 72 hours. Sepsis Labs: Recent Labs  Lab 11/12/18 1015 11/13/18 0223 11/14/18 0245  PROCALCITON 6.13 6.01 4.82    Recent Results (from the past 240 hour(s))  Blood Culture (routine x 2)     Status: None   Collection Time: 11/08/18  3:33 PM  Result Value Ref Range Status   Specimen Description   Final    BLOOD RIGHT ANTECUBITAL Performed at [facility], [place] [address], [place], [place] [address]    Special Requests   Final    BOTTLES DRAWN AEROBIC AND ANAEROBIC Blood Culture results may not be optimal due to an excessive volume of blood received in culture bottles Performed at [facility] [address], [place], [place] [address]    Culture   Final    NO GROWTH 5 DAYS Performed at [facility], [place] [address], [place], [place] [address]    Report Status 11/13/2018 FINAL  Final  Blood Culture (routine x 2)     Status: None   Collection Time: 11/08/18  3:41 PM  Result Value Ref Range Status   Specimen Description   Final    BLOOD LEFT ANTECUBITAL Performed at [facility] [address], [place], [place] [address]    Special Requests   Final    BOTTLES DRAWN AEROBIC AND ANAEROBIC Blood Culture results may not be optimal due to  an excessive volume of blood received in culture bottles Performed at [facility] [address], [place], [place] [address]     Culture   Final    NO GROWTH 5 DAYS Performed at [facility], [place] [address], [place], [place] [address]    Report Status 11/13/2018 FINAL  Final  SARS Coronavirus 2 (CEPHEID- Performed in [facility] hospital lab), Hosp Order     Status: None   Collection Time: 11/08/18  4:07 PM  Result Value Ref Range Status   SARS Coronavirus 2 NEGATIVE NEGATIVE Final    Comment: (NOTE) If result is NEGATIVE SARS-CoV-2 target nucleic acids are NOT DETECTED. The SARS-CoV-2 RNA is generally detectable in upper and lower  respiratory specimens during the acute phase of infection. The lowest  concentration of SARS-CoV-2 viral copies this assay can detect is 250  copies / mL. A negative result does not preclude SARS-CoV-2 infection  and should not be used as the sole basis for treatment or other  patient management decisions.  A negative result may occur with  improper specimen collection / handling, submission of specimen other  than nasopharyngeal swab, presence of viral mutation(s) within the  areas targeted by this assay, and inadequate number of viral copies  (<250 copies / mL). A negative result must be combined with clinical  observations, patient history, and epidemiological information. If result is POSITIVE SARS-CoV-2 target nucleic acids are DETECTED. The SARS-CoV-2 RNA is generally detectable in upper and lower  respiratory specimens dur ing the acute phase of infection.  Positive  results are indicative of active infection with SARS-CoV-2.  Clinical  correlation with patient history and other diagnostic information is  necessary to determine patient infection status.  Positive results do  not rule out bacterial infection or co-infection with other viruses. If result is PRESUMPTIVE POSTIVE SARS-CoV-2 nucleic acids MAY BE PRESENT.   A presumptive positive result was obtained on the submitted specimen  and confirmed on repeat testing.  While 2019 novel coronavirus  (SARS-CoV-2)  nucleic acids may be present in the submitted sample  additional confirmatory testing may be necessary for epidemiological  and / or clinical management purposes  to differentiate between  SARS-CoV-2 and other Sarbecovirus currently known to infect humans.  If clinically indicated additional testing with an alternate test  methodology [phone]) is advised. The SARS-CoV-2 RNA is generally  detectable in upper and lower respiratory sp ecimens during the acute  phase of infection. The expected result is Negative. Fact Sheet for Patients:  StrictlyIdeas.no Fact Sheet for Healthcare Providers: BankingDealers.co.za This test is not yet approved or cleared by the [place] FDA and has been authorized for detection and/or diagnosis of SARS-CoV-2 by FDA under an Emergency Use Authorization (EUA).  This EUA will remain in effect (meaning this test can be used) for the duration of the COVID-19 declaration under Section 564(b)(1) of the Act, 21 U.S.C. section 360bbb-3(b)(1), unless the authorization is terminated or revoked sooner. Performed at [facility], [place] [address], [place], [place] [address]   MRSA PCR Screening     Status: None   Collection Time: 11/08/18  8:54 PM  Result Value Ref Range Status   MRSA by PCR NEGATIVE NEGATIVE Final    Comment:        The GeneXpert MRSA Assay (FDA approved for NASAL specimens only), is one component of a comprehensive MRSA colonization surveillance program. It is not intended to diagnose MRSA infection nor to guide or  monitor treatment for MRSA infections. Performed at [facility], [place] [address] [place], [place] [address]   Aerobic/Anaerobic Culture (surgical/deep wound)     Status: None   Collection Time: 11/09/18  2:06 PM  Result Value Ref Range Status   Specimen Description   Final    ABSCESS LEFT BUTTOCKS Performed at [facility] [address], [place], [place] [address]    Special Requests   Final    NONE Performed at [facility], [place] [address], [place], [place] [address]    Gram Stain   Final    ABUNDANT WBC PRESENT,BOTH PMN AND MONONUCLEAR ABUNDANT GRAM POSITIVE COCCI ABUNDANT GRAM NEGATIVE RODS    Culture   Final    FEW ESCHERICHIA COLI FEW BACTEROIDES SPECIES BETA LACTAMASE POSITIVE FEW ACTINOMYCES SPECIES Standardized susceptibility testing for this organism is not available. Performed at [facility], [place] [address] [place], [place] [address]    Report Status 11/15/2018 FINAL  Final   Organism ID, Bacteria ESCHERICHIA COLI  Final      Susceptibility   Escherichia coli - MIC*    AMPICILLIN <=2 SENSITIVE Sensitive     CEFAZOLIN <=4 SENSITIVE Sensitive     CEFEPIME <=1 SENSITIVE Sensitive     CEFTAZIDIME <=1 SENSITIVE Sensitive     CEFTRIAXONE <=1 SENSITIVE Sensitive     CIPROFLOXACIN <=0.25 SENSITIVE Sensitive     GENTAMICIN <=1 SENSITIVE Sensitive     IMIPENEM <=0.25 SENSITIVE Sensitive     TRIMETH/SULFA <=20 SENSITIVE Sensitive     AMPICILLIN/SULBACTAM <=2 SENSITIVE Sensitive     PIP/TAZO <=4 SENSITIVE Sensitive     Extended ESBL NEGATIVE Sensitive     * FEW ESCHERICHIA COLI  Urine Culture     Status: None   Collection Time: 11/10/18 12:30 PM  Result Value Ref Range Status   Specimen Description   Final    URINE, RANDOM Performed at [facility] [address] [place], [place] [address]    Special Requests   Final    NONE Performed at [facility], [place] [address] [place], [place] [address]    Culture   Final    NO GROWTH Performed at [facility], [place] [address], [place], [place] [address]    Report Status 11/11/2018 FINAL  Final         Radiology Studies: No results found.      Scheduled Meds:  allopurinol  300 mg Oral Daily   amLODipine  5 mg Oral Daily   aspirin EC  81 mg Oral Daily    bisacodyl  10 mg Rectal Daily   calcitRIOL  0.25 mcg Oral Daily   Chlorhexidine Gluconate Cloth  6 each Topical Daily   cloNIDine  0.1 mg Oral BID   insulin aspart  0-15 Units Subcutaneous Q4H   insulin glargine  15 Units Subcutaneous Daily   levothyroxine  50 mcg Oral Q0600   mouth rinse  15 mL Mouth Rinse BID   metoprolol succinate  50 mg Oral Daily   pantoprazole  40 mg Oral Q0600   patiromer  8.4 g Oral Daily   polyethylene glycol  17 g Oral BID   pravastatin  80 mg Oral QHS   sodium bicarbonate  1,300 mg Oral TID   Continuous Infusions:  ampicillin-sulbactam (UNASYN) IV 3 g (11/17/18 1035)     LOS: 9 days    Time spent: 40 minutes    Quillian Quince  Grandville Silos, MD [facility]  If 7PM-7AM, please contact night-coverage [email or web address] 11/17/2018, 12:20 PM

## 2018-11-17 NOTE — Progress Notes (Signed)
[facility] Progress Note  8 Days Post-Op  Subjective: CC: wound Seen with Dr. Kae Heller and hydrotherapy. Patient had large volume diarrhea before hydro started.   Objective: Vital signs in last 24 hours: Temp:  [97.8 F (36.6 C)-98.6 F (37 C)] 98.6 F (37 C) (05/15 0444) Pulse Rate:  [80-96] 80 (05/15 0444) Resp:  [17-20] 20 (05/15 0444) BP: (128-155)/(67-85) 137/74 (05/15 0444) SpO2:  [97 %-100 %] 98 % (05/15 0444) Last BM Date: (UTA)  Intake/Output from previous day: 05/14 0701 - 05/15 0700 In: 644.8 [P.O.:420; IV Piggyback:224.8] Out: 1400 [Urine:1400] Intake/Output this shift: No intake/output data recorded.  PE: Gen:  Alert, NAD Pulm:  Normal effort GU: wound as below with some fibrinous possibly necrotic areas at wound edges superomedially and inferolaterally but overall clean and minimal drainage noted      Lab Results:  Recent Labs    11/16/18 0230 11/16/18 1412 11/17/18 0645  WBC 19.3*  --  22.1*  HGB 7.7* 8.4* 8.9*  HCT 25.0* 26.2* 27.8*  PLT 336  --  391   BMET Recent Labs    11/16/18 0230 11/17/18 0645  NA 142 137  K 4.5 5.7*  CL 118* 110  CO2 19* 16*  GLUCOSE 107* 178*  BUN 92* 84*  CREATININE 2.73* 2.53*  CALCIUM 8.4* 8.8*   PT/INR No results for input(s): LABPROT, INR in the last 72 hours. CMP     Component Value Date/Time   NA 137 11/17/2018 0645   K 5.7 (H) 11/17/2018 0645   CL 110 11/17/2018 0645   CO2 16 (L) 11/17/2018 0645   GLUCOSE 178 (H) 11/17/2018 0645   BUN 84 (H) 11/17/2018 0645   CREATININE 2.53 (H) 11/17/2018 0645   CREATININE 3.95 (H) 07/12/2017 0921   CALCIUM 8.8 (L) 11/17/2018 0645   PROT 5.9 (L) 11/15/2018 0231   ALBUMIN 2.0 (L) 11/17/2018 0645   AST 26 11/15/2018 0231   ALT 22 11/15/2018 0231   ALKPHOS 68 11/15/2018 0231   BILITOT 0.7 11/15/2018 0231   GFRNONAA 18 (L) 11/17/2018 0645   GFRNONAA 11 (L) 07/12/2017 0921   GFRAA 21 (L) 11/17/2018 0645   GFRAA 12 (L) 07/12/2017 0921   Lipase      Component Value Date/Time   LIPASE 21 06/24/2014 0816       Studies/Results: No results found.  Anti-infectives: Anti-infectives (From admission, onward)   Start     Dose/Rate Route Frequency Ordered Stop   11/14/18 2200  Ampicillin-Sulbactam (UNASYN) 3 g in sodium chloride 0.9 % 100 mL IVPB     3 g 200 mL/hr over 30 Minutes Intravenous Every 12 hours 11/14/18 1219     11/13/18 2200  metroNIDAZOLE (FLAGYL) tablet 500 mg  Status:  Discontinued     500 mg Oral Every 8 hours 11/13/18 1356 11/14/18 1218   11/10/18 2200  vancomycin (VANCOCIN) IVPB 750 mg/150 ml premix  Status:  Discontinued     750 mg 150 mL/hr over 60 Minutes Intravenous Every 48 hours 11/08/18 2126 11/11/18 1451   11/09/18 1000  ceFEPIme (MAXIPIME) 2 g in sodium chloride 0.9 % 100 mL IVPB  Status:  Discontinued     2 g 200 mL/hr over 30 Minutes Intravenous Every 24 hours 11/09/18 0901 11/14/18 1218   11/08/18 2200  ceFEPIme (MAXIPIME) 2 g in sodium chloride 0.9 % 100 mL IVPB  Status:  Discontinued     2 g 200 mL/hr over 30 Minutes Intravenous Every 24 hours 11/08/18 2126 11/09/18 0901  11/08/18 2030  metroNIDAZOLE (FLAGYL) IVPB 500 mg  Status:  Discontinued     500 mg 100 mL/hr over 60 Minutes Intravenous Every 8 hours 11/08/18 2022 11/13/18 1356   11/08/18 1645  vancomycin (VANCOCIN) 2,500 mg in sodium chloride 0.9 % 500 mL IVPB     2,500 mg 250 mL/hr over 120 Minutes Intravenous  Once 11/08/18 1616 11/08/18 1941   11/08/18 1615  vancomycin (VANCOCIN) IVPB 1000 mg/200 mL premix  Status:  Discontinued     1,000 mg 200 mL/hr over 60 Minutes Intravenous  Once 11/08/18 1600 11/08/18 1616   11/08/18 1615  cefTRIAXone (ROCEPHIN) 2 g in sodium chloride 0.9 % 100 mL IVPB     2 g 200 mL/hr over 30 Minutes Intravenous  Once 11/08/18 1600 11/08/18 1712       Assessment/Plan DKA Type 2 diabetes Chronic on acute;stage IVkidney disease Hypertension Elevated troponin/Atrial fibrillation Hx of palpitations  GERD Hx of seizures 06/2015 Morbid obesity BMI 43.5  Sepsis secondary to below Left buttocks/perianal abscess - Incision and drainage of left buttocks abscess 11/09/2018, DR. Coralie Keens - Continue local wound care, decrease freq. Of hydrotherapy to M/W/F - cx grew pan-sensitive E.coli - wound cleaning up well, ok to stop abx from our standpoint Leukocytosis - WBC still elevated at 22.1, afebrile, no hypoTN or tachycardia Diarrhea - hold laxatives and insert rectal tube  FEN:HH/CM diet ID: Rocephin/vanc 5/6;Maxipime/flagyl 5/7 - 5/12; Unasyn 5/12>>  DVT: SCDs/can be on chemical DVT prophylaxis from our standpoint Follow-up: TBD POC: Carlena Sax -[phone]  Plan: Hydrotherapy M/W/F, continue BID dressing changes. Rectal tube for diarrhea acutely. We will see again Monday, otherwise call as needed.   LOS: 9 days    Brigid Re , [facility] 11/17/2018, 10:30 AM Pager: [phone] Consults: [phone]

## 2018-11-17 NOTE — Evaluation (Signed)
Occupational Therapy Evaluation Patient Details Name: Emily Hughes MRN: [id number] DOB: [birth year] Today's Date: 11/17/2018    History of Present Illness 78 year old female admitted for wound infection and hyperglycemia. PMH:  DM, CKD, A Fib, CAD, HTN and seizure disorder   Clinical Impression   Pt was admitted for the above. She lived alone prior to admission. Pt needs max +2 assist for bed mobility and standing not attempted to due R foot pain.  Pt reports gout, and she did not tolerate even light touch.  She needs total A for LB adls and hygiene from a bed level.  Bil hands have edema, but she is able to use R functionally to feed herself without adaptation.  Issued level one theraband for RUE and instructed on AROM for LUE. Will follow in acute setting with the goals listed below. She will need SNF after acute stay    Follow Up Recommendations  SNF    Equipment Recommendations  None recommended by OT(cannot transfer for 3:1 yet)    Recommendations for Other Services       Precautions / Restrictions Precautions Precautions: Fall Precaution Comments: gout pain in R foot Restrictions Weight Bearing Restrictions: No      Mobility Bed Mobility Overal bed mobility: Needs Assistance   Rolling: Max assist;+2 for physical assistance   Supine to sit: Max assist;+2 for physical assistance   Sit to sidelying: Max assist;+2 for physical assistance General bed mobility comments: assist for trunk and legs  Transfers                 General transfer comment: not attempted    Balance                                           ADL either performed or assessed with clinical judgement   ADL Overall ADL's : Needs assistance/impaired Eating/Feeding: Set up   Grooming: Set up   Upper Body Bathing: Minimal assistance   Lower Body Bathing: Total assistance;Bed level   Upper Body Dressing : Minimal assistance   Lower Body Dressing: Total assistance;+2  for physical assistance                 General ADL Comments: sat eob for assessment; too much pain to stand. Rolled to adjust pads under her     Vision         Perception     Praxis      Pertinent Vitals/Pain Pain Assessment: Faces Faces Pain Scale: Hurts worst Pain Location: R foot with movement; L as well, but not as severe Pain Descriptors / Indicators: Aching;Tingling Pain Intervention(s): Limited activity within patient's tolerance;Monitored during session;Repositioned     Hand Dominance Right   Extremity/Trunk Assessment Upper Extremity Assessment Upper Extremity Assessment: Generalized weakness(RUE 3+/5; LUE 3/5. bil hands edema; limited fist L)           Communication Communication Communication: No difficulties   Cognition Arousal/Alertness: Awake/alert Behavior During Therapy: WFL for tasks assessed/performed Overall Cognitive Status: Within Functional Limits for tasks assessed                                     General Comments  posted positioning on white board. Issued level one theraband for RUE FF and elbow flexion    Exercises  Shoulder Instructions      Home Living Family/patient expects to be discharged to:: Skilled nursing facility                                 Additional Comments: lived alone at baseline      Prior Functioning/Environment Level of Independence: Independent with assistive device(s)        Comments: uses walker        OT Problem List: Decreased strength;Decreased activity tolerance;Decreased knowledge of use of DME or AE;Increased edema;Pain;Impaired sensation(standing balance NT)      OT Treatment/Interventions: Self-care/ADL training;Therapeutic exercise;Energy conservation;DME and/or AE instruction;Therapeutic activities;Patient/family education    OT Goals(Current goals can be found in the care plan section) Acute Rehab OT Goals Patient Stated Goal: none stated;  agreeable to snf OT Goal Formulation: With patient Time For Goal Achievement: 12/01/18 Potential to Achieve Goals: Fair ADL Goals Pt Will Transfer to Toilet: with mod assist;with +2 assist;stand pivot transfer;bedside commode Pt Will Perform Toileting - Clothing Manipulation and hygiene: with max assist;with 2+ total assist;sit to/from stand Additional ADL Goal #1: pt will perform grooming and UB adls with set up from eob Additional ADL Goal #2: pt will go from sit to stand with mod +2 and maintain with min guard for 2 minutes for adls Additional ADL Goal #3: pt will be independent with written HEP, level one RUE and AROM LUE  OT Frequency: Min 2X/week   Barriers to D/C:            Co-evaluation PT/OT/SLP Co-Evaluation/Treatment: Yes Reason for Co-Treatment: For patient/therapist safety PT goals addressed during session: Mobility/safety with mobility OT goals addressed during session: Strengthening/ROM;ADL's and self-care      AM-PAC OT "6 Clicks" Daily Activity     Outcome Measure Help from another person eating meals?: A Little Help from another person taking care of personal grooming?: A Little Help from another person toileting, which includes using toliet, bedpan, or urinal?: Total Help from another person bathing (including washing, rinsing, drying)?: A Lot Help from another person to put on and taking off regular upper body clothing?: A Little Help from another person to put on and taking off regular lower body clothing?: Total 6 Click Score: 13   End of Session    Activity Tolerance: Patient limited by pain Patient left: in bed;with call bell/phone within reach;with bed alarm set  OT Visit Diagnosis: Muscle weakness (generalized) (M62.81);Pain Pain - Right/Left: Right Pain - part of body: Ankle and joints of foot                Time: 2263-3354 OT Time Calculation (min): 17 min Charges:  OT General Charges $OT Visit: 1 Visit OT Evaluation $OT Eval Low  Complexity: [place], OTR/L Acute Rehabilitation Services [phone] [facility] pager [phone] office 11/17/2018  [place] 11/17/2018, 2:51 PM

## 2018-11-18 DIAGNOSIS — R338 Other retention of urine: Secondary | ICD-10-CM | POA: Clinically undetermined

## 2018-11-18 LAB — BASIC METABOLIC PANEL
Anion gap: 8 (ref 5–15)
BUN: 80 mg/dL — ABNORMAL HIGH (ref 8–23)
CO2: 19 mmol/L — ABNORMAL LOW (ref 22–32)
Calcium: 8.8 mg/dL — ABNORMAL LOW (ref 8.9–10.3)
Chloride: 108 mmol/L (ref 98–111)
Creatinine, Ser: 2.46 mg/dL — ABNORMAL HIGH (ref 0.44–1.00)
GFR calc Af Amer: 21 mL/min — ABNORMAL LOW (ref >60)
GFR calc non Af Amer: 18 mL/min — ABNORMAL LOW (ref >60)
Glucose, Bld: 160 mg/dL — ABNORMAL HIGH (ref 70–99)
Potassium: 4.5 mmol/L (ref 3.5–5.1)
Sodium: 135 mmol/L (ref 135–145)

## 2018-11-18 LAB — CBC WITH DIFFERENTIAL/PLATELET
Abs Immature Granulocytes: 0.51 10*3/uL — ABNORMAL HIGH (ref 0.00–0.07)
Basophils Absolute: 0.1 10*3/uL (ref 0.0–0.1)
Basophils Relative: 0 %
Eosinophils Absolute: 0.2 10*3/uL (ref 0.0–0.5)
Eosinophils Relative: 1 %
HCT: 28.4 % — ABNORMAL LOW (ref 36.0–46.0)
Hemoglobin: 8.7 g/dL — ABNORMAL LOW (ref 12.0–15.0)
Immature Granulocytes: 3 %
Lymphocytes Relative: 13 %
Lymphs Abs: 2.3 10*3/uL (ref 0.7–4.0)
MCH: 26.4 pg (ref 26.0–34.0)
MCHC: 30.6 g/dL (ref 30.0–36.0)
MCV: 86.3 fL (ref 80.0–100.0)
Monocytes Absolute: 1.1 10*3/uL — ABNORMAL HIGH (ref 0.1–1.0)
Monocytes Relative: 6 %
Neutro Abs: 12.9 10*3/uL — ABNORMAL HIGH (ref 1.7–7.7)
Neutrophils Relative %: 77 %
Platelets: 416 10*3/uL — ABNORMAL HIGH (ref 150–400)
RBC: 3.29 MIL/uL — ABNORMAL LOW (ref 3.87–5.11)
RDW: 15.9 % — ABNORMAL HIGH (ref 11.5–15.5)
WBC: 17 10*3/uL — ABNORMAL HIGH (ref 4.0–10.5)
nRBC: 0.1 % (ref 0.0–0.2)

## 2018-11-18 LAB — GLUCOSE, CAPILLARY
Glucose-Capillary: 145 mg/dL — ABNORMAL HIGH (ref 70–99)
Glucose-Capillary: 164 mg/dL — ABNORMAL HIGH (ref 70–99)
Glucose-Capillary: 221 mg/dL — ABNORMAL HIGH (ref 70–99)
Glucose-Capillary: 225 mg/dL — ABNORMAL HIGH (ref 70–99)
Glucose-Capillary: 203 mg/dL — ABNORMAL HIGH (ref 70–99)

## 2018-11-18 MED ORDER — TAMSULOSIN HCL 0.4 MG PO CAPS
0.4000 mg | ORAL_CAPSULE | Freq: Every day | ORAL | Status: DC
  Administered 2018-11-18 – 2018-11-29 (×12): 0.4 mg via ORAL
  Filled 2018-11-18 (×13): qty 1

## 2018-11-18 MED ORDER — AMLODIPINE BESYLATE 5 MG PO TABS
2.5000 mg | ORAL_TABLET | Freq: Every day | ORAL | Status: DC
  Administered 2018-11-19 – 2018-11-22 (×4): 2.5 mg via ORAL
  Filled 2018-11-18 (×5): qty 1

## 2018-11-18 NOTE — NC FL2 (Signed)
[place] LEVEL OF CARE SCREENING TOOL     IDENTIFICATION  Patient Name: Emily Hughes Birthdate: [birth year] Sex: female Admission Date (Current Location): 11/08/2018  [facility] [place] Number:  Herbalist and Address:  [facility],  [place], [place]      Provider Number: [id number]  Attending Physician Name and Address:  Eugenie Filler, MD  Relative Name and Phone Number:       Current Level of Care: Hospital Recommended Level of Care: [place] Prior Approval Number:    Date Approved/Denied:   PASRR Number: [id number]  Discharge Plan: SNF    Current Diagnoses: Patient Active Problem List   Diagnosis Date Noted  . Acute urinary retention 11/18/2018  . Acute renal failure superimposed on stage 4 chronic kidney disease ([place]) 11/15/2018  . Acute metabolic encephalopathy 66/44/0347  . Cellulitis and abscess of buttock   . Abnormal LFTs   . Abscess of left buttock 11/08/2018  . Sepsis ([place]) 11/08/2018  . Atrial fibrillation with RVR ([place]) 11/08/2018  . Elevated troponin 11/08/2018  . Convulsions ([place]) 07/24/2015  . Type 2 diabetes mellitus with chronic kidney disease, without long-term current use of insulin ([place]) 07/24/2015  . Leukocytosis   . Chronic kidney disease (CKD), stage IV (severe) ([place]) 06/13/2015  . Seizures ([place]) 06/12/2015  . DKA (diabetic ketoacidoses) ([place]) 06/12/2015  . Seizure ([place]) 06/12/2015  . High cholesterol   . CAD (coronary artery disease)   . Proteinuria   . GERD (gastroesophageal reflux disease)   . DYSLIPIDEMIA 02/27/2010  . OBESITY, MORBID 02/27/2010  . DM 02/24/2010  . RHEUMATIC FEVER 02/24/2010  . Essential hypertension 02/24/2010  . RENAL INSUFFICIENCY, CHRONIC 02/24/2010    Orientation RESPIRATION BLADDER Height & Weight     Self, Time, Situation, Place  Normal Indwelling catheter Weight: 253 lb 8.5 oz (115 kg) Height:  5\' 3"  (160 cm)  BEHAVIORAL  SYMPTOMS/MOOD NEUROLOGICAL BOWEL NUTRITION STATUS      Continent(rectal tube) Diet(heart healthy)  AMBULATORY STATUS COMMUNICATION OF NEEDS Skin   Extensive Assist Verbally Hydro Therapy(open incision- wound buttocks, hydrotherapy m/w/f, dressing changes 6days/week)                       Personal Care Assistance Level of Assistance  Bathing, Feeding, Dressing Bathing Assistance: Maximum assistance Feeding assistance: Independent Dressing Assistance: Maximum assistance     Functional Limitations Info  Sight, Hearing, Speech Sight Info: Adequate Hearing Info: Adequate Speech Info: Adequate    SPECIAL CARE FACTORS FREQUENCY  PT (By licensed PT), OT (By licensed OT)     PT Frequency: 5x OT Frequency: 5x            Contractures Contractures Info: Not present    Additional Factors Info  Code Status, Allergies Code Status Info: full code Allergies Info: Ace Inhibitors, Actos Pioglitazone Hydrochloride, Dust Mite Extract, Dye Fdc Red Erythrosine Red No. 3           Current Medications (11/18/2018):  This is the current hospital active medication list Current Facility-Administered Medications  Medication Dose Route Frequency Provider Last Rate Last Dose  . acetaminophen (TYLENOL) tablet 650 mg  650 mg Oral Q6H PRN Eugenie Filler, MD       Or  . acetaminophen (TYLENOL) suppository 650 mg  650 mg Rectal Q6H PRN Eugenie Filler, MD      . allopurinol (ZYLOPRIM) tablet 300 mg  300 mg Oral Daily Irine Seal  V, MD   300 mg at 11/18/18 0932  . amLODipine (NORVASC) tablet 5 mg  5 mg Oral Daily Eugenie Filler, MD   5 mg at 11/18/18 0932  . Ampicillin-Sulbactam (UNASYN) 3 g in sodium chloride 0.9 % 100 mL IVPB  3 g Intravenous Q12H Eugenie Filler, MD 200 mL/hr at 11/18/18 0937 3 g at 11/18/18 0937  . aspirin EC tablet 81 mg  81 mg Oral Daily Eugenie Filler, MD   81 mg at 11/18/18 7048  . calcitRIOL (ROCALTROL) capsule 0.25 mcg  0.25 mcg Oral Daily Eugenie Filler, MD   0.25 mcg at 11/18/18 0933  . Chlorhexidine Gluconate Cloth 2 % PADS 6 each  6 each Topical Daily Eugenie Filler, MD   6 each at 11/18/18 1244  . cloNIDine (CATAPRES) tablet 0.1 mg  0.1 mg Oral BID Eugenie Filler, MD   0.1 mg at 11/18/18 0932  . fentaNYL (SUBLIMAZE) injection 12.5 mcg  12.5 mcg Intravenous Q4H PRN Eugenie Filler, MD   12.5 mcg at 11/16/18 1026  . insulin aspart (novoLOG) injection 0-15 Units  0-15 Units Subcutaneous Q4H Eugenie Filler, MD   5 Units at 11/18/18 1242  . insulin glargine (LANTUS) injection 15 Units  15 Units Subcutaneous Daily Eugenie Filler, MD   15 Units at 11/18/18 [phone]  . levothyroxine (SYNTHROID) tablet 50 mcg  50 mcg Oral Q0600 Eugenie Filler, MD   50 mcg at 11/18/18 0536  . MEDLINE mouth rinse  15 mL Mouth Rinse BID Eugenie Filler, MD   15 mL at 11/18/18 1108  . metoprolol succinate (TOPROL-XL) 24 hr tablet 50 mg  50 mg Oral Daily Eugenie Filler, MD   50 mg at 11/18/18 0933  . ondansetron (ZOFRAN) tablet 4 mg  4 mg Oral Q6H PRN Eugenie Filler, MD       Or  . ondansetron [facility]) injection 4 mg  4 mg Intravenous Q6H PRN Eugenie Filler, MD   4 mg at 11/15/18 0913  . oxyCODONE (Oxy IR/ROXICODONE) immediate release tablet 5 mg  5 mg Oral Q6H PRN Eugenie Filler, MD   5 mg at 11/18/18 0932  . pantoprazole (PROTONIX) EC tablet 40 mg  40 mg Oral Q0600 Eugenie Filler, MD   40 mg at 11/18/18 0536  . patiromer Daryll Drown) packet 8.4 g  8.4 g Oral Daily Eugenie Filler, MD   8.4 g at 11/18/18 1247  . pravastatin (PRAVACHOL) tablet 80 mg  80 mg Oral QHS Eugenie Filler, MD   80 mg at 11/17/18 2255  . sodium bicarbonate tablet 1,300 mg  1,300 mg Oral TID Eugenie Filler, MD   1,300 mg at 11/18/18 0933  . tamsulosin (FLOMAX) capsule 0.4 mg  0.4 mg Oral QPC breakfast Eugenie Filler, MD   0.4 mg at 11/18/18 1108     Discharge Medications: Please see discharge summary for a list of discharge  medications.  Relevant Imaging Results:  Relevant Lab Results:   Additional Information SS# [id number]  Nila Nephew, LCSW

## 2018-11-18 NOTE — Progress Notes (Addendum)
PROGRESS NOTE    Emily Hughes  UJW:[id number] DOB: [birth year] DOA: 11/08/2018 PCP: Susy Frizzle, MD   Brief Narrative:  HPI per Dr. Toy Baker on 11/08/2018  Patient is a 78 year old African-American female with a past medical history significant for but not limited to diabetes mellitus type 2, CKD stage IV, hypothyroidism, CAD, GERD, hypertension, hyperlipidemia, history of seizure disorder, ? Atrial fibrillation, long with comorbidities who presented to the emergency room with a 3-day history of pain and swelling in her buttocks along with a fever of 102.  Family reportedly gave her some Tylenol that helped her improve her temperature down to 99 but she is found to have elevated blood pressures for last few days when in the 400s.  She has not been taking her insulin for last 2 days and has not been checking her glucose regularly.  On admission she stated that the left buttock pain lasted for last few days and was severe 10 out of 10 and reportedly was a boil that then progressed.  She was worked up and admitted for sepsis secondary to abscess of the left buttock as well as DKA.  She was placed in the stepdown unit and was placed on glucose stabilizer and has improvement in her blood sugars.  She was incidentally noted to be in atrial fibrillation/A Flutter which then converted to NSR a few days ago.  Cardiology was consulted for preoperative clearance and patient was taken for incision and drainage of her abscess on her left buttock and she is POD2. DKA improved and she was transitioned to Long Acting Insulin even though Her CO2 is not >20 given that she has chronic Metabolic Acidosis 2/2 to her Renal Disease and is on Sodium Bicarbonate. She tolerated her Diet without issues but complained of some Abdominal Pain.   Liver function tests were elevated and right upper quadrant ultrasound was done and showed possible acute cholecystitis.  I discussed with general surgery Dr. Jens Som  who recommended obtaining a HIDA scan however HIDA scan with EF is not able to be done at this time and if EF is needed to be done will come will be on Tuesday per nursing. So will obtain a HIDA scan without the EF currently.  And this HIDA scan was normal hepatobiliary scan so Dr. Windle Guard was less inclined for acute cholecystitis and felt that her LFTs being elevated could be reactionary.  We will continue to monitor closely and antibiotics have been de-escalated to just IV cefepime Flagyl for now and will continue to de-escalate accordingly.  Echocardiogram was done yesterday.  On 11/12/2018 the patient was lethargic and slower to respond.  Obtained an ABG which showed some mild hypoxemia and ammonia level was 18.  Head CT was done and showed no acute intracranial findings and mild atrophy and white matter microvascular disease.  WBC is slowly trending up so repeat cultures were ordered including a chest x-ray, blood cultures, urinalysis.  Chest x-ray showed minimal bibasilar segment atelectasis.  Patient was complaining of left foot pain so foot x-ray was also obtained findings.  Patient's somnolence does not improve will obtain a neurologic consult in the a.m.  Repeat urinalysis showed cloudy urine and small hemoglobin along with moderate leukocytes, few bacteria and 11-20 BCs.  She was on IV antibiotics.   11/13/2018 remained somnolent (received 10 mg of po Hydrocodone this AM) so narcotics have been adjusted and IV morphine has been discontinued along with p.o. hydrocodone.  We will continue to monitor and  white blood cell count is now trending down but in general surgery is recommending hydrotherapy but no further surgical intervention.  Today 11/14/2018 was still a little lethargic but improving.  Patient started hydrotherapy and antibiotics have been further adjusted from IV cefepime to IV Unasyn.  White blood cell count remains somewhat elevated but could be secondary to pain.  We will continue  monitor patient mental status given her changes in her opiate regimen and may need to adjust further.     Assessment & Plan:   Principal Problem:   Sepsis ([place]) Active Problems:   Abscess of left buttock   DKA (diabetic ketoacidoses) (HCC)   Acute renal failure superimposed on stage 4 chronic kidney disease (HCC)   Essential hypertension   CAD (coronary artery disease)   GERD (gastroesophageal reflux disease)   Chronic kidney disease (CKD), stage IV (severe) (HCC)   Atrial fibrillation with RVR (HCC)   Elevated troponin   Abnormal LFTs   Acute metabolic encephalopathy   Cellulitis and abscess of buttock   Acute urinary retention  #1 sepsis secondary to left buttocks/gluteal abscess status post incision and drainage of abscess POD #8. Patient was admitted meeting sepsis criteria with fever, leukocytosis, tachycardia(new onset atrial fibrillation).  Sepsis protocol was initiated in the ED.  Sepsis physiology improving as patient currently afebrile.  Tachycardia improving.  Patient still with a leukocytosis which is fluctuating and currently at 22.6 from 24.6.  Blood cultures with no growth to date x5 days.  Urine culture is negative.  MRSA PCR negative.  COVID-19 negative.  Cultures from left buttocks abscess positive for E. coli which is pansensitive.  Patient was seen in consultation by general surgery and underwent incision and drainage of abscess with cultures sent.  Procalcitonin level was 6.89 and trended down to 4.82.  Lactic acid level trended down from 2.9-1.0.  Patient initially placed on IV vancomycin, IV cefepime and IV metronidazole.  Antibiotics narrowed down patient currently on IV Unasyn.  Patient noted to have ongoing leukocytosis repeat urinalysis showed moderate leukocytes, nitrite negative, few bacteria, 11-20 WBCs.  Repeat chest x-ray done 11/12/2018- for any acute infiltrate however consistent with atelectasis.  Patient currently receiving hydrotherapy which was started on  11/14/2018.  Patient also on twice daily wet-to-dry dressing changes per general surgery.  Continue empiric IV Unasyn and could likely discontinue Unasyn in the next 1 to 2 days.  Per general surgery.Follow.  2.  Diabetes mellitus type 2/DKA Patient had presented in DKA felt secondary to problem #1.  Hemoglobin A1c 8.4.  On admission patient noted to have a beta hydroxybutyric acid of 2.29.  Patient was placed on the glucose stabilizer and subsequently transition to subcutaneous Lantus at 15 units daily as well as sliding scale insulin and meal coverage NovoLog of 6 units 3 times daily with meals.  Patient with improving oral intake.   CBG of 164 this morning.  Continue Lantus 15 units daily and may need to decrease pending blood glucose levels.  Discontinued meal coverage NovoLog.  Continue sliding scale insulin.  Follow.  3.  New onset/Transient atrial fibrillation/atrial flutter with variable block status post conversion to normal sinus rhythm with transient third-degree AV block. Felt likely in the setting of sepsis secondary to problem #1.  Patient converted to normal sinus rhythm with a short episode of third-degree AV block overnight while asleep thought possibly secondary to AV nodal blocking drugs and sleep apnea.  Patient initially on a Cardizem drip and subsequently transitioned to oral  Cardizem which has subsequently been discontinued.  Patient seen in consultation by cardiology who feels no need for anticoagulation at this time given short duration and need for surgical intervention.  Continue current regimen of Toprol-XL 50 mg daily.  Appreciate cardiology input and recommendations.  4.  Nausea Patient with complaints of nausea yesterday morning which has since resolved. Patient also with some complaints of constipation not sure the last time she had bowel movement.  Continue daily Dulcolax suppositories.  Patient given soapsuds enema x1 yesterday however no bowel movement.  Patient noted to  have multiple loose stools on 11/17/2018.  Dulcolax suppositories and MiraLAX have been discontinued.  Nausea improved.  Follow.  5.  Hypertension Blood pressure improved.  Continue Toprol-XL, clonidine, Norvasc.  Follow.    6.  Elevated troponin Felt secondary to demand ischemia in the setting of sepsis, elevated lactic acid and acute on chronic kidney failure.  EKG with question of flutter versus sinus tach with PACs.  Repeat EKG showed a normal sinus rhythm and evidence of LVH with no signs of ischemia noted.  Patient seen by cardiology who felt no further ischemic work-up needed at this time.  Continue aspirin 81 mg daily, Toprol-XL, statin.  Follow.  7.  Acute on chronic kidney disease stage IV/chronic metabolic acidosis Felt secondary to prerenal azotemia in the setting of sepsis.  Patient on admission noted to have a creatinine of 4.76.  Patient placed on IV fluids which have subsequently been discontinued.  Renal function improved and currently close to baseline.  Creatinine at 2.46.  Continue calcitriol.  Acidosis improved with increased bicarb tablets of 1300 mg p.o. 3 times daily.  Outpatient follow-up with nephrology.   8.  Hypothyroidism TSH of 1.815 on 11/09/2018.  Continue Synthroid.  Outpatient follow-up.  9.  Coronary artery disease Currently stable.  Patient denies any acute chest pain.  Patient was seen by cardiology during the hospitalization secondary to new onset atrial flutter with variable block.  Continue current cardiac medications of aspirin, Toprol-XL, statin, Norvasc.  Outpatient follow-up with cardiology.  10.  Hyponatremia Improved.  11.  Hypermagnesemia Monitor.  Magnesium level at 2.9.  Follow.  12.  Acute encephalopathy/somnolence and lethargy Questionable etiology.  Likely medication induced.  Patient noted to be somnolent and lethargic however improving daily, patient answering questions.  Patient following commands.  Getting close to baseline.  Head CT done  was negative for any acute abnormalities.  ABG with a pH of 7.383/PCO2 of 27/PO2 of 77.  Chest x-ray negative, blood cultures negative to date, urine cultures negative.  Patient being treated for left gluteal abscess on empiric IV antibiotics.  Continue to limit sedating medications.  IV morphine has been discontinued.  Patient now on oxycodone.  Patient seems to be slowly improving following commands appropriately and answering some questions.  EEG done consistent with generalized nonspecific cerebral dysfunction/encephalopathy.  No seizures or seizure predisposition recorded on the study.  Monitor for now.  13.  Morbid obesity Weight loss and dietary counseling given.  14.  History of gout Colchicine on hold.  Continue allopurinol.  15.  Abnormal LFTs Likely reactive.  LFTs trended down.  Acute hepatitis panel was negative.  Right upper quadrant ultrasound showed gallbladder sludge and positive sonographic Murphy sign however no biliary duct dilatation.  HIDA scan which was done was normal.  Dr. Alfredia Ferguson discussed the case with general surgery who recommended no further intervention at this time and to continue to trend hepatic function.  16.  Normocytic  anemia/anemia of chronic kidney disease Likely dilutional and postoperative drop.  Hemoglobin was 11.4 and dropped to 7.7 and now at 8.7.  Patient with no overt bleeding. Anemia panel consistent with anemia of chronic disease.  Follow H&H.  Transfusion threshold hemoglobin less than 7.  17.  Leukocytosis Likely secondary to problem #1.  WBC was fluctuating.  Patient afebrile.  Patient has been pancultured with blood cultures with no growth to date, urine culture is negative, chest x-ray with no acute infiltrate. COVID-19 negative. Patient with no respiratory symptoms.  Patient with left gluteal abscess that has been I&D and cultures positive for E. coli.  Patient currently on IV Unasyn.  If continued improvement with leukocytosis could likely  discontinue antibiotics tomorrow.  Follow.   18. gastroesophageal reflux disease Continue PPI.  19.  Loose stools Patient noted to have loose stools per RN.  Discontinued Dulcolax and MiraLAX.  Loose stools improving.  Follow.  20.  Hyperkalemia Improved with resumption of home regimen of Veltassa.  21.  urinary retention Foley catheter placed back in overnight due to urinary retention.  Start Flomax 0.4 mg daily.  Follow.    DVT prophylaxis: SCDs Code Status: Full Family Communication: Updated patient.  No family at bedside. Disposition Plan: Likely need SNF on discharge.    Consultants:   Cardiology: Dr. Meda Coffee 11/09/2018  General surgery: Dr. Marlou Starks III 11/08/2018  Procedures:   CT head 11/12/2018  CT pelvis 11/08/2018  Chest x-ray 11/08/2018, 11/12/2018, 11/13/2018, 11/15/2018  Plain films of the left foot 11/12/2018  Limited echocardiogram 11/11/2018  Right upper quadrant ultrasound 11/11/2018  Incision and drainage of left buttocks abscess 11/09/2018 per Dr. Ninfa Linden  EEG 11/15/2018  Antimicrobials:   Oral Flagyl 11/13/2018>>>> 11/14/2018  IV Unasyn 11/14/2018>>>>  IV cefepime 11/08/2018>>>> 11/14/2018  IV Flagyl 11/08/2018>>>>> 11/13/2018  IV vancomycin 11/08/2018>>>> 11/11/2018   Subjective: Patient sitting up in bed.  Denies any chest pain or shortness of breath.  States wound pain improving.  Diarrhea improving as noted via rectal tube.  Patient noted to have some urinary retention overnight and Foley catheter placed back in.   Objective: Vitals:   11/17/18 1625 11/17/18 2221 11/18/18 0448 11/18/18 0817  BP: 127/69 128/73 (!) 145/72   Pulse: 85 81 81   Resp: (!) 22 (!) 24 20   Temp: 97.9 F (36.6 C) 98.1 F (36.7 C) (!) 97.5 F (36.4 C)   TempSrc:  Oral Oral   SpO2: 97% 98% 100% 95%  Weight:      Height:        Intake/Output Summary (Last 24 hours) at 11/18/2018 1232 Last data filed at 11/17/2018 1600 Gross per 24 hour  Intake --  Output 750 ml  Net -750 ml    Filed Weights   11/10/18 0500 11/13/18 0400 11/17/18 0837  Weight: 110.3 kg 113 kg 115 kg    Examination:  General exam: NAD Respiratory system: Lungs clear to auscultation bilaterally.  No wheezes, no crackles, no rhonchi.  Cardiovascular system: Regular rate and rhythm no murmurs rubs or gallops.  No JVD.  No lower extremity edema. Gastrointestinal system: Abdomen is soft, nontender, nondistended, positive bowel sounds.  No rebound.  No guarding.  Central nervous system: Alert. No focal neurological deficits.  Moving extremities spontaneously. Extremities: Symmetric 5 x 5 power. Skin: Left gluteal wound with some fibrinous area with some necrotic edges with minimal drainage. Psychiatry: Judgement and insight appear fair. Mood & affect appropriate.     Data Reviewed: I have personally reviewed  following labs and imaging studies  CBC: Recent Labs  Lab 11/14/18 0245 11/15/18 0231 11/16/18 0230 11/16/18 1412 11/17/18 0645 11/18/18 0629  WBC 24.6* 22.6* 19.3*  --  22.1* 17.0*  NEUTROABS 18.2* 16.5* 14.4*  --  17.7* 12.9*  HGB 8.7* 8.7* 7.7* 8.4* 8.9* 8.7*  HCT 27.1* 26.6* 25.0* 26.2* 27.8* 28.4*  MCV 83.6 83.6 85.0  --  82.7 86.3  PLT 314 351 336  --  391 979*   Basic Metabolic Panel: Recent Labs  Lab 11/12/18 0700 11/13/18 0223 11/14/18 0245 11/15/18 0231 11/16/18 0230 11/17/18 0645 11/18/18 0629  NA 133* 135 138 142 142 137 135  K 4.8 4.6 4.6 4.9 4.5 5.7* 4.5  CL 104 108 111 117* 118* 110 108  CO2 17* 17* 17* 17* 19* 16* 19*  GLUCOSE 173* 135* 102* 76 107* 178* 160*  BUN 99* 102* 98* 95* 92* 84* 80*  CREATININE 3.15* 2.96* 2.70* 2.71* 2.73* 2.53* 2.46*  CALCIUM 8.5* 8.3* 8.5* 8.6* 8.4* 8.8* 8.8*  MG 2.5* 2.5* 2.5* 2.7* 2.9*  --   --   PHOS 4.5 4.1 4.2 3.9 4.1 4.2  --    GFR: Estimated Creatinine Clearance: 23.8 mL/min (A) (by C-G formula based on SCr of 2.46 mg/dL (H)). Liver Function Tests: Recent Labs  Lab 11/12/18 0700 11/13/18 0223 11/14/18 0245  11/15/18 0231 11/16/18 0230 11/17/18 0645  AST 32 28 27 26   --   --   ALT 34 26 25 22   --   --   ALKPHOS 85 70 75 68  --   --   BILITOT 0.8 0.8 0.6 0.7  --   --   PROT 6.1* 5.5* 6.1* 5.9*  --   --   ALBUMIN 2.2* 2.0* 2.0* 1.9* 1.7* 2.0*   No results for input(s): LIPASE, AMYLASE in the last 168 hours. Recent Labs  Lab 11/12/18 1015  AMMONIA 18   Coagulation Profile: No results for input(s): INR, PROTIME in the last 168 hours. Cardiac Enzymes: No results for input(s): CKTOTAL, CKMB, CKMBINDEX, TROPONINI in the last 168 hours. BNP (last 3 results) No results for input(s): PROBNP in the last 8760 hours. HbA1C: No results for input(s): HGBA1C in the last 72 hours. CBG: Recent Labs  Lab 11/17/18 1621 11/17/18 2218 11/18/18 0444 11/18/18 0754 11/18/18 1153  GLUCAP 256* 232* 145* 164* 221*   Lipid Profile: No results for input(s): CHOL, HDL, LDLCALC, TRIG, CHOLHDL, LDLDIRECT in the last 72 hours. Thyroid Function Tests: No results for input(s): TSH, T4TOTAL, FREET4, T3FREE, THYROIDAB in the last 72 hours. Anemia Panel: No results for input(s): VITAMINB12, FOLATE, FERRITIN, TIBC, IRON, RETICCTPCT in the last 72 hours. Sepsis Labs: Recent Labs  Lab 11/12/18 1015 11/13/18 0223 11/14/18 0245  PROCALCITON 6.13 6.01 4.82    Recent Results (from the past 240 hour(s))  Blood Culture (routine x 2)     Status: None   Collection Time: 11/08/18  3:33 PM  Result Value Ref Range Status   Specimen Description   Final    BLOOD RIGHT ANTECUBITAL Performed at [facility], [place] [address], [place], [place] [address]    Special Requests   Final    BOTTLES DRAWN AEROBIC AND ANAEROBIC Blood Culture results may not be optimal due to an excessive volume of blood received in culture bottles Performed at [place] [address], [place], [place] [address]    Culture   Final    NO GROWTH 5 DAYS Performed at [facility], [place] [address] [place], [place] [address]    Report Status 11/13/2018 FINAL  Final  Blood Culture (routine x 2)     Status: None   Collection Time: 11/08/18  3:41 PM  Result Value Ref Range Status   Specimen Description   Final    BLOOD LEFT ANTECUBITAL Performed at [place] [address], [place], [place] [address]    Special Requests   Final    BOTTLES DRAWN AEROBIC AND ANAEROBIC Blood Culture results may not be optimal due to an excessive volume of blood received in culture bottles Performed at [place] [address], [place], [place] [address]    Culture   Final    NO GROWTH 5 DAYS Performed at [facility], [place] [address], [place], [place] [address]    Report Status 11/13/2018 FINAL  Final  SARS Coronavirus 2 (CEPHEID- Performed in [place] [facility] lab), Hosp Order     Status: None   Collection Time: 11/08/18  4:07 PM  Result Value Ref Range Status   SARS Coronavirus 2 NEGATIVE NEGATIVE Final    Comment: (NOTE) If result is NEGATIVE SARS-CoV-2 target nucleic acids are NOT DETECTED. The SARS-CoV-2 RNA is generally detectable in upper and lower  respiratory specimens during the acute phase of infection. The lowest  concentration of SARS-CoV-2 viral copies this assay can detect is 250  copies / mL. A negative result does not preclude SARS-CoV-2 infection  and should not be used as the sole basis for treatment or other  patient management decisions.  A negative result may occur with  improper specimen collection / handling, submission of specimen other  than nasopharyngeal swab, presence of viral mutation(s) within the  areas targeted by this assay, and inadequate number of viral copies  (<250 copies / mL). A negative result must be combined with clinical  observations, patient history, and epidemiological information. If result is POSITIVE SARS-CoV-2 target nucleic acids are DETECTED. The SARS-CoV-2 RNA is generally detectable in  upper and lower  respiratory specimens dur ing the acute phase of infection.  Positive  results are indicative of active infection with SARS-CoV-2.  Clinical  correlation with patient history and other diagnostic information is  necessary to determine patient infection status.  Positive results do  not rule out bacterial infection or co-infection with other viruses. If result is PRESUMPTIVE POSTIVE SARS-CoV-2 nucleic acids MAY BE PRESENT.   A presumptive positive result was obtained on the submitted specimen  and confirmed on repeat testing.  While 2019 novel coronavirus  (SARS-CoV-2) nucleic acids may be present in the submitted sample  additional confirmatory testing may be necessary for epidemiological  and / or clinical management purposes  to differentiate between  SARS-CoV-2 and other Sarbecovirus currently known to infect humans.  If clinically indicated additional testing with an alternate test  methodology [phone]) is advised. The SARS-CoV-2 RNA is generally  detectable in upper and lower respiratory sp ecimens during the acute  phase of infection. The expected result is Negative. Fact Sheet for Patients:  StrictlyIdeas.no Fact Sheet for Healthcare Providers: BankingDealers.co.za This test is not yet approved or cleared by the [place] FDA and has been authorized for detection and/or diagnosis of SARS-CoV-2 by FDA under an Emergency Use Authorization (EUA).  This EUA will remain in effect (meaning this test can be used) for the duration of the COVID-19 declaration under Section 564(b)(1) of the Act, 21 U.S.C. section 360bbb-3(b)(1), unless  the authorization is terminated or revoked sooner. Performed at [facility], [place] [address] [place], [place] [address]   MRSA PCR Screening     Status: None   Collection Time: 11/08/18  8:54 PM  Result Value Ref Range Status   MRSA by PCR NEGATIVE NEGATIVE Final      Comment:        The GeneXpert MRSA Assay (FDA approved for NASAL specimens only), is one component of a comprehensive MRSA colonization surveillance program. It is not intended to diagnose MRSA infection nor to guide or monitor treatment for MRSA infections. Performed at [facility], [place] [address], [place], [place] [address]   Aerobic/Anaerobic Culture (surgical/deep wound)     Status: None   Collection Time: 11/09/18  2:06 PM  Result Value Ref Range Status   Specimen Description   Final    ABSCESS LEFT BUTTOCKS Performed at [place] [address], [place], [place] [address]    Special Requests   Final    NONE Performed at [facility], [place] [address], [place], [place] [address]    Gram Stain   Final    ABUNDANT WBC PRESENT,BOTH PMN AND MONONUCLEAR ABUNDANT GRAM POSITIVE COCCI ABUNDANT GRAM NEGATIVE RODS    Culture   Final    FEW ESCHERICHIA COLI FEW BACTEROIDES SPECIES BETA LACTAMASE POSITIVE FEW ACTINOMYCES SPECIES Standardized susceptibility testing for this organism is not available. Performed at [facility], [place] [address] [place], [place] [address]    Report Status 11/15/2018 FINAL  Final   Organism ID, Bacteria ESCHERICHIA COLI  Final      Susceptibility   Escherichia coli - MIC*    AMPICILLIN <=2 SENSITIVE Sensitive     CEFAZOLIN <=4 SENSITIVE Sensitive     CEFEPIME <=1 SENSITIVE Sensitive     CEFTAZIDIME <=1 SENSITIVE Sensitive     CEFTRIAXONE <=1 SENSITIVE Sensitive     CIPROFLOXACIN <=0.25 SENSITIVE Sensitive     GENTAMICIN <=1 SENSITIVE Sensitive     IMIPENEM <=0.25 SENSITIVE Sensitive     TRIMETH/SULFA <=20 SENSITIVE Sensitive     AMPICILLIN/SULBACTAM <=2 SENSITIVE Sensitive     PIP/TAZO <=4 SENSITIVE Sensitive     Extended ESBL NEGATIVE Sensitive     * FEW ESCHERICHIA COLI  Urine Culture     Status: None   Collection Time: 11/10/18 12:30 PM  Result Value Ref Range Status    Specimen Description   Final    URINE, RANDOM Performed at [place] [address] [place], [place] [address]    Special Requests   Final    NONE Performed at [facility], [place] [address] [place], [place] [address]    Culture   Final    NO GROWTH Performed at [facility], [place] [address], [place],  [address]    Report Status 11/11/2018 FINAL  Final         Radiology Studies: No results found.      Scheduled Meds:  allopurinol  300 mg Oral Daily   amLODipine  5 mg Oral Daily   aspirin EC  81 mg Oral Daily   calcitRIOL  0.25 mcg Oral Daily   Chlorhexidine Gluconate Cloth  6 each Topical Daily   cloNIDine  0.1 mg Oral BID   insulin aspart  0-15 Units Subcutaneous Q4H   insulin glargine  15 Units Subcutaneous Daily   levothyroxine  50 mcg Oral Q0600  mouth rinse  15 mL Mouth Rinse BID   metoprolol succinate  50 mg Oral Daily   pantoprazole  40 mg Oral Q0600   patiromer  8.4 g Oral Daily   pravastatin  80 mg Oral QHS   sodium bicarbonate  1,300 mg Oral TID   tamsulosin  0.4 mg Oral QPC breakfast   Continuous Infusions:  ampicillin-sulbactam (UNASYN) IV 3 g (11/18/18 0937)     LOS: 10 days    Time spent: 40 minutes    Irine Seal, MD [facility]  If 7PM-7AM, please contact night-coverage [email or web address] 11/18/2018, 12:32 PM

## 2018-11-18 NOTE — TOC Initial Note (Signed)
Transition of Care [facility]) - Initial/Assessment Note    Patient Details  Name: Emily Hughes MRN: [id number] Date of Birth: 1940/12/23  [facility]) CM/SW Contact:    Nila Nephew, LCSW Phone Number: Weekend coverage [phone]  11/18/2018, 2:42 PM  Clinical Narrative: Pt admitted from home where she resides alone, being treated for sepsis secondary to buttocks/gluteal abscess and for DKA. Pt receiving hydrotherapy and wound care and deconditioned- reports PTA she was ambulatory and independent. Aware SNF recommended for post acute care and she is in agreement and requests referrals.  Asks that sister be involved in care planning- CSW spoke with sister via phone (contact info below)- she is aware of current plan. Aware hydrotherapy will limit facility options. Obtained PASRR, completed FL2 and referrals. Will need follow up with bed offers and insurance authorization request from [facility].                   Expected Discharge Plan: Skilled Nursing Facility Barriers to Discharge: Continued Medical Work up, SNF Pending bed offer, Insurance Authorization   Patient Goals and CMS Choice Patient states their goals for this hospitalization and ongoing recovery are:: "get my strength back" CMS Medicare.gov Compare Post Acute Care list provided to:: Patient Choice offered to / list presented to : (will present to pt and sister once offers available)  Expected Discharge Plan and Services Expected Discharge Plan: [place] Acute Care Choice: [place] arrangements for the past 2 months: Single Family Home                                      Prior Living Arrangements/Services Living arrangements for the past 2 months: Single Family Home Lives with:: Self Patient language and need for interpreter reviewed:: No Do you feel safe going back to the place where you live?: Yes      Need for Family Participation  in Patient Care: Yes (Comment)(asks that sister be involved in care plans) Care giver support system in place?: Yes (comment)(sister and niece involved however pt lives alone)   Criminal Activity/Legal Involvement Pertinent to Current Situation/Hospitalization: No - Comment as needed  Activities of Daily Living Home Assistive Devices/Equipment: None ADL Screening (condition at time of admission) Patient's cognitive ability adequate to safely complete daily activities?: Yes Is the patient deaf or have difficulty hearing?: No Does the patient have difficulty seeing, even when wearing glasses/contacts?: No Does the patient have difficulty concentrating, remembering, or making decisions?: No Patient able to express need for assistance with ADLs?: Yes Does the patient have difficulty dressing or bathing?: Yes Independently performs ADLs?: No Does the patient have difficulty walking or climbing stairs?: No Weakness of Legs: Both Weakness of Arms/Hands: None  Permission Sought/Granted Permission sought to share information with : Family Supports Permission granted to share information with : Yes, Verbal Permission Granted  Share Information with NAME: [phone] sister Thomasenia Sales           Emotional Assessment Appearance:: Appears stated age Attitude/Demeanor/Rapport: Engaged Affect (typically observed): Flat, Pleasant Orientation: : Oriented to Self, Oriented to Place, Oriented to Situation(was not oriented to day of week) Alcohol / Substance Use: Not Applicable Psych Involvement: No (comment)  Admission diagnosis:  Cellulitis and abscess of buttock [L02.31, L03.317] Type 2 diabetes mellitus with ketoacidosis without coma, unspecified whether long term insulin use ([place]) [E11.10] Sepsis,  due to unspecified organism, unspecified whether acute organ dysfunction present [facility]) [A41.9] Patient Active Problem List   Diagnosis Date Noted  . Acute urinary retention 11/18/2018  . Acute  renal failure superimposed on stage 4 chronic kidney disease ([place]) 11/15/2018  . Acute metabolic encephalopathy 59/47/0761  . Cellulitis and abscess of buttock   . Abnormal LFTs   . Abscess of left buttock 11/08/2018  . Sepsis ([place]) 11/08/2018  . Atrial fibrillation with RVR ([place]) 11/08/2018  . Elevated troponin 11/08/2018  . Convulsions ([place]) 07/24/2015  . Type 2 diabetes mellitus with chronic kidney disease, without long-term current use of insulin ([place]) 07/24/2015  . Leukocytosis   . Chronic kidney disease (CKD), stage IV (severe) ([place]) 06/13/2015  . Seizures ([place]) 06/12/2015  . DKA (diabetic ketoacidoses) ([place]) 06/12/2015  . Seizure ([place]) 06/12/2015  . High cholesterol   . CAD (coronary artery disease)   . Proteinuria   . GERD (gastroesophageal reflux disease)   . DYSLIPIDEMIA 02/27/2010  . OBESITY, MORBID 02/27/2010  . DM 02/24/2010  . RHEUMATIC FEVER 02/24/2010  . Essential hypertension 02/24/2010  . RENAL INSUFFICIENCY, CHRONIC 02/24/2010   PCP:  Susy Frizzle, MD Pharmacy:   [facility], [place] [place] [address] Phone: [phone] Fax: [phone]  [facility]) - [place], [address] [place] [address] Phone: [phone] Fax: [phone]     Social Determinants of Health (SDOH) Interventions    Readmission Risk Interventions No flowsheet data found.

## 2018-11-18 NOTE — Progress Notes (Signed)
Rectal tube place at 1900. Foley inserted at 0500 secondary to urinary retention(600cc)/pressure sore

## 2018-11-18 NOTE — Progress Notes (Signed)
PHARMACY NOTE -  Unasyn  Pharmacy has been assisting with dosing of Unasyn for gluteal abscess.  Dosage remains stable at 3g IV q12 hr and need for further dosage adjustment appears unlikely at present given renal function stable < 30 ml/min CrCl  Pharmacy will sign off, following peripherally for culture results or dose adjustments. Please reconsult if a change in clinical status warrants re-evaluation of dosage.  Reuel Boom, PharmD, BCPS [phone] 11/18/2018, 12:11 PM

## 2018-11-19 LAB — GLUCOSE, CAPILLARY
Glucose-Capillary: 144 mg/dL — ABNORMAL HIGH (ref 70–99)
Glucose-Capillary: 152 mg/dL — ABNORMAL HIGH (ref 70–99)
Glucose-Capillary: 195 mg/dL — ABNORMAL HIGH (ref 70–99)
Glucose-Capillary: 197 mg/dL — ABNORMAL HIGH (ref 70–99)
Glucose-Capillary: 221 mg/dL — ABNORMAL HIGH (ref 70–99)
Glucose-Capillary: 226 mg/dL — ABNORMAL HIGH (ref 70–99)

## 2018-11-19 LAB — BASIC METABOLIC PANEL
Anion gap: 9 (ref 5–15)
BUN: 72 mg/dL — ABNORMAL HIGH (ref 8–23)
CO2: 18 mmol/L — ABNORMAL LOW (ref 22–32)
Calcium: 8.8 mg/dL — ABNORMAL LOW (ref 8.9–10.3)
Chloride: 109 mmol/L (ref 98–111)
Creatinine, Ser: 2.44 mg/dL — ABNORMAL HIGH (ref 0.44–1.00)
GFR calc Af Amer: 22 mL/min — ABNORMAL LOW (ref >60)
GFR calc non Af Amer: 19 mL/min — ABNORMAL LOW (ref >60)
Glucose, Bld: 140 mg/dL — ABNORMAL HIGH (ref 70–99)
Potassium: 4.8 mmol/L (ref 3.5–5.1)
Sodium: 136 mmol/L (ref 135–145)

## 2018-11-19 LAB — CBC WITH DIFFERENTIAL/PLATELET
Abs Immature Granulocytes: 0.34 10*3/uL — ABNORMAL HIGH (ref 0.00–0.07)
Basophils Absolute: 0 10*3/uL (ref 0.0–0.1)
Basophils Relative: 0 %
Eosinophils Absolute: 0.2 10*3/uL (ref 0.0–0.5)
Eosinophils Relative: 1 %
HCT: 26.3 % — ABNORMAL LOW (ref 36.0–46.0)
Hemoglobin: 8.2 g/dL — ABNORMAL LOW (ref 12.0–15.0)
Immature Granulocytes: 2 %
Lymphocytes Relative: 15 %
Lymphs Abs: 2.3 10*3/uL (ref 0.7–4.0)
MCH: 27.2 pg (ref 26.0–34.0)
MCHC: 31.2 g/dL (ref 30.0–36.0)
MCV: 87.4 fL (ref 80.0–100.0)
Monocytes Absolute: 1.4 10*3/uL — ABNORMAL HIGH (ref 0.1–1.0)
Monocytes Relative: 9 %
Neutro Abs: 11.7 10*3/uL — ABNORMAL HIGH (ref 1.7–7.7)
Neutrophils Relative %: 73 %
Platelets: 408 10*3/uL — ABNORMAL HIGH (ref 150–400)
RBC: 3.01 MIL/uL — ABNORMAL LOW (ref 3.87–5.11)
RDW: 16.1 % — ABNORMAL HIGH (ref 11.5–15.5)
WBC: 15.9 10*3/uL — ABNORMAL HIGH (ref 4.0–10.5)
nRBC: 0 % (ref 0.0–0.2)

## 2018-11-19 MED ORDER — LOPERAMIDE HCL 2 MG PO CAPS
2.0000 mg | ORAL_CAPSULE | Freq: Once | ORAL | Status: AC
  Administered 2018-11-19: 2 mg via ORAL
  Filled 2018-11-19: qty 1

## 2018-11-19 MED ORDER — INSULIN ASPART 100 UNIT/ML ~~LOC~~ SOLN
0.0000 [IU] | Freq: Three times a day (TID) | SUBCUTANEOUS | Status: DC
  Administered 2018-11-19: 3 [IU] via SUBCUTANEOUS
  Administered 2018-11-19: 5 [IU] via SUBCUTANEOUS
  Administered 2018-11-20: 3 [IU] via SUBCUTANEOUS
  Administered 2018-11-20: 8 [IU] via SUBCUTANEOUS
  Administered 2018-11-20: 11 [IU] via SUBCUTANEOUS
  Administered 2018-11-21 (×3): 5 [IU] via SUBCUTANEOUS
  Administered 2018-11-22: 3 [IU] via SUBCUTANEOUS
  Administered 2018-11-22 – 2018-11-23 (×3): 5 [IU] via SUBCUTANEOUS
  Administered 2018-11-23: 3 [IU] via SUBCUTANEOUS
  Administered 2018-11-23: 2 [IU] via SUBCUTANEOUS
  Administered 2018-11-24 – 2018-11-25 (×3): 3 [IU] via SUBCUTANEOUS
  Administered 2018-11-25: 5 [IU] via SUBCUTANEOUS
  Administered 2018-11-26 (×2): 2 [IU] via SUBCUTANEOUS
  Administered 2018-11-26: 3 [IU] via SUBCUTANEOUS
  Administered 2018-11-27: 8 [IU] via SUBCUTANEOUS
  Administered 2018-11-27: 11 [IU] via SUBCUTANEOUS
  Administered 2018-11-27: 8 [IU] via SUBCUTANEOUS
  Administered 2018-11-28 (×2): 5 [IU] via SUBCUTANEOUS

## 2018-11-19 NOTE — Progress Notes (Signed)
PROGRESS NOTE    Emily Hughes  URK:[id number] DOB: 10-23-1940 DOA: 11/08/2018 PCP: Susy Frizzle, MD   Brief Narrative:  HPI per Dr. Toy Baker on 11/08/2018  Patient is a 78 year old African-American female with a past medical history significant for but not limited to diabetes mellitus type 2, CKD stage IV, hypothyroidism, CAD, GERD, hypertension, hyperlipidemia, history of seizure disorder, ? Atrial fibrillation, long with comorbidities who presented to the emergency room with a 3-day history of pain and swelling in her buttocks along with a fever of 102.  Family reportedly gave her some Tylenol that helped her improve her temperature down to 99 but she is found to have elevated blood pressures for last few days when in the 400s.  She has not been taking her insulin for last 2 days and has not been checking her glucose regularly.  On admission she stated that the left buttock pain lasted for last few days and was severe 10 out of 10 and reportedly was a boil that then progressed.  She was worked up and admitted for sepsis secondary to abscess of the left buttock as well as DKA.  She was placed in the stepdown unit and was placed on glucose stabilizer and has improvement in her blood sugars.  She was incidentally noted to be in atrial fibrillation/A Flutter which then converted to NSR a few days ago.  Cardiology was consulted for preoperative clearance and patient was taken for incision and drainage of her abscess on her left buttock and she is POD2. DKA improved and she was transitioned to Long Acting Insulin even though Her CO2 is not >20 given that she has chronic Metabolic Acidosis 2/2 to her Renal Disease and is on Sodium Bicarbonate. She tolerated her Diet without issues but complained of some Abdominal Pain.   Liver function tests were elevated and right upper quadrant ultrasound was done and showed possible acute cholecystitis.  I discussed with general surgery Dr. Jens Som  who recommended obtaining a HIDA scan however HIDA scan with EF is not able to be done at this time and if EF is needed to be done will come will be on Tuesday per nursing. So will obtain a HIDA scan without the EF currently.  And this HIDA scan was normal hepatobiliary scan so Dr. Windle Guard was less inclined for acute cholecystitis and felt that her LFTs being elevated could be reactionary.  We will continue to monitor closely and antibiotics have been de-escalated to just IV cefepime Flagyl for now and will continue to de-escalate accordingly.  Echocardiogram was done yesterday.  On 11/12/2018 the patient was lethargic and slower to respond.  Obtained an ABG which showed some mild hypoxemia and ammonia level was 18.  Head CT was done and showed no acute intracranial findings and mild atrophy and white matter microvascular disease.  WBC is slowly trending up so repeat cultures were ordered including a chest x-ray, blood cultures, urinalysis.  Chest x-ray showed minimal bibasilar segment atelectasis.  Patient was complaining of left foot pain so foot x-ray was also obtained findings.  Patient's somnolence does not improve will obtain a neurologic consult in the a.m.  Repeat urinalysis showed cloudy urine and small hemoglobin along with moderate leukocytes, few bacteria and 11-20 BCs.  She was on IV antibiotics.   11/13/2018 remained somnolent (received 10 mg of po Hydrocodone this AM) so narcotics have been adjusted and IV morphine has been discontinued along with p.o. hydrocodone.  We will continue to monitor and  white blood cell count is now trending down but in general surgery is recommending hydrotherapy but no further surgical intervention.  Today 11/14/2018 was still a little lethargic but improving.  Patient started hydrotherapy and antibiotics have been further adjusted from IV cefepime to IV Unasyn.  White blood cell count remains somewhat elevated but could be secondary to pain.  We will continue  monitor patient mental status given her changes in her opiate regimen and may need to adjust further.     Assessment & Plan:   Principal Problem:   Sepsis ([place]) Active Problems:   Abscess of left buttock   DKA (diabetic ketoacidoses) (HCC)   Acute renal failure superimposed on stage 4 chronic kidney disease (HCC)   Essential hypertension   CAD (coronary artery disease)   GERD (gastroesophageal reflux disease)   Chronic kidney disease (CKD), stage IV (severe) (HCC)   Atrial fibrillation with RVR (HCC)   Elevated troponin   Abnormal LFTs   Acute metabolic encephalopathy   Cellulitis and abscess of buttock   Acute urinary retention  1 sepsis secondary to left buttocks/gluteal abscess status post incision and drainage of abscess POD #8. Patient was admitted meeting sepsis criteria with fever, leukocytosis, tachycardia(new onset atrial fibrillation).  Sepsis protocol was initiated in the ED.  Sepsis physiology improving as patient currently afebrile.  Tachycardia improving.  Patient still with a leukocytosis which is fluctuating and currently at 22.6 from 24.6.  Blood cultures with no growth to date x5 days.  Urine culture is negative.  MRSA PCR negative.  COVID-19 negative.  Cultures from left buttocks abscess positive for E. coli which is pansensitive.  Patient was seen in consultation by general surgery and underwent incision and drainage of abscess with cultures sent.  Procalcitonin level was 6.89 and trended down to 4.82.  Lactic acid level trended down from 2.9-1.0.  Patient initially placed on IV vancomycin, IV cefepime and IV metronidazole.  Antibiotics narrowed down patient currently on IV Unasyn.  Patient noted to have ongoing leukocytosis repeat urinalysis showed moderate leukocytes, nitrite negative, few bacteria, 11-20 WBCs.  Repeat chest x-ray done 11/12/2018- for any acute infiltrate however consistent with atelectasis.  Patient currently receiving hydrotherapy which was started on  11/14/2018.  Patient also on twice daily wet-to-dry dressing changes per general surgery.  Continue empiric IV Unasyn and could likely discontinue Unasyn for 2 days doses.  Per general surgery.  Follow.   2.  Diabetes mellitus type 2/DKA Patient had presented in DKA felt secondary to problem #1.  Hemoglobin A1c 8.4.  On admission patient noted to have a beta hydroxybutyric acid of 2.29.  Patient was placed on the glucose stabilizer and subsequently transition to subcutaneous Lantus at 15 units daily as well as sliding scale insulin and meal coverage NovoLog of 6 units 3 times daily with meals.  Patient with improving oral intake.   CBG of 164 this morning.  Continue Lantus 15 units daily and may need to decrease pending blood glucose levels.  Discontinued meal coverage NovoLog.  Continue sliding scale insulin.  Follow.  3.  New onset/Transient atrial fibrillation/atrial flutter with variable block status post conversion to normal sinus rhythm with transient third-degree AV block. Felt likely in the setting of sepsis secondary to problem #1.  Patient converted to normal sinus rhythm with a short episode of third-degree AV block overnight while asleep thought possibly secondary to AV nodal blocking drugs and sleep apnea.  Patient initially on a Cardizem drip and subsequently transitioned to oral  Cardizem which has subsequently been discontinued.  Patient seen in consultation by cardiology who feels no need for anticoagulation at this time given short duration and need for surgical intervention.  Continue current regimen of Toprol-XL 50 mg daily.  Appreciate cardiology input and recommendations.  4.  Nausea Patient with complaints of nausea yesterday morning which has since resolved. Patient also with some complaints of constipation not sure the last time she had bowel movement.  Continue daily Dulcolax suppositories.  Patient given soapsuds enema x1 with no bowel movement.  Patient noted to have multiple loose  stools on 11/17/2018.  Dulcolax suppositories and MiraLAX have been discontinued.  Nausea improved.  Follow.  5.  Hypertension Blood pressure improved.  Continue Toprol-XL, clonidine, Norvasc.  Follow.    6.  Elevated troponin Felt secondary to demand ischemia in the setting of sepsis, elevated lactic acid and acute on chronic kidney failure.  EKG with question of flutter versus sinus tach with PACs.  Repeat EKG showed a normal sinus rhythm and evidence of LVH with no signs of ischemia noted.  Patient seen by cardiology who felt no further ischemic work-up needed at this time.  Continue aspirin 81 mg daily, Toprol-XL, statin.  Follow.  7.  Acute on chronic kidney disease stage IV/chronic metabolic acidosis Felt secondary to prerenal azotemia in the setting of sepsis.  Patient on admission noted to have a creatinine of 4.76.  Patient placed on IV fluids which have subsequently been discontinued.  Renal function improved and currently close to baseline.  Creatinine at 2.44.  Continue calcitriol.  Acidosis improved with increased bicarb tablets of 1300 mg p.o. 3 times daily.  Outpatient follow-up with nephrology.   8.  Hypothyroidism TSH of 1.815 on 11/09/2018.  Continue Synthroid.  Outpatient follow-up.  9.  Coronary artery disease Currently stable.  Patient denies any acute chest pain.  Patient was seen by cardiology during the hospitalization secondary to new onset atrial flutter with variable block.  Continue current cardiac medications of aspirin, Toprol-XL, statin, Norvasc.  Outpatient follow-up with cardiology.  10.  Hyponatremia Improved.  11.  Hypermagnesemia Monitor.  Magnesium level at 2.9.  Follow.  12.  Acute encephalopathy/somnolence and lethargy Questionable etiology.  Likely medication induced.  Patient noted to be somnolent and lethargic however improving daily, patient answering questions appropriately and following commands.  Likely close to baseline.  Head CT done was negative  for any acute abnormalities.  ABG with a pH of 7.383/PCO2 of 27/PO2 of 77.  Chest x-ray negative, blood cultures negative to date, urine cultures negative.  Patient being treated for left gluteal abscess on empiric IV antibiotics.  Continue to limit sedating medications.  IV morphine has been discontinued.  Patient now on oxycodone.  Patient seems to be slowly improving following commands appropriately and answering some questions.  EEG done consistent with generalized nonspecific cerebral dysfunction/encephalopathy.  No seizures or seizure predisposition recorded on the study.  Monitor for now.  13.  Morbid obesity Weight loss and dietary counseling given.  14.  History of gout Colchicine on hold.  Continue allopurinol.  15.  Abnormal LFTs Likely reactive.  LFTs trended down.  Acute hepatitis panel was negative.  Right upper quadrant ultrasound showed gallbladder sludge and positive sonographic Murphy sign however no biliary duct dilatation.  HIDA scan which was done was normal.  Dr. Alfredia Ferguson discussed the case with general surgery who recommended no further intervention at this time and to continue to trend hepatic function.  16.  Normocytic anemia/anemia  of chronic kidney disease Likely dilutional and postoperative drop.  Hemoglobin was 11.4 and dropped to 7.7 and now at 8.2.  Patient with no overt bleeding. Anemia panel consistent with anemia of chronic disease.  Follow H&H.  Transfusion threshold hemoglobin less than 7.  17.  Leukocytosis Likely secondary to problem #1.  WBC was fluctuating.  Patient afebrile.  Patient has been pancultured with blood cultures with no growth to date, urine culture is negative, chest x-ray with no acute infiltrate. COVID-19 negative. Patient with no respiratory symptoms.  Patient with left gluteal abscess that has been I&D and cultures positive for E. coli.  Patient currently on IV Unasyn.  Will discontinue IV Levaquin after today's doses.  18. gastroesophageal  reflux disease Continue PPI.  19.  Loose stools Patient noted to have loose stools per RN.  Discontinued Dulcolax and MiraLAX.  Loose stools improving.  Follow.  20.  Hyperkalemia Resolved with resumption of home regimen of Veltassa.  21.  urinary retention Foley catheter placed back in due to urinary retention.  Continue Flomax.  Likely voiding trial in the next 2 to 3 days.  Follow.    DVT prophylaxis: SCDs Code Status: Full Family Communication: Updated patient.  No family at bedside. Disposition Plan: Likely need SNF on discharge.    Consultants:   Cardiology: Dr. Meda Coffee 11/09/2018  General surgery: Dr. Marlou Starks III 11/08/2018  Procedures:   CT head 11/12/2018  CT pelvis 11/08/2018  Chest x-ray 11/08/2018, 11/12/2018, 11/13/2018, 11/15/2018  Plain films of the left foot 11/12/2018  Limited echocardiogram 11/11/2018  Right upper quadrant ultrasound 11/11/2018  Incision and drainage of left buttocks abscess 11/09/2018 per Dr. Ninfa Linden  EEG 11/15/2018  Antimicrobials:   Oral Flagyl 11/13/2018>>>> 11/14/2018  IV Unasyn 11/14/2018>>>> 11/19/2018  IV cefepime 11/08/2018>>>> 11/14/2018  IV Flagyl 11/08/2018>>>>> 11/13/2018  IV vancomycin 11/08/2018>>>> 11/11/2018   Subjective: Patient sleeping however easily arousable.  States she is feeling better daily.  Wound pain improving.  No chest pain.  No shortness of breath.    Objective: Vitals:   11/18/18 0817 11/18/18 1536 11/18/18 2104 11/19/18 0512  BP:  110/61 129/69 130/69  Pulse:  87 90 86  Resp:  20 17 19   Temp:  98.2 F (36.8 C) 99 F (37.2 C) 98.4 F (36.9 C)  TempSrc:  Oral Oral Oral  SpO2: 95% 100% 98% 96%  Weight:    114.5 kg  Height:        Intake/Output Summary (Last 24 hours) at 11/19/2018 1225 Last data filed at 11/18/2018 1700 Gross per 24 hour  Intake 450 ml  Output 800 ml  Net -350 ml   Filed Weights   11/13/18 0400 11/17/18 0837 11/19/18 0512  Weight: 113 kg 115 kg 114.5 kg    Examination:  General exam:  NAD Respiratory system: CTAB.  No wheezes, no crackles, no rhonchi.  Cardiovascular system: RRR no murmur rubs or gallops.  No JVD.  No lower extremity edema.  Gastrointestinal system: Abdomen is nontender, nondistended, soft, positive bowel sounds.  No rebound.  No guarding.  Central nervous system: Alert. No focal neurological deficits.  Moving extremities spontaneously. Extremities: Symmetric 5 x 5 power. Skin: Left gluteal wound with some fibrinous area with some necrotic edges with minimal drainage. Psychiatry: Judgement and insight appear fair. Mood & affect appropriate.     Data Reviewed: I have personally reviewed following labs and imaging studies  CBC: Recent Labs  Lab 11/15/18 0231 11/16/18 0230 11/16/18 1412 11/17/18 0645 11/18/18 2841 11/19/18 3244  WBC 22.6* 19.3*  --  22.1* 17.0* 15.9*  NEUTROABS 16.5* 14.4*  --  17.7* 12.9* 11.7*  HGB 8.7* 7.7* 8.4* 8.9* 8.7* 8.2*  HCT 26.6* 25.0* 26.2* 27.8* 28.4* 26.3*  MCV 83.6 85.0  --  82.7 86.3 87.4  PLT 351 336  --  391 416* 568*   Basic Metabolic Panel: Recent Labs  Lab 11/13/18 0223 11/14/18 0245 11/15/18 0231 11/16/18 0230 11/17/18 0645 11/18/18 0629 11/19/18 0525  NA 135 138 142 142 137 135 136  K 4.6 4.6 4.9 4.5 5.7* 4.5 4.8  CL 108 111 117* 118* 110 108 109  CO2 17* 17* 17* 19* 16* 19* 18*  GLUCOSE 135* 102* 76 107* 178* 160* 140*  BUN 102* 98* 95* 92* 84* 80* 72*  CREATININE 2.96* 2.70* 2.71* 2.73* 2.53* 2.46* 2.44*  CALCIUM 8.3* 8.5* 8.6* 8.4* 8.8* 8.8* 8.8*  MG 2.5* 2.5* 2.7* 2.9*  --   --   --   PHOS 4.1 4.2 3.9 4.1 4.2  --   --    GFR: Estimated Creatinine Clearance: 23.9 mL/min (A) (by C-G formula based on SCr of 2.44 mg/dL (H)). Liver Function Tests: Recent Labs  Lab 11/13/18 0223 11/14/18 0245 11/15/18 0231 11/16/18 0230 11/17/18 0645  AST 28 27 26   --   --   ALT 26 25 22   --   --   ALKPHOS 70 75 68  --   --   BILITOT 0.8 0.6 0.7  --   --   PROT 5.5* 6.1* 5.9*  --   --   ALBUMIN  2.0* 2.0* 1.9* 1.7* 2.0*   No results for input(s): LIPASE, AMYLASE in the last 168 hours. No results for input(s): AMMONIA in the last 168 hours. Coagulation Profile: No results for input(s): INR, PROTIME in the last 168 hours. Cardiac Enzymes: No results for input(s): CKTOTAL, CKMB, CKMBINDEX, TROPONINI in the last 168 hours. BNP (last 3 results) No results for input(s): PROBNP in the last 8760 hours. HbA1C: No results for input(s): HGBA1C in the last 72 hours. CBG: Recent Labs  Lab 11/18/18 2101 11/19/18 0022 11/19/18 0358 11/19/18 0754 11/19/18 1136  GLUCAP 203* 197* 144* 152* 195*   Lipid Profile: No results for input(s): CHOL, HDL, LDLCALC, TRIG, CHOLHDL, LDLDIRECT in the last 72 hours. Thyroid Function Tests: No results for input(s): TSH, T4TOTAL, FREET4, T3FREE, THYROIDAB in the last 72 hours. Anemia Panel: No results for input(s): VITAMINB12, FOLATE, FERRITIN, TIBC, IRON, RETICCTPCT in the last 72 hours. Sepsis Labs: Recent Labs  Lab 11/13/18 0223 11/14/18 0245  PROCALCITON 6.01 4.82    Recent Results (from the past 240 hour(s))  Aerobic/Anaerobic Culture (surgical/deep wound)     Status: None   Collection Time: 11/09/18  2:06 PM  Result Value Ref Range Status   Specimen Description   Final    ABSCESS LEFT BUTTOCKS Performed at [facility] [address] [place], [place] [address]    Special Requests   Final    NONE Performed at [facility], [place] [address] [place], [place] [address]    Gram Stain   Final    ABUNDANT WBC PRESENT,BOTH PMN AND MONONUCLEAR ABUNDANT GRAM POSITIVE COCCI ABUNDANT GRAM NEGATIVE RODS    Culture   Final    FEW ESCHERICHIA COLI FEW BACTEROIDES SPECIES BETA LACTAMASE POSITIVE FEW ACTINOMYCES SPECIES Standardized susceptibility testing for this organism is not available. Performed at [facility], [place] [address], [place], [place] [address]  Report Status 11/15/2018 FINAL   Final   Organism ID, Bacteria ESCHERICHIA COLI  Final      Susceptibility   Escherichia coli - MIC*    AMPICILLIN <=2 SENSITIVE Sensitive     CEFAZOLIN <=4 SENSITIVE Sensitive     CEFEPIME <=1 SENSITIVE Sensitive     CEFTAZIDIME <=1 SENSITIVE Sensitive     CEFTRIAXONE <=1 SENSITIVE Sensitive     CIPROFLOXACIN <=0.25 SENSITIVE Sensitive     GENTAMICIN <=1 SENSITIVE Sensitive     IMIPENEM <=0.25 SENSITIVE Sensitive     TRIMETH/SULFA <=20 SENSITIVE Sensitive     AMPICILLIN/SULBACTAM <=2 SENSITIVE Sensitive     PIP/TAZO <=4 SENSITIVE Sensitive     Extended ESBL NEGATIVE Sensitive     * FEW ESCHERICHIA COLI  Urine Culture     Status: None   Collection Time: 11/10/18 12:30 PM  Result Value Ref Range Status   Specimen Description   Final    URINE, RANDOM Performed at [facility] [address], [place], [place] [address]    Special Requests   Final    NONE Performed at [facility], [place] [address], [place], [place] [address]    Culture   Final    NO GROWTH Performed at [facility], [place] [address], [place], [place] [address]    Report Status 11/11/2018 FINAL  Final         Radiology Studies: No results found.      Scheduled Meds:  allopurinol  300 mg Oral Daily   amLODipine  2.5 mg Oral Daily   aspirin EC  81 mg Oral Daily   calcitRIOL  0.25 mcg Oral Daily   Chlorhexidine Gluconate Cloth  6 each Topical Daily   cloNIDine  0.1 mg Oral BID   insulin aspart  0-15 Units Subcutaneous TID WC   insulin glargine  15 Units Subcutaneous Daily   levothyroxine  50 mcg Oral Q0600   mouth rinse  15 mL Mouth Rinse BID   metoprolol succinate  50 mg Oral Daily   pantoprazole  40 mg Oral Q0600   patiromer  8.4 g Oral Daily   pravastatin  80 mg Oral QHS   sodium bicarbonate  1,300 mg Oral TID   tamsulosin  0.4 mg Oral QPC breakfast   Continuous Infusions:  ampicillin-sulbactam (UNASYN) IV 3 g (11/19/18 1105)      LOS: 11 days    Time spent: 40 minutes    Irine Seal, MD [facility]  If 7PM-7AM, please contact night-coverage [email or web address] 11/19/2018, 12:25 PM

## 2018-11-20 LAB — CBC WITH DIFFERENTIAL/PLATELET
Abs Immature Granulocytes: 0.31 10*3/uL — ABNORMAL HIGH (ref 0.00–0.07)
Basophils Absolute: 0 10*3/uL (ref 0.0–0.1)
Basophils Relative: 0 %
Eosinophils Absolute: 0.1 10*3/uL (ref 0.0–0.5)
Eosinophils Relative: 1 %
HCT: 23.3 % — ABNORMAL LOW (ref 36.0–46.0)
Hemoglobin: 7.3 g/dL — ABNORMAL LOW (ref 12.0–15.0)
Immature Granulocytes: 2 %
Lymphocytes Relative: 17 %
Lymphs Abs: 2.3 10*3/uL (ref 0.7–4.0)
MCH: 26.9 pg (ref 26.0–34.0)
MCHC: 31.3 g/dL (ref 30.0–36.0)
MCV: 86 fL (ref 80.0–100.0)
Monocytes Absolute: 1 10*3/uL (ref 0.1–1.0)
Monocytes Relative: 7 %
Neutro Abs: 9.8 10*3/uL — ABNORMAL HIGH (ref 1.7–7.7)
Neutrophils Relative %: 73 %
Platelets: 396 10*3/uL (ref 150–400)
RBC: 2.71 MIL/uL — ABNORMAL LOW (ref 3.87–5.11)
RDW: 15.9 % — ABNORMAL HIGH (ref 11.5–15.5)
WBC: 13.5 10*3/uL — ABNORMAL HIGH (ref 4.0–10.5)
nRBC: 0 % (ref 0.0–0.2)

## 2018-11-20 LAB — BASIC METABOLIC PANEL
Anion gap: 6 (ref 5–15)
BUN: 68 mg/dL — ABNORMAL HIGH (ref 8–23)
CO2: 21 mmol/L — ABNORMAL LOW (ref 22–32)
Calcium: 8.4 mg/dL — ABNORMAL LOW (ref 8.9–10.3)
Chloride: 107 mmol/L (ref 98–111)
Creatinine, Ser: 2.61 mg/dL — ABNORMAL HIGH (ref 0.44–1.00)
GFR calc Af Amer: 20 mL/min — ABNORMAL LOW (ref >60)
GFR calc non Af Amer: 17 mL/min — ABNORMAL LOW (ref >60)
Glucose, Bld: 215 mg/dL — ABNORMAL HIGH (ref 70–99)
Potassium: 5.1 mmol/L (ref 3.5–5.1)
Sodium: 134 mmol/L — ABNORMAL LOW (ref 135–145)

## 2018-11-20 LAB — GLUCOSE, CAPILLARY
Glucose-Capillary: 189 mg/dL — ABNORMAL HIGH (ref 70–99)
Glucose-Capillary: 276 mg/dL — ABNORMAL HIGH (ref 70–99)
Glucose-Capillary: 311 mg/dL — ABNORMAL HIGH (ref 70–99)
Glucose-Capillary: 219 mg/dL — ABNORMAL HIGH (ref 70–99)

## 2018-11-20 MED ORDER — INSULIN GLARGINE 100 UNIT/ML ~~LOC~~ SOLN
18.0000 [IU] | Freq: Every day | SUBCUTANEOUS | Status: DC
  Administered 2018-11-21 – 2018-11-22 (×2): 18 [IU] via SUBCUTANEOUS
  Filled 2018-11-20 (×2): qty 0.18

## 2018-11-20 MED ORDER — ENSURE ENLIVE PO LIQD
237.0000 mL | Freq: Two times a day (BID) | ORAL | Status: DC
  Administered 2018-11-21 – 2018-11-26 (×7): 237 mL via ORAL

## 2018-11-20 MED ORDER — JUVEN PO PACK
1.0000 | PACK | Freq: Two times a day (BID) | ORAL | Status: DC
  Administered 2018-11-20 – 2018-11-29 (×15): 1 via ORAL
  Filled 2018-11-20 (×19): qty 1

## 2018-11-20 MED ORDER — INSULIN ASPART 100 UNIT/ML ~~LOC~~ SOLN
3.0000 [IU] | Freq: Three times a day (TID) | SUBCUTANEOUS | Status: DC
  Administered 2018-11-20 – 2018-11-22 (×6): 3 [IU] via SUBCUTANEOUS

## 2018-11-20 NOTE — Progress Notes (Signed)
Initial Nutrition Assessment  RD working remotely.   DOCUMENTATION CODES:   Morbid obesity  INTERVENTION:  - will order Ensure Enlive BID, each supplement provides 350 kcal and 20 grams of protein. - will order Juven BID, each packet provides 90 calories, 2.5 grams of protein, 8 grams of carbohydrate, and 14 grams of amino acids; supplement contains CaHMB, glutamine, and arginine, to promote wound healing. - continue to encourage PO intakes.    NUTRITION DIAGNOSIS:   Increased nutrient needs related to acute illness, wound healing as evidenced by estimated needs.  GOAL:   Patient will meet greater than or equal to 90% of their needs  MONITOR:   PO intake, Supplement acceptance, Labs, Weight trends, Skin  REASON FOR ASSESSMENT:   LOS(day #12)  ASSESSMENT:   78 year old female with a past medical history significant for type 2 DM, CKD stage 4, hypothyroidism, CAD, GERD, HTN, hyperlipidemia, history of seizure disorder, and ?Afib. She presented to the ED on 5/6 d/t 3 day history of pain and swelling in L buttocks and fever of up to 102 degrees. She was not taking insulin x2 days PTA and has not been checking CBGs regularly. She was admitted for sepsis 2/2 abscess of the left buttock and DKA. She was noted to be in Afib/A Flutter which then converted to NSR. Cardiology was consulted for preoperative clearance and patient was taken for I&D of L buttocks abscess. LFTs were elevated and RUQ ultrasound was done and showed possible acute cholecystitis-s/p HIDA scan was normal and it was felt elevated LFTs were reactionary. Head CT on 5/10 d/t increased lethargy and showed mild atrophy and white matter microvascular disease.  Diet changed from Soft to Heart Healthy/Carb Modified on 5/9. Per RN flow sheet, patient consumed 0% of all meals on 5/13; 25% of dinner 5/14; 50% of breakfast and 65% of lunch, and 70% of dinner on 5/16. Per chart review, current weight is 252 lb which is up from  admission weight of 246 lb. Admission weight used to estimate nutrition needs. Weight on 09/22/18 was 241 lb and weight on 08/03/18 was 238 lb.   S/p I&D patient has been receiving hydrotherapy. Per CSW note this AM, patient has a bed offer at [facility], but that facility does not provide hydrotherapy.   Per notes: sepsis 2/2 L buttocks abscess s/p I&D--improving, ongoing leukocytosis, DKA on admission which is thought to have been 2/2 sepsis and infection--HgbA1c: 8.4% with adjustments made to insulin regimen, new onset/transient afib/a flutter, nausea 5/16 AM--resolved, acute on CKD stage 4, hyponatremia--improved, s/p EEG d/t acute encephalopathy/somnolence/lethargy--improved, urinary retention with Foley in place. Notes indicate patient will likely need SNF at d/c.     Medications reviewed; sliding scale novolog, 15 units lantus/day, 50 mcg oral synthroid/day, 2 mg imodium x1 dose 5/17, 1300 mg oral sodium bicarb TID.  Labs reviewed; CBGs: 189 and 276 mg/dl today, Na: 134 mmol/l, BUN: 68 mg/dl, creatinine: 2.61 mg/dl, Ca: 8.4 mg/dl.    NUTRITION - FOCUSED PHYSICAL EXAM:  unable to perform at this time.   Diet Order:   Diet Order            Diet heart healthy/carb modified Room service appropriate? Yes; Fluid consistency: Thin  Diet effective now              EDUCATION NEEDS:   No education needs have been identified at this time  Skin:  Skin Assessment: Skin Integrity Issues: Skin Integrity Issues:: Other (Comment) Other: L buttocks abscess s/p I&D on  5/11  Last BM:  5/17  Height:   Ht Readings from Last 1 Encounters:  11/09/18 5\' 3"  (1.6 m)    Weight:   Wt Readings from Last 1 Encounters:  11/19/18 114.5 kg    Ideal Body Weight:  52.3 kg  BMI:  Body mass index is 44.72 kg/m.  Estimated Nutritional Needs:   Kcal:  1785-2010 kcal  Protein:  100-110 grams  Fluid:  >/= 1.8 L/day      Jarome Matin, MS, RD, LDN, [facility] Inpatient Clinical Dietitian Pager  # [phone] After hours/weekend pager # [phone]

## 2018-11-20 NOTE — TOC Progression Note (Signed)
Transition of Care [facility]) - Progression Note    Patient Details  Name: Emily Hughes MRN: [id number] Date of Birth: [birth year]  Transition of Care [facility]) CM/SW Contact  Servando Snare, [place] Phone Number: 11/20/2018, 10:51 AM  Clinical Narrative:   Patient has one bed offer at [facility], however [facility] does not provide [facility].   Patient needs and insurance are a barrier to placement.   Patient needs hydrotherapy facilities in the area that provide hydrotherapy do not accept HTA.  Facilities out of the area that provide hydro therapy do not accept HTA.   LCSW will continue to follow for disposition.     Expected Discharge Plan: [place] Barriers to Discharge: Continued Medical Work up, SNF Pending bed offer, Ship broker  Expected Discharge Plan and Services Expected Discharge Plan: [place] Choice: [place] arrangements for the past 2 months: Single Family Home                                       Social Determinants of Health (SDOH) Interventions    Readmission Risk Interventions No flowsheet data found.

## 2018-11-20 NOTE — Progress Notes (Addendum)
Inpatient Diabetes Program Recommendations  AACE/ADA: New Consensus Statement on Inpatient Glycemic Control (2015)  Target Ranges:  Prepandial:   less than 140 mg/dL      Peak postprandial:   less than 180 mg/dL (1-2 hours)      Critically ill patients:  140 - 180 mg/dL   Results for Emily Hughes, Emily Hughes (MRN [id number]) as of 11/20/2018 10:17  Ref. Range 11/19/2018 07:54 11/19/2018 11:36 11/19/2018 16:42 11/19/2018 20:36  Glucose-Capillary Latest Ref Range: 70 - 99 mg/dL 152 (H)  3 units NOVOLOG  195 (H)  3 units NOVOLOG +  15 units LANTUS  226 (H)  5 units NOVOLOG  221 (H)   Results for Emily Hughes, Emily Hughes (MRN [id number]) as of 11/20/2018 10:17  Ref. Range 11/20/2018 07:32  Glucose-Capillary Latest Ref Range: 70 - 99 mg/dL 189 (H)  3 units NOVOLOG      Home DM Meds: Lantus 15 units Daily       Novolog Moderate Correction Scale/ SSI (0-15 units) TID AC   Current Orders: Lantus 30 units Daily      Novolog 10 units Daily per SSI      Amaryl 4 mg Daily     MD- Please consider the following in-hospital insulin adjustments:   1. Increase Lantus slightly to 18 units Daily   2. Start Novolog Meal Coverage: Novolog 3 units TID with meals  (Please add the following Hold Parameters: Hold if pt eats <50% of meal, Hold if pt NPO)     --Will follow patient during hospitalization--  Wyn Quaker RN, MSN, CDE Diabetes Coordinator Inpatient Glycemic Control Team Team Pager: [phone] (8a-5p)

## 2018-11-20 NOTE — Progress Notes (Signed)
Physical Therapy Treatment Patient Details Name: Emily Hughes MRN: [id number] DOB: 1940-10-20 Today's Date: 11/20/2018    History of Present Illness 78 year old female admitted for wound infection and hyperglycemia. PMH:  DM, CKD, A Fib, CAD, HTN and seizure disorder    PT Comments    Pt continues to report gout pain in LEs (R>L). Pt was able to sit EOB for at least 8 minutes on today. She performed a few LE exercises. She is still unable to stand 2*signficant pain in LEs. Will continue to follow and progress activity as tolerate. Pt will need SNF for ST rehab.    Follow Up Recommendations  SNF     Equipment Recommendations  None recommended by PT    Recommendations for Other Services       Precautions / Restrictions Precautions Precautions: Fall Precaution Comments: gout pain in R foot/knee Restrictions Weight Bearing Restrictions: No    Mobility  Bed Mobility Overal bed mobility: Needs Assistance Bed Mobility: Supine to Sit;Sit to Supine Rolling: Max assist;+2 for physical assistance;+2 for safety/equipment   Supine to sit: Max assist;+2 for physical assistance Sit to supine: Max assist;+2 for physical assistance   General bed mobility comments: assist for trunk and legs in both directions, incr time, utilized bedpad to aid with scooting.  cues to assist self.   Transfers                 General transfer comment: NT-pt unable today 2* gout pain  Ambulation/Gait                 Stairs             Wheelchair Mobility    Modified Rankin (Stroke Patients Only)       Balance                                            Cognition Arousal/Alertness: Awake/alert Behavior During Therapy: WFL for tasks assessed/performed Overall Cognitive Status: Within Functional Limits for tasks assessed                                        Exercises General Exercises - Lower Extremity Ankle Circles/Pumps:  AROM;Both;10 reps;Seated Long Arc Quad: AROM;10 reps;Seated    General Comments        Pertinent Vitals/Pain Pain Assessment: Faces Faces Pain Scale: Hurts worst Pain Location: foot/knee R>L Pain Descriptors / Indicators: Grimacing;Moaning;Discomfort;Tender Pain Intervention(s): Limited activity within patient's tolerance;Repositioned    Home Living                      Prior Function            PT Goals (current goals can now be found in the care plan section) Progress towards PT goals: Progressing toward goals    Frequency    Min 2X/week(may need to see more often for insurance notes)      PT Plan Current plan remains appropriate    Co-evaluation              AM-PAC PT "6 Clicks" Mobility   Outcome Measure  Help needed turning from your back to your side while in a flat bed without using bedrails?: Total Help needed moving from lying on your back to sitting  on the side of a flat bed without using bedrails?: Total Help needed moving to and from a bed to a chair (including a wheelchair)?: Total Help needed standing up from a chair using your arms (e.g., wheelchair or bedside chair)?: Total Help needed to walk in hospital room?: Total Help needed climbing 3-5 steps with a railing? : Total 6 Click Score: 6    End of Session   Activity Tolerance: Patient limited by pain Patient left: in bed;with call bell/phone within reach;with bed alarm set         Time: 2527-1292 PT Time Calculation (min) (ACUTE ONLY): 21 min  Charges:  $Therapeutic Activity: 8-22 mins                       Weston Anna, [place] Pager: [phone] Office: [phone]

## 2018-11-20 NOTE — Progress Notes (Signed)
PROGRESS NOTE    Emily Hughes  IHK:[id number] DOB: 05-17-1941 DOA: 11/08/2018 PCP: Susy Frizzle, MD   Brief Narrative:  HPI per Dr. Toy Baker on 11/08/2018  Patient is a 78 year old African-American female with a past medical history significant for but not limited to diabetes mellitus type 2, CKD stage IV, hypothyroidism, CAD, GERD, hypertension, hyperlipidemia, history of seizure disorder, ? Atrial fibrillation, long with comorbidities who presented to the emergency room with a 3-day history of pain and swelling in her buttocks along with a fever of 102.  Family reportedly gave her some Tylenol that helped her improve her temperature down to 99 but she is found to have elevated blood pressures for last few days when in the 400s.  She has not been taking her insulin for last 2 days and has not been checking her glucose regularly.  On admission she stated that the left buttock pain lasted for last few days and was severe 10 out of 10 and reportedly was a boil that then progressed.  She was worked up and admitted for sepsis secondary to abscess of the left buttock as well as DKA.  She was placed in the stepdown unit and was placed on glucose stabilizer and has improvement in her blood sugars.  She was incidentally noted to be in atrial fibrillation/A Flutter which then converted to NSR a few days ago.  Cardiology was consulted for preoperative clearance and patient was taken for incision and drainage of her abscess on her left buttock and she is POD2. DKA improved and she was transitioned to Long Acting Insulin even though Her CO2 is not >20 given that she has chronic Metabolic Acidosis 2/2 to her Renal Disease and is on Sodium Bicarbonate. She tolerated her Diet without issues but complained of some Abdominal Pain.   Liver function tests were elevated and right upper quadrant ultrasound was done and showed possible acute cholecystitis.  I discussed with general surgery Dr. Jens Som  who recommended obtaining a HIDA scan however HIDA scan with EF is not able to be done at this time and if EF is needed to be done will come will be on Tuesday per nursing. So will obtain a HIDA scan without the EF currently.  And this HIDA scan was normal hepatobiliary scan so Dr. Windle Guard was less inclined for acute cholecystitis and felt that her LFTs being elevated could be reactionary.  We will continue to monitor closely and antibiotics have been de-escalated to just IV cefepime Flagyl for now and will continue to de-escalate accordingly.  Echocardiogram was done yesterday.  On 11/12/2018 the patient was lethargic and slower to respond.  Obtained an ABG which showed some mild hypoxemia and ammonia level was 18.  Head CT was done and showed no acute intracranial findings and mild atrophy and white matter microvascular disease.  WBC is slowly trending up so repeat cultures were ordered including a chest x-ray, blood cultures, urinalysis.  Chest x-ray showed minimal bibasilar segment atelectasis.  Patient was complaining of left foot pain so foot x-ray was also obtained findings.  Patient's somnolence does not improve will obtain a neurologic consult in the a.m.  Repeat urinalysis showed cloudy urine and small hemoglobin along with moderate leukocytes, few bacteria and 11-20 BCs.  She was on IV antibiotics.   11/13/2018 remained somnolent (received 10 mg of po Hydrocodone this AM) so narcotics have been adjusted and IV morphine has been discontinued along with p.o. hydrocodone.  We will continue to monitor and  white blood cell count is now trending down but in general surgery is recommending hydrotherapy but no further surgical intervention.  Today 11/14/2018 was still a little lethargic but improving.  Patient started hydrotherapy and antibiotics have been further adjusted from IV cefepime to IV Unasyn.  White blood cell count remains somewhat elevated but could be secondary to pain.  We will continue  monitor patient mental status given her changes in her opiate regimen and may need to adjust further.     Assessment & Plan:   Principal Problem:   Sepsis ([place]) Active Problems:   Abscess of left buttock   DKA (diabetic ketoacidoses) (HCC)   Acute renal failure superimposed on stage 4 chronic kidney disease (HCC)   Essential hypertension   CAD (coronary artery disease)   GERD (gastroesophageal reflux disease)   Chronic kidney disease (CKD), stage IV (severe) (HCC)   Atrial fibrillation with RVR (HCC)   Elevated troponin   Abnormal LFTs   Acute metabolic encephalopathy   Cellulitis and abscess of buttock   Acute urinary retention  1 sepsis secondary to left buttocks/gluteal abscess status post incision and drainage of abscess POD #11. Patient was admitted meeting sepsis criteria with fever, leukocytosis, tachycardia(new onset atrial fibrillation).  Sepsis protocol was initiated in the ED.  Sepsis physiology improving as patient currently afebrile.  Tachycardia improving.  Patient still with a leukocytosis which is fluctuating and currently at 22.6 from 24.6.  Blood cultures with no growth to date x5 days.  Urine culture is negative.  MRSA PCR negative.  COVID-19 negative.  Cultures from left buttocks abscess positive for E. coli which is pansensitive.  Patient was seen in consultation by general surgery and underwent incision and drainage of abscess with cultures sent.  Procalcitonin level was 6.89 and trended down to 4.82.  Lactic acid level trended down from 2.9-1.0.  Patient initially placed on IV vancomycin, IV cefepime and IV metronidazole.  Antibiotics narrowed down patient currently on IV Unasyn.  Patient noted to have ongoing leukocytosis repeat urinalysis showed moderate leukocytes, nitrite negative, few bacteria, 11-20 WBCs.  Repeat chest x-ray done 11/12/2018- for any acute infiltrate however consistent with atelectasis.  Patient currently receiving hydrotherapy which was started on  11/14/2018.  Patient also on twice daily wet-to-dry dressing changes per general surgery.  Status post 10 days of IV antibiotics post incision and drainage.  Per general surgery.  Follow.   2.  Diabetes mellitus type 2/DKA Patient had presented in DKA felt secondary to problem #1.  Hemoglobin A1c 8.4.  On admission patient noted to have a beta hydroxybutyric acid of 2.29.  Patient was placed on the glucose stabilizer and subsequently transition to subcutaneous Lantus at 15 units daily as well as sliding scale insulin and meal coverage NovoLog of 6 units 3 times daily with meals.  Patient with improving oral intake.   CBG of 189 this morning.  Increase Lantus to 18 units daily.  Place on NovoLog 3 units meal coverage insulin.  Continue sliding scale insulin.  Follow.    3.  New onset/Transient atrial fibrillation/atrial flutter with variable block status post conversion to normal sinus rhythm with transient third-degree AV block. Felt likely in the setting of sepsis secondary to problem #1.  Patient converted to normal sinus rhythm with a short episode of third-degree AV block overnight while asleep thought possibly secondary to AV nodal blocking drugs and sleep apnea.  Patient initially on a Cardizem drip and subsequently transitioned to oral Cardizem which has subsequently  been discontinued.  Patient seen in consultation by cardiology who feels no need for anticoagulation at this time given short duration and need for surgical intervention.  Continue current regimen of Toprol-XL 50 mg daily.  Appreciate cardiology input and recommendations.  4.  Nausea Patient with complaints of nausea yesterday morning which has since resolved. Patient also with some complaints of constipation not sure the last time she had bowel movement.  Continue daily Dulcolax suppositories.  Patient given soapsuds enema x1 with no bowel movement.  Patient noted to have multiple loose stools on 11/17/2018.  Dulcolax suppositories and  MiraLAX have been discontinued.  Nausea improved.  Follow.  5.  Hypertension Blood pressure improved.  Continue Toprol-XL, clonidine, Norvasc.  Follow.    6.  Elevated troponin Felt secondary to demand ischemia in the setting of sepsis, elevated lactic acid and acute on chronic kidney failure.  EKG with question of flutter versus sinus tach with PACs.  Repeat EKG showed a normal sinus rhythm and evidence of LVH with no signs of ischemia noted.  Patient seen by cardiology who felt no further ischemic work-up needed at this time.  Continue aspirin 81 mg daily, Toprol-XL, statin.  Follow.  7.  Acute on chronic kidney disease stage IV/chronic metabolic acidosis Felt secondary to prerenal azotemia in the setting of sepsis.  Patient on admission noted to have a creatinine of 4.76.  Patient placed on IV fluids which have subsequently been discontinued.  Renal function improved and currently close to baseline.  Creatinine at 2.6 1.  Continue calcitriol.  Acidosis improved with increased bicarb tablets of 1300 mg p.o. 3 times daily.  Outpatient follow-up with nephrology.   8.  Hypothyroidism TSH of 1.815 on 11/09/2018.  Synthroid.   9.  Coronary artery disease Currently stable.  Patient denies any acute chest pain.  Patient was seen by cardiology during the hospitalization secondary to new onset atrial flutter with variable block.  Continue current cardiac medications of aspirin, Toprol-XL, statin, Norvasc.  Outpatient follow-up with cardiology.  10.  Hyponatremia Improved.  11.  Hypermagnesemia Monitor.  Magnesium level at 2.9.  Follow.  12.  Acute encephalopathy/somnolence and lethargy Questionable etiology.  Likely medication induced.  Patient noted to be somnolent and lethargic however improving daily, patient answering questions appropriately and following commands.  Likely close to baseline.  Head CT done was negative for any acute abnormalities.  ABG with a pH of 7.383/PCO2 of 27/PO2 of 77.   Chest x-ray negative, blood cultures negative to date, urine cultures negative.  Patient being treated for left gluteal abscess on empiric IV antibiotics.  Continue to limit sedating medications.  IV morphine has been discontinued.  Patient now on oxycodone.  Patient seems to be slowly improving following commands appropriately and answering some questions.  EEG done consistent with generalized nonspecific cerebral dysfunction/encephalopathy.  No seizures or seizure predisposition recorded on the study.  Monitor for now.  13.  Morbid obesity Weight loss and dietary counseling given.  14.  History of gout Colchicine on hold.  Continue allopurinol.  15.  Abnormal LFTs Likely reactive.  LFTs trended down.  Acute hepatitis panel was negative.  Right upper quadrant ultrasound showed gallbladder sludge and positive sonographic Murphy sign however no biliary duct dilatation.  HIDA scan which was done was normal.  Dr. Alfredia Ferguson discussed the case with general surgery who recommended no further intervention at this time and to continue to trend hepatic function.  16.  Normocytic anemia/anemia of chronic kidney disease Likely dilutional  and postoperative drop.  Hemoglobin was 11.4 and dropped to 7.7 and now at 7.3.  Patient with no overt bleeding. Anemia panel consistent with anemia of chronic disease.  Follow H&H.  Transfusion threshold hemoglobin less than 7.  17.  Leukocytosis Likely secondary to problem #1.  WBC was fluctuating.  Patient afebrile.  Patient has been pancultured with blood cultures with no growth to date, urine culture is negative, chest x-ray with no acute infiltrate. COVID-19 negative. Patient with no respiratory symptoms.  Patient with left gluteal abscess that has been I&D and cultures positive for E. coli.  Status post 10 days IV antibiotics post incision and drainage.  No further antibiotics needed at this time.    18. gastroesophageal reflux disease Continue PPI.  19.  Loose  stools Patient noted to have loose stools per RN.  Discontinued Dulcolax and MiraLAX.  Loose stools improving.  Patient received a dose of Imodium.  Follow.  20.  Hyperkalemia Resolved with resumption of home regimen of Veltassa.  Continue daily Veltassa.  21.  urinary retention Foley catheter placed back in due to urinary retention.  Continue Flomax.  Likely voiding trial in the next 1 to 2 days. Follow.    DVT prophylaxis: SCDs Code Status: Full Family Communication: Updated patient.  No family at bedside. Disposition Plan: Likely need SNF on discharge.    Consultants:   Cardiology: Dr. Meda Coffee 11/09/2018  General surgery: Dr. Marlou Starks III 11/08/2018  Procedures:   CT head 11/12/2018  CT pelvis 11/08/2018  Chest x-ray 11/08/2018, 11/12/2018, 11/13/2018, 11/15/2018  Plain films of the left foot 11/12/2018  Limited echocardiogram 11/11/2018  Right upper quadrant ultrasound 11/11/2018  Incision and drainage of left buttocks abscess 11/09/2018 per Dr. Ninfa Linden  EEG 11/15/2018  Antimicrobials:   Oral Flagyl 11/13/2018>>>> 11/14/2018  IV Unasyn 11/14/2018>>>> 11/19/2018  IV cefepime 11/08/2018>>>> 11/14/2018  IV Flagyl 11/08/2018>>>>> 11/13/2018  IV vancomycin 11/08/2018>>>> 11/11/2018   Subjective: Patient watching television.  Patient ate less than 50% of her lunch.  Wound pain improving.  No chest pain.  No shortness of breath.   Objective: Vitals:   11/19/18 1416 11/19/18 2040 11/20/18 0423 11/20/18 1349  BP: 116/62 120/69 (!) 120/59 119/62  Pulse: 80 93 84 85  Resp: 20 18 16 20   Temp: 98 F (36.7 C) 98.9 F (37.2 C) 98.3 F (36.8 C) 99.1 F (37.3 C)  TempSrc: Oral Oral Oral Oral  SpO2: 98% 100% 97% 94%  Weight:      Height:        Intake/Output Summary (Last 24 hours) at 11/20/2018 1428 Last data filed at 11/20/2018 0508 Gross per 24 hour  Intake --  Output 1850 ml  Net -1850 ml   Filed Weights   11/13/18 0400 11/17/18 0837 11/19/18 0512  Weight: 113 kg 115 kg 114.5 kg     Examination:  General exam: NAD Respiratory system: Clear to auscultation bilaterally anterior lung fields.  No wheezes, no crackles, no rhonchi.  Normal respiratory effort.  Cardiovascular system: Regular rate rhythm no murmurs rubs or gallops.  No JVD.  No lower extremity edema Gastrointestinal system: Abdomen is soft, nontender, nondistended, positive bowel sounds.  No rebound.  No guarding.   Central nervous system: Alert. No focal neurological deficits.  Moving extremities spontaneously. Extremities: Symmetric 5 x 5 power. Skin: Left gluteal wound with some fibrinous area with some necrotic edges with minimal drainage. Psychiatry: Judgement and insight appear fair. Mood & affect appropriate.     Data Reviewed: I have personally  reviewed following labs and imaging studies  CBC: Recent Labs  Lab 11/16/18 0230 11/16/18 1412 11/17/18 0645 11/18/18 0629 11/19/18 0525 11/20/18 0459  WBC 19.3*  --  22.1* 17.0* 15.9* 13.5*  NEUTROABS 14.4*  --  17.7* 12.9* 11.7* 9.8*  HGB 7.7* 8.4* 8.9* 8.7* 8.2* 7.3*  HCT 25.0* 26.2* 27.8* 28.4* 26.3* 23.3*  MCV 85.0  --  82.7 86.3 87.4 86.0  PLT 336  --  391 416* 408* 875   Basic Metabolic Panel: Recent Labs  Lab 11/14/18 0245 11/15/18 0231 11/16/18 0230 11/17/18 0645 11/18/18 0629 11/19/18 0525 11/20/18 0459  NA 138 142 142 137 135 136 134*  K 4.6 4.9 4.5 5.7* 4.5 4.8 5.1  CL 111 117* 118* 110 108 109 107  CO2 17* 17* 19* 16* 19* 18* 21*  GLUCOSE 102* 76 107* 178* 160* 140* 215*  BUN 98* 95* 92* 84* 80* 72* 68*  CREATININE 2.70* 2.71* 2.73* 2.53* 2.46* 2.44* 2.61*  CALCIUM 8.5* 8.6* 8.4* 8.8* 8.8* 8.8* 8.4*  MG 2.5* 2.7* 2.9*  --   --   --   --   PHOS 4.2 3.9 4.1 4.2  --   --   --    GFR: Estimated Creatinine Clearance: 22.3 mL/min (A) (by C-G formula based on SCr of 2.61 mg/dL (H)). Liver Function Tests: Recent Labs  Lab 11/14/18 0245 11/15/18 0231 11/16/18 0230 11/17/18 0645  AST 27 26  --   --   ALT 25 22  --    --   ALKPHOS 75 68  --   --   BILITOT 0.6 0.7  --   --   PROT 6.1* 5.9*  --   --   ALBUMIN 2.0* 1.9* 1.7* 2.0*   No results for input(s): LIPASE, AMYLASE in the last 168 hours. No results for input(s): AMMONIA in the last 168 hours. Coagulation Profile: No results for input(s): INR, PROTIME in the last 168 hours. Cardiac Enzymes: No results for input(s): CKTOTAL, CKMB, CKMBINDEX, TROPONINI in the last 168 hours. BNP (last 3 results) No results for input(s): PROBNP in the last 8760 hours. HbA1C: No results for input(s): HGBA1C in the last 72 hours. CBG: Recent Labs  Lab 11/19/18 1136 11/19/18 1642 11/19/18 2036 11/20/18 0732 11/20/18 1138  GLUCAP 195* 226* 221* 189* 276*   Lipid Profile: No results for input(s): CHOL, HDL, LDLCALC, TRIG, CHOLHDL, LDLDIRECT in the last 72 hours. Thyroid Function Tests: No results for input(s): TSH, T4TOTAL, FREET4, T3FREE, THYROIDAB in the last 72 hours. Anemia Panel: No results for input(s): VITAMINB12, FOLATE, FERRITIN, TIBC, IRON, RETICCTPCT in the last 72 hours. Sepsis Labs: Recent Labs  Lab 11/14/18 0245  PROCALCITON 4.82    No results found for this or any previous visit (from the past 240 hour(s)).       Radiology Studies: No results found.      Scheduled Meds:  allopurinol  300 mg Oral Daily   amLODipine  2.5 mg Oral Daily   aspirin EC  81 mg Oral Daily   calcitRIOL  0.25 mcg Oral Daily   Chlorhexidine Gluconate Cloth  6 each Topical Daily   cloNIDine  0.1 mg Oral BID   feeding supplement (ENSURE ENLIVE)  237 mL Oral BID BM   insulin aspart  0-15 Units Subcutaneous TID WC   insulin glargine  15 Units Subcutaneous Daily   levothyroxine  50 mcg Oral Q0600   mouth rinse  15 mL Mouth Rinse BID   metoprolol succinate  50  mg Oral Daily   nutrition supplement (JUVEN)  1 packet Oral BID BM   pantoprazole  40 mg Oral Q0600   patiromer  8.4 g Oral Daily   pravastatin  80 mg Oral QHS   sodium  bicarbonate  1,300 mg Oral TID   tamsulosin  0.4 mg Oral QPC breakfast   Continuous Infusions:    LOS: 12 days    Time spent: 40 minutes    Irine Seal, MD [facility]  If 7PM-7AM, please contact night-coverage [email or web address] 11/20/2018, 2:28 PM

## 2018-11-20 NOTE — Progress Notes (Signed)
HYDROTHERAPY TREATMENT      11/20/18 1300  Subjective Assessment  Subjective "if y'all don't bother me, I won't bother you" (jokingly)  Patient and Family Stated Goals less pain  Date of Onset  (abscess present on admission)  Prior Treatments s/p I&D 5/7  Evaluation and Treatment  Evaluation and Treatment Procedures Explained to Patient/Family Yes  Evaluation and Treatment Procedures agreed to  Wound / Incision (Open or Dehisced) 11/13/18 Incision - Open Buttocks Left L buttocks wound s/p I&D by Surgery 11/09/18. **HYDRO/PT**  Date First Assessed/Time First Assessed: 11/13/18 1500   Wound Type: Incision - Open  Location: Buttocks  Location Orientation: Left  Wound Description (Comments): L buttocks wound s/p I&D by Surgery 11/09/18. **HYDRO/PT**  Dressing Type Barrier Film (skin prep);Gauze ;Moist to dry;ABD;Impregnated gauze (petrolatum) (Kerlix packing)  Dressing Changed Changed  Dressing Status Old drainage  Dressing Change Frequency Twice a day  Site / Wound Assessment Black;Brown;Yellow;Pink  % Wound base Red or Granulating 65%  % Wound base Yellow/Fibrinous Exudate 20%  % Wound base Black/Eschar 15%  Peri-wound Assessment Excoriated  Wound Length (cm) 8 cm  Wound Width (cm) 5 cm  Wound Depth (cm) 5 cm  Wound Volume (cm^3) 200 cm^3  Wound Surface Area (cm^2) 40 cm^2  Margins Unattached edges (unapproximated)  Drainage Amount Moderate  Drainage Description Serosanguineous  Treatment Debridement (Selective);Hydrotherapy (Pulse lavage);Packing (Saline gauze)  Hydrotherapy  Pulsed lavage therapy - wound location L medial buttocks  Pulsed Lavage with Suction (psi) 8 psi  Pulsed Lavage with Suction - Normal Saline Used 1000 mL  Pulsed Lavage Tip Tip with splash shield  Selective Debridement  Selective Debridement - Location L buttock  Selective Debridement - Tools Used Forceps;Scissors  Selective Debridement - Tissue Removed brown/black tissue and yellow slough  Wound Therapy  - Assess/Plan/Recommendations  Wound Therapy - Clinical Statement 78 yo female admitted with L gluteal/buttock abscess. S/P I&D 11/09/18.   Wound Therapy - Functional Problem List limited mobility  Factors Delaying/Impairing Wound Healing Diabetes Mellitus;Multiple medical problems;Immobility;Incontinence  Hydrotherapy Plan Debridement;Dressing change;Patient/family education;Pulsatile lavage with suction  Wound Therapy - Frequency 6X / week  Wound Therapy - Current Recommendations Case manager/social work  Wound Therapy - Follow Up Recommendations Skilled nursing facility  Wound Plan Per general surgery PA on 11/17/18, plan is for hydrotherapy MWF. Spoke with Legrand Como, [place] on 5/18 to arrange a time for them to come see wound at 11:00, no one showed-made RN aware. Limited debridement due to pt c/o pain-she was premedicated.  Wound Therapy Goals - Improve the function of patient's integumentary system by progressing the wound(s) through the phases of wound healing by:  Decrease Necrotic Tissue to 20%  Decrease Necrotic Tissue - Progress Progressing toward goal  Increase Granulation Tissue to 80%  Increase Granulation Tissue - Progress Progressing toward goal  Improve Drainage Characteristics Min  Improve Drainage Characteristics - Progress Progressing toward goal  Goals/treatment plan/discharge plan were made with and agreed upon by patient/family Yes  Time For Goal Achievement 2 weeks  Wound Therapy - Potential for Goals Good    Weston Anna, PT Acute Rehabilitation Services Pager: [phone] Office: [phone]

## 2018-11-20 NOTE — Care Management Important Message (Signed)
Important Message  Patient Details IM Letter given to Servando Snare SW to present to the Patient Name: Emily Hughes MRN: [id number] Date of Birth: 1941-01-15   Medicare Important Message Given:  Yes    [name] 11/20/2018, 10:11 AM

## 2018-11-21 LAB — GLUCOSE, CAPILLARY
Glucose-Capillary: 208 mg/dL — ABNORMAL HIGH (ref 70–99)
Glucose-Capillary: 209 mg/dL — ABNORMAL HIGH (ref 70–99)
Glucose-Capillary: 239 mg/dL — ABNORMAL HIGH (ref 70–99)
Glucose-Capillary: 255 mg/dL — ABNORMAL HIGH (ref 70–99)

## 2018-11-21 LAB — BASIC METABOLIC PANEL
Anion gap: 8 (ref 5–15)
BUN: 69 mg/dL — ABNORMAL HIGH (ref 8–23)
CO2: 19 mmol/L — ABNORMAL LOW (ref 22–32)
Calcium: 8.7 mg/dL — ABNORMAL LOW (ref 8.9–10.3)
Chloride: 107 mmol/L (ref 98–111)
Creatinine, Ser: 2.86 mg/dL — ABNORMAL HIGH (ref 0.44–1.00)
GFR calc Af Amer: 18 mL/min — ABNORMAL LOW (ref >60)
GFR calc non Af Amer: 15 mL/min — ABNORMAL LOW (ref >60)
Glucose, Bld: 200 mg/dL — ABNORMAL HIGH (ref 70–99)
Potassium: 5.1 mmol/L (ref 3.5–5.1)
Sodium: 134 mmol/L — ABNORMAL LOW (ref 135–145)

## 2018-11-21 LAB — CBC WITH DIFFERENTIAL/PLATELET
Abs Immature Granulocytes: 0.21 10*3/uL — ABNORMAL HIGH (ref 0.00–0.07)
Basophils Absolute: 0 10*3/uL (ref 0.0–0.1)
Basophils Relative: 0 %
Eosinophils Absolute: 0.2 10*3/uL (ref 0.0–0.5)
Eosinophils Relative: 1 %
HCT: 24.7 % — ABNORMAL LOW (ref 36.0–46.0)
Hemoglobin: 7.7 g/dL — ABNORMAL LOW (ref 12.0–15.0)
Immature Granulocytes: 2 %
Lymphocytes Relative: 15 %
Lymphs Abs: 2 10*3/uL (ref 0.7–4.0)
MCH: 27.5 pg (ref 26.0–34.0)
MCHC: 31.2 g/dL (ref 30.0–36.0)
MCV: 88.2 fL (ref 80.0–100.0)
Monocytes Absolute: 0.9 10*3/uL (ref 0.1–1.0)
Monocytes Relative: 7 %
Neutro Abs: 9.8 10*3/uL — ABNORMAL HIGH (ref 1.7–7.7)
Neutrophils Relative %: 75 %
Platelets: 402 10*3/uL — ABNORMAL HIGH (ref 150–400)
RBC: 2.8 MIL/uL — ABNORMAL LOW (ref 3.87–5.11)
RDW: 15.9 % — ABNORMAL HIGH (ref 11.5–15.5)
WBC: 13.1 10*3/uL — ABNORMAL HIGH (ref 4.0–10.5)
nRBC: 0 % (ref 0.0–0.2)

## 2018-11-21 MED ORDER — FUROSEMIDE 40 MG PO TABS
40.0000 mg | ORAL_TABLET | Freq: Two times a day (BID) | ORAL | Status: DC
  Administered 2018-11-22 – 2018-11-29 (×15): 40 mg via ORAL
  Filled 2018-11-21 (×15): qty 1

## 2018-11-21 NOTE — Progress Notes (Signed)
PT NOTE    Pt is already on PT caseload for hydrotherapy. Frequency had been updated to 3x/week by general surgery PA Claiborne Billings)  on 5/15. New order received today from Will J. (PA) to change frequency back to daily (6x/week). Will resume hydrotherapy on Wed 5/20. Thanks.    Weston Anna, PT Acute Rehabilitation Services Pager: [phone] Office: [phone]

## 2018-11-21 NOTE — Progress Notes (Signed)
PROGRESS NOTE    Emily Hughes  XVQ:[id number] DOB: 1941-03-03 DOA: 11/08/2018 PCP: Susy Frizzle, MD   Brief Narrative:  HPI per Dr. Toy Baker on 11/08/2018  Patient is a 78 year old African-American female with a past medical history significant for but not limited to diabetes mellitus type 2, CKD stage IV, hypothyroidism, CAD, GERD, hypertension, hyperlipidemia, history of seizure disorder, ? Atrial fibrillation, long with comorbidities who presented to the emergency room with a 3-day history of pain and swelling in her buttocks along with a fever of 102.  Family reportedly gave her some Tylenol that helped her improve her temperature down to 99 but she is found to have elevated blood pressures for last few days when in the 400s.  She has not been taking her insulin for last 2 days and has not been checking her glucose regularly.  On admission she stated that the left buttock pain lasted for last few days and was severe 10 out of 10 and reportedly was a boil that then progressed.  She was worked up and admitted for sepsis secondary to abscess of the left buttock as well as DKA.  She was placed in the stepdown unit and was placed on glucose stabilizer and has improvement in her blood sugars.  She was incidentally noted to be in atrial fibrillation/A Flutter which then converted to NSR a few days ago.  Cardiology was consulted for preoperative clearance and patient was taken for incision and drainage of her abscess on her left buttock and she is POD2. DKA improved and she was transitioned to Long Acting Insulin even though Her CO2 is not >20 given that she has chronic Metabolic Acidosis 2/2 to her Renal Disease and is on Sodium Bicarbonate. She tolerated her Diet without issues but complained of some Abdominal Pain.   Liver function tests were elevated and right upper quadrant ultrasound was done and showed possible acute cholecystitis.  I discussed with general surgery Dr. Jens Som  who recommended obtaining a HIDA scan however HIDA scan with EF is not able to be done at this time and if EF is needed to be done will come will be on Tuesday per nursing. So will obtain a HIDA scan without the EF currently.  And this HIDA scan was normal hepatobiliary scan so Dr. Windle Guard was less inclined for acute cholecystitis and felt that her LFTs being elevated could be reactionary.  We will continue to monitor closely and antibiotics have been de-escalated to just IV cefepime Flagyl for now and will continue to de-escalate accordingly.  Echocardiogram was done yesterday.  On 11/12/2018 the patient was lethargic and slower to respond.  Obtained an ABG which showed some mild hypoxemia and ammonia level was 18.  Head CT was done and showed no acute intracranial findings and mild atrophy and white matter microvascular disease.  WBC is slowly trending up so repeat cultures were ordered including a chest x-ray, blood cultures, urinalysis.  Chest x-ray showed minimal bibasilar segment atelectasis.  Patient was complaining of left foot pain so foot x-ray was also obtained findings.  Patient's somnolence does not improve will obtain a neurologic consult in the a.m.  Repeat urinalysis showed cloudy urine and small hemoglobin along with moderate leukocytes, few bacteria and 11-20 BCs.  She was on IV antibiotics.   11/13/2018 remained somnolent (received 10 mg of po Hydrocodone this AM) so narcotics have been adjusted and IV morphine has been discontinued along with p.o. hydrocodone.  We will continue to monitor and  white blood cell count is now trending down but in general surgery is recommending hydrotherapy but no further surgical intervention.  Today 11/14/2018 was still a little lethargic but improving.  Patient started hydrotherapy and antibiotics have been further adjusted from IV cefepime to IV Unasyn.  White blood cell count remains somewhat elevated but could be secondary to pain.  We will continue  monitor patient mental status given her changes in her opiate regimen and may need to adjust further.     Assessment & Plan:   Principal Problem:   Sepsis ([place]) Active Problems:   Abscess of left buttock   DKA (diabetic ketoacidoses) (HCC)   Acute renal failure superimposed on stage 4 chronic kidney disease (HCC)   Essential hypertension   CAD (coronary artery disease)   GERD (gastroesophageal reflux disease)   Chronic kidney disease (CKD), stage IV (severe) (HCC)   Atrial fibrillation with RVR (HCC)   Elevated troponin   Abnormal LFTs   Acute metabolic encephalopathy   Cellulitis and abscess of buttock   Acute urinary retention  1 sepsis secondary to left buttocks/gluteal abscess status post incision and drainage of abscess POD #11. Patient was admitted meeting sepsis criteria with fever, leukocytosis, tachycardia(new onset atrial fibrillation).  Sepsis protocol was initiated in the ED.  Sepsis physiology improving as patient currently afebrile.  Tachycardia improving.  Patient still with a leukocytosis which is fluctuating and currently at 22.6 from 24.6.  Blood cultures with no growth to date x5 days.  Urine culture is negative.  MRSA PCR negative.  COVID-19 negative.  Cultures from left buttocks abscess positive for E. coli which is pansensitive.  Patient was seen in consultation by general surgery and underwent incision and drainage of abscess with cultures sent.  Procalcitonin level was 6.89 and trended down to 4.82.  Lactic acid level trended down from 2.9-1.0.  Patient initially placed on IV vancomycin, IV cefepime and IV metronidazole.  Antibiotics narrowed down patient currently on IV Unasyn.  Patient noted to have ongoing leukocytosis repeat urinalysis showed moderate leukocytes, nitrite negative, few bacteria, 11-20 WBCs.  Repeat chest x-ray done 11/12/2018- for any acute infiltrate however consistent with atelectasis.  Patient currently receiving hydrotherapy which was started on  11/14/2018.  Patient also on twice daily wet-to-dry dressing changes per general surgery.  Status post 10 days of IV antibiotics post incision and drainage.  Per general surgery.  Follow.   2.  Diabetes mellitus type 2/DKA Patient had presented in DKA felt secondary to problem #1.  Hemoglobin A1c 8.4.  On admission patient noted to have a beta hydroxybutyric acid of 2.29.  Patient was placed on the glucose stabilizer and subsequently transition to subcutaneous Lantus at 15 units daily as well as sliding scale insulin and meal coverage NovoLog of 6 units 3 times daily with meals.  Patient with improving oral intake.   CBG of 208 this morning.  Continue Lantus 18 units daily.  Continue NovoLog 3 units 3 times daily with meals for meal coverage.  Continue sliding scale insulin.  3.  New onset/Transient atrial fibrillation/atrial flutter with variable block status post conversion to normal sinus rhythm with transient third-degree AV block. Felt likely in the setting of sepsis secondary to problem #1.  Patient converted to normal sinus rhythm with a short episode of third-degree AV block overnight while asleep thought possibly secondary to AV nodal blocking drugs and sleep apnea.  Patient initially on a Cardizem drip and subsequently transitioned to oral Cardizem which has subsequently been  discontinued.  Patient seen in consultation by cardiology who feels no need for anticoagulation at this time given short duration and need for surgical intervention.  Continue current regimen of Toprol-XL 50 mg daily.  Appreciate cardiology input and recommendations.  4.  Nausea Patient with complaints of nausea which has since resolved. Patient also with some complaints of constipation not sure the last time she had bowel movement.  Daily Dulcolax suppositories have been discontinued as patient now with loose stools.  Nausea resolved.  5.  Hypertension Stable.  Continue Toprol-XL, clonidine, Norvasc.    6.  Elevated  troponin Felt secondary to demand ischemia in the setting of sepsis, elevated lactic acid and acute on chronic kidney failure.  EKG with question of flutter versus sinus tach with PACs.  Repeat EKG showed a normal sinus rhythm and evidence of LVH with no signs of ischemia noted.  Patient seen by cardiology who felt no further ischemic work-up needed at this time.  Continue aspirin 81 mg daily, Toprol-XL, statin.  Follow.  7.  Acute on chronic kidney disease stage IV/chronic metabolic acidosis Felt secondary to prerenal azotemia in the setting of sepsis.  Patient on admission noted to have a creatinine of 4.76.  Patient placed on IV fluids which have subsequently been discontinued.  Renal function improved and currently close to baseline.  Creatinine at 2.86.  Continue calcitriol.  Acidosis improved with increased bicarb tablets of 1300 mg p.o. 3 times daily.  Patient with some lower extremity edema.  Placed on Lasix 40 mg orally twice daily.  Patient was on Lasix 80 mg in the morning and 40 mg at bedtime as her home medication dose.  Outpatient follow-up with nephrology.   8.  Hypothyroidism TSH of 1.815 on 11/09/2018.  Synthroid.   9.  Coronary artery disease Currently stable.  Patient denies any acute chest pain.  Patient was seen by cardiology during the hospitalization secondary to new onset atrial flutter with variable block.  Continue current cardiac medications of aspirin, Toprol-XL, statin, Norvasc.  Outpatient follow-up with cardiology.  10.  Hyponatremia Stable at 134.    11.  Hypermagnesemia Monitor.  Magnesium level at 2.9.  Follow.  12.  Acute encephalopathy/somnolence and lethargy Questionable etiology.  Likely medication induced.  Patient noted to be somnolent and lethargic however improving daily, patient answering questions appropriately and following commands.  Likely close to baseline.  Head CT done was negative for any acute abnormalities.  ABG with a pH of 7.383/PCO2 of 27/PO2  of 77.  Chest x-ray negative, blood cultures negative to date, urine cultures negative.  Patient being treated for left gluteal abscess on empiric IV antibiotics.  Continue to limit sedating medications.  IV morphine has been discontinued.  Patient now on oxycodone.  Patient seems to be slowly improving following commands appropriately and answering some questions.  EEG done consistent with generalized nonspecific cerebral dysfunction/encephalopathy.  No seizures or seizure predisposition recorded on the study.  Monitor for now.  13.  Morbid obesity Weight loss and dietary counseling given.  14.  History of gout Continue allopurinol.   15.  Abnormal LFTs Likely reactive.  LFTs trended down.  Acute hepatitis panel was negative.  Right upper quadrant ultrasound showed gallbladder sludge and positive sonographic Murphy sign however no biliary duct dilatation.  HIDA scan which was done was normal.  Dr. Alfredia Ferguson discussed the case with general surgery who recommended no further intervention at this time and to continue to trend hepatic function.  16.  Normocytic  anemia/anemia of chronic kidney disease Likely dilutional and postoperative drop.  Hemoglobin was 11.4 and dropped to 7.7 and now at 7.7.  Patient with no overt bleeding. Anemia panel consistent with anemia of chronic disease.  Follow H&H.  Transfusion threshold hemoglobin less than 7.  17.  Leukocytosis Likely secondary to problem #1.  WBC was fluctuating.  Patient afebrile.  Patient has been pancultured with blood cultures with no growth to date, urine culture is negative, chest x-ray with no acute infiltrate. COVID-19 negative. Patient with no respiratory symptoms.  Patient with left gluteal abscess that has been I&D and cultures positive for E. coli.  Status post 10 days IV antibiotics post incision and drainage.  No further antibiotics needed at this time.    18. gastroesophageal reflux disease Continue PPI.  19.  Loose stools Patient  noted to have loose stools per RN.  Discontinued Dulcolax and MiraLAX.  Loose stools improved.  Status post dose of Imodium.  Patient with rectal pouch which we will discontinue and monitor. Follow.  20.  Hyperkalemia Resolved with resumption of home regimen of Veltassa.  Continue daily Veltassa.  21.  urinary retention Foley catheter placed back in due to urinary retention.  Continue Flomax.  DC Foley catheter.  I and O cath every 6 hours as needed.     DVT prophylaxis: SCDs Code Status: Full Family Communication: Updated patient.  No family at bedside. Disposition Plan: Likely need SNF on discharge when okay with general surgery..    Consultants:   Cardiology: Dr. Meda Coffee 11/09/2018  General surgery: Dr. Marlou Starks III 11/08/2018  Procedures:   CT head 11/12/2018  CT pelvis 11/08/2018  Chest x-ray 11/08/2018, 11/12/2018, 11/13/2018, 11/15/2018  Plain films of the left foot 11/12/2018  Limited echocardiogram 11/11/2018  Right upper quadrant ultrasound 11/11/2018  Incision and drainage of left buttocks abscess 11/09/2018 per Dr. Ninfa Linden  EEG 11/15/2018  Antimicrobials:   Oral Flagyl 11/13/2018>>>> 11/14/2018  IV Unasyn 11/14/2018>>>> 11/19/2018  IV cefepime 11/08/2018>>>> 11/14/2018  IV Flagyl 11/08/2018>>>>> 11/13/2018  IV vancomycin 11/08/2018>>>> 11/11/2018   Subjective: Patient denies any chest pain or shortness of breath.  States oral intake improving.  Wound pain improving.  Poor oral intake.  Objective: Vitals:   11/20/18 0423 11/20/18 1349 11/20/18 2049 11/21/18 0510  BP: (!) 120/59 119/62 (!) 119/97 130/61  Pulse: 84 85 90 79  Resp: 16 20 16 14   Temp: 98.3 F (36.8 C) 99.1 F (37.3 C) 100.2 F (37.9 C) 98.5 F (36.9 C)  TempSrc: Oral Oral Oral Oral  SpO2: 97% 94% 98% 98%  Weight:      Height:        Intake/Output Summary (Last 24 hours) at 11/21/2018 1157 Last data filed at 11/21/2018 0651 Gross per 24 hour  Intake 480 ml  Output 900 ml  Net -420 ml   Filed Weights    11/13/18 0400 11/17/18 0837 11/19/18 0512  Weight: 113 kg 115 kg 114.5 kg    Examination:  General exam: NAD Respiratory system: CTAB anterior lung fields.  No wheezes, no crackles, no rhonchi.  Speaking in full sentences.  Normal respiratory effort.  Cardiovascular system: RRR no murmurs rubs or gallops.  No JVD.  1-2+ bilateral lower extremity edema.   Gastrointestinal system: Abdomen is soft, nontender, nondistended, positive bowel sounds.  No rebound.  No guarding. Central nervous system: Alert. No focal neurological deficits.  Moving extremities spontaneously. Extremities: Symmetric 5 x 5 power. Skin: Left gluteal wound with some fibrinous area with some necrotic  edges with minimal drainage. Psychiatry: Judgement and insight appear fair. Mood & affect appropriate.     Data Reviewed: I have personally reviewed following labs and imaging studies  CBC: Recent Labs  Lab 11/17/18 0645 11/18/18 0629 11/19/18 0525 11/20/18 0459 11/21/18 0817  WBC 22.1* 17.0* 15.9* 13.5* 13.1*  NEUTROABS 17.7* 12.9* 11.7* 9.8* 9.8*  HGB 8.9* 8.7* 8.2* 7.3* 7.7*  HCT 27.8* 28.4* 26.3* 23.3* 24.7*  MCV 82.7 86.3 87.4 86.0 88.2  PLT 391 416* 408* 396 283*   Basic Metabolic Panel: Recent Labs  Lab 11/15/18 0231 11/16/18 0230 11/17/18 0645 11/18/18 0629 11/19/18 0525 11/20/18 0459 11/21/18 0817  NA 142 142 137 135 136 134* 134*  K 4.9 4.5 5.7* 4.5 4.8 5.1 5.1  CL 117* 118* 110 108 109 107 107  CO2 17* 19* 16* 19* 18* 21* 19*  GLUCOSE 76 107* 178* 160* 140* 215* 200*  BUN 95* 92* 84* 80* 72* 68* 69*  CREATININE 2.71* 2.73* 2.53* 2.46* 2.44* 2.61* 2.86*  CALCIUM 8.6* 8.4* 8.8* 8.8* 8.8* 8.4* 8.7*  MG 2.7* 2.9*  --   --   --   --   --   PHOS 3.9 4.1 4.2  --   --   --   --    GFR: Estimated Creatinine Clearance: 20.4 mL/min (A) (by C-G formula based on SCr of 2.86 mg/dL (H)). Liver Function Tests: Recent Labs  Lab 11/15/18 0231 11/16/18 0230 11/17/18 0645  AST 26  --   --   ALT 22   --   --   ALKPHOS 68  --   --   BILITOT 0.7  --   --   PROT 5.9*  --   --   ALBUMIN 1.9* 1.7* 2.0*   No results for input(s): LIPASE, AMYLASE in the last 168 hours. No results for input(s): AMMONIA in the last 168 hours. Coagulation Profile: No results for input(s): INR, PROTIME in the last 168 hours. Cardiac Enzymes: No results for input(s): CKTOTAL, CKMB, CKMBINDEX, TROPONINI in the last 168 hours. BNP (last 3 results) No results for input(s): PROBNP in the last 8760 hours. HbA1C: No results for input(s): HGBA1C in the last 72 hours. CBG: Recent Labs  Lab 11/20/18 1138 11/20/18 1656 11/20/18 2152 11/21/18 0741 11/21/18 1135  GLUCAP 276* 311* 219* 208* 209*   Lipid Profile: No results for input(s): CHOL, HDL, LDLCALC, TRIG, CHOLHDL, LDLDIRECT in the last 72 hours. Thyroid Function Tests: No results for input(s): TSH, T4TOTAL, FREET4, T3FREE, THYROIDAB in the last 72 hours. Anemia Panel: No results for input(s): VITAMINB12, FOLATE, FERRITIN, TIBC, IRON, RETICCTPCT in the last 72 hours. Sepsis Labs: No results for input(s): PROCALCITON, LATICACIDVEN in the last 168 hours.  No results found for this or any previous visit (from the past 240 hour(s)).       Radiology Studies: No results found.      Scheduled Meds:  allopurinol  300 mg Oral Daily   amLODipine  2.5 mg Oral Daily   aspirin EC  81 mg Oral Daily   calcitRIOL  0.25 mcg Oral Daily   Chlorhexidine Gluconate Cloth  6 each Topical Daily   cloNIDine  0.1 mg Oral BID   feeding supplement (ENSURE ENLIVE)  237 mL Oral BID BM   insulin aspart  0-15 Units Subcutaneous TID WC   insulin aspart  3 Units Subcutaneous TID WC   insulin glargine  18 Units Subcutaneous Daily   levothyroxine  50 mcg Oral Q0600   mouth  rinse  15 mL Mouth Rinse BID   metoprolol succinate  50 mg Oral Daily   nutrition supplement (JUVEN)  1 packet Oral BID BM   pantoprazole  40 mg Oral Q0600   patiromer  8.4 g Oral  Daily   pravastatin  80 mg Oral QHS   sodium bicarbonate  1,300 mg Oral TID   tamsulosin  0.4 mg Oral QPC breakfast   Continuous Infusions:    LOS: 13 days    Time spent: 40 minutes    Irine Seal, MD [facility]  If 7PM-7AM, please contact night-coverage [email or web address] 11/21/2018, 11:57 AM

## 2018-11-21 NOTE — Progress Notes (Addendum)
12 Days Post-Op    CC:  DKA/left gluteal abscess  Subjective: Resting in bed, no acute discomfort.   Objective: Vital signs in last 24 hours: Temp:  [98.5 F (36.9 C)-100.2 F (37.9 C)] 98.5 F (36.9 C) (05/19 0510) Pulse Rate:  [79-90] 79 (05/19 0510) Resp:  [14-20] 14 (05/19 0510) BP: (119-130)/(61-97) 130/61 (05/19 0510) SpO2:  [94 %-98 %] 98 % (05/19 0510) Last BM Date: 11/20/18  Intake/Output from previous day: 05/18 0701 - 05/19 0700 In: 480 [P.O.:480] Out: 900 [Urine:900] Intake/Output this shift: No intake/output data recorded.  General appearance: alert, cooperative and no distress  Pt stooling around the flexiseal.  Most of the wound looks pretty good, still a fair amount of necrosis Right lower quadrant of the wound.  Hopefully we can clean this up bedside with daily Hydro Rx, and bedside debridements.    Lab Results:  Recent Labs    11/20/18 0459 11/21/18 0817  WBC 13.5* 13.1*  HGB 7.3* 7.7*  HCT 23.3* 24.7*  PLT 396 402*    BMET Recent Labs    11/20/18 0459 11/21/18 0817  NA 134* 134*  K 5.1 5.1  CL 107 107  CO2 21* 19*  GLUCOSE 215* 200*  BUN 68* 69*  CREATININE 2.61* 2.86*  CALCIUM 8.4* 8.7*   PT/INR No results for input(s): LABPROT, INR in the last 72 hours.  Recent Labs  Lab 11/15/18 0231 11/16/18 0230 11/17/18 0645  AST 26  --   --   ALT 22  --   --   ALKPHOS 68  --   --   BILITOT 0.7  --   --   PROT 5.9*  --   --   ALBUMIN 1.9* 1.7* 2.0*     Lipase     Component Value Date/Time   LIPASE 21 06/24/2014 0816     Medications: . allopurinol  300 mg Oral Daily  . amLODipine  2.5 mg Oral Daily  . aspirin EC  81 mg Oral Daily  . calcitRIOL  0.25 mcg Oral Daily  . Chlorhexidine Gluconate Cloth  6 each Topical Daily  . cloNIDine  0.1 mg Oral BID  . feeding supplement (ENSURE ENLIVE)  237 mL Oral BID BM  . insulin aspart  0-15 Units Subcutaneous TID WC  . insulin aspart  3 Units Subcutaneous TID WC  . insulin glargine  18  Units Subcutaneous Daily  . levothyroxine  50 mcg Oral Q0600  . mouth rinse  15 mL Mouth Rinse BID  . metoprolol succinate  50 mg Oral Daily  . nutrition supplement (JUVEN)  1 packet Oral BID BM  . pantoprazole  40 mg Oral Q0600  . patiromer  8.4 g Oral Daily  . pravastatin  80 mg Oral QHS  . sodium bicarbonate  1,300 mg Oral TID  . tamsulosin  0.4 mg Oral QPC breakfast    Assessment/Plan DKA Type 2 diabetes Chronic on acute;stage IVkidney disease Hypertension Elevated troponin/Atrial fibrillation Hx of palpitations GERD Hx of seizures 06/2015 Morbid obesity BMI 43.5  Sepsis secondary to below Left buttocks/perianal abscess  - Incision and drainage of left buttocks abscess 11/09/2018, DR. Coralie Keens  - Continue local wound care, decrease freq. Of hydrotherapy to M/W/F  - cx grew pan-sensitive E.coli - wound cleaning up well, ok to stop abx from our standpoint Leukocytosis - WBC still elevated at 22.1, afebrile, no hypoTN or tachycardia Diarrhea - hold laxatives and insert rectal tube  FEN:HH/CM diet ID: Rocephin/vanc 5/6;Maxipime/flagyl 5/7- 5/12;  Unasyn 5/12>>  DVT: SCDs/can be on chemical DVT prophylaxis from our standpoint Follow-up: TBD POC: Carlena Sax [phone]  Plan:  Go back to daily hydro and work on the lower right quadrant.     LOS: 13 days    JENNINGS,WILLARD 11/21/2018 [phone]  Agree with above.  Alphonsa Overall, MD, [facility] Surgery Pager: [phone] Office phone:  [phone]

## 2018-11-22 LAB — GLUCOSE, CAPILLARY
Glucose-Capillary: 250 mg/dL — ABNORMAL HIGH (ref 70–99)
Glucose-Capillary: 250 mg/dL — ABNORMAL HIGH (ref 70–99)
Glucose-Capillary: 213 mg/dL — ABNORMAL HIGH (ref 70–99)
Glucose-Capillary: 192 mg/dL — ABNORMAL HIGH (ref 70–99)

## 2018-11-22 LAB — BASIC METABOLIC PANEL
Anion gap: 8 (ref 5–15)
BUN: 78 mg/dL — ABNORMAL HIGH (ref 8–23)
CO2: 21 mmol/L — ABNORMAL LOW (ref 22–32)
Calcium: 8.6 mg/dL — ABNORMAL LOW (ref 8.9–10.3)
Chloride: 102 mmol/L (ref 98–111)
Creatinine, Ser: 2.86 mg/dL — ABNORMAL HIGH (ref 0.44–1.00)
GFR calc Af Amer: 18 mL/min — ABNORMAL LOW (ref >60)
GFR calc non Af Amer: 15 mL/min — ABNORMAL LOW (ref >60)
Glucose, Bld: 258 mg/dL — ABNORMAL HIGH (ref 70–99)
Potassium: 5.5 mmol/L — ABNORMAL HIGH (ref 3.5–5.1)
Sodium: 131 mmol/L — ABNORMAL LOW (ref 135–145)

## 2018-11-22 LAB — CBC
HCT: 21.8 % — ABNORMAL LOW (ref 36.0–46.0)
Hemoglobin: 6.9 g/dL — CL (ref 12.0–15.0)
MCH: 27.3 pg (ref 26.0–34.0)
MCHC: 31.7 g/dL (ref 30.0–36.0)
MCV: 86.2 fL (ref 80.0–100.0)
Platelets: 417 10*3/uL — ABNORMAL HIGH (ref 150–400)
RBC: 2.53 MIL/uL — ABNORMAL LOW (ref 3.87–5.11)
RDW: 16 % — ABNORMAL HIGH (ref 11.5–15.5)
WBC: 9.6 10*3/uL (ref 4.0–10.5)
nRBC: 0 % (ref 0.0–0.2)

## 2018-11-22 LAB — PREPARE RBC (CROSSMATCH)

## 2018-11-22 LAB — ABO/RH: ABO/RH(D): O POS

## 2018-11-22 MED ORDER — INSULIN GLARGINE 100 UNIT/ML ~~LOC~~ SOLN
25.0000 [IU] | Freq: Every day | SUBCUTANEOUS | Status: DC
  Administered 2018-11-23 – 2018-11-28 (×6): 25 [IU] via SUBCUTANEOUS
  Filled 2018-11-22 (×6): qty 0.25

## 2018-11-22 MED ORDER — INSULIN ASPART 100 UNIT/ML ~~LOC~~ SOLN
5.0000 [IU] | Freq: Three times a day (TID) | SUBCUTANEOUS | Status: DC
  Administered 2018-11-22 – 2018-11-28 (×16): 5 [IU] via SUBCUTANEOUS

## 2018-11-22 MED ORDER — SODIUM CHLORIDE 0.9% IV SOLUTION
Freq: Once | INTRAVENOUS | Status: AC
  Administered 2018-11-22: 14:00:00 via INTRAVENOUS

## 2018-11-22 NOTE — Progress Notes (Signed)
Physical Therapy Treatment Patient Details Name: Kathrina Crosley MRN: [id number] DOB: [birth year] Today's Date: 11/22/2018    History of Present Illness 78 year old female admitted for wound infection and hyperglycemia. PMH:  DM, CKD, A Fib, CAD, HTN and seizure disorder    PT Comments    Limited session 2* pt with low hgb and only about 1/2 way through blood transfusion.  Roll to L and R sides. Pt is improving-only required Mod assist to roll to R side on today. She did require Mod assist +2 to roll to L side. Pt still yells out in pain when R LE is moved. Will continue to follow and progress activity as tolerated.    Follow Up Recommendations  SNF     Equipment Recommendations  None recommended by PT    Recommendations for Other Services       Precautions / Restrictions Precautions Precaution Comments: gout pain in R foot/knee Restrictions Weight Bearing Restrictions: No    Mobility  Bed Mobility Overal bed mobility: Needs Assistance Bed Mobility: Rolling Rolling: Mod assist;+2 for physical assistance;+2 for safety/equipment         General bed mobility comments: Mod Assist to roll to R side, Mod assist +2 to roll to L side. Cues for technique, use of bedrails.   Transfers                 General transfer comment: NT-deferred 2* low hgb and pt still receiving blood  Ambulation/Gait                 Stairs             Wheelchair Mobility    Modified Rankin (Stroke Patients Only)       Balance                                            Cognition Arousal/Alertness: Awake/alert(but seemed drowsy/tired) Behavior During Therapy: WFL for tasks assessed/performed Overall Cognitive Status: Within Functional Limits for tasks assessed                                        Exercises      General Comments        Pertinent Vitals/Pain Pain Assessment: Faces Faces Pain Scale: Hurts whole lot Pain  Location: R LE, L buttocks during hydro Pain Descriptors / Indicators: Grimacing;Moaning;Discomfort;Tender Pain Intervention(s): Limited activity within patient's tolerance    Home Living                      Prior Function            PT Goals (current goals can now be found in the care plan section) Progress towards PT goals: Progressing toward goals    Frequency    Min 2X/week(may need to see more often for insurance notes)      PT Plan Current plan remains appropriate    Co-evaluation              AM-PAC PT "6 Clicks" Mobility   Outcome Measure  Help needed turning from your back to your side while in a flat bed without using bedrails?: Total Help needed moving from lying on your back to sitting on the side of a flat bed without  using bedrails?: Total Help needed moving to and from a bed to a chair (including a wheelchair)?: Total Help needed standing up from a chair using your arms (e.g., wheelchair or bedside chair)?: Total Help needed to walk in hospital room?: Total Help needed climbing 3-5 steps with a railing? : Total 6 Click Score: 6    End of Session   Activity Tolerance: Patient limited by pain Patient left: in bed;with call bell/phone within reach   PT Visit Diagnosis: Muscle weakness (generalized) (M62.81);Other abnormalities of gait and mobility (R26.89);Pain;Difficulty in walking, not elsewhere classified (R26.2) Pain - Right/Left: Right Pain - part of body: Leg     Time: 1500-1508 PT Time Calculation (min) (ACUTE ONLY): 8 min  Charges:  $Therapeutic Activity: 8-22 mins                        Weston Anna, PT Acute Rehabilitation Services Pager: [phone] Office: [phone]

## 2018-11-22 NOTE — Care Management Important Message (Signed)
Important Message  Patient Details IM letter given to Servando Snare SW to present to the Patient Name: Emily Hughes MRN: [id number] Date of Birth: [birth year]   Medicare Important Message Given:  Yes    [name] 11/22/2018, 10:57 AM

## 2018-11-22 NOTE — Progress Notes (Signed)
PROGRESS NOTE    Emily Hughes  HGD:[id number] DOB: Jun 05, 1941 DOA: 11/08/2018 PCP: Susy Frizzle, MD Brief Narrative: 78 year old African-American female with a past medical history significant for but not limited to diabetes mellitus type 2, CKD stage IV, hypothyroidism, CAD, GERD, hypertension, hyperlipidemia, history of seizure disorder, ? Atrial fibrillation, long with comorbidities who presented to the emergency room with a 3-day history of pain and swelling in her buttocks along with a fever of 102. Family reportedly gave her some Tylenol that helped her improve her temperature down to 99 but she is found to have elevated blood pressures for last few days when in the 400s. She has not been taking her insulin for last 2 days and has not been checking her glucose regularly. On admission she stated that the left buttock pain lasted for last few days and was severe 10 out of 10 and reportedly was a boil that then progressed.  She was worked up and admitted for sepsis secondary to abscess of the left buttock as well as DKA. She was placed in the stepdown unit and was placed on glucose stabilizer and has improvement in her blood sugars. She was incidentally noted to be in atrial fibrillation/A Flutter which then converted to NSR a few days ago. Cardiology was consulted for preoperative clearance and patient was taken for incision and drainage of her abscess on her left buttock and she is POD2. DKA improved and she was transitioned to Long Acting Insulin even though Her CO2 is not >20 given that she has chronic Metabolic Acidosis 2/2 to her Renal Disease and is on Sodium Bicarbonate. She tolerated her Diet without issues but complained of some Abdominal Pain.   Liver function tests were elevated and right upper quadrant ultrasound was done and showed possible acute cholecystitis. I discussed with general surgery Dr. Jens Som who recommended obtaining a HIDA scan however HIDA scan with EF  is not able to be done at this time and if EF is needed to be done will come will be on Tuesday per nursing. So will obtain a HIDA scan without the EF currently. And this HIDA scan was normal hepatobiliary scan so Dr. Windle Guard was less inclined for acute cholecystitis and felt that her LFTs being elevated could be reactionary. We will continue to monitor closely and antibiotics have been de-escalated to just IV cefepime Flagyl for now and will continue to de-escalate accordingly. Echocardiogram was done yesterday.   Assessment & Plan:   Principal Problem:   Sepsis ([place]) Active Problems:   Essential hypertension   CAD (coronary artery disease)   GERD (gastroesophageal reflux disease)   DKA (diabetic ketoacidoses) (HCC)   Chronic kidney disease (CKD), stage IV (severe) (HCC)   Abscess of left buttock   Atrial fibrillation with RVR (HCC)   Elevated troponin   Abnormal LFTs   Acute renal failure superimposed on stage 4 chronic kidney disease (HCC)   Acute metabolic encephalopathy   Cellulitis and abscess of buttock   Acute urinary retention   1 sepsis secondary to left buttocks/gluteal abscess status post incision and drainage of abscess POD #12 Patient was admitted meeting sepsis criteria with fever, leukocytosis, tachycardia(new onset atrial fibrillation).  Sepsis protocol was initiated in the ED.  Sepsis physiology improving as patient currently afebrile.  Tachycardia improving.  Leukocytosis resolved.   Blood cultures with no growth to date x5 days.  Urine culture is negative.  MRSA PCR negative.  COVID-19 negative.  Cultures from left buttocks abscess positive for  E. coli which is pansensitive.  Patient was seen in consultation by general surgery and underwent incision and drainage of abscess with cultures sent.  Procalcitonin level was 6.89 and trended down to 4.82.  Lactic acid level trended down from 2.9-1.0.  Patient initially placed on IV vancomycin, IV cefepime and IV metronidazole.   Antibiotics narrowed down patient currently on IV Unasyn.  Patient currently receiving hydrotherapy which was started on 11/14/2018.  Patient also on twice daily wet-to-dry dressing changes per general surgery.  Status post 10 days of IV antibiotics post incision and drainage.  Per general surgery.  Follow.  Surgery planning daily hydrotherapy and bedside debridement as needed.  2.  Diabetes mellitus type 2/DKA Patient had presented in DKA felt secondary to problem #1.  Hemoglobin A1c 8.4.  On admission patient noted to have a beta hydroxybutyric acid of 2.29.  Patient was placed on the glucose stabilizer and subsequently transition to subcutaneous Lantus.  Increase Lantus to 25 units daily increase NovoLog to 5 units 3 times a day with meals for better control of blood sugar.  3.  New onset/Transient atrial fibrillation/atrial flutter with variable block status post conversion to normal sinus rhythm with transient third-degree AV block. Felt likely in the setting of sepsis secondary to problem #1.  Patient converted to normal sinus rhythm with a short episode of third-degree AV block overnight while asleep thought possibly secondary to AV nodal blocking drugs and sleep apnea.  Patient initially on a Cardizem drip and subsequently transitioned to oral Cardizem which has subsequently been discontinued.  Patient seen in consultation by cardiology who feels no need for anticoagulation at this time given short duration and need for surgical intervention.  Continue current regimen of Toprol-XL 50 mg daily.  Appreciate cardiology input and recommendations.  4.  Nausea resolved  5.  Hypertension soft will hold BP meds this morning.  He is on Toprol.  Clonidine and Norvasc.  Her blood pressure is 121/57.   DC Norvasc due to lower extremity edema and soft blood pressure.  6.  Elevated troponin Felt secondary to demand ischemia in the setting of sepsis, elevated lactic acid and acute on chronic kidney failure.   EKG with question of flutter versus sinus tach with PACs.  Repeat EKG showed a normal sinus rhythm and evidence of LVH with no signs of ischemia noted.  Patient seen by cardiology who felt no further ischemic work-up needed at this time.  Continue aspirin 81 mg daily, Toprol-XL, statin.  Follow.  7.  Acute on chronic kidney disease stage IV/chronic metabolic acidosis Felt secondary to prerenal azotemia in the setting of sepsis.  Patient on admission noted to have a creatinine of 4.76.  Patient placed on IV fluids which have subsequently been discontinued.  Renal function improved and currently close to baseline.  Creatinine at 2.86.  Continue calcitriol.  Acidosis improved with increased bicarb tablets of 1300 mg p.o. 3 times daily.  Patient with some lower extremity edema.  Placed on Lasix 40 mg orally twice daily.  Patient was on Lasix 80 mg in the morning and 40 mg at bedtime as her home medication dose.  Outpatient follow-up with nephrology.   8.  Hypothyroidism TSH of 1.815 on 11/09/2018.  Synthroid.   9.  Coronary artery disease Currently stable.  Patient denies any acute chest pain.  Patient was seen by cardiology during the hospitalization secondary to new onset atrial flutter with variable block.  Continue current cardiac medications of aspirin, Toprol-XL, statin, Norvasc.  Outpatient follow-up with cardiology.  10.  Hyponatremia sodium 131 avoid hypotonic fluids and monitor.    11.  Hypermagnesemia follow labs tomorrow.   12.  Acute encephalopathy/somnolence and lethargy-is more awake and alert today asked me to talk to her sister and update her sister. Questionable etiology.  Likely medication induced.  Patient noted to be somnolent and lethargic however improving daily, patient answering questions appropriately and following commands.  Likely close to baseline.  Head CT done was negative for any acute abnormalities.  ABG with a pH of 7.383/PCO2 of 27/PO2 of 77.  Chest x-ray negative,  blood cultures negative to date, urine cultures negative.  Patient being treated for left gluteal abscess on empiric IV antibiotics.  Continue to limit sedating medications.  IV morphine has been discontinued.  Patient now on oxycodone.  Patient seems to be slowly improving following commands appropriately and answering some questions.  EEG done consistent with generalized nonspecific cerebral dysfunction/encephalopathy.  No seizures or seizure predisposition recorded on the study.  Monitor for now.  13.  Morbid obesity Weight loss and dietary counseling given.  14.  History of gout Continue allopurinol.   15.  Abnormal LFTs Likely reactive.  LFTs trended down.  Acute hepatitis panel was negative.  Right upper quadrant ultrasound showed gallbladder sludge and positive sonographic Murphy sign however no biliary duct dilatation.  HIDA scan which was done was normal.  Dr. Alfredia Ferguson discussed the case with general surgery who recommended no further intervention at this time and to continue to trend hepatic function  16.  Normocytic anemia/anemia of chronic kidney disease Likely dilutional and postoperative drop.  Hemoglobin was 11.4 and dropped to 7.7 and now at 7.7.  Hemoglobin 6.9 will transfuse 1 unit of packed RBC. Patient with no overt bleeding. Anemia panel consistent with anemia of chronic disease.  Follow H&H.    17.  Leukocytosis resolved. Likely secondary to problem #1.  WBC was fluctuating.  Patient afebrile.  Patient has been pancultured with blood cultures with no growth to date, urine culture is negative, chest x-ray with no acute infiltrate. COVID-19 negative. Patient with no respiratory symptoms.  Patient with left gluteal abscess that has been I&D and cultures positive for E. coli.  Status post 10 days IV antibiotics post incision and drainage.  No further antibiotics needed at this time.    18. gastroesophageal reflux disease Continue PPI.  19.  Loose stools RN reports still some  loose stools today stool softeners had been stopped yesterday.  20.  Hyperkalemia calcium 5.5 continue Veltassa 21.  urinary retention Foley catheter placed back in due to urinary retention.  Continue Flomax.  DC Foley catheter.  DVT prophylaxis: SCDs Code Status: Full Family Communication: dw sister [phone] Disposition Plan: Likely need SNF on discharge when okay with general surgery..    Consultants:   Cardiology: Dr. Meda Coffee 11/09/2018  General surgery: Dr. Marlou Starks III 11/08/2018  Procedures:   CT head 11/12/2018  CT pelvis 11/08/2018  Chest x-ray 11/08/2018, 11/12/2018, 11/13/2018, 11/15/2018  Plain films of the left foot 11/12/2018  Limited echocardiogram 11/11/2018  Right upper quadrant ultrasound 11/11/2018  Incision and drainage of left buttocks abscess 11/09/2018 per Dr. Ninfa Linden  EEG 11/15/2018  Antimicrobials:   Oral Flagyl 11/13/2018>>>> 11/14/2018  IV Unasyn 11/14/2018>>>> 11/19/2018  IV cefepime 11/08/2018>>>> 11/14/2018  IV Flagyl 11/08/2018>>>>> 11/13/2018  IV vancomycin 11/08/2018>>>> 11/11/2018   Nutrition Problem: Increased nutrient needs Etiology: acute illness, wound healing     Signs/Symptoms: estimated needs  Interventions: Juven, Ensure Enlive (each supplement provides 350kcal and 20 grams of protein)  Estimated body mass index is 47.61 kg/m as calculated from the following:   Height as of this encounter: 5\' 3"  (1.6 m).   Weight as of this encounter: 121.9 kg.   Subjective: She is resting in bed discussed CODE STATUS with the patient and she reported she wanted to be DO NOT RESUSCITATE she denied chest pain or shortness of breath  Objective: Vitals:   11/21/18 0510 11/21/18 1500 11/21/18 2049 11/22/18 0624  BP: 130/61 107/62 119/60 (!) 121/57  Pulse: 79 85 100 91  Resp: 14 14 19 19   Temp: 98.5 F (36.9 C) 98.2 F (36.8 C) 99.9 F (37.7 C) 99.6 F (37.6 C)  TempSrc: Oral Oral Oral Oral  SpO2: 98% 100% 97% 97%  Weight:    121.9 kg    Height:        Intake/Output Summary (Last 24 hours) at 11/22/2018 1232 Last data filed at 11/22/2018 0400 Gross per 24 hour  Intake 705 ml  Output 2300 ml  Net -1595 ml   Filed Weights   11/17/18 0837 11/19/18 0512 11/22/18 0624  Weight: 115 kg 114.5 kg 121.9 kg    Examination:  General exam: Appears calm and comfortable  Respiratory system: Clear to auscultation. Respiratory effort normal. Cardiovascular system: S1 & S2 heard, RRR. No JVD, murmurs, rubs, gallops or clicks. No pedal edema. Gastrointestinal system: Abdomen is nondistended, soft and nontender. No organomegaly or masses felt. Normal bowel sounds heard. Central nervous system: Alert and oriented. No focal neurological deficits. Extremities:1 plus edema right morethan left Left buttock area picture noted with some necrosis  Psychiatry: Judgement and insight appear normal. Mood & affect appropriate.     Data Reviewed: I have personally reviewed following labs and imaging studies  CBC: Recent Labs  Lab 11/17/18 0645 11/18/18 0629 11/19/18 0525 11/20/18 0459 11/21/18 0817 11/22/18 0615  WBC 22.1* 17.0* 15.9* 13.5* 13.1* 9.6  NEUTROABS 17.7* 12.9* 11.7* 9.8* 9.8*  --   HGB 8.9* 8.7* 8.2* 7.3* 7.7* 6.9*  HCT 27.8* 28.4* 26.3* 23.3* 24.7* 21.8*  MCV 82.7 86.3 87.4 86.0 88.2 86.2  PLT 391 416* 408* 396 402* 782*   Basic Metabolic Panel: Recent Labs  Lab 11/16/18 0230 11/17/18 0645 11/18/18 0629 11/19/18 0525 11/20/18 0459 11/21/18 0817 11/22/18 0615  NA 142 137 135 136 134* 134* 131*  K 4.5 5.7* 4.5 4.8 5.1 5.1 5.5*  CL 118* 110 108 109 107 107 102  CO2 19* 16* 19* 18* 21* 19* 21*  GLUCOSE 107* 178* 160* 140* 215* 200* 258*  BUN 92* 84* 80* 72* 68* 69* 78*  CREATININE 2.73* 2.53* 2.46* 2.44* 2.61* 2.86* 2.86*  CALCIUM 8.4* 8.8* 8.8* 8.8* 8.4* 8.7* 8.6*  MG 2.9*  --   --   --   --   --   --   PHOS 4.1 4.2  --   --   --   --   --    GFR: Estimated Creatinine Clearance: 21.2 mL/min (A) (by C-G  formula based on SCr of 2.86 mg/dL (H)). Liver Function Tests: Recent Labs  Lab 11/16/18 0230 11/17/18 0645  ALBUMIN 1.7* 2.0*   No results for input(s): LIPASE, AMYLASE in the last 168 hours. No results for input(s): AMMONIA in the last 168 hours. Coagulation Profile: No results for input(s): INR, PROTIME in the last 168 hours. Cardiac Enzymes: No results for input(s): CKTOTAL, CKMB, CKMBINDEX, TROPONINI in the last 168  hours. BNP (last 3 results) No results for input(s): PROBNP in the last 8760 hours. HbA1C: No results for input(s): HGBA1C in the last 72 hours. CBG: Recent Labs  Lab 11/21/18 0741 11/21/18 1135 11/21/18 1630 11/21/18 2051 11/22/18 0735  GLUCAP 208* 209* 239* 255* 250*   Lipid Profile: No results for input(s): CHOL, HDL, LDLCALC, TRIG, CHOLHDL, LDLDIRECT in the last 72 hours. Thyroid Function Tests: No results for input(s): TSH, T4TOTAL, FREET4, T3FREE, THYROIDAB in the last 72 hours. Anemia Panel: No results for input(s): VITAMINB12, FOLATE, FERRITIN, TIBC, IRON, RETICCTPCT in the last 72 hours. Sepsis Labs: No results for input(s): PROCALCITON, LATICACIDVEN in the last 168 hours.  No results found for this or any previous visit (from the past 240 hour(s)).       Radiology Studies: No results found.      Scheduled Meds:  sodium chloride   Intravenous Once   allopurinol  300 mg Oral Daily   amLODipine  2.5 mg Oral Daily   aspirin EC  81 mg Oral Daily   calcitRIOL  0.25 mcg Oral Daily   Chlorhexidine Gluconate Cloth  6 each Topical Daily   cloNIDine  0.1 mg Oral BID   feeding supplement (ENSURE ENLIVE)  237 mL Oral BID BM   furosemide  40 mg Oral BID   insulin aspart  0-15 Units Subcutaneous TID WC   insulin aspart  3 Units Subcutaneous TID WC   insulin glargine  18 Units Subcutaneous Daily   levothyroxine  50 mcg Oral Q0600   mouth rinse  15 mL Mouth Rinse BID   metoprolol succinate  50 mg Oral Daily   nutrition  supplement (JUVEN)  1 packet Oral BID BM   pantoprazole  40 mg Oral Q0600   patiromer  8.4 g Oral Daily   pravastatin  80 mg Oral QHS   sodium bicarbonate  1,300 mg Oral TID   tamsulosin  0.4 mg Oral QPC breakfast   Continuous Infusions:   LOS: 14 days     Georgette Shell, MD [facility]  If 7PM-7AM, please contact night-coverage [email or web address] Password [facility] 11/22/2018, 12:32 PM

## 2018-11-22 NOTE — Progress Notes (Addendum)
HYDROTHERAPY TREATMENT     11/22/18 1500  Subjective Assessment  Subjective "why do we have to do this?"  Patient and Family Stated Goals less pain  Date of Onset  (abscess present on admission)  Prior Treatments s/p I&D 5/7  Evaluation and Treatment  Evaluation and Treatment Procedures Explained to Patient/Family Yes  Evaluation and Treatment Procedures agreed to  Wound / Incision (Open or Dehisced) 11/13/18 Incision - Open Buttocks Left L buttocks wound s/p I&D by Surgery 11/09/18. **HYDRO/PT**  Date First Assessed/Time First Assessed: 11/13/18 1500   Wound Type: Incision - Open  Location: Buttocks  Location Orientation: Left  Wound Description (Comments): L buttocks wound s/p I&D by Surgery 11/09/18. **HYDRO/PT**  Dressing Type Barrier Film (skin prep);Gauze ;Moist to dry;ABD;Impregnated gauze (petrolatum) (Kerlix packing)  Dressing Changed Changed  Dressing Status Old drainage  Dressing Change Frequency Twice a day  Site / Wound Assessment Black;Brown;Yellow;Pink  % Wound base Red or Granulating 70%  % Wound base Yellow/Fibrinous Exudate 15%  % Wound base Black/Eschar 15%  Peri-wound Assessment Excoriated  Margins Unattached edges (unapproximated)  Drainage Amount Moderate  Drainage Description Serosanguineous  Treatment Debridement (Selective);Hydrotherapy (Pulse lavage);Packing (Saline gauze)  Hydrotherapy  Pulsed lavage therapy - wound location L medial buttocks  Pulsed Lavage with Suction (psi) 8 psi  Pulsed Lavage with Suction - Normal Saline Used 1000 mL  Pulsed Lavage Tip Tip with splash shield  Selective Debridement  Selective Debridement - Location  (No debridement today-low hgb-getting blood-will resume tomorrow)  Wound Therapy - Assess/Plan/Recommendations  Wound Therapy - Clinical Statement 78 yo female admitted with L gluteal/buttock abscess. S/P I&D 11/09/18.   Wound Therapy - Functional Problem List limited mobility  Factors Delaying/Impairing Wound Healing  Diabetes Mellitus;Multiple medical problems;Immobility;Incontinence  Hydrotherapy Plan Debridement;Dressing change;Patient/family education;Pulsatile lavage with suction  Wound Therapy - Frequency 6X / week  Wound Therapy - Current Recommendations Case manager/social work  Wound Therapy - Follow Up Recommendations Skilled nursing facility  Wound Plan Per new order from general surgery, will resume daily hydro treatments. Pt may not need hydrotherapy at SNF if wound continues to progress well.   Wound Therapy Goals - Improve the function of patient's integumentary system by progressing the wound(s) through the phases of wound healing by:  Decrease Necrotic Tissue to 20%  Decrease Necrotic Tissue - Progress Progressing toward goal  Increase Granulation Tissue to 80%  Increase Granulation Tissue - Progress Progressing toward goal  Improve Drainage Characteristics Min  Improve Drainage Characteristics - Progress Progressing toward goal  Goals/treatment plan/discharge plan were made with and agreed upon by patient/family Yes  Time For Goal Achievement 2 weeks  Wound Therapy - Potential for Goals Good    Weston Anna, PT Acute Rehabilitation Services Pager: [phone] Office: [phone]

## 2018-11-22 NOTE — Progress Notes (Signed)
PT Cancellation Note  Patient Details Name: Emily Hughes MRN: [id number] DOB: 08/25/1940   Cancelled Treatment:    Reason Eval/Treat Not Completed: Medical issues which prohibited therapy--hgb low-pt to receive transfusion. Will check back later.    Weston Anna, PT Acute Rehabilitation Services Pager: [phone] Office: [phone]

## 2018-11-22 NOTE — Progress Notes (Signed)
OT Cancellation Note  Patient Details Name: Emily Hughes MRN: [id number] DOB: 20-Aug-1940   Cancelled Treatment:    Reason Eval/Treat Not Completed: Medical issues which prohibited therapy. Pt to get PRBCs today.  Will check back later or tomorrow as schedule permits.  [facility] 11/22/2018, 8:15 AM  Lesle Chris, OTR/L Acute Rehabilitation Services [phone] [facility] pager [phone] office 11/22/2018

## 2018-11-22 NOTE — Progress Notes (Signed)
Inpatient Diabetes Program Recommendations  AACE/ADA: New Consensus Statement on Inpatient Glycemic Control (2015)  Target Ranges:  Prepandial:   less than 140 mg/dL      Peak postprandial:   less than 180 mg/dL (1-2 hours)      Critically ill patients:  140 - 180 mg/dL   Lab Results  Component Value Date   GLUCAP 250 (H) 11/22/2018   HGBA1C 8.4 (H) 11/11/2018    Review of Glycemic Control  Home DM Meds:  Lantus 30 units Daily                            Novolog 10 units Daily per SSI                            Amaryl 4 mg Daily  Current Orders: Lantus 15 units Daily                             Novolog Moderate Correction Scale/ SSI (0-15 units) TID AC   MD- Please consider the following in-hospital insulin adjustments:   1. Increase Lantus slightly to 25 units Daily   2. Start Novolog Meal Coverage: Novolog 5 units TID with meals  (Please add the following Hold Parameters: Hold if pt eats <50% of meal, Hold if pt NPO)  Thank you, Bethena Roys E. Jed Kutch, RN, MSN, CDE  Diabetes Coordinator Inpatient Glycemic Control Team Team Pager [phone] (8am-5pm) 11/22/2018 11:00 AM

## 2018-11-23 LAB — TYPE AND SCREEN
ABO/RH(D): O POS
Antibody Screen: NEGATIVE
Unit division: 0

## 2018-11-23 LAB — COMPREHENSIVE METABOLIC PANEL
ALT: 65 U/L — ABNORMAL HIGH (ref 0–44)
AST: 67 U/L — ABNORMAL HIGH (ref 15–41)
Albumin: 1.9 g/dL — ABNORMAL LOW (ref 3.5–5.0)
Alkaline Phosphatase: 71 U/L (ref 38–126)
Anion gap: 7 (ref 5–15)
BUN: 75 mg/dL — ABNORMAL HIGH (ref 8–23)
CO2: 22 mmol/L (ref 22–32)
Calcium: 8.7 mg/dL — ABNORMAL LOW (ref 8.9–10.3)
Chloride: 104 mmol/L (ref 98–111)
Creatinine, Ser: 2.72 mg/dL — ABNORMAL HIGH (ref 0.44–1.00)
GFR calc Af Amer: 19 mL/min — ABNORMAL LOW (ref >60)
GFR calc non Af Amer: 16 mL/min — ABNORMAL LOW (ref >60)
Glucose, Bld: 195 mg/dL — ABNORMAL HIGH (ref 70–99)
Potassium: 5.5 mmol/L — ABNORMAL HIGH (ref 3.5–5.1)
Sodium: 133 mmol/L — ABNORMAL LOW (ref 135–145)
Total Bilirubin: 0.8 mg/dL (ref 0.3–1.2)
Total Protein: 5.9 g/dL — ABNORMAL LOW (ref 6.5–8.1)

## 2018-11-23 LAB — CBC
HCT: 25.4 % — ABNORMAL LOW (ref 36.0–46.0)
Hemoglobin: 7.9 g/dL — ABNORMAL LOW (ref 12.0–15.0)
MCH: 27 pg (ref 26.0–34.0)
MCHC: 31.1 g/dL (ref 30.0–36.0)
MCV: 86.7 fL (ref 80.0–100.0)
Platelets: 413 10*3/uL — ABNORMAL HIGH (ref 150–400)
RBC: 2.93 MIL/uL — ABNORMAL LOW (ref 3.87–5.11)
RDW: 15.9 % — ABNORMAL HIGH (ref 11.5–15.5)
WBC: 10.3 10*3/uL (ref 4.0–10.5)
nRBC: 0 % (ref 0.0–0.2)

## 2018-11-23 LAB — GLUCOSE, CAPILLARY
Glucose-Capillary: 188 mg/dL — ABNORMAL HIGH (ref 70–99)
Glucose-Capillary: 230 mg/dL — ABNORMAL HIGH (ref 70–99)
Glucose-Capillary: 146 mg/dL — ABNORMAL HIGH (ref 70–99)
Glucose-Capillary: 95 mg/dL (ref 70–99)

## 2018-11-23 LAB — BPAM RBC
Blood Product Expiration Date: 202006152359
ISSUE DATE / TIME: 202005201335
Unit Type and Rh: 5100

## 2018-11-23 LAB — MAGNESIUM: Magnesium: 2.3 mg/dL (ref 1.7–2.4)

## 2018-11-23 NOTE — Progress Notes (Signed)
PROGRESS NOTE    Emily Hughes  ZOX:[id number] DOB: 12-18-1940 DOA: 11/08/2018 PCP: Susy Frizzle, MD Brief Narrative:78 year old African-American female with a past medical history significant for but not limited to diabetes mellitus type 2, CKD stage IV, hypothyroidism, CAD, GERD, hypertension, hyperlipidemia, history of seizure disorder, ? Atrial fibrillation, long with comorbidities who presented to the emergency room with a 3-day history of pain and swelling in her buttocks along with a fever of 102. Family reportedly gave her some Tylenol that helped her improve her temperature down to 99 but she is found to have elevated blood pressures for last few days when in the 400s. She has not been taking her insulin for last 2 days and has not been checking her glucose regularly. On admission she stated that the left buttock pain lasted for last few days and was severe 10 out of 10 and reportedly was a boil that then progressed.  She was worked up and admitted for sepsis secondary to abscess of the left buttock as well as DKA. She was placed in the stepdown unit and was placed on glucose stabilizer and has improvement in her blood sugars. She was incidentally noted to be in atrial fibrillation/A Flutter which then converted to NSR a few days ago. Cardiology was consulted for preoperative clearance and patient was taken for incision and drainage of her abscess on her left buttock and she is POD2. DKA improved and she was transitioned to Long Acting Insulin even though Her CO2 is not >20 given that she has chronic Metabolic Acidosis 2/2 to her Renal Disease and is on Sodium Bicarbonate. She tolerated her Diet without issues but complained of some Abdominal Pain.   Liver function tests were elevated and right upper quadrant ultrasound was done and showed possible acute cholecystitis. I discussed with general surgery Dr. Jens Som who recommended obtaining a HIDA scan however HIDA scan with EF  is not able to be done at this time and if EF is needed to be done will come will be on Tuesday per nursing. So will obtain a HIDA scan without the EF currently. And this HIDA scan was normal hepatobiliary scan so Dr. Windle Guard was less inclined for acute cholecystitis and felt that her LFTs being elevated could be reactionary. We will continue to monitor closely and antibiotics have been de-escalated to just IV cefepime Flagyl for now and will continue to de-escalate accordingly. Echocardiogram was done yesterday  Assessment & Plan:   Principal Problem:   Sepsis ([place]) Active Problems:   Essential hypertension   CAD (coronary artery disease)   GERD (gastroesophageal reflux disease)   DKA (diabetic ketoacidoses) (HCC)   Chronic kidney disease (CKD), stage IV (severe) (HCC)   Abscess of left buttock   Atrial fibrillation with RVR (HCC)   Elevated troponin   Abnormal LFTs   Acute renal failure superimposed on stage 4 chronic kidney disease (HCC)   Acute metabolic encephalopathy   Cellulitis and abscess of buttock   Acute urinary retention   1 sepsis secondary to left buttocks/gluteal abscess status post incision and drainageof abscess POD #12 Patient was admitted meeting sepsis criteria with fever, leukocytosis, tachycardia(new onset atrial fibrillation). Sepsis protocol was initiated in the ED. Sepsis physiology improving as patient currently afebrile. Tachycardia improving.  Leukocytosis resolved. Blood cultures with no growth to date x7 days. Urine culture is negative. MRSA PCR negative. COVID-19 negative. Cultures from left buttocks abscess positive for E. coli which is pansensitive. Patient was seen in consultation by general  surgery and underwent incision and drainage of abscess with cultures sent. Procalcitonin level was 6.89 and trended down to 4.82. Lactic acid level trended down from 2.9-1.0. Patient initially placed on IV vancomycin, IV cefepime and IV metronidazole.  Antibiotics narrowed down patient currently on IV Unasyn. Patient currently receiving hydrotherapy which was started on 11/14/2018. Patient also on twice daily wet-to-dry dressing changes per general surgery. Status post 10 days of IV antibiotics post incision and drainage. Per general surgery. Follow.  Surgery planning daily hydrotherapy and bedside debridement as needed.  Will need to find a SNF that accepts hydrotherapy.  2. Diabetes mellitus type 2/DKA Patient had presented in DKA felt secondary to problem #1. Hemoglobin A1c 8.4. On admission patient noted to have a beta hydroxybutyric acid of 2.29. Patient was placed on the glucose stabilizer and subsequently transition to subcutaneous Lantus.  Increase Lantus to 25 units daily increase NovoLog to 5 units 3 times a day with meals for better control of blood sugar.   3 New onset/Transient atrial fibrillation/atrial flutter with variable block status post conversion to normal sinus rhythm with transient third-degree AV block. Felt likely in the setting of sepsis secondary to problem #1. Patient converted to normal sinus rhythm with a short episode of third-degree AV block overnight while asleep thought possibly secondary to AV nodal blocking drugs and sleep apnea. Patient initially on a Cardizem drip and subsequently transitioned to oral Cardizem which has subsequently been discontinued. Patient seen in consultation by cardiology who feels no need for anticoagulation at this time given short duration and need for surgical intervention. Continue current regimen of Toprol-XL 50 mg daily. Appreciate cardiology input and recommendations.  4. Nausea resolved  5. Hypertension  blood pressure better.  Continue Toprol and clonidine Norvasc has been stopped yesterday.   6. Elevated troponin Felt secondary to demand ischemia in the setting of sepsis, elevated lactic acid and acute on chronic kidney failure. EKG with question of flutter  versus sinus tach with PACs. Repeat EKG showed a normal sinus rhythm and evidence of LVH with no signs of ischemia noted. Patient seen by cardiology who felt no further ischemic work-up needed at this time. Continue aspirin 81 mg daily, Toprol-XL, statin. Follow.  7Acute on chronic kidney disease stage IV/chronic metabolic acidosis Felt secondary to prerenal azotemia in the setting of sepsis. Patient on admission noted to have a creatinine of 4.76. Patient placed on IV fluids which have subsequently been discontinued. Renal function improved and currently close to baseline. Creatinine at 2.76 Continue calcitriol. Acidosis improved with increased bicarb tablets of 1300 mg p.o. 3 times daily. Patient with some lower extremity edema. Placed on Lasix 40 mg orally twice daily. Patient was on Lasix 80 mg in the morning and 40 mg at bedtime as her home medication dose. Outpatient follow-up with nephrology.   8. Hypothyroidism TSH of 1.815 on 11/09/2018. Synthroid.   9. Coronary artery disease Currently stable. Patient denies any acute chest pain. Patient was seen by cardiology during the hospitalization secondary to new onset atrial flutter with variable block. Continue current cardiac medications of aspirin, Toprol-XL, statin, Norvasc. Outpatient follow-up with cardiology.  10. Hyponatremia sodium 131 avoid hypotonic fluids and monitor.   11. Hypermagnesemia follow labs tomorrow.  12. Acute encephalopathy/somnolence and lethargy-is more awake and alert today asked me to talk to her sister and update her sister. Questionable etiology. Likely medication induced. Patient noted to be somnolent and lethargic however improving daily, patient answering questions appropriately and following commands. Likely close  to baseline. Head CT done was negative for any acute abnormalities. ABG with a pH of 7.383/PCO2 of 27/PO2 of 77. Chest x-ray negative, blood cultures negative to date,  urine cultures negative. Patient being treated for left gluteal abscess on empiric IV antibiotics. Continue to limit sedating medications. IV morphine has been discontinued. Patient now on oxycodone. Patient seems to be slowly improving following commands appropriately and answering some questions. EEG done consistent with generalized nonspecific cerebral dysfunction/encephalopathy. No seizures or seizure predisposition recorded on the study. Monitor for now.  13. Morbid obesity Weight loss and dietary counseling given.  14. History of gout Continue allopurinol.  15. Abnormal LFTs Likely reactive. LFTs trended down. Acute hepatitis panel was negative. Right upper quadrant ultrasound showed gallbladder sludge and positive sonographic Murphy sign however no biliary duct dilatation. HIDA scan which was done was normal. Dr. Alfredia Ferguson discussed the case with general surgery who recommended no further intervention at this time and to continue to trend hepatic function   16. Normocytic anemia/anemia of chronic kidney disease Likely dilutional and postoperative drop. Hemoglobin was 11.4 and dropped to 7.7 and now at 7.7.   He received 1 unit of blood transfusion yesterday her hemoglobin went up to 7.9 from 6.9.    17. Leukocytosis resolved. Likely secondary to problem #1. WBC was fluctuating. Patient afebrile. Patient has been pancultured with blood cultures with no growth to date, urine culture is negative, chest x-ray with no acute infiltrate. COVID-19 negative. Patient with no respiratory symptoms. Patient with left gluteal abscess that has been I&D and cultures positive for E. coli. Status post 10 days IV antibiotics post incision and drainage. No further antibiotics needed at this time.   18. gastroesophageal reflux disease Continue PPI.  19. Loose stools RN reports still some loose stools today stool softeners had been stopped yesterday.  20. Hyperkalemia  5.5  continue Veltassa  21. urinary retention Foley catheter placed back in due to urinary retention. Continue Flomax. DC Foley catheter.  DVT prophylaxis:SCDs Code Status:Full Family Communication:dw sister [phone] Disposition Plan:SNF on dischargewhen okay with general surgery..  Consultants:  Cardiology: Dr. Meda Coffee 11/09/2018  General surgery: Dr. Marlou Starks III 11/08/2018  Procedures:  CT head 11/12/2018  CT pelvis 11/08/2018  Chest x-ray 11/08/2018, 11/12/2018, 11/13/2018, 11/15/2018  Plain films of the left foot 11/12/2018  Limited echocardiogram 11/11/2018  Right upper quadrant ultrasound 11/11/2018  Incision and drainage of left buttocks abscess 11/09/2018 per Dr. Ninfa Linden  EEG 11/15/2018  Antimicrobials:   Oral Flagyl 11/13/2018>>>> 11/14/2018  IV Unasyn 11/14/2018>>>> 11/19/2018  IV cefepime 11/08/2018>>>> 11/14/2018  IV Flagyl 11/08/2018>>>>> 11/13/2018 IV vancomycin 11/08/2018>>>> 11/11/2018  Nutrition Problem: Increased nutrient needs Etiology: acute illness, wound healing     Signs/Symptoms: estimated needs    Interventions: Juven, Ensure Enlive (each supplement provides 350kcal and 20 grams of protein)  Estimated body mass index is 48.39 kg/m as calculated from the following:   Height as of this encounter: 5\' 3"  (1.6 m).   Weight as of this encounter: 123.9 kg.   Subjective:   Objective: Vitals:   11/22/18 1355 11/22/18 1640 11/22/18 2147 11/23/18 0552  BP: 121/66 129/65 (!) 144/65 (!) 130/58  Pulse: 90 84 100 90  Resp: 18 18 18 19   Temp: 98.8 F (37.1 C) 98.8 F (37.1 C) 98.8 F (37.1 C) 99 F (37.2 C)  TempSrc: Oral Oral Oral Oral  SpO2: 99% 100% 97% 96%  Weight:    123.9 kg  Height:  Intake/Output Summary (Last 24 hours) at 11/23/2018 0859 Last data filed at 11/23/2018 0715 Gross per 24 hour  Intake 1175 ml  Output 600 ml  Net 575 ml   Filed Weights   11/19/18 0512 11/22/18 0624 11/23/18 0552  Weight: 114.5 kg 121.9 kg 123.9 kg     Examination:  General exam: Appears calm and comfortable  Respiratory system: Clear to auscultation. Respiratory effort normal. Cardiovascular system: S1 & S2 heard, RRR. No JVD, murmurs, rubs, gallops or clicks. No pedal edema. Gastrointestinal system: Abdomen is nondistended, soft and nontender. No organomegaly or masses felt. Normal bowel sounds heard. Central nervous system: Alert and oriented. No focal neurological deficits. Extremities: Trace edema in bilateral lower extremities Skin: No rashes, lesions or ulcers Psychiatry: Judgement and insight appear normal. Mood & affect appropriate.     Data Reviewed: I have personally reviewed following labs and imaging studies  CBC: Recent Labs  Lab 11/17/18 0645 11/18/18 0629 11/19/18 0525 11/20/18 0459 11/21/18 0817 11/22/18 0615 11/23/18 0702  WBC 22.1* 17.0* 15.9* 13.5* 13.1* 9.6 10.3  NEUTROABS 17.7* 12.9* 11.7* 9.8* 9.8*  --   --   HGB 8.9* 8.7* 8.2* 7.3* 7.7* 6.9* 7.9*  HCT 27.8* 28.4* 26.3* 23.3* 24.7* 21.8* 25.4*  MCV 82.7 86.3 87.4 86.0 88.2 86.2 86.7  PLT 391 416* 408* 396 402* 417* 381*   Basic Metabolic Panel: Recent Labs  Lab 11/17/18 0645  11/19/18 0525 11/20/18 0459 11/21/18 0817 11/22/18 0615 11/23/18 0702  NA 137   < > 136 134* 134* 131* 133*  K 5.7*   < > 4.8 5.1 5.1 5.5* 5.5*  CL 110   < > 109 107 107 102 104  CO2 16*   < > 18* 21* 19* 21* 22  GLUCOSE 178*   < > 140* 215* 200* 258* 195*  BUN 84*   < > 72* 68* 69* 78* 75*  CREATININE 2.53*   < > 2.44* 2.61* 2.86* 2.86* 2.72*  CALCIUM 8.8*   < > 8.8* 8.4* 8.7* 8.6* 8.7*  MG  --   --   --   --   --   --  2.3  PHOS 4.2  --   --   --   --   --   --    < > = values in this interval not displayed.   GFR: Estimated Creatinine Clearance: 22.5 mL/min (A) (by C-G formula based on SCr of 2.72 mg/dL (H)). Liver Function Tests: Recent Labs  Lab 11/17/18 0645 11/23/18 0702  AST  --  67*  ALT  --  65*  ALKPHOS  --  71  BILITOT  --  0.8  PROT  --   5.9*  ALBUMIN 2.0* 1.9*   No results for input(s): LIPASE, AMYLASE in the last 168 hours. No results for input(s): AMMONIA in the last 168 hours. Coagulation Profile: No results for input(s): INR, PROTIME in the last 168 hours. Cardiac Enzymes: No results for input(s): CKTOTAL, CKMB, CKMBINDEX, TROPONINI in the last 168 hours. BNP (last 3 results) No results for input(s): PROBNP in the last 8760 hours. HbA1C: No results for input(s): HGBA1C in the last 72 hours. CBG: Recent Labs  Lab 11/22/18 0735 11/22/18 1215 11/22/18 1708 11/22/18 2145 11/23/18 0739  GLUCAP 250* 250* 213* 192* 188*   Lipid Profile: No results for input(s): CHOL, HDL, LDLCALC, TRIG, CHOLHDL, LDLDIRECT in the last 72 hours. Thyroid Function Tests: No results for input(s): TSH, T4TOTAL, FREET4, T3FREE, THYROIDAB in the last 72  hours. Anemia Panel: No results for input(s): VITAMINB12, FOLATE, FERRITIN, TIBC, IRON, RETICCTPCT in the last 72 hours. Sepsis Labs: No results for input(s): PROCALCITON, LATICACIDVEN in the last 168 hours.  No results found for this or any previous visit (from the past 240 hour(s)).       Radiology Studies: No results found.      Scheduled Meds: . allopurinol  300 mg Oral Daily  . aspirin EC  81 mg Oral Daily  . calcitRIOL  0.25 mcg Oral Daily  . Chlorhexidine Gluconate Cloth  6 each Topical Daily  . cloNIDine  0.1 mg Oral BID  . feeding supplement (ENSURE ENLIVE)  237 mL Oral BID BM  . furosemide  40 mg Oral BID  . insulin aspart  0-15 Units Subcutaneous TID WC  . insulin aspart  5 Units Subcutaneous TID WC  . insulin glargine  25 Units Subcutaneous Daily  . levothyroxine  50 mcg Oral Q0600  . mouth rinse  15 mL Mouth Rinse BID  . metoprolol succinate  50 mg Oral Daily  . nutrition supplement (JUVEN)  1 packet Oral BID BM  . pantoprazole  40 mg Oral Q0600  . patiromer  8.4 g Oral Daily  . pravastatin  80 mg Oral QHS  . sodium bicarbonate  1,300 mg Oral TID  .  tamsulosin  0.4 mg Oral QPC breakfast   Continuous Infusions:   LOS: 15 days     Georgette Shell, MD [facility]  If 7PM-7AM, please contact night-coverage [email or web address] Password [email or web address] 11/23/2018, 8:59 AM

## 2018-11-23 NOTE — Progress Notes (Signed)
                        HYDROTHERAPY TREATMENT     11/23/18 1045  Subjective Assessment  Subjective   Patient and Family Stated Goals less pain  Date of Onset  (abscess present on admission)  Prior Treatments s/p I&D 5/7  Evaluation and Treatment  Evaluation and Treatment Procedures Explained to Patient/Family Yes  Evaluation and Treatment Procedures agreed to  Wound / Incision (Open or Dehisced) 11/13/18 Incision - Open Buttocks Left L buttocks wound s/p I&D by Surgery 11/09/18. **HYDRO/PT**  Date First Assessed/Time First Assessed: 11/13/18 1500   Wound Type: Incision - Open  Location: Buttocks  Location Orientation: Left  Wound Description (Comments): L buttocks wound s/p I&D by Surgery 11/09/18. **HYDRO/PT**  Dressing Type Barrier Film (skin prep);Gauze ;Moist to dry;ABD;Impregnated gauze (petrolatum) (Kerlix packing)  Dressing Changed Changed  Dressing Status Old drainage  Dressing Change Frequency Twice a day  Site / Wound Assessment Black;Brown;Yellow;Pink  % Wound base Red or Granulating 70%  % Wound base Yellow/Fibrinous Exudate 15%  % Wound base Black/Eschar 15%  Peri-wound Assessment Excoriated  Margins Unattached edges (unapproximated)  Drainage Amount Moderate  Drainage Description Serosanguineous  Treatment Debridement (Selective);Hydrotherapy (Pulse lavage);Packing (Saline gauze)  Hydrotherapy  Pulsed lavage therapy - wound location L medial buttocks  Pulsed Lavage with Suction (psi) 8 psi  Pulsed Lavage with Suction - Normal Saline Used 1000 mL  Pulsed Lavage Tip Tip with splash shield  Selective Debridement  Selective Debridement - Location  (No debridement today-low hgb-getting blood-will resume tomorrow)  Wound Therapy - Assess/Plan/Recommendations  Wound Therapy - Clinical Statement 78 yo female admitted with L gluteal/buttock abscess. S/P I&D 11/09/18.   Wound Therapy - Functional Problem List limited mobility  Factors Delaying/Impairing Wound  Healing Diabetes Mellitus;Multiple medical problems;Immobility;Incontinence  Hydrotherapy Plan Debridement;Dressing change;Patient/family education;Pulsatile lavage with suction  Wound Therapy - Frequency 6X / week  Wound Therapy - Current Recommendations Case manager/social work  Wound Therapy - Follow Up Recommendations Skilled nursing facility  Wound Plan Per new order from general surgery, will resume daily hydro treatments. Pt may not need hydrotherapy at SNF if wound continues to progress well.   Wound Therapy Goals - Improve the function of patient's integumentary system by progressing the wound(s) through the phases of wound healing by:  Decrease Necrotic Tissue to 20%  Decrease Necrotic Tissue - Progress Progressing toward goal  Increase Granulation Tissue to 80%  Increase Granulation Tissue - Progress Progressing toward goal  Improve Drainage Characteristics Min  Improve Drainage Characteristics - Progress Progressing toward goal  Goals/treatment plan/discharge plan were made with and agreed upon by patient/family Yes  Time For Goal Achievement 2 weeks  Wound Therapy - Potential for Goals Good   [name]  PTA Acute  Rehabilitation Services Pager      [phone] Office      [phone]

## 2018-11-23 NOTE — Progress Notes (Signed)
OT Cancellation Note  Patient Details Name: Emily Hughes MRN: [id number] DOB: [birth year]   Cancelled Treatment:    Reason Eval/Treat Not Completed: Fatigue/lethargy limiting ability to participate.  Politely declined OT due to fatique this afternoon. Asked if I would return tomorrow.  [place] 11/23/2018, 1:30 PM  Lesle Chris, OTR/L Acute Rehabilitation Services [phone] [facility] pager [phone] office 11/23/2018

## 2018-11-23 NOTE — TOC Progression Note (Signed)
Transition of Care [facility]) - Progression Note    Patient Details  Name: Emily Hughes MRN: [id number] Date of Birth: 1941-02-23  Transition of Care [facility]) CM/SW Contact  Servando Snare, [place] Phone Number: 11/23/2018, 2:51 PM  Clinical Narrative:   LCSW discussed bed offers with patient. Patient asked LCSW to discuss with her sister. LCSW spoke with sister regarding bed offers. Sister wants to discuss bed offers with other family members before making a decision. LCSW explained that none of the facilities that made bed offers offer hydrotherapy.     Expected Discharge Plan: [place] Barriers to Discharge: Continued Medical Work up, SNF Pending bed offer, Ship broker  Expected Discharge Plan and Services Expected Discharge Plan: [place] Choice: [place] arrangements for the past 2 months: Single Family Home                                       Social Determinants of Health (SDOH) Interventions    Readmission Risk Interventions No flowsheet data found.

## 2018-11-23 NOTE — Progress Notes (Signed)
Nutrition Follow-up  DOCUMENTATION CODES:   Morbid obesity  INTERVENTION:   -Continue Ensure Enlive BID, each supplement provides 350 kcal and 20 grams of protein. -Continue Juven BID, each packet provides 90 calories, 2.5 grams of protein, 8 grams of carbohydrate, and 14 grams of amino acids; supplement contains CaHMB, glutamine, and arginine, to promote wound healing.  NUTRITION DIAGNOSIS:   Increased nutrient needs related to acute illness, wound healing as evidenced by estimated needs.  Ongoing.  GOAL:   Patient will meet greater than or equal to 90% of their needs  Progressing.  MONITOR:   PO intake, Supplement acceptance, Labs, Weight trends, Skin  ASSESSMENT:   78 year old female with a past medical history significant for type 2 DM, CKD stage 4, hypothyroidism, CAD, GERD, HTN, hyperlipidemia, history of seizure disorder, and ?Afib. She presented to the ED on 5/6 d/t 3 day history of pain and swelling in L buttocks and fever of up to 102 degrees. She was not taking insulin x2 days PTA and has not been checking CBGs regularly. She was admitted for sepsis 2/2 abscess of the left buttock and DKA. She was noted to be in Afib/A Flutter which then converted to NSR. Cardiology was consulted for preoperative clearance and patient was taken for I&D of L buttocks abscess. LFTs were elevated and RUQ ultrasound was done and showed possible acute cholecystitis-s/p HIDA scan was normal and it was felt elevated LFTs were reactionary. Head CT on 5/10 d/t increased lethargy and showed mild atrophy and white matter microvascular disease.  **RD working remotely**  Patient currently consuming 25-50% of meals at this time. Taking supplements inconsistently over the past 2 days. Pt continues to await SNF placement.  Per weight records, weights have increased +30 lbs since 5/8. Per I/O's: -9.3L since 5/7. Pt is receiving Lasix.  Medications: Lasix tablet BID  Labs reviewed: CBGs: 188-230 Low  Na Elevated K  GFR: 19  Diet Order:   Diet Order            Diet heart healthy/carb modified Room service appropriate? Yes; Fluid consistency: Thin  Diet effective now              EDUCATION NEEDS:   No education needs have been identified at this time  Skin:  Skin Assessment: Skin Integrity Issues: Skin Integrity Issues:: Other (Comment) Other: L buttocks abscess s/p I&D on 5/11  Last BM:  5/20  Height:   Ht Readings from Last 1 Encounters:  11/09/18 5\' 3"  (1.6 m)    Weight:   Wt Readings from Last 1 Encounters:  11/23/18 123.9 kg    Ideal Body Weight:  52.3 kg  BMI:  Body mass index is 48.39 kg/m.  Estimated Nutritional Needs:   Kcal:  1785-2010 kcal  Protein:  100-110 grams  Fluid:  >/= 1.8 L/day  Clayton Bibles, MS, RD, LDN [place] Dietitian Pager: [phone] After Hours Pager: [phone]

## 2018-11-24 ENCOUNTER — Inpatient Hospital Stay (HOSPITAL_COMMUNITY): Payer: PPO

## 2018-11-24 LAB — GLUCOSE, CAPILLARY
Glucose-Capillary: 107 mg/dL — ABNORMAL HIGH (ref 70–99)
Glucose-Capillary: 171 mg/dL — ABNORMAL HIGH (ref 70–99)
Glucose-Capillary: 142 mg/dL — ABNORMAL HIGH (ref 70–99)
Glucose-Capillary: 154 mg/dL — ABNORMAL HIGH (ref 70–99)
Glucose-Capillary: 94 mg/dL (ref 70–99)

## 2018-11-24 NOTE — Progress Notes (Signed)
Physical Therapy Treatment Patient Details Name: Emily Hughes MRN: [id number] DOB: [birth year] Today's Date: 11/24/2018    History of Present Illness 78 year old female admitted for wound infection and hyperglycemia. PMH:  DM, CKD, A Fib, CAD, HTN and seizure disorder    PT Comments    Attempted OOB to recliner however pt unable.  General transfer comment: attempted sit to stand x 3 times however pt unable to to off load hips from bed and unable to tolerate standing R LE due to knee pain.  Used Maxi Lift to assist to recliner.  Then used multiple pillows to position to comfort.   Pt is from home alone but has not been OOB during her extended length of stay.  Pt will need ST Rehab at SNF prior to returning home.   Follow Up Recommendations  SNF     Equipment Recommendations  None recommended by PT    Recommendations for Other Services       Precautions / Restrictions Precautions Precautions: Fall Precaution Comments: R knee pain Restrictions Weight Bearing Restrictions: No    Mobility  Bed Mobility Overal bed mobility: Needs Assistance Bed Mobility: Rolling Rolling: Mod assist;+2 for physical assistance;+2 for safety/equipment   Supine to sit: Max assist;+2 for physical assistance;Total assist     General bed mobility comments: side to side rolling plus supine to sit EOB required much assist  Transfers Overall transfer level: Needs assistance Equipment used: Rolling walker (2 wheeled) Transfers: Sit to/from Stand           General transfer comment: attempted sit to stand x 3 times however pt unable to to off load hips from bed and unable to tolerate standing R LE due to knee pain.    Ambulation/Gait             General Gait Details: unable    Stairs             Wheelchair Mobility    Modified Rankin (Stroke Patients Only)       Balance                                            Cognition Arousal/Alertness:  Awake/alert Behavior During Therapy: WFL for tasks assessed/performed Overall Cognitive Status: Within Functional Limits for tasks assessed                                 General Comments: likely baseline      Exercises      General Comments        Pertinent Vitals/Pain Pain Assessment: Faces Faces Pain Scale: Hurts whole lot Pain Location: R knee Pain Descriptors / Indicators: Grimacing;Moaning;Discomfort;Tender Pain Intervention(s): Monitored during session;Repositioned    Home Living                      Prior Function            PT Goals (current goals can now be found in the care plan section) Progress towards PT goals: Progressing toward goals    Frequency           PT Plan Current plan remains appropriate    Co-evaluation              AM-PAC PT "6 Clicks" Mobility   Outcome Measure  Help  needed turning from your back to your side while in a flat bed without using bedrails?: Total Help needed moving from lying on your back to sitting on the side of a flat bed without using bedrails?: Total Help needed moving to and from a bed to a chair (including a wheelchair)?: Total Help needed standing up from a chair using your arms (e.g., wheelchair or bedside chair)?: Total Help needed to walk in hospital room?: Total Help needed climbing 3-5 steps with a railing? : Total 6 Click Score: 6    End of Session Equipment Utilized During Treatment: Gait belt Activity Tolerance: Patient limited by pain;Patient limited by fatigue Patient left: in chair;with chair alarm set Nurse Communication: Mobility status;Need for lift equipment PT Visit Diagnosis: Muscle weakness (generalized) (M62.81);Other abnormalities of gait and mobility (R26.89);Pain;Difficulty in walking, not elsewhere classified (R26.2) Pain - Right/Left: Right Pain - part of body: (knee)     Time: 6314-9702 PT Time Calculation (min) (ACUTE ONLY): 25 min  Charges:   $Therapeutic Activity: 23-37 mins                    [name]  PTA Acute  Rehabilitation Services Pager      [phone] Office      [phone]

## 2018-11-24 NOTE — Progress Notes (Signed)
  HYDROTHERAPY TREATMENT     11/24/18 1045  Subjective Assessment  Subjective PA Will also in room to see.  Slow progress.   "Keep doing what your doing". Appears more bright red bloody when removed dressing.  Will asked about mobility.  Pt has not been OOB.  Patient and Family Stated Goals less pain  Date of Onset  (abscess present on admission)  Prior Treatments s/p I&D 5/7  Evaluation and Treatment  Evaluation and Treatment Procedures Explained to Patient/Family Yes  Evaluation and Treatment Procedures agreed to  Wound / Incision (Open or Dehisced) 11/13/18 Incision - Open Buttocks Left L buttocks wound s/p I&D by Surgery 11/09/18. **HYDRO/PT**  Date First Assessed/Time First Assessed: 11/13/18 1500 Wound Type: Incision - Open Location: Buttocks Location Orientation: Left Wound Description (Comments): L buttocks wound s/p I&D by Surgery 11/09/18. **HYDRO/PT**  Dressing Type Barrier Film (skin prep);Gauze ;Moist to dry;ABD;Impregnated gauze (petrolatum) (Kerlix packing)  Dressing Changed Changed  Dressing Status Old drainage  Dressing Change Frequency Twice a day  Site / Wound Assessment Black;Brown;Yellow;Pink  % Wound base Red or Granulating 70%  % Wound base Yellow/Fibrinous Exudate 15%  % Wound base Black/Eschar 15%  Peri-wound Assessment Excoriated  Margins Unattached edges (unapproximated)  Drainage Amount Moderate  Drainage Description Serosanguineous  Treatment Debridement (Selective);Hydrotherapy (Pulse lavage);Packing (Saline gauze)  Hydrotherapy  Pulsed lavage therapy - wound location L medial buttocks  Pulsed Lavage with Suction (psi) 8 psi  Pulsed Lavage with Suction - Normal Saline Used 1000 mL  Pulsed Lavage Tip Tip with splash shield  Selective Debridement  Selective Debridement - Location  (No debridement today-low hgb-getting blood-will resume tomorrow)  Wound Therapy - Assess/Plan/Recommendations  Wound Therapy - Clinical Statement 78 yo female  admitted with L gluteal/buttock abscess. S/P I&D 11/09/18.   Wound Therapy - Functional Problem List limited mobility  Factors Delaying/Impairing Wound Healing Diabetes Mellitus;Multiple medical problems;Immobility;Incontinence  Hydrotherapy Plan Debridement;Dressing change;Patient/family education;Pulsatile lavage with suction  Wound Therapy - Frequency 6X / week  Wound Therapy - Current Recommendations Case manager/social work  Wound Therapy - Follow Up Recommendations Skilled nursing facility  Wound Plan Per new order from general surgery, will resume daily hydro treatments. Pt may not need hydrotherapy at SNF if wound continues to progress well.   Wound Therapy Goals - Improve the function of patient's integumentary system by progressing the wound(s) through the phases of wound healing by:  Decrease Necrotic Tissue to 20%  Decrease Necrotic Tissue - Progress Progressing toward goal  Increase Granulation Tissue to 80%  Increase Granulation Tissue - Progress Progressing toward goal  Improve Drainage Characteristics Min  Improve Drainage Characteristics - Progress Progressing toward goal  Goals/treatment plan/discharge plan were made with and agreed upon by patient/family Yes  Time For Goal Achievement 2 weeks  Wound Therapy - Potential for Goals Good   [name]  PTA Acute  Rehabilitation Services Pager      [phone] Office      [phone]

## 2018-11-24 NOTE — Progress Notes (Addendum)
15 Days Post-Op    CC:  Gluteal abscess  Subjective: Pt has a hard time turning, complaining of knee pain today, she has a hx of gout.  Buttocks abscess picture below.    Objective: Vital signs in last 24 hours: Temp:  [98.5 F (36.9 C)-101.6 F (38.7 C)] 98.5 F (36.9 C) (05/22 0530) Pulse Rate:  [76-90] 76 (05/22 0530) Resp:  [16-18] 16 (05/22 0530) BP: (121-133)/(53-65) 123/59 (05/22 0530) SpO2:  [94 %-100 %] 100 % (05/22 0530) Weight:  [119.1 kg] 119.1 kg (05/22 0500) Last BM Date: 11/21/18  Intake/Output from previous day: 05/21 0701 - 05/22 0700 In: 505 [P.O.:505] Out: 1150 [Urine:1150] Intake/Output this shift: No intake/output data recorded.  General appearance: alert, cooperative and no distress Skin: Skin color, texture, turgor normal. No rashes or lesions or the open wound is below.  I would recommend ongoing hydrotherapy with local wound debridement.    Pt is lying on her right side, this is her left buttocks.  Most of the wound looks pretty good, there is one area at the 3-5 o'clock position that has some non viable muscle.  Nothing that need any acute debridement.  She needs ongoing active wound care.  Hopefully this will develop granular tissue below it and the non viable tissue will come out with local wound care.  I would continue hydrotherapy/with local debridement of this area.  No tracking or reason for her to go to the OR.  Lab Results:  Recent Labs    11/22/18 0615 11/23/18 0702  WBC 9.6 10.3  HGB 6.9* 7.9*  HCT 21.8* 25.4*  PLT 417* 413*    BMET Recent Labs    11/22/18 0615 11/23/18 0702  NA 131* 133*  K 5.5* 5.5*  CL 102 104  CO2 21* 22  GLUCOSE 258* 195*  BUN 78* 75*  CREATININE 2.86* 2.72*  CALCIUM 8.6* 8.7*   PT/INR No results for input(s): LABPROT, INR in the last 72 hours.  Recent Labs  Lab 11/23/18 0702  AST 67*  ALT 65*  ALKPHOS 71  BILITOT 0.8  PROT 5.9*  ALBUMIN 1.9*     Lipase     Component Value Date/Time   LIPASE 21 06/24/2014 0816     Medications: . allopurinol  300 mg Oral Daily  . aspirin EC  81 mg Oral Daily  . calcitRIOL  0.25 mcg Oral Daily  . Chlorhexidine Gluconate Cloth  6 each Topical Daily  . cloNIDine  0.1 mg Oral BID  . feeding supplement (ENSURE ENLIVE)  237 mL Oral BID BM  . furosemide  40 mg Oral BID  . insulin aspart  0-15 Units Subcutaneous TID WC  . insulin aspart  5 Units Subcutaneous TID WC  . insulin glargine  25 Units Subcutaneous Daily  . levothyroxine  50 mcg Oral Q0600  . mouth rinse  15 mL Mouth Rinse BID  . metoprolol succinate  50 mg Oral Daily  . nutrition supplement (JUVEN)  1 packet Oral BID BM  . pantoprazole  40 mg Oral Q0600  . patiromer  8.4 g Oral Daily  . pravastatin  80 mg Oral QHS  . sodium bicarbonate  1,300 mg Oral TID  . tamsulosin  0.4 mg Oral QPC breakfast   Anti-infectives (From admission, onward)   Start     Dose/Rate Route Frequency Ordered Stop   11/14/18 2200  Ampicillin-Sulbactam (UNASYN) 3 g in sodium chloride 0.9 % 100 mL IVPB     3 g 200 mL/hr  over 30 Minutes Intravenous Every 12 hours 11/14/18 1219 11/19/18 2254   11/13/18 2200  metroNIDAZOLE (FLAGYL) tablet 500 mg  Status:  Discontinued     500 mg Oral Every 8 hours 11/13/18 1356 11/14/18 1218   11/10/18 2200  vancomycin (VANCOCIN) IVPB 750 mg/150 ml premix  Status:  Discontinued     750 mg 150 mL/hr over 60 Minutes Intravenous Every 48 hours 11/08/18 2126 11/11/18 1451   11/09/18 1000  ceFEPIme (MAXIPIME) 2 g in sodium chloride 0.9 % 100 mL IVPB  Status:  Discontinued     2 g 200 mL/hr over 30 Minutes Intravenous Every 24 hours 11/09/18 0901 11/14/18 1218   11/08/18 2200  ceFEPIme (MAXIPIME) 2 g in sodium chloride 0.9 % 100 mL IVPB  Status:  Discontinued     2 g 200 mL/hr over 30 Minutes Intravenous Every 24 hours 11/08/18 2126 11/09/18 0901   11/08/18 2030  metroNIDAZOLE (FLAGYL) IVPB 500 mg  Status:  Discontinued     500 mg 100 mL/hr over 60 Minutes Intravenous Every 8  hours 11/08/18 2022 11/13/18 1356   11/08/18 1645  vancomycin (VANCOCIN) 2,500 mg in sodium chloride 0.9 % 500 mL IVPB     2,500 mg 250 mL/hr over 120 Minutes Intravenous  Once 11/08/18 1616 11/08/18 1941   11/08/18 1615  vancomycin (VANCOCIN) IVPB 1000 mg/200 mL premix  Status:  Discontinued     1,000 mg 200 mL/hr over 60 Minutes Intravenous  Once 11/08/18 1600 11/08/18 1616   11/08/18 1615  cefTRIAXone (ROCEPHIN) 2 g in sodium chloride 0.9 % 100 mL IVPB     2 g 200 mL/hr over 30 Minutes Intravenous  Once 11/08/18 1600 11/08/18 1712     Assessment/Plan DKA Type 2 diabetes Chronic on acute;stage IVkidney disease Hypertension Elevated troponin/Atrial fibrillation Hx of palpitations GERD Hx of seizures 06/2015 Morbid obesity BMI 43.5  Sepsis secondary to below Left buttocks/perianal abscess             -Incision and drainage of left buttocks abscess 11/09/2018, DR. Coralie Keens             -Continue local wound care, decrease freq. Of hydrotherapy to M/W/F             - cx grew pan-sensitive E.coli -wound cleaning up well, ok to stop abx from our standpoint Leukocytosis - WBC still elevated at 22.1, afebrile, no hypoTN or tachycardia Diarrhea - hold laxatives and insert rectal tube  FEN:HH/CM diet ID: Rocephin/vanc5/6;Maxipime/flagyl5/7- 5/12;Unasyn 5/12 - 11/19/18 DVT: SCDs/can be on chemical DVT prophylaxis from our standpoint Follow-up: TBD POC: Carlena Sax -[phone]  Plan:  Most of the wound looks pretty good, there is one area at the 3-5 o'clock position that has some non viable muscle.  Nothing that need any acute debridement.  She needs ongoing active wound care.  Hopefully this will develop granular tissue below it and the non viable tissue will come out with local wound care.    I would continue hydrotherapy/with local debridement of this area.  No tracking or reason for her to go to the OR.Marland Kitchen  She will need ongoing care at [facility].  I would have  her follow up at a wound care clinic if the SNF does not have staff to care for this. She can get into the shower from our standpoint and allow soap and water into the site, then redress after shower.      LOS: 16 days   JENNINGS,WILLARD 11/24/2018 [phone]  Agree  with above.  Alphonsa Overall, MD, [facility] Pager: [phone] Office phone:  [phone]

## 2018-11-24 NOTE — Progress Notes (Signed)
PROGRESS NOTE    Emily Hughes  GEZ:[id number] DOB: 08-04-1940 DOA: 11/08/2018 PCP: Susy Frizzle, MD  Brief Narrative: 78 year old African-American female with a past medical history significant for but not limited to diabetes mellitus type 2, CKD stage IV, hypothyroidism, CAD, GERD, hypertension, hyperlipidemia, history of seizure disorder, ? Atrial fibrillation, long with comorbidities who presented to the emergency room with a 3-day history of pain and swelling in her buttocks along with a fever of 102. Family reportedly gave her some Tylenol that helped her improve her temperature down to 99 but she is found to have elevated blood pressures for last few days when in the 400s. She has not been taking her insulin for last 2 days and has not been checking her glucose regularly. On admission she stated that the left buttock pain lasted for last few days and was severe 10 out of 10 and reportedly was a boil that then progressed.  She was worked up and admitted for sepsis secondary to abscess of the left buttock as well as DKA. She was placed in the stepdown unit and was placed on glucose stabilizer and has improvement in her blood sugars. She was incidentally noted to be in atrial fibrillation/A Flutter which then converted to NSR a few days ago. Cardiology was consulted for preoperative clearance and patient was taken for incision and drainage of her abscess on her left buttock and she is POD2. DKA improved and she was transitioned to Long Acting Insulin even though Her CO2 is not >20 given that she has chronic Metabolic Acidosis 2/2 to her Renal Disease and is on Sodium Bicarbonate. She tolerated her Diet without issues but complained of some Abdominal Pain.   Liver function tests were elevated and right upper quadrant ultrasound was done and showed possible acute cholecystitis. I discussed with general surgery Dr. Jens Som who recommended obtaining a HIDA scan however HIDA scan with  EF is not able to be done at this time and if EF is needed to be done will come will be on Tuesday per nursing. So will obtain a HIDA scan without the EF currently. And this HIDA scan was normal hepatobiliary scan so Dr. Windle Guard was less inclined for acute cholecystitis and felt that her LFTs being elevated could be reactionary. We will continue to monitor closely and antibiotics have been de-escalated to just IV cefepime Flagyl for now and will continue to de-escalate accordingly. Echocardiogram was done yesterday  Assessment & Plan:   Principal Problem:   Sepsis ([place]) Active Problems:   Essential hypertension   CAD (coronary artery disease)   GERD (gastroesophageal reflux disease)   DKA (diabetic ketoacidoses) (HCC)   Chronic kidney disease (CKD), stage IV (severe) (HCC)   Abscess of left buttock   Atrial fibrillation with RVR (HCC)   Elevated troponin   Abnormal LFTs   Acute renal failure superimposed on stage 4 chronic kidney disease (HCC)   Acute metabolic encephalopathy   Cellulitis and abscess of buttock   Acute urinary retention  1 sepsis secondary to left buttocks/gluteal abscess status post incision and drainageof abscess POD #12 Patient was admitted meeting sepsis criteria with fever, leukocytosis, tachycardia(new onset atrial fibrillation). Sepsis protocol was initiated in the ED. Sepsis physiology improving as patient currently afebrile. Tachycardia improving.Leukocytosis resolved.Blood cultures with no growth to date x7 days. Urine culture is negative. MRSA PCR negative. COVID-19 negative. Cultures from left buttocks abscess positive for E. coli which is pansensitive. Patient was seen in consultation by general surgery and  underwent incision and drainage of abscess with cultures sent. Procalcitonin level was 6.89 and trended down to 4.82. Lactic acid level trended down from 2.9-1.0. Patient initially placed on IV vancomycin, IV cefepime and IV metronidazole.  Antibiotics narrowed down patient currently on IV Unasyn. Patient currently receiving hydrotherapy which was started on 11/14/2018. Patient also on twice daily wet-to-dry dressing changes per general surgery. Status post 10 days of IV antibiotics post incision and drainage. Per general surgery. Follow.Surgery planning daily hydrotherapy and bedside debridement as needed.   We cannot find a SNF that accepts hydrotherapy so she will have to stay in house as long as she is on hydrotherapy recommended by surgery.  Her wound is improving with hydrotherapy compared to the pictures from today to few days ago.    2. Diabetes mellitus type 2/DKA Patient had presented in DKA felt secondary to problem #1. Hemoglobin A1c 8.4. On admission patient noted to have a beta hydroxybutyric acid of 2.29. Patient was placed on the glucose stabilizer and subsequently transition to subcutaneous Lantus. Increase Lantus to 25 units daily increase NovoLog to 5 units 3 times a day with meals for better control of blood sugar.   3 New onset/Transient atrial fibrillation/atrial flutter with variable block status post conversion to normal sinus rhythm with transient third-degree AV block.?   Felt likely in the setting of sepsis secondary to problem #1. Patient converted to normal sinus rhythm with a short episode of third-degree AV block overnight while asleep thought possibly secondary to AV nodal blocking drugs and sleep apnea. Patient initially on a Cardizem drip and subsequently transitioned to oral Cardizem which has subsequently been discontinued. Patient seen in consultation by cardiology who feels no need for anticoagulation at this time given short duration and need for surgical intervention. Continue current regimen of Toprol-XL 50 mg daily. Appreciate cardiology input and recommendations.   5. Hypertension blood pressure better.  Continue Toprol and clonidine Norvasc has been stopped yesterday.  6.  Elevated troponin Felt secondary to demand ischemia in the setting of sepsis, elevated lactic acid and acute on chronic kidney failure. EKG with question of flutter versus sinus tach with PACs. Repeat EKG showed a normal sinus rhythm and evidence of LVH with no signs of ischemia noted. Patient seen by cardiology who felt no further ischemic work-up needed at this time. Continue aspirin 81 mg daily, Toprol-XL, statin. Follow.  7Acute on chronic kidney disease stage IV/chronic metabolic acidosis Felt secondary to prerenal azotemia in the setting of sepsis. Patient on admission noted to have a creatinine of 4.76. Patient placed on IV fluids which have subsequently been discontinued. Renal function improved and currently close to baseline. Creatinine at 2.76 Continue calcitriol. Acidosis improved with increased bicarb tablets of 1300 mg p.o. 3 times daily. Patient with some lower extremity edema. Placed on Lasix 40 mg orally twice daily. Patient was on Lasix 80 mg in the morning and 40 mg at bedtime as her home medication dose. Outpatient follow-up with nephrology.   8. Hypothyroidism TSH of 1.815 on 11/09/2018. Synthroid.   9. Coronary artery disease Currently stable. Patient denies any acute chest pain. Patient was seen by cardiology during the hospitalization secondary to new onset atrial flutter with variable block. Continue current cardiac medications of aspirin, Toprol-XL, statin, Norvasc. Outpatient follow-up with cardiology.  10. Hyponatremiasodium 133 avoid hypotonic fluids and monitor.   11. Hypermagnesemiafollow labs tomorrow.  12. Acute encephalopathy/somnolence and lethargy-is more awake and alert today asked me to talk to her sister and  update her sister..  13. Morbid obesity Weight loss and dietary counseling given.  14. History of gout Continue allopurinol.  15. Abnormal LFTs Likely reactive. LFTs trended down. Acute hepatitis panel was  negative. Right upper quadrant ultrasound showed gallbladder sludge and positive sonographic Murphy sign however no biliary duct dilatation. HIDA scan which was done was normal. Dr. Alfredia Ferguson discussed the case with general surgery who recommended no further intervention at this time and to continue to trend hepatic function  16. Normocytic anemia/anemia of chronic kidney disease Likely dilutional and postoperative drop. Hemoglobin was 11.4 and dropped to 7.7 and now at 7.7.  He received 1 unit of blood transfusion yesterday her hemoglobin went up to 7.9 from 6.9.   17. Leukocytosisresolved  18. gastroesophageal reflux disease Continue PPI.  19. Loose stoolsRN reports still some loose stools today stool softeners had been stopped yesterday.  20. Hyperkalemia 5.5 continue Veltassa  21. urinary retention Foley catheter placed back in due to urinary retention. Continue Flomax. DC Foley catheter.  DVT prophylaxis:SCDs Code Status:Full Family Communication:dw sister [phone] Disposition Plan:SNF on dischargewhen okay with general surgery..  Consultants:  Cardiology: Dr. Meda Coffee 11/09/2018  General surgery: Dr. Marlou Starks III 11/08/2018  Procedures:  CT head 11/12/2018  CT pelvis 11/08/2018  Chest x-ray 11/08/2018, 11/12/2018, 11/13/2018, 11/15/2018  Plain films of the left foot 11/12/2018  Limited echocardiogram 11/11/2018  Right upper quadrant ultrasound 11/11/2018  Incision and drainage of left buttocks abscess 11/09/2018 per Dr. Ninfa Linden  EEG 11/15/2018  Antimicrobials:   Oral Flagyl 11/13/2018>>>> 11/14/2018  IV Unasyn 11/14/2018>>>> 11/19/2018  IV cefepime 11/08/2018>>>> 11/14/2018  IV Flagyl 11/08/2018>>>>> 11/13/2018 IV vancomycin 11/08/2018>>>> 11/11/2018  Nutrition Problem: Increased nutrient needs Etiology: acute illness, wound healing     Signs/Symptoms: estimated needs    Interventions: Juven, Ensure Enlive (each supplement provides 350kcal and 20  grams of protein)  Estimated body mass index is 46.51 kg/m as calculated from the following:   Height as of this encounter: 5\' 3"  (1.6 m).   Weight as of this encounter: 119.1 kg.   Subjective: Patient is resting in bed she still has some loose bowel movements even dose stool softeners have been stopped denies any nausea or vomiting Objective: Vitals:   11/23/18 2134 11/23/18 2300 11/24/18 0500 11/24/18 0530  BP: (!) 121/53   (!) 123/59  Pulse: 90   76  Resp: 16   16  Temp: (!) 101.6 F (38.7 C) 100 F (37.8 C) 98.8 F (37.1 C) 98.5 F (36.9 C)  TempSrc:  Oral Oral   SpO2: 94%   100%  Weight:   119.1 kg   Height:        Intake/Output Summary (Last 24 hours) at 11/24/2018 1110 Last data filed at 11/24/2018 0600 Gross per 24 hour  Intake 265 ml  Output 550 ml  Net -285 ml   Filed Weights   11/22/18 0624 11/23/18 0552 11/24/18 0500  Weight: 121.9 kg 123.9 kg 119.1 kg    Examination:  General exam: Appears calm and comfortable  Respiratory system: Clear to auscultation. Respiratory effort normal. Cardiovascular system: S1 & S2 heard, RRR. No JVD, murmurs, rubs, gallops or clicks. No pedal edema. Gastrointestinal system: Abdomen is nondistended, soft and nontender. No organomegaly or masses felt. Normal bowel sounds heard. Central nervous system: Alert and oriented. No focal neurological deficits. Extremities: 1+ right lower extremity edema trace left lower extremity edema Skin: No rashes, lesions or ulcers Psychiatry: Judgement and insight appear normal. Mood & affect appropriate.  Data Reviewed: I have personally reviewed following labs and imaging studies  CBC: Recent Labs  Lab 11/18/18 0629 11/19/18 0525 11/20/18 0459 11/21/18 0817 11/22/18 0615 11/23/18 0702  WBC 17.0* 15.9* 13.5* 13.1* 9.6 10.3  NEUTROABS 12.9* 11.7* 9.8* 9.8*  --   --   HGB 8.7* 8.2* 7.3* 7.7* 6.9* 7.9*  HCT 28.4* 26.3* 23.3* 24.7* 21.8* 25.4*  MCV 86.3 87.4 86.0 88.2 86.2 86.7    PLT 416* 408* 396 402* 417* 710*   Basic Metabolic Panel: Recent Labs  Lab 11/19/18 0525 11/20/18 0459 11/21/18 0817 11/22/18 0615 11/23/18 0702  NA 136 134* 134* 131* 133*  K 4.8 5.1 5.1 5.5* 5.5*  CL 109 107 107 102 104  CO2 18* 21* 19* 21* 22  GLUCOSE 140* 215* 200* 258* 195*  BUN 72* 68* 69* 78* 75*  CREATININE 2.44* 2.61* 2.86* 2.86* 2.72*  CALCIUM 8.8* 8.4* 8.7* 8.6* 8.7*  MG  --   --   --   --  2.3   GFR: Estimated Creatinine Clearance: 22 mL/min (A) (by C-G formula based on SCr of 2.72 mg/dL (H)). Liver Function Tests: Recent Labs  Lab 11/23/18 0702  AST 67*  ALT 65*  ALKPHOS 71  BILITOT 0.8  PROT 5.9*  ALBUMIN 1.9*   No results for input(s): LIPASE, AMYLASE in the last 168 hours. No results for input(s): AMMONIA in the last 168 hours. Coagulation Profile: No results for input(s): INR, PROTIME in the last 168 hours. Cardiac Enzymes: No results for input(s): CKTOTAL, CKMB, CKMBINDEX, TROPONINI in the last 168 hours. BNP (last 3 results) No results for input(s): PROBNP in the last 8760 hours. HbA1C: No results for input(s): HGBA1C in the last 72 hours. CBG: Recent Labs  Lab 11/23/18 0739 11/23/18 1127 11/23/18 1651 11/23/18 2138 11/24/18 0735  GLUCAP 188* 230* 146* 95 107*   Lipid Profile: No results for input(s): CHOL, HDL, LDLCALC, TRIG, CHOLHDL, LDLDIRECT in the last 72 hours. Thyroid Function Tests: No results for input(s): TSH, T4TOTAL, FREET4, T3FREE, THYROIDAB in the last 72 hours. Anemia Panel: No results for input(s): VITAMINB12, FOLATE, FERRITIN, TIBC, IRON, RETICCTPCT in the last 72 hours. Sepsis Labs: No results for input(s): PROCALCITON, LATICACIDVEN in the last 168 hours.  No results found for this or any previous visit (from the past 240 hour(s)).       Radiology Studies: No results found.      Scheduled Meds:  allopurinol  300 mg Oral Daily   aspirin EC  81 mg Oral Daily   calcitRIOL  0.25 mcg Oral Daily    Chlorhexidine Gluconate Cloth  6 each Topical Daily   cloNIDine  0.1 mg Oral BID   feeding supplement (ENSURE ENLIVE)  237 mL Oral BID BM   furosemide  40 mg Oral BID   insulin aspart  0-15 Units Subcutaneous TID WC   insulin aspart  5 Units Subcutaneous TID WC   insulin glargine  25 Units Subcutaneous Daily   levothyroxine  50 mcg Oral Q0600   mouth rinse  15 mL Mouth Rinse BID   metoprolol succinate  50 mg Oral Daily   nutrition supplement (JUVEN)  1 packet Oral BID BM   pantoprazole  40 mg Oral Q0600   patiromer  8.4 g Oral Daily   pravastatin  80 mg Oral QHS   sodium bicarbonate  1,300 mg Oral TID   tamsulosin  0.4 mg Oral QPC breakfast   Continuous Infusions:   LOS: 16 days  Georgette Shell, MD [facility]  If 7PM-7AM, please contact night-coverage [email or web address] Password [email or web address] 11/24/2018, 11:10 AM

## 2018-11-24 NOTE — Progress Notes (Signed)
OT Cancellation Note  Patient Details Name: Emily Hughes MRN: [id number] DOB: August 14, 1940   Cancelled Treatment:    Reason Eval/Treat Not Completed: Other (comment). Upon entering, Pt shaking.  RN called to room and checked temp and CBG.  Notified RN who states that pt has been shaking all day. PT reports she was not shaking when they worked with her. Will check back next week.  Sharlena Kristensen 11/24/2018, 3:48 PM  Lesle Chris, OTR/L Acute Rehabilitation Services [phone] [facility] pager [phone] office 11/24/2018

## 2018-11-24 NOTE — Progress Notes (Signed)
CSW contacted Olivia Mackie with [facility] to see if they had discussed patient for possible admission. Olivia Mackie states her team will be meeting about patient today and will follow up.   Golden Circle, LCSW Transitions of Care Department [facility] ED [phone]

## 2018-11-25 DIAGNOSIS — M1009 Idiopathic gout, multiple sites: Secondary | ICD-10-CM

## 2018-11-25 DIAGNOSIS — E43 Unspecified severe protein-calorie malnutrition: Secondary | ICD-10-CM

## 2018-11-25 LAB — COMPREHENSIVE METABOLIC PANEL
ALT: 60 U/L — ABNORMAL HIGH (ref 0–44)
AST: 63 U/L — ABNORMAL HIGH (ref 15–41)
Albumin: 2 g/dL — ABNORMAL LOW (ref 3.5–5.0)
Alkaline Phosphatase: 79 U/L (ref 38–126)
Anion gap: 10 (ref 5–15)
BUN: 70 mg/dL — ABNORMAL HIGH (ref 8–23)
CO2: 24 mmol/L (ref 22–32)
Calcium: 9.1 mg/dL (ref 8.9–10.3)
Chloride: 100 mmol/L (ref 98–111)
Creatinine, Ser: 2.75 mg/dL — ABNORMAL HIGH (ref 0.44–1.00)
GFR calc Af Amer: 19 mL/min — ABNORMAL LOW (ref >60)
GFR calc non Af Amer: 16 mL/min — ABNORMAL LOW (ref >60)
Glucose, Bld: 84 mg/dL (ref 70–99)
Potassium: 5.3 mmol/L — ABNORMAL HIGH (ref 3.5–5.1)
Sodium: 134 mmol/L — ABNORMAL LOW (ref 135–145)
Total Bilirubin: <0.1 mg/dL — ABNORMAL LOW (ref 0.3–1.2)
Total Protein: 6.4 g/dL — ABNORMAL LOW (ref 6.5–8.1)

## 2018-11-25 LAB — GLUCOSE, CAPILLARY
Glucose-Capillary: 83 mg/dL (ref 70–99)
Glucose-Capillary: 208 mg/dL — ABNORMAL HIGH (ref 70–99)
Glucose-Capillary: 190 mg/dL — ABNORMAL HIGH (ref 70–99)
Glucose-Capillary: 191 mg/dL — ABNORMAL HIGH (ref 70–99)

## 2018-11-25 LAB — CBC
HCT: 26.8 % — ABNORMAL LOW (ref 36.0–46.0)
Hemoglobin: 8.7 g/dL — ABNORMAL LOW (ref 12.0–15.0)
MCH: 28.2 pg (ref 26.0–34.0)
MCHC: 32.5 g/dL (ref 30.0–36.0)
MCV: 87 fL (ref 80.0–100.0)
Platelets: 402 10*3/uL — ABNORMAL HIGH (ref 150–400)
RBC: 3.08 MIL/uL — ABNORMAL LOW (ref 3.87–5.11)
RDW: 16.6 % — ABNORMAL HIGH (ref 11.5–15.5)
WBC: 10.9 10*3/uL — ABNORMAL HIGH (ref 4.0–10.5)
nRBC: 0 % (ref 0.0–0.2)

## 2018-11-25 LAB — C-REACTIVE PROTEIN: CRP: 8.2 mg/dL — ABNORMAL HIGH (ref <1.0)

## 2018-11-25 LAB — URIC ACID: Uric Acid, Serum: 3.7 mg/dL (ref 2.5–7.1)

## 2018-11-25 LAB — SEDIMENTATION RATE: Sed Rate: 116 mm/hr — ABNORMAL HIGH (ref 0–22)

## 2018-11-25 LAB — MAGNESIUM: Magnesium: 2.2 mg/dL (ref 1.7–2.4)

## 2018-11-25 MED ORDER — FEBUXOSTAT 40 MG PO TABS
40.0000 mg | ORAL_TABLET | Freq: Every day | ORAL | Status: DC
  Administered 2018-11-25 – 2018-11-29 (×5): 40 mg via ORAL
  Filled 2018-11-25 (×5): qty 1

## 2018-11-25 MED ORDER — FEBUXOSTAT 40 MG PO TABS: 80.0000 mg | ORAL_TABLET | Freq: Every day | ORAL | Status: DC

## 2018-11-25 MED ORDER — PREDNISONE 20 MG PO TABS
20.0000 mg | ORAL_TABLET | Freq: Every day | ORAL | Status: DC
  Administered 2018-11-25 – 2018-11-28 (×4): 20 mg via ORAL
  Filled 2018-11-25 (×4): qty 1

## 2018-11-25 MED ORDER — COLCHICINE 0.6 MG PO TABS: 0.6000 mg | ORAL_TABLET | Freq: Once | ORAL | Status: DC

## 2018-11-25 NOTE — Consult Note (Signed)
ORTHOPAEDIC CONSULTATION  REQUESTING PHYSICIAN: Georgette Shell, MD  Chief Complaint: bilateral knee and ankle pain for 1 week  HPI: Emily Hughes is a 78 y.o. female who presents with uncontrolled diabetes and severe protein caloric malnutrition with history of gout, lower extremity joint pain, with some improvement after uloric 80 mg  Past Medical History:  Diagnosis Date  . Anemia   . CAD (coronary artery disease)   . Colon polyps   . CRI (chronic renal insufficiency)   . DDD (degenerative disc disease)   . Diabetes mellitus    INSULIN DEPENDENT  . GERD (gastroesophageal reflux disease)   . High cholesterol   . Hypertension   . Pancreatitis   . Proteinuria    Past Surgical History:  Procedure Laterality Date  . ABDOMINAL HYSTERECTOMY    . ANGIOPLASTY    . ANKLE SURGERY Right   . BACK SURGERY    . INCISION AND DRAINAGE ABSCESS N/A 11/09/2018   Procedure: INCISION AND DRAINAGE ABSCESS;  Surgeon: Coralie Keens, MD;  Location: [facility];  Service: General;  Laterality: N/A;  . TONSILLECTOMY     Social History   Socioeconomic History  . Marital status: Widowed    Spouse name: Not on file  . Number of children: Not on file  . Years of education: Not on file  . Highest education level: Not on file  Occupational History  . Occupation: Retired  Scientific laboratory technician  . Financial resource strain: Not on file  . Food insecurity:    Worry: Not on file    Inability: Not on file  . Transportation needs:    Medical: Not on file    Non-medical: Not on file  Tobacco Use  . Smoking status: Former Research scientist (life sciences)  . Smokeless tobacco: Never Used  . Tobacco comment: quit smoking in 2000  Substance and Sexual Activity  . Alcohol use: No    Alcohol/week: 0.0 standard drinks  . Drug use: No  . Sexual activity: Not on file  Lifestyle  . Physical activity:    Days per week: Not on file    Minutes per session: Not on file  . Stress: Not on file  Relationships  . Social  connections:    Talks on phone: Not on file    Gets together: Not on file    Attends religious service: Not on file    Active member of club or organization: Not on file    Attends meetings of clubs or organizations: Not on file    Relationship status: Not on file  Other Topics Concern  . Not on file  Social History Narrative  . Not on file   Family History  Problem Relation Age of Onset  . Diabetes Mother   . Diabetes Father   . Diabetes Sister    - negative except otherwise stated in the family history section Allergies  Allergen Reactions  . Ace Inhibitors   . Actos [Pioglitazone Hydrochloride]   . Dust Mite Extract   . Dye Fdc Red [Erythrosine Red No. 3]   . Fish Allergy    Prior to Admission medications   Medication Sig Start Date End Date Taking? Authorizing Provider  allopurinol (ZYLOPRIM) 100 MG tablet Take 1 tablet (100 mg total) by mouth daily. Take this with the 300 mg to make 400 mg total 09/22/18  Yes Susy Frizzle, MD  allopurinol (ZYLOPRIM) 300 MG tablet Take 1 tablet (300 mg total) by mouth daily. 09/22/18  Yes Jenna Luo  T, MD  aspirin 81 MG tablet Take 81 mg by mouth daily.     Yes [provider]  calcitRIOL (ROCALTROL) 0.25 MCG capsule Take 0.25 mcg by mouth daily.   Yes [provider]  cloNIDine (CATAPRES) 0.1 MG tablet Take 1 tablet (0.1 mg total) by mouth 2 (two) times daily. 07/19/14  Yes Susy Frizzle, MD  colchicine 0.6 MG tablet Take 0.5 tablets (0.3 mg total) by mouth daily. 10/10/18  Yes Susy Frizzle, MD  diltiazem (CARDIZEM CD) 300 MG 24 hr capsule Take 1 capsule (300 mg total) by mouth daily. 10/02/18  Yes Susy Frizzle, MD  furosemide (LASIX) 40 MG tablet TAKE 2 TABLETS IN THE MORNING AND 1 TABLET IN THE EVENING. 08/12/15  Yes Pickard, Cammie Mcgee, MD  glimepiride (AMARYL) 4 MG tablet TAKE 1 TABLET ONCE DAILY. 09/25/18  Yes Susy Frizzle, MD  insulin aspart (NOVOLOG) 100 UNIT/ML FlexPen Inject 10 Units into the  skin daily with lunch. Patient taking differently: Inject 10 Units into the skin daily. Sliding Scale 07/24/14  Yes Susy Frizzle, MD  Insulin Glargine (LANTUS SOLOSTAR) 100 UNIT/ML Solostar Pen INJECT 0.30ML(30 UNITS) INTO THE SKIN DAILY. 09/25/18  Yes Susy Frizzle, MD  levothyroxine (SYNTHROID, LEVOTHROID) 50 MCG tablet Take 1 tablet (50 mcg total) by mouth daily. 10/02/18  Yes Susy Frizzle, MD  pravastatin (PRAVACHOL) 80 MG tablet Take 1 tablet by mouth at bedtime 09/27/18  Yes Susy Frizzle, MD  sodium bicarbonate 650 MG tablet Take 1 tablet (650 mg total) by mouth 4 (four) times daily. 07/19/14  Yes Susy Frizzle, MD  sucralfate (CARAFATE) 1 G tablet Take 1 tablet (1 g total) by mouth 4 (four) times daily -  with meals and at bedtime. 07/11/14  Yes Susy Frizzle, MD  VELTASSA 8.4 g packet Take 8.4 g by mouth daily.  12/20/17  Yes [provider]  B-D ULTRAFINE III SHORT PEN 31G X 8 MM MISC USE WITH LANTUS. 07/31/18   Susy Frizzle, MD  cetirizine (ZYRTEC) 10 MG tablet Take 1 tablet (10 mg total) by mouth daily. Patient not taking: Reported on 11/08/2018 12/04/13   Susy Frizzle, MD  colchicine 0.6 MG tablet TAKE 2 TABLETS NOW AND REPEAT 1 TABLET IN 1 HOUR AS NEEDED FOR GOUT FLARE Patient not taking: Reported on 11/08/2018 08/08/18   Susy Frizzle, MD  diclofenac sodium (VOLTAREN) 1 % GEL Apply 2 g topically 4 (four) times daily. Patient not taking: Reported on 11/08/2018 08/05/16   Susy Frizzle, MD  HYDROcodone-acetaminophen (NORCO) 5-325 MG tablet Take 1 tablet by mouth every 6 (six) hours as needed for moderate pain. Patient not taking: Reported on 11/08/2018 08/09/17   Susy Frizzle, MD  metoprolol succinate (TOPROL-XL) 25 MG 24 hr tablet Take 1 tablet by mouth once daily 09/27/18   Susy Frizzle, MD  mometasone (ELOCON) 0.1 % ointment APPLY TOPICALLY DAILY. Patient not taking: Reported on 11/08/2018 04/25/14   Susy Frizzle, MD  tiZANidine (ZANAFLEX) 2  MG tablet Take 1 tablet (2 mg total) by mouth 3 (three) times daily. Patient not taking: Reported on 11/08/2018 09/29/17   Susy Frizzle, MD   Dg Knee 1-2 Views Left  Result Date: 11/24/2018 CLINICAL DATA:  Diffuse left knee pain EXAM: LEFT KNEE - 1-2 VIEW COMPARISON:  None. FINDINGS: There is no acute displaced fracture or dislocation. There are moderate to severe degenerative changes, greatest within the medial and patellofemoral  compartments. A small to moderate size suprapatellar joint effusion is noted. Nonspecific soft tissue swelling is noted about the lower extremity. IMPRESSION: 1. No acute displaced fracture or dislocation. 2. Advanced osteoarthritis of the left knee. 3. Small to moderate size suprapatellar joint effusion. 4. Nonspecific soft tissue swelling, greatest about the lateral aspect of the knee. Electronically Signed   By: Constance Holster M.D.   On: 11/24/2018 16:09   Dg Knee 1-2 Views Right  Result Date: 11/24/2018 CLINICAL DATA:  Initial evaluation for acute right knee pain. EXAM: RIGHT KNEE - 1-2 VIEW COMPARISON:  None. FINDINGS: No acute fracture or dislocation. Moderate joint effusion likely present within the suprapatellar recess, somewhat difficult to evaluate given patient positioning. Moderate to advanced tricompartmental degenerative osteoarthrosis, most notable at the medial femorotibial and patellofemoral articulations. Osseous mineralization normal. No soft tissue abnormality. IMPRESSION: 1. No acute osseous abnormality about the right knee. 2. Moderate joint effusion. 3. Moderate to advanced tricompartmental degenerative osteoarthrosis. Electronically Signed   By: Jeannine Boga M.D.   On: 11/24/2018 21:32   - pertinent xrays, CT, MRI studies were reviewed and independently interpreted  Positive ROS: All other systems have been reviewed and were otherwise negative with the exception of those mentioned in the HPI and as above.  Physical Exam: General: Alert,  no acute distress Psychiatric: Patient is competent for consent with normal mood and affect Lymphatic: No axillary or cervical lymphadenopathy Cardiovascular: No pedal edema Respiratory: No cyanosis, no use of accessory musculature GI: No organomegaly, abdomen is soft and non-tender    Images:  @ENCIMAGES @  Labs:  Lab Results  Component Value Date   HGBA1C 8.4 (H) 11/11/2018   HGBA1C 7.2 (H) 07/12/2017   HGBA1C 6.8 (H) 05/09/2017   ESRSEDRATE 116 (H) 11/25/2018   CRP 8.2 (H) 11/25/2018   LABURIC 3.7 11/25/2018   REPTSTATUS 11/11/2018 FINAL 11/10/2018   GRAMSTAIN  11/09/2018    ABUNDANT WBC PRESENT,BOTH PMN AND MONONUCLEAR ABUNDANT GRAM POSITIVE COCCI ABUNDANT GRAM NEGATIVE RODS    CULT  11/10/2018    NO GROWTH Performed at [facility], [place] [address], [place], [place] [address]    LABORGA ESCHERICHIA COLI 11/09/2018    Lab Results  Component Value Date   ALBUMIN 2.0 (L) 11/25/2018   ALBUMIN 1.9 (L) 11/23/2018   ALBUMIN 2.0 (L) 11/17/2018   LABURIC 3.7 11/25/2018    Neurologic: Patient does not have protective sensation bilateral lower extremities.   MUSCULOSKELETAL:   Skin: no redness around any of her joints  She has swelling of both ankles and both knees, all joints are equally tender to palpation, no warmth in her joints, the patella femoral join of her knee is more tender than the joint line.  Assessment: Uncontrolled diabetes with severe protein caloric malnutrition, gout, bilateral knee and ankle pain with sacral decubitus ulcer  Plan: Continue uloric, discontinue allopurinol, I can follow up as needed  Thank you for the consult and the opportunity to see Emily Hughes, [place] [phone] 5:12 PM

## 2018-11-25 NOTE — Progress Notes (Signed)
PROGRESS NOTE    Emily Hughes  QIO:[id number] DOB: April 07, 1941 DOA: 11/08/2018 PCP: Susy Frizzle, MD  Brief Narrative: 78 year old African-American female with a past medical history significant for but not limited to diabetes mellitus type 2, CKD stage IV, hypothyroidism, CAD, GERD, hypertension, hyperlipidemia, history of seizure disorder, ? Atrial fibrillation, long with comorbidities who presented to the emergency room with a 3-day history of pain and swelling in her buttocks along with a fever of 102. Family reportedly gave her some Tylenol that helped her improve her temperature down to 99 but she is found to have elevated blood pressures for last few days when in the 400s. She has not been taking her insulin for last 2 days and has not been checking her glucose regularly. On admission she stated that the left buttock pain lasted for last few days and was severe 10 out of 10 and reportedly was a boil that then progressed.  She was worked up and admitted for sepsis secondary to abscess of the left buttock as well as DKA. She was placed in the stepdown unit and was placed on glucose stabilizer and has improvement in her blood sugars. She was incidentally noted to be in atrial fibrillation/A Flutter which then converted to NSR a few days ago. Cardiology was consulted for preoperative clearance and patient was taken for incision and drainage of her abscess on her left buttock and she is POD2. DKA improved and she was transitioned to Long Acting Insulin even though Her CO2 is not >20 given that she has chronic Metabolic Acidosis 2/2 to her Renal Disease and is on Sodium Bicarbonate. She tolerated her Diet without issues but complained of some Abdominal Pain.   Liver function tests were elevated and right upper quadrant ultrasound was done and showed possible acute cholecystitis. I discussed with general surgery Dr. Jens Som who recommended obtaining a HIDA scan however HIDA scan with EF  is not able to be done at this time and if EF is needed to be done will come will be on Tuesday per nursing. So will obtain a HIDA scan without the EF currently. And this HIDA scan was normal hepatobiliary scan so Dr. Windle Guard was less inclined for acute cholecystitis and felt that her LFTs being elevated could be reactionary. We will continue to monitor closely and antibiotics have been de-escalated to just IV cefepime Flagyl for now and will continue to de-escalate accordingly. Echocardiogram was done yesterday  Assessment & Plan:   Principal Problem:   Sepsis ([place]) Active Problems:   Essential hypertension   CAD (coronary artery disease)   GERD (gastroesophageal reflux disease)   DKA (diabetic ketoacidoses) (HCC)   Chronic kidney disease (CKD), stage IV (severe) (HCC)   Abscess of left buttock   Atrial fibrillation with RVR (HCC)   Elevated troponin   Abnormal LFTs   Acute renal failure superimposed on stage 4 chronic kidney disease (HCC)   Acute metabolic encephalopathy   Cellulitis and abscess of buttock   Acute urinary retention   1sepsis secondary to left buttocks/gluteal abscess status post incision and drainageof abscess POD #14 patient continues to get hydrotherapy daily with surgery following for possible debridement if needed.  Blood cultures have been negative.  COVID-19 negative MRSA PCR negative urine culture negative.  Patient received IV antibiotics for 10 days.  2. Diabetes mellitus type 2/DKA Patient had presented in DKA felt secondary to problem #1. Hemoglobin A1c 8.4.  Sugars now stable on Lantus 25 units daily with NovoLog 5  units 3 times a day with meals.   3New onset/Transient atrial fibrillation/atrial flutter with variable block status post conversion to normal sinus rhythm with transient third-degree AV block.?   Felt likely in the setting of sepsis secondary to problem #1. Patient converted to normal sinus rhythm with a short episode of third-degree AV  block overnight while asleep thought possibly secondary to AV nodal blocking drugs and sleep apnea. Patient initially on a Cardizem drip and subsequently transitioned to oral Cardizem which has subsequently been discontinued. Patient seen in consultation by cardiology who feels no need for anticoagulation at this time given short duration and need for surgical intervention. Continue current regimen of Toprol-XL 50 mg daily. Appreciate cardiology input and recommendations.   4 Hypertensionblood pressure better.Continue Toprol and clonidine Norvasc has been stopped yesterday.  5Elevated troponin Felt secondary to demand ischemia in the setting of sepsis, elevated lactic acid and acute on chronic kidney failure. EKG with question of flutter versus sinus tach with PACs. Repeat EKG showed a normal sinus rhythm and evidence of LVH with no signs of ischemia noted. Patient seen by cardiology who felt no further ischemic work-up needed at this time. Continue aspirin 81 mg daily, Toprol-XL, statin. Follow  7Acute on chronic kidney disease stage IV/chronic metabolic acidosis Felt secondary to prerenal azotemia in the setting of sepsis. Patient on admission noted to have a creatinine of 4.76. Patient placed on IV fluids which have subsequently been discontinued. Renal function improved and currently close to baseline. Creatinine at 2.76Continue calcitriol. Acidosis improved with increased bicarb tablets of 1300 mg p.o. 3 times daily. Patient with some lower extremity edema. Placed on Lasix 40 mg orally twice daily. Patient was on Lasix 80 mg in the morning and 40 mg at bedtime as her home medication dose. Outpatient follow-up with nephrology.   8. Hypothyroidism TSH of 1.815 on 11/09/2018. Synthroid.   9. Coronary artery disease Currently stable. Patient denies any acute chest pain. Patient was seen by cardiology during the hospitalization secondary to new onset atrial flutter  with variable block. Continue current cardiac medications of aspirin, Toprol-XL, statin, Norvasc. Outpatient follow-up with cardiology.  10. Hyponatremiaodium 134 getting better 11. Hypermagnesemiafollow labs tomorrow.  Magnesium 2.2.  12. Acute encephalopathy/somnolence and lethargy-is more awake and alert today asked me to talk to her sister and update her sister..  13. Morbid obesity Weight loss and dietary counseling given.  14. History of gout Continue allopurinol.  15. Abnormal LFTs AST ALT stable at 63 and 60 total bilirubin less than 0.1.  16. Normocytic anemia/anemia of chronic kidney disease Likely dilutional and postoperative drop.  Hemoglobin stable at 8.7 she received 1 unit of blood transfusion.   17. Leukocytosisresolved  18. gastroesophageal reflux disease Continue PPI.  19. Loose stoolsresolved after stopping stool softeners.   20. Hyperkalemia5.3 continue Veltassa  21. urinary retention Foley catheter placed back in due to urinary retention. Continue Flomax. DC Foley catheter   22 right knee pain patient was unable to stand on the right foot yesterday with physical therapy x-ray shows effusion and arthritic changes patient reports having a history of gout in that knee patient is on allopurinol 300 mg daily discussed with Dr. Sharol Given will try a dose of febuxostat today and see how she does. DVT prophylaxis:SCDs Code Status:Full Family Communication:dw sister [phone] her sister has picked up [employer] for discharge. Disposition Plan:SNF on dischargewhen okay with general surgery..  Consultants:  Cardiology: Dr. Meda Coffee 11/09/2018  General surgery: Dr. Marlou Starks III  11/08/2018  Procedures:  CT head 11/12/2018  CT pelvis 11/08/2018  Chest x-ray 11/08/2018, 11/12/2018, 11/13/2018, 11/15/2018  Plain films of the left foot 11/12/2018  Limited echocardiogram 11/11/2018  Right upper quadrant ultrasound 11/11/2018  Incision and  drainage of left buttocks abscess 11/09/2018 per Dr. Ninfa Linden  EEG 11/15/2018  Antimicrobials:   Oral Flagyl 11/13/2018>>>> 11/14/2018  IV Unasyn 11/14/2018>>>> 11/19/2018  IV cefepime 11/08/2018>>>> 11/14/2018  IV Flagyl 11/08/2018>>>>> 11/13/2018 IV vancomycin 11/08/2018>>>> 11/11/2018  Nutrition Problem: Increased nutrient needs Etiology: acute illness, wound healing     Signs/Symptoms: estimated needs    Interventions: Juven, Ensure Enlive (each supplement provides 350kcal and 20 grams of protein)  Estimated body mass index is 46.51 kg/m as calculated from the following:   Height as of this encounter: 5\' 3"  (1.6 m).   Weight as of this encounter: 119.1 kg.   Subjective: In bed complains of right knee pain reports history of gout in that knee  Objective: Vitals:   11/24/18 0530 11/24/18 1351 11/24/18 2118 11/25/18 0553  BP: (!) 123/59 130/71 125/60 (!) 117/55  Pulse: 76 90 85 81  Resp: 16 16 16 20   Temp: 98.5 F (36.9 C) 98.9 F (37.2 C) 100.3 F (37.9 C) 99.1 F (37.3 C)  TempSrc:  Oral Oral Oral  SpO2: 100% 98% 97% 97%  Weight:      Height:        Intake/Output Summary (Last 24 hours) at 11/25/2018 1139 Last data filed at 11/25/2018 0325 Gross per 24 hour  Intake 600 ml  Output 1900 ml  Net -1300 ml   Filed Weights   11/22/18 0624 11/23/18 0552 11/24/18 0500  Weight: 121.9 kg 123.9 kg 119.1 kg    Examination:  General exam: Appears calm and comfortable  Respiratory system: Clear to auscultation. Respiratory effort normal. Cardiovascular system: S1 & S2 heard, RRR. No JVD, murmurs, rubs, gallops or clicks. No pedal edema. Gastrointestinal system: Abdomen is nondistended, soft and nontender. No organomegaly or masses felt. Normal bowel sounds heard. Central nervous system: Alert and oriented. No focal neurological deficits. Extremities 1+ pitting edema right knee swollen tender with decreased range of motion compared to the left knee  skin: No rashes, lesions  or ulcers Psychiatry: Judgement and insight appear normal. Mood & affect appropriate.     Data Reviewed: I have personally reviewed following labs and imaging studies  CBC: Recent Labs  Lab 11/19/18 0525 11/20/18 0459 11/21/18 0817 11/22/18 0615 11/23/18 0702 11/25/18 0558  WBC 15.9* 13.5* 13.1* 9.6 10.3 10.9*  NEUTROABS 11.7* 9.8* 9.8*  --   --   --   HGB 8.2* 7.3* 7.7* 6.9* 7.9* 8.7*  HCT 26.3* 23.3* 24.7* 21.8* 25.4* 26.8*  MCV 87.4 86.0 88.2 86.2 86.7 87.0  PLT 408* 396 402* 417* 413* 299*   Basic Metabolic Panel: Recent Labs  Lab 11/20/18 0459 11/21/18 0817 11/22/18 0615 11/23/18 0702 11/25/18 0558  NA 134* 134* 131* 133* 134*  K 5.1 5.1 5.5* 5.5* 5.3*  CL 107 107 102 104 100  CO2 21* 19* 21* 22 24  GLUCOSE 215* 200* 258* 195* 84  BUN 68* 69* 78* 75* 70*  CREATININE 2.61* 2.86* 2.86* 2.72* 2.75*  CALCIUM 8.4* 8.7* 8.6* 8.7* 9.1  MG  --   --   --  2.3 2.2   GFR: Estimated Creatinine Clearance: 21.7 mL/min (A) (by C-G formula based on SCr of 2.75 mg/dL (H)). Liver Function Tests: Recent Labs  Lab 11/23/18 0702 11/25/18 0558  AST 67* 63*  ALT 65* 60*  ALKPHOS 71 79  BILITOT 0.8 <0.1*  PROT 5.9* 6.4*  ALBUMIN 1.9* 2.0*   No results for input(s): LIPASE, AMYLASE in the last 168 hours. No results for input(s): AMMONIA in the last 168 hours. Coagulation Profile: No results for input(s): INR, PROTIME in the last 168 hours. Cardiac Enzymes: No results for input(s): CKTOTAL, CKMB, CKMBINDEX, TROPONINI in the last 168 hours. BNP (last 3 results) No results for input(s): PROBNP in the last 8760 hours. HbA1C: No results for input(s): HGBA1C in the last 72 hours. CBG: Recent Labs  Lab 11/24/18 1135 11/24/18 1542 11/24/18 1644 11/24/18 2120 11/25/18 0746  GLUCAP 171* 142* 154* 94 83   Lipid Profile: No results for input(s): CHOL, HDL, LDLCALC, TRIG, CHOLHDL, LDLDIRECT in the last 72 hours. Thyroid Function Tests: No results for input(s): TSH,  T4TOTAL, FREET4, T3FREE, THYROIDAB in the last 72 hours. Anemia Panel: No results for input(s): VITAMINB12, FOLATE, FERRITIN, TIBC, IRON, RETICCTPCT in the last 72 hours. Sepsis Labs: No results for input(s): PROCALCITON, LATICACIDVEN in the last 168 hours.  No results found for this or any previous visit (from the past 240 hour(s)).       Radiology Studies: Dg Knee 1-2 Views Left  Result Date: 11/24/2018 CLINICAL DATA:  Diffuse left knee pain EXAM: LEFT KNEE - 1-2 VIEW COMPARISON:  None. FINDINGS: There is no acute displaced fracture or dislocation. There are moderate to severe degenerative changes, greatest within the medial and patellofemoral compartments. A small to moderate size suprapatellar joint effusion is noted. Nonspecific soft tissue swelling is noted about the lower extremity. IMPRESSION: 1. No acute displaced fracture or dislocation. 2. Advanced osteoarthritis of the left knee. 3. Small to moderate size suprapatellar joint effusion. 4. Nonspecific soft tissue swelling, greatest about the lateral aspect of the knee. Electronically Signed   By: Constance Holster M.D.   On: 11/24/2018 16:09   Dg Knee 1-2 Views Right  Result Date: 11/24/2018 CLINICAL DATA:  Initial evaluation for acute right knee pain. EXAM: RIGHT KNEE - 1-2 VIEW COMPARISON:  None. FINDINGS: No acute fracture or dislocation. Moderate joint effusion likely present within the suprapatellar recess, somewhat difficult to evaluate given patient positioning. Moderate to advanced tricompartmental degenerative osteoarthrosis, most notable at the medial femorotibial and patellofemoral articulations. Osseous mineralization normal. No soft tissue abnormality. IMPRESSION: 1. No acute osseous abnormality about the right knee. 2. Moderate joint effusion. 3. Moderate to advanced tricompartmental degenerative osteoarthrosis. Electronically Signed   By: Jeannine Boga M.D.   On: 11/24/2018 21:32        Scheduled Meds:   aspirin EC  81 mg Oral Daily   calcitRIOL  0.25 mcg Oral Daily   Chlorhexidine Gluconate Cloth  6 each Topical Daily   cloNIDine  0.1 mg Oral BID   febuxostat  40 mg Oral Daily   feeding supplement (ENSURE ENLIVE)  237 mL Oral BID BM   furosemide  40 mg Oral BID   insulin aspart  0-15 Units Subcutaneous TID WC   insulin aspart  5 Units Subcutaneous TID WC   insulin glargine  25 Units Subcutaneous Daily   levothyroxine  50 mcg Oral Q0600   mouth rinse  15 mL Mouth Rinse BID   metoprolol succinate  50 mg Oral Daily   nutrition supplement (JUVEN)  1 packet Oral BID BM   pantoprazole  40 mg Oral Q0600   patiromer  8.4 g Oral Daily   pravastatin  80 mg Oral QHS   predniSONE  20 mg Oral Q breakfast   sodium bicarbonate  1,300 mg Oral TID   tamsulosin  0.4 mg Oral QPC breakfast   Continuous Infusions:   LOS: 17 days     Georgette Shell, MD [facility]  If 7PM-7AM, please contact night-coverage [email or web address] Password [facility] 11/25/2018, 11:39 AM

## 2018-11-25 NOTE — Progress Notes (Signed)
Patient refused hs dsg change.

## 2018-11-25 NOTE — Progress Notes (Signed)
Occupational Therapy Treatment Patient Details Name: Emily Hughes MRN: [id number] DOB: [birth year] Today's Date: 11/25/2018    History of present illness 78 year old female admitted for wound infection and hyperglycemia. PMH:  DM, CKD, A Fib, CAD, HTN and seizure disorder   OT comments  This 78 yo female admitted with above presents to acute OT with today focusing on Bil UE exercises and initiating rolling in bed. She will continue to benefit from acute OT with follow up OT at SNF.   Follow Up Recommendations  SNF    Equipment Recommendations  Other (comment)(TBD next venue)       Precautions / Restrictions Precautions Precautions: Fall Precaution Comments: R knee pain Restrictions Weight Bearing Restrictions: No       Mobility Bed Mobility Overal bed mobility: Needs Assistance             General bed mobility comments: Worked on rolling in bed. Pt able to bring LLE into knee flexion then use Bil UEs on right upper bed rail to roll her self 1/2 way onto her side. Practiced this 5 times with holding for a voutn of 5 each time. Rolling left, pt needs increased A for RLE to bend and to keep bent, then pt used Bil UEs on left bed rail withj ability to get self 1/4 way onto her left side--practiced this 5 times with pt holding for a count of 5         ADL either performed or assessed with clinical judgement   ADL       Grooming: Set up;Wash/dry face;Bed level                                       Vision Patient Visual Report: No change from baseline            Cognition Arousal/Alertness: Awake/alert Behavior During Therapy: WFL for tasks assessed/performed Overall Cognitive Status: Within Functional Limits for tasks assessed                         Following Commands: Follows one step commands with increased time                Exercises Other Exercises Other Exercises: Pt performed Bil UE exercises with level 1 theraband  5 reps each of tricep extension (with theraband tied to head board), horizontal ab/adduction with band tied to lower part of upper bed rail. Each rep was held for a count of 5.           Pertinent Vitals/ Pain       Pain Assessment: Faces Faces Pain Scale: Hurts whole lot Pain Location: R LE with movement (she reports all joints) Pain Descriptors / Indicators: Grimacing;Moaning;Discomfort;Tender Pain Intervention(s): Monitored during session;Repositioned         Frequency  Min 2X/week        Progress Toward Goals  OT Goals(current goals can now be found in the care plan section)  Progress towards OT goals: Progressing toward goals(with theraband exercises)     Plan Discharge plan remains appropriate       AM-PAC OT "6 Clicks" Daily Activity     Outcome Measure   Help from another person eating meals?: A Little Help from another person taking care of personal grooming?: A Little Help from another person toileting, which includes using toliet, bedpan, or urinal?: Total Help  from another person bathing (including washing, rinsing, drying)?: A Lot Help from another person to put on and taking off regular upper body clothing?: A Lot Help from another person to put on and taking off regular lower body clothing?: Total 6 Click Score: 12    End of Session    OT Visit Diagnosis: Muscle weakness (generalized) (M62.81);Pain;Other abnormalities of gait and mobility (R26.89) Pain - Right/Left: Right Pain - part of body: Ankle and joints of foot;Knee;Hip   Activity Tolerance Patient limited by pain   Patient Left in bed;with call bell/phone within reach;with bed alarm set   Nurse Communication          Time: 6060-0459 OT Time Calculation (min): 22 min  Charges: OT General Charges $OT Visit: 1 Visit OT Treatments $Therapeutic Exercise: 8-22 mins  [place] Circle, OTR/L Acute [employer] Pager [phone] Office [phone]      Almon Register 11/25/2018, 2:23 PM

## 2018-11-25 NOTE — Progress Notes (Signed)
HYDROTHERAPY TREATMENT      11/25/18 1300  Subjective Assessment  Subjective "when are y'all leaving?" (jokingly)  Patient and Family Stated Goals less pain  Date of Onset  (abscess present on admission)  Prior Treatments s/p I&D 5/7  Evaluation and Treatment  Evaluation and Treatment Procedures Explained to Patient/Family Yes  Evaluation and Treatment Procedures agreed to  Wound / Incision (Open or Dehisced) 11/13/18 Incision - Open Buttocks Left L buttocks wound s/p I&D by Surgery 11/09/18. **HYDRO/PT**  Date First Assessed/Time First Assessed: 11/13/18 1500   Wound Type: Incision - Open  Location: Buttocks  Location Orientation: Left  Wound Description (Comments): L buttocks wound s/p I&D by Surgery 11/09/18. **HYDRO/PT**  Dressing Type ABD;Barrier Film;Gauze ;Moist to dry;Impregnated gauze (petrolatum) (Kerlix packing)  Dressing Changed Changed  Dressing Status Old drainage  Dressing Change Frequency Twice a day  Site / Wound Assessment Bleeding;Pink;Red;Yellow;Brown;Black  % Wound base Red or Granulating 65%  % Wound base Yellow/Fibrinous Exudate 15%  % Wound base Black/Eschar 20%  Peri-wound Assessment Excoriated  Margins Unattached edges (unapproximated)  Drainage Amount Moderate  Drainage Description Serosanguineous  Treatment Debridement (Selective);Hydrotherapy (Pulse lavage);Packing (Saline gauze)  Hydrotherapy  Pulsed lavage therapy - wound location L medial buttocks  Pulsed Lavage with Suction (psi) 8 psi  Pulsed Lavage with Suction - Normal Saline Used 1000 mL  Pulsed Lavage Tip Tip with splash shield  Selective Debridement  Selective Debridement - Location L buttock  Selective Debridement - Tools Used Forceps;Scissors  Selective Debridement - Tissue Removed brown/black tissue and yellow slough  Wound Therapy - Assess/Plan/Recommendations  Wound Therapy - Clinical Statement 78 yo female admitted with L gluteal/buttock abscess. S/P I&D 11/09/18.   Wound Therapy -  Functional Problem List limited mobility  Factors Delaying/Impairing Wound Healing Diabetes Mellitus;Multiple medical problems;Immobility;Incontinence  Hydrotherapy Plan Debridement;Dressing change;Patient/family education;Pulsatile lavage with suction  Wound Therapy - Frequency 6X / week  Wound Therapy - Current Recommendations Case manager/social work  Wound Therapy - Follow Up Recommendations Skilled nursing facility  Wound Plan Continue hydrotherapy, selective debridement, and dressing changes. May need to consider using Santyl to help remove necrotic tissue.   Wound Therapy Goals - Improve the function of patient's integumentary system by progressing the wound(s) through the phases of wound healing by:  Decrease Necrotic Tissue to 20%  Decrease Necrotic Tissue - Progress Progressing toward goal  Increase Granulation Tissue to 80%  Increase Granulation Tissue - Progress Progressing toward goal  Improve Drainage Characteristics Min  Improve Drainage Characteristics - Progress Progressing toward goal  Goals/treatment plan/discharge plan were made with and agreed upon by patient/family Yes  Time For Goal Achievement 2 weeks  Wound Therapy - Potential for Goals Good      Emily Hughes, PT Acute [facility] Services Pager: [phone] Office: [phone]

## 2018-11-26 LAB — GLUCOSE, CAPILLARY
Glucose-Capillary: 156 mg/dL — ABNORMAL HIGH (ref 70–99)
Glucose-Capillary: 141 mg/dL — ABNORMAL HIGH (ref 70–99)
Glucose-Capillary: 187 mg/dL — ABNORMAL HIGH (ref 70–99)
Glucose-Capillary: 301 mg/dL — ABNORMAL HIGH (ref 70–99)

## 2018-11-26 MED ORDER — INSULIN ASPART 100 UNIT/ML ~~LOC~~ SOLN
5.0000 [IU] | Freq: Once | SUBCUTANEOUS | Status: AC
  Administered 2018-11-26: 5 [IU] via SUBCUTANEOUS

## 2018-11-26 NOTE — Progress Notes (Signed)
PROGRESS NOTE    Emily Hughes  KKX:[id number] DOB: 08/10/1940 DOA: 11/08/2018 PCP: Susy Frizzle, MD  Brief Narrative:78 year old African-American female with a past medical history significant for but not limited to diabetes mellitus type 2, CKD stage IV, hypothyroidism, CAD, GERD, hypertension, hyperlipidemia, history of seizure disorder, ? Atrial fibrillation, long with comorbidities who presented to the emergency room with a 3-day history of pain and swelling in her buttocks along with a fever of 102. Family reportedly gave her some Tylenol that helped her improve her temperature down to 99 but she is found to have elevated blood pressures for last few days when in the 400s. She has not been taking her insulin for last 2 days and has not been checking her glucose regularly. On admission she stated that the left buttock pain lasted for last few days and was severe 10 out of 10 and reportedly was a boil that then progressed.  She was worked up and admitted for sepsis secondary to abscess of the left buttock as well as DKA. She was placed in the stepdown unit and was placed on glucose stabilizer and has improvement in her blood sugars. She was incidentally noted to be in atrial fibrillation/A Flutter which then converted to NSR a few days ago. Cardiology was consulted for preoperative clearance and patient was taken for incision and drainage of her abscess on her left buttock and she is POD2. DKA improved and she was transitioned to Long Acting Insulin even though Her CO2 is not >20 given that she has chronic Metabolic Acidosis 2/2 to her Renal Disease and is on Sodium Bicarbonate. She tolerated her Diet without issues but complained of some Abdominal Pain.   Liver function tests were elevated and right upper quadrant ultrasound was done and showed possible acute cholecystitis. I discussed with general surgery Dr. Jens Som who recommended obtaining a HIDA scan however HIDA scan with EF  is not able to be done at this time and if EF is needed to be done will come will be on Tuesday per nursing. So will obtain a HIDA scan without the EF currently. And this HIDA scan was normal hepatobiliary scan so Dr. Windle Guard was less inclined for acute cholecystitis and felt that her LFTs being elevated could be reactionary. We will continue to monitor closely and antibiotics have been de-escalated to just IV cefepime Flagyl for now and will continue to de-escalate accordingly. Echocardiogram was done yesterday   Assessment & Plan:   Principal Problem:   Sepsis ([place]) Active Problems:   Essential hypertension   CAD (coronary artery disease)   GERD (gastroesophageal reflux disease)   DKA (diabetic ketoacidoses) (HCC)   Chronic kidney disease (CKD), stage IV (severe) (HCC)   Type 2 diabetes mellitus with ketoacidosis without coma (HCC)   Abscess of left buttock   Atrial fibrillation with RVR (HCC)   Elevated troponin   Abnormal LFTs   Acute renal failure superimposed on stage 4 chronic kidney disease (HCC)   Acute metabolic encephalopathy   Cellulitis and abscess of buttock   Acute urinary retention   Acute idiopathic gout of multiple sites   Severe protein-calorie malnutrition (HCC)  1sepsis secondary to left buttocks/gluteal abscess status post incision and drainageof abscess POD #14 patient continues to get hydrotherapy daily with surgery following for possible debridement if needed.  Blood cultures have been negative.  COVID-19 negative MRSA PCR negative urine culture negative.  Patient received IV antibiotics for 10 days.  2. Diabetes mellitus type 2/DKA Patient  had presented in DKA felt secondary to problem #1. Hemoglobin A1c 8.4.  Sugars now stable on Lantus 25 units daily with NovoLog 5 units 3 times a day with meals.   3New onset/Transient atrial fibrillation/atrial flutter with variable block status post conversion to normal sinus rhythm with transient third-degree AV  block.?This atrial fibrillation is not permanent paroxysmal or chronic it was just a transient A. fib. Felt likely in the setting of sepsis secondary to problem #1. Patient converted to normal sinus rhythm with a short episode of third-degree AV block overnight while asleep thought possibly secondary to AV nodal blocking drugs and sleep apnea. Patient initially on a Cardizem drip and subsequently transitioned to oral Cardizem which has subsequently been discontinued. Patient seen in consultation by cardiology who feels no need for anticoagulation at this time given short duration and need for surgical intervention. Continue current regimen of Toprol-XL 50 mg daily. Appreciate cardiology input and recommendations.   4 Hypertensionblood pressure better.Continue Toprol and clonidine Norvasc has been stopped yesterday.  5Elevated troponin Felt secondary to demand ischemia in the setting of sepsis, elevated lactic acid and acute on chronic kidney failure. EKG with question of flutter versus sinus tach with PACs. Repeat EKG showed a normal sinus rhythm and evidence of LVH with no signs of ischemia noted. Patient seen by cardiology who felt no further ischemic work-up needed at this time. Continue aspirin 81 mg daily, Toprol-XL, statin. Follow  7Acute on chronic kidney disease stage IV/chronic metabolic acidosis Felt secondary to prerenal azotemia in the setting of sepsis. Patient on admission noted to have a creatinine of 4.76. Patient placed on IV fluids which have subsequently been discontinued. Renal function improved and currently close to baseline. Creatinine at 2.76Continue calcitriol. Acidosis improved with increased bicarb tablets of 1300 mg p.o. 3 times daily. Patient with some lower extremity edema. Placed on Lasix 40 mg orally twice daily. Patient was on Lasix 80 mg in the morning and 40 mg at bedtime as her home medication dose. Outpatient follow-up with nephrology.    8. Hypothyroidism TSH of 1.815 on 11/09/2018. Synthroid.   9. Coronary artery disease Currently stable. Patient denies any acute chest pain. Patient was seen by cardiology during the hospitalization secondary to new onset atrial flutter with variable block. Continue current cardiac medications of aspirin, Toprol-XL, statin, Norvasc. Outpatient follow-up with cardiology.  10. Hyponatremiasodium 134 getting better 11. Hypermagnesemiafollow labs tomorrow.  Magnesium 2.2.  12. Acute encephalopathy/somnolence and lethargy-is more awake and alert today asked me to talk to her sister and update her sister..  13. Morbid obesity Weight loss and dietary counseling given.  14. Right knee gout patient with right knee swelling decreased range of motion painful ambulation with history of gout was on allopurinol 300 mg daily which has been stopped and was started on febuxostat 11/25/2018.  Appreciate Dr. Jess Barters input.  She reports her pain is way better than yesterday.  Continue Uloric.  CRP is 8.2 sed rate 116 uric acid 3.7.   15. Abnormal LFTs AST ALT stable at 63 and 60 total bilirubin less than 0.1.  16. Normocytic anemia/anemia of chronic kidney disease Likely dilutional and postoperative drop.  Hemoglobin stable at 8.7 she received 1 unit of blood transfusion.   17. Leukocytosisresolved  18. gastroesophageal reflux disease Continue PPI.  19. Loose stoolsresolved after stopping stool softeners.   20. Hyperkalemia5.3 continue Veltassa  21. urinary retention Foley catheter placed back in due to urinary retention. Continue Flomax. DC Foley catheter   DVT  prophylaxis:SCDs Code Status:Full Family Communication:dw sister [phone] her sister has picked up [facility] for discharge. Disposition Plan:SNF on dischargewhen okay with general surgery..  Consultants:  Cardiology: Dr. Meda Coffee 11/09/2018  General surgery: Dr. Marlou Starks III 11/08/2018   Procedures:  CT head 11/12/2018  CT pelvis 11/08/2018  Chest x-ray 11/08/2018, 11/12/2018, 11/13/2018, 11/15/2018  Plain films of the left foot 11/12/2018  Limited echocardiogram 11/11/2018  Right upper quadrant ultrasound 11/11/2018  Incision and drainage of left buttocks abscess 11/09/2018 per Dr. Ninfa Linden  EEG 11/15/2018  Antimicrobials:   Oral Flagyl 11/13/2018>>>> 11/14/2018  IV Unasyn 11/14/2018>>>> 11/19/2018  IV cefepime 11/08/2018>>>> 11/14/2018  IV Flagyl 11/08/2018>>>>> 11/13/2018 IV vancomycin 11/08/2018>>>> 11/11/2018   Nutrition Problem: Increased nutrient needs Etiology: acute illness, wound healing     Signs/Symptoms: estimated needs    Interventions: Juven, Ensure Enlive (each supplement provides 350kcal and 20 grams of protein)  Estimated body mass index is 50.84 kg/m as calculated from the following:   Height as of this encounter: 5\' 3"  (1.6 m).   Weight as of this encounter: 130.2 kg.   Subjective:  Patient resting in bed reports her pain is better with Uloric able to move her knee better Objective: Vitals:   11/24/18 2118 11/25/18 0553 11/25/18 2128 11/26/18 0456  BP: 125/60 (!) 117/55 124/63 130/73  Pulse: 85 81 68 80  Resp: 16 20 16 16   Temp: 100.3 F (37.9 C) 99.1 F (37.3 C) 98.1 F (36.7 C) (!) 97.5 F (36.4 C)  TempSrc: Oral Oral Oral Oral  SpO2: 97% 97% 97% 100%  Weight:    130.2 kg  Height:        Intake/Output Summary (Last 24 hours) at 11/26/2018 1149 Last data filed at 11/25/2018 1300 Gross per 24 hour  Intake 120 ml  Output -  Net 120 ml   Filed Weights   11/23/18 0552 11/24/18 0500 11/26/18 0456  Weight: 123.9 kg 119.1 kg 130.2 kg    Examination:  General exam: Appears calm and comfortable  Respiratory system: Clear to auscultation. Respiratory effort normal. Cardiovascular system: S1 & S2 heard, RRR. No JVD, murmurs, rubs, gallops or clicks. No pedal edema. Gastrointestinal system: Abdomen is nondistended, soft and nontender.  No organomegaly or masses felt. Normal bowel sounds heard. Central nervous system: Alert and oriented. No focal neurological deficits. Extremities: 1+ pitting edema bilaterally right more than left Skin: No rashes, lesions or ulcers Psychiatry: Judgement and insight appear normal. Mood & affect appropriate.     Data Reviewed: I have personally reviewed following labs and imaging studies  CBC: Recent Labs  Lab 11/20/18 0459 11/21/18 0817 11/22/18 0615 11/23/18 0702 11/25/18 0558  WBC 13.5* 13.1* 9.6 10.3 10.9*  NEUTROABS 9.8* 9.8*  --   --   --   HGB 7.3* 7.7* 6.9* 7.9* 8.7*  HCT 23.3* 24.7* 21.8* 25.4* 26.8*  MCV 86.0 88.2 86.2 86.7 87.0  PLT 396 402* 417* 413* 270*   Basic Metabolic Panel: Recent Labs  Lab 11/20/18 0459 11/21/18 0817 11/22/18 0615 11/23/18 0702 11/25/18 0558  NA 134* 134* 131* 133* 134*  K 5.1 5.1 5.5* 5.5* 5.3*  CL 107 107 102 104 100  CO2 21* 19* 21* 22 24  GLUCOSE 215* 200* 258* 195* 84  BUN 68* 69* 78* 75* 70*  CREATININE 2.61* 2.86* 2.86* 2.72* 2.75*  CALCIUM 8.4* 8.7* 8.6* 8.7* 9.1  MG  --   --   --  2.3 2.2   GFR: Estimated Creatinine Clearance: 22.9 mL/min (A) (  by C-G formula based on SCr of 2.75 mg/dL (H)). Liver Function Tests: Recent Labs  Lab 11/23/18 0702 11/25/18 0558  AST 67* 63*  ALT 65* 60*  ALKPHOS 71 79  BILITOT 0.8 <0.1*  PROT 5.9* 6.4*  ALBUMIN 1.9* 2.0*   No results for input(s): LIPASE, AMYLASE in the last 168 hours. No results for input(s): AMMONIA in the last 168 hours. Coagulation Profile: No results for input(s): INR, PROTIME in the last 168 hours. Cardiac Enzymes: No results for input(s): CKTOTAL, CKMB, CKMBINDEX, TROPONINI in the last 168 hours. BNP (last 3 results) No results for input(s): PROBNP in the last 8760 hours. HbA1C: No results for input(s): HGBA1C in the last 72 hours. CBG: Recent Labs  Lab 11/25/18 0746 11/25/18 1235 11/25/18 1657 11/25/18 2127 11/26/18 0750  GLUCAP 83 208* 190* 191*  156*   Lipid Profile: No results for input(s): CHOL, HDL, LDLCALC, TRIG, CHOLHDL, LDLDIRECT in the last 72 hours. Thyroid Function Tests: No results for input(s): TSH, T4TOTAL, FREET4, T3FREE, THYROIDAB in the last 72 hours. Anemia Panel: No results for input(s): VITAMINB12, FOLATE, FERRITIN, TIBC, IRON, RETICCTPCT in the last 72 hours. Sepsis Labs: No results for input(s): PROCALCITON, LATICACIDVEN in the last 168 hours.  No results found for this or any previous visit (from the past 240 hour(s)).       Radiology Studies: Dg Knee 1-2 Views Left  Result Date: 11/24/2018 CLINICAL DATA:  Diffuse left knee pain EXAM: LEFT KNEE - 1-2 VIEW COMPARISON:  None. FINDINGS: There is no acute displaced fracture or dislocation. There are moderate to severe degenerative changes, greatest within the medial and patellofemoral compartments. A small to moderate size suprapatellar joint effusion is noted. Nonspecific soft tissue swelling is noted about the lower extremity. IMPRESSION: 1. No acute displaced fracture or dislocation. 2. Advanced osteoarthritis of the left knee. 3. Small to moderate size suprapatellar joint effusion. 4. Nonspecific soft tissue swelling, greatest about the lateral aspect of the knee. Electronically Signed   By: Constance Holster M.D.   On: 11/24/2018 16:09   Dg Knee 1-2 Views Right  Result Date: 11/24/2018 CLINICAL DATA:  Initial evaluation for acute right knee pain. EXAM: RIGHT KNEE - 1-2 VIEW COMPARISON:  None. FINDINGS: No acute fracture or dislocation. Moderate joint effusion likely present within the suprapatellar recess, somewhat difficult to evaluate given patient positioning. Moderate to advanced tricompartmental degenerative osteoarthrosis, most notable at the medial femorotibial and patellofemoral articulations. Osseous mineralization normal. No soft tissue abnormality. IMPRESSION: 1. No acute osseous abnormality about the right knee. 2. Moderate joint effusion. 3.  Moderate to advanced tricompartmental degenerative osteoarthrosis. Electronically Signed   By: Jeannine Boga M.D.   On: 11/24/2018 21:32        Scheduled Meds: . aspirin EC  81 mg Oral Daily  . calcitRIOL  0.25 mcg Oral Daily  . Chlorhexidine Gluconate Cloth  6 each Topical Daily  . cloNIDine  0.1 mg Oral BID  . febuxostat  40 mg Oral Daily  . feeding supplement (ENSURE ENLIVE)  237 mL Oral BID BM  . furosemide  40 mg Oral BID  . insulin aspart  0-15 Units Subcutaneous TID WC  . insulin aspart  5 Units Subcutaneous TID WC  . insulin glargine  25 Units Subcutaneous Daily  . levothyroxine  50 mcg Oral Q0600  . mouth rinse  15 mL Mouth Rinse BID  . metoprolol succinate  50 mg Oral Daily  . nutrition supplement (JUVEN)  1 packet Oral BID BM  .  pantoprazole  40 mg Oral Q0600  . patiromer  8.4 g Oral Daily  . pravastatin  80 mg Oral QHS  . predniSONE  20 mg Oral Q breakfast  . sodium bicarbonate  1,300 mg Oral TID  . tamsulosin  0.4 mg Oral QPC breakfast   Continuous Infusions:   LOS: 18 days     Georgette Shell, MD [facility] If 7PM-7AM, please contact night-coverage [email or web address] Password [facility] 11/26/2018, 11:49 AM

## 2018-11-27 LAB — COMPREHENSIVE METABOLIC PANEL
ALT: 82 U/L — ABNORMAL HIGH (ref 0–44)
AST: 96 U/L — ABNORMAL HIGH (ref 15–41)
Albumin: 1.8 g/dL — ABNORMAL LOW (ref 3.5–5.0)
Alkaline Phosphatase: 71 U/L (ref 38–126)
Anion gap: 10 (ref 5–15)
BUN: 79 mg/dL — ABNORMAL HIGH (ref 8–23)
CO2: 24 mmol/L (ref 22–32)
Calcium: 9 mg/dL (ref 8.9–10.3)
Chloride: 99 mmol/L (ref 98–111)
Creatinine, Ser: 2.73 mg/dL — ABNORMAL HIGH (ref 0.44–1.00)
GFR calc Af Amer: 19 mL/min — ABNORMAL LOW (ref >60)
GFR calc non Af Amer: 16 mL/min — ABNORMAL LOW (ref >60)
Glucose, Bld: 319 mg/dL — ABNORMAL HIGH (ref 70–99)
Potassium: 5.3 mmol/L — ABNORMAL HIGH (ref 3.5–5.1)
Sodium: 133 mmol/L — ABNORMAL LOW (ref 135–145)
Total Bilirubin: <0.1 mg/dL — ABNORMAL LOW (ref 0.3–1.2)
Total Protein: 5.8 g/dL — ABNORMAL LOW (ref 6.5–8.1)

## 2018-11-27 LAB — CBC
HCT: 25.4 % — ABNORMAL LOW (ref 36.0–46.0)
Hemoglobin: 8.2 g/dL — ABNORMAL LOW (ref 12.0–15.0)
MCH: 28.2 pg (ref 26.0–34.0)
MCHC: 32.3 g/dL (ref 30.0–36.0)
MCV: 87.3 fL (ref 80.0–100.0)
Platelets: 359 10*3/uL (ref 150–400)
RBC: 2.91 MIL/uL — ABNORMAL LOW (ref 3.87–5.11)
RDW: 16.2 % — ABNORMAL HIGH (ref 11.5–15.5)
WBC: 9.3 10*3/uL (ref 4.0–10.5)
nRBC: 0 % (ref 0.0–0.2)

## 2018-11-27 LAB — GLUCOSE, CAPILLARY
Glucose-Capillary: 314 mg/dL — ABNORMAL HIGH (ref 70–99)
Glucose-Capillary: 253 mg/dL — ABNORMAL HIGH (ref 70–99)
Glucose-Capillary: 292 mg/dL — ABNORMAL HIGH (ref 70–99)
Glucose-Capillary: 308 mg/dL — ABNORMAL HIGH (ref 70–99)

## 2018-11-27 MED ORDER — POLYETHYLENE GLYCOL 3350 17 G PO PACK: 17.0000 g | PACK | Freq: Every day | ORAL | Status: DC

## 2018-11-27 MED ORDER — POLYETHYLENE GLYCOL 3350 17 G PO PACK
17.0000 g | PACK | Freq: Every day | ORAL | Status: DC
  Administered 2018-11-28 – 2018-11-29 (×2): 17 g via ORAL
  Filled 2018-11-27 (×2): qty 1

## 2018-11-27 NOTE — Progress Notes (Signed)
Patient's last BM 11/23/18 with on call notified via text paging. New order for Miralax to start tonight at 2100. Patient updated.

## 2018-11-27 NOTE — Progress Notes (Signed)
Physical Therapy Treatment Patient Details Name: Emily Hughes MRN: [id number] DOB: 1940-08-12 Today's Date: 11/27/2018    History of Present Illness 78 year old female admitted for wound infection and hyperglycemia. PMH:  DM, CKD, A Fib, CAD, HTN and seizure disorder    PT Comments    Progressing slowly with mobility. Bed mobility is improving. Pt is still having difficulty with standing 2* weakness and gout pain in R LE. Will continue to follow and progress activity as tolerated.    Follow Up Recommendations  SNF     Equipment Recommendations  None recommended by PT    Recommendations for Other Services       Precautions / Restrictions Precautions Precautions: Fall Restrictions Weight Bearing Restrictions: No    Mobility  Bed Mobility   Bed Mobility: Supine to Sit;Sit to Supine;Rolling Rolling: Mod assist   Supine to sit: Mod assist;HOB elevated Sit to supine: Mod assist;+2 for safety/equipment;+2 for physical assistance;HOB elevated   General bed mobility comments: Assist for trunk and bil LEs. Assist to scoot to EOB using bedpad. Increased time. Pt relied heavily on bedrail.   Transfers Overall transfer level: Needs assistance Equipment used: Rolling walker (2 wheeled) Transfers: Sit to/from Stand Max assist; +2 for safety/equipment;+2 for physical assistance; Elevated surface         General transfer comment: Attempted sit to stand x 2 times however pt unable to to clear hips from bed. Limited use of R LE 2* knee pain (gout flare).     Ambulation/Gait                 Stairs             Wheelchair Mobility    Modified Rankin (Stroke Patients Only)       Balance Overall balance assessment: Needs assistance Sitting-balance support: Feet supported Sitting balance-Leahy Scale: Fair                                      Cognition Arousal/Alertness: Awake/alert Behavior During Therapy: WFL for tasks  assessed/performed Overall Cognitive Status: Within Functional Limits for tasks assessed                                        Exercises      General Comments        Pertinent Vitals/Pain Pain Assessment: Faces Faces Pain Scale: Hurts even more Pain Location: R LE with movement (gout pain) Pain Descriptors / Indicators: Grimacing;Moaning;Discomfort;Tender Pain Intervention(s): Limited activity within patient's tolerance;Repositioned    Home Living                      Prior Function            PT Goals (current goals can now be found in the care plan section) Progress towards PT goals: Progressing toward goals    Frequency    Min 2X/week      PT Plan Current plan remains appropriate    Co-evaluation              AM-PAC PT "6 Clicks" Mobility   Outcome Measure  Help needed turning from your back to your side while in a flat bed without using bedrails?: A Lot Help needed moving from lying on your back to sitting on the side of  a flat bed without using bedrails?: A Lot Help needed moving to and from a bed to a chair (including a wheelchair)?: Total Help needed standing up from a chair using your arms (e.g., wheelchair or bedside chair)?: Total Help needed to walk in hospital room?: Total Help needed climbing 3-5 steps with a railing? : Total 6 Click Score: 8    End of Session Equipment Utilized During Treatment: Gait belt Activity Tolerance: Patient limited by pain;Patient limited by fatigue Patient left: in bed;with call bell/phone within reach   PT Visit Diagnosis: Muscle weakness (generalized) (M62.81);Other abnormalities of gait and mobility (R26.89);Pain;Difficulty in walking, not elsewhere classified (R26.2) Pain - Right/Left: Right Pain - part of body: Knee     Time: 0071-2197 PT Time Calculation (min) (ACUTE ONLY): 15 min  Charges:  $Therapeutic Activity: 8-22 mins                       Weston Anna, PT Acute  Rehabilitation Services Pager: [phone] Office: [phone]

## 2018-11-27 NOTE — Progress Notes (Signed)
HYDROTHERAPY TREATMENT      11/27/18 1400  Subjective Assessment  Subjective "okay"  Patient and Family Stated Goals less pain  Date of Onset  (abscess on admission)  Prior Treatments s/p I&D 5/7  Evaluation and Treatment  Evaluation and Treatment Procedures Explained to Patient/Family Yes  Evaluation and Treatment Procedures agreed to  Wound / Incision (Open or Dehisced) 11/13/18 Incision - Open Buttocks Left L buttocks wound s/p I&D by Surgery 11/09/18. **HYDRO/PT**  Date First Assessed/Time First Assessed: 11/13/18 1500   Wound Type: Incision - Open  Location: Buttocks  Location Orientation: Left  Wound Description (Comments): L buttocks wound s/p I&D by Surgery 11/09/18. **HYDRO/PT**  Dressing Type ABD;Barrier Film (skin prep);Gauze (Comment);Moist to dry;Impregnated gauze (petrolatum) (Kerlix packing)  Dressing Changed Changed  Dressing Status Old drainage  Dressing Change Frequency Twice a day  Site / Wound Assessment Bleeding;Pink;Yellow;Black;Red  % Wound base Red or Granulating 70%  % Wound base Yellow/Fibrinous Exudate 15%  % Wound base Black/Eschar 15%  Peri-wound Assessment Excoriated  Margins Unattached edges (unapproximated)  Drainage Amount Moderate  Drainage Description Serosanguineous  Treatment Debridement (Selective);Hydrotherapy (Pulse lavage);Packing (Saline gauze)  Hydrotherapy  Pulsed lavage therapy - wound location L medial buttocks  Pulsed Lavage with Suction (psi) 8 psi  Pulsed Lavage with Suction - Normal Saline Used 1000 mL  Pulsed Lavage Tip Tip with splash shield  Selective Debridement  Selective Debridement - Location L buttock  Selective Debridement - Tools Used Forceps;Scissors  Selective Debridement - Tissue Removed brown/black tissue and yellow slough  Wound Therapy - Assess/Plan/Recommendations  Wound Therapy - Clinical Statement 78 yo female admitted with L gluteal/buttock abscess. S/P I&D 11/09/18.   Wound Therapy - Functional Problem List  limited mobility  Factors Delaying/Impairing Wound Healing Diabetes Mellitus;Multiple medical problems;Immobility;Incontinence  Hydrotherapy Plan Debridement;Dressing change;Patient/family education;Pulsatile lavage with suction  Wound Therapy - Frequency 6X / week  Wound Therapy - Current Recommendations Case manager/social work  Wound Therapy - Follow Up Recommendations Skilled nursing facility  Wound Plan Continue hydrotherapy, selective debridement, and dressing changes. May need to consider using Santyl to help remove necrotic tissue. Pt is no longer using the Purewick so she will likely need more dressing changes due to soiling.   Wound Therapy Goals - Improve the function of patient's integumentary system by progressing the wound(s) through the phases of wound healing by:  Decrease Necrotic Tissue to 20%  Decrease Necrotic Tissue - Progress Progressing toward goal  Increase Granulation Tissue to 80%  Increase Granulation Tissue - Progress Progressing toward goal  Improve Drainage Characteristics Min  Improve Drainage Characteristics - Progress Progressing toward goal  Goals/treatment plan/discharge plan were made with and agreed upon by patient/family Yes  Time For Goal Achievement 2 weeks  Wound Therapy - Potential for Goals Good    Emily Hughes, PT Acute Rehabilitation Services Pager: [phone] Office: [phone]

## 2018-11-27 NOTE — Plan of Care (Signed)
Continued teaching needed regarding nutritional supplements and better eating habits. Patient receptive to learning.

## 2018-11-27 NOTE — Progress Notes (Signed)
PROGRESS NOTE    Emily Hughes  MGN:[id number] DOB: September 30, 1940 DOA: 11/08/2018 PCP: Susy Frizzle, MD Brief Narrative:78 year old African-American female with a past medical history significant for but not limited to diabetes mellitus type 2, CKD stage IV, hypothyroidism, CAD, GERD, hypertension, hyperlipidemia, history of seizure disorder, ? Atrial fibrillation, long with comorbidities who presented to the emergency room with a 3-day history of pain and swelling in her buttocks along with a fever of 102. Family reportedly gave her some Tylenol that helped her improve her temperature down to 99 but she is found to have elevated blood pressures for last few days when in the 400s. She has not been taking her insulin for last 2 days and has not been checking her glucose regularly. On admission she stated that the left buttock pain lasted for last few days and was severe 10 out of 10 and reportedly was a boil that then progressed.  She was worked up and admitted for sepsis secondary to abscess of the left buttock as well as DKA. She was placed in the stepdown unit and was placed on glucose stabilizer and has improvement in her blood sugars. She was incidentally noted to be in atrial fibrillation/A Flutter which then converted to NSR a few days ago. Cardiology was consulted for preoperative clearance and patient was taken for incision and drainage of her abscess on her left buttock and she is POD2. DKA improved and she was transitioned to Long Acting Insulin even though Her CO2 is not >20 given that she has chronic Metabolic Acidosis 2/2 to her Renal Disease and is on Sodium Bicarbonate. She tolerated her Diet without issues but complained of some Abdominal Pain.   Liver function tests were elevated and right upper quadrant ultrasound was done and showed possible acute cholecystitis. I discussed with general surgery Dr. Jens Som who recommended obtaining a HIDA scan however HIDA scan with EF  is not able to be done at this time and if EF is needed to be done will come will be on Tuesday per nursing. So will obtain a HIDA scan without the EF currently. And this HIDA scan was normal hepatobiliary scan so Dr. Windle Guard was less inclined for acute cholecystitis and felt that her LFTs being elevated could be reactionary. We will continue to monitor closely and antibiotics have been de-escalated to just IV cefepime Flagyl for now and will continue to de-escalate accordingly.  Assessment & Plan:   Principal Problem:   Sepsis ([place]) Active Problems:   Essential hypertension   CAD (coronary artery disease)   GERD (gastroesophageal reflux disease)   DKA (diabetic ketoacidoses) (HCC)   Chronic kidney disease (CKD), stage IV (severe) (HCC)   Type 2 diabetes mellitus with ketoacidosis without coma (HCC)   Abscess of left buttock   Atrial fibrillation with RVR (HCC)   Elevated troponin   Abnormal LFTs   Acute renal failure superimposed on stage 4 chronic kidney disease (HCC)   Acute metabolic encephalopathy   Cellulitis and abscess of buttock   Acute urinary retention   Acute idiopathic gout of multiple sites   Severe protein-calorie malnutrition (HCC)    1sepsis secondary to left buttocks/gluteal abscess status post incision and drainageof abscess POD #14patient continues to get hydrotherapy daily with surgery following for possible debridement if needed. Blood cultures have been negative. COVID-19 negative MRSA PCR negative urine culture negative. Patient received IV antibiotics for 10 days.  Patient family would like her to go to [facility]  2. Diabetes mellitus  type 2/DKA Patient had presented in DKA felt secondary to problem #1. Hemoglobin A1c 8.4.Sugars now stable on Lantus 25 units daily with NovoLog 5 units 3 times a day with meals.  3New onset/Transient atrial fibrillation/atrial flutter with variable block status post conversion to normal sinus rhythm with  transient third-degree AV block.?This atrial fibrillation is not permanent paroxysmal or chronic it was just a transient A. fib. Felt likely in the setting of sepsis secondary to problem #1. Patient converted to normal sinus rhythm with a short episode of third-degree AV block overnight while asleep thought possibly secondary to AV nodal blocking drugs and sleep apnea. Patient initially on a Cardizem drip and subsequently transitioned to oral Cardizem which has subsequently been discontinued. Patient seen in consultation by cardiology who feels no need for anticoagulation at this time given short duration and need for surgical intervention. Continue current regimen of Toprol-XL 50 mg daily. Appreciate cardiology input and recommendations.   4Hypertensionblood pressure better.Continue Toprol and clonidine Norvasc has been stopped yesterday.   5Elevated troponin Felt secondary to demand ischemia in the setting of sepsis, elevated lactic acid and acute on chronic kidney failure. EKG with question of flutter versus sinus tach with PACs. Repeat EKG showed a normal sinus rhythm and evidence of LVH with no signs of ischemia noted. Patient seen by cardiology who felt no further ischemic work-up needed at this time. Continue aspirin 81 mg daily, Toprol-XL, statin. Follow  7Acute on chronic kidney disease stage IV/chronic metabolic acidosis Felt secondary to prerenal azotemia in the setting of sepsis. Patient on admission noted to have a creatinine of 4.76. Patient placed on IV fluids which have subsequently been discontinued. Renal function improved and currently close to baseline. Creatinine at 2.76Continue calcitriol. Acidosis improved with increased bicarb tablets of 1300 mg p.o. 3 times daily. Patient with some lower extremity edema. Placed on Lasix 40 mg orally twice daily. Patient was on Lasix 80 mg in the morning and 40 mg at bedtime as her home medication dose. Outpatient  follow-up with nephrology.   8. Hypothyroidism TSH of 1.815 on 11/09/2018. Synthroid.   9. Coronary artery disease Currently stable. Patient denies any acute chest pain. Patient was seen by cardiology during the hospitalization secondary to new onset atrial flutter with variable block. Continue current cardiac medications of aspirin, Toprol-XL, statin, Norvasc. Outpatient follow-up with cardiology.  10. Hyponatremia stable at 133 11. Hypermagnesemiafollow labs tomorrow.Magnesium 2.2.  12. Acute encephalopathy/somnolence and lethargy-is more awake and alert today asked me to talk to her sister and update her sister..  13. Morbid obesity Weight loss and dietary counseling given.  14. Right knee gout patient with right knee swelling decreased range of motion painful ambulation with history of gout was on allopurinol 300 mg daily which has been stopped and was started on febuxostat 11/25/2018.  Appreciate Dr. Jess Barters input.  She reports her pain is way better than yesterday.  Continue Uloric.    15. Abnormal LFTsAST ALT 96 and 82 follow-up as needed  16. Normocytic anemia/anemia of chronic kidney disease Likely dilutional and postoperative drop.Hemoglobin stable at 8.2 she received 1 unit of blood transfusion.   17. Leukocytosisresolved  18. gastroesophageal reflux disease Continue PPI.  19. Loose stoolsresolved after stopping stool softeners.  20. Hyperkalemia5.3continue Veltassa  21. urinary retention Foley catheter placed back in due to urinary retention. Continue Flomax. DC Foley catheter  DVT prophylaxis:SCDs Code Status:Full Family Communication:dw sister [phone] sister has picked up [employer] for discharge. Disposition Plan:SNF on dischargewhen  okay with general surgery..  Consultants:  Cardiology: Dr. Meda Coffee 11/09/2018  General surgery: Dr. Marlou Starks III 11/08/2018 Procedures:  CT head 11/12/2018  CT pelvis  11/08/2018  Chest x-ray 11/08/2018, 11/12/2018, 11/13/2018, 11/15/2018  Plain films of the left foot 11/12/2018  Limited echocardiogram 11/11/2018  Right upper quadrant ultrasound 11/11/2018  Incision and drainage of left buttocks abscess 11/09/2018 per Dr. Ninfa Linden  EEG 11/15/2018  Antimicrobials:   Oral Flagyl 11/13/2018>>>> 11/14/2018  IV Unasyn 11/14/2018>>>> 11/19/2018  IV cefepime 11/08/2018>>>> 11/14/2018  IV Flagyl 11/08/2018>>>>> 11/13/2018 IV vancomycin 11/08/2018>>>> 11/11/2018   Nutrition Problem: Increased nutrient needs Etiology: acute illness, wound healing     Signs/Symptoms: estimated needs    Interventions: Juven, Ensure Enlive (each supplement provides 350kcal and 20 grams of protein)  Estimated body mass index is 51.02 kg/m as calculated from the following:   Height as of this encounter: 5\' 3"  (1.6 m).   Weight as of this encounter: 130.6 kg.   Subjective: She is resting in bed she reports her right knee pain is better though she still has the pain able to move her left lower extremity better than the right lower extremity  Objective: Vitals:   11/26/18 0456 11/26/18 1340 11/26/18 2013 11/27/18 0553  BP: 130/73 (!) 111/54 133/69 136/69  Pulse: 80 66 80 76  Resp: 16 20 19 20   Temp: (!) 97.5 F (36.4 C) 98.2 F (36.8 C) 98.6 F (37 C) 98.6 F (37 C)  TempSrc: Oral  Oral Oral  SpO2: 100% 95% 95% 95%  Weight: 130.2 kg   130.6 kg  Height:        Intake/Output Summary (Last 24 hours) at 11/27/2018 0831 Last data filed at 11/27/2018 0813 Gross per 24 hour  Intake 240 ml  Output 400 ml  Net -160 ml   Filed Weights   11/24/18 0500 11/26/18 0456 11/27/18 0553  Weight: 119.1 kg 130.2 kg 130.6 kg    Examination:  General exam: Appears calm and comfortable  Respiratory system: Clear to auscultation. Respiratory effort normal. Cardiovascular system: S1 & S2 heard, RRR. No JVD, murmurs, rubs, gallops or clicks. No pedal edema. Gastrointestinal system: Abdomen  is nondistended, soft and nontender. No organomegaly or masses felt. Normal bowel sounds heard. Central nervous system: Alert and oriented. No focal neurological deficits. Extremities trace edema bilaterally Skin: No rashes, lesions or ulcers Psychiatry: Judgement and insight appear normal. Mood & affect appropriate.     Data Reviewed: I have personally reviewed following labs and imaging studies  CBC: Recent Labs  Lab 11/21/18 0817 11/22/18 0615 11/23/18 0702 11/25/18 0558 11/27/18 0541  WBC 13.1* 9.6 10.3 10.9* 9.3  NEUTROABS 9.8*  --   --   --   --   HGB 7.7* 6.9* 7.9* 8.7* 8.2*  HCT 24.7* 21.8* 25.4* 26.8* 25.4*  MCV 88.2 86.2 86.7 87.0 87.3  PLT 402* 417* 413* 402* 076   Basic Metabolic Panel: Recent Labs  Lab 11/21/18 0817 11/22/18 0615 11/23/18 0702 11/25/18 0558 11/27/18 0541  NA 134* 131* 133* 134* 133*  K 5.1 5.5* 5.5* 5.3* 5.3*  CL 107 102 104 100 99  CO2 19* 21* 22 24 24   GLUCOSE 200* 258* 195* 84 319*  BUN 69* 78* 75* 70* 79*  CREATININE 2.86* 2.86* 2.72* 2.75* 2.73*  CALCIUM 8.7* 8.6* 8.7* 9.1 9.0  MG  --   --  2.3 2.2  --    GFR: Estimated Creatinine Clearance: 23.2 mL/min (A) (by C-G formula based on SCr of 2.73 mg/dL (  H)). Liver Function Tests: Recent Labs  Lab 11/23/18 0702 11/25/18 0558 11/27/18 0541  AST 67* 63* 96*  ALT 65* 60* 82*  ALKPHOS 71 79 71  BILITOT 0.8 <0.1* <0.1*  PROT 5.9* 6.4* 5.8*  ALBUMIN 1.9* 2.0* 1.8*   No results for input(s): LIPASE, AMYLASE in the last 168 hours. No results for input(s): AMMONIA in the last 168 hours. Coagulation Profile: No results for input(s): INR, PROTIME in the last 168 hours. Cardiac Enzymes: No results for input(s): CKTOTAL, CKMB, CKMBINDEX, TROPONINI in the last 168 hours. BNP (last 3 results) No results for input(s): PROBNP in the last 8760 hours. HbA1C: No results for input(s): HGBA1C in the last 72 hours. CBG: Recent Labs  Lab 11/26/18 0750 11/26/18 1158 11/26/18 1609  11/26/18 2015 11/27/18 0734  GLUCAP 156* 141* 187* 301* 314*   Lipid Profile: No results for input(s): CHOL, HDL, LDLCALC, TRIG, CHOLHDL, LDLDIRECT in the last 72 hours. Thyroid Function Tests: No results for input(s): TSH, T4TOTAL, FREET4, T3FREE, THYROIDAB in the last 72 hours. Anemia Panel: No results for input(s): VITAMINB12, FOLATE, FERRITIN, TIBC, IRON, RETICCTPCT in the last 72 hours. Sepsis Labs: No results for input(s): PROCALCITON, LATICACIDVEN in the last 168 hours.  No results found for this or any previous visit (from the past 240 hour(s)).       Radiology Studies: No results found.      Scheduled Meds:  aspirin EC  81 mg Oral Daily   calcitRIOL  0.25 mcg Oral Daily   Chlorhexidine Gluconate Cloth  6 each Topical Daily   cloNIDine  0.1 mg Oral BID   febuxostat  40 mg Oral Daily   feeding supplement (ENSURE ENLIVE)  237 mL Oral BID BM   furosemide  40 mg Oral BID   insulin aspart  0-15 Units Subcutaneous TID WC   insulin aspart  5 Units Subcutaneous TID WC   insulin glargine  25 Units Subcutaneous Daily   levothyroxine  50 mcg Oral Q0600   mouth rinse  15 mL Mouth Rinse BID   metoprolol succinate  50 mg Oral Daily   nutrition supplement (JUVEN)  1 packet Oral BID BM   pantoprazole  40 mg Oral Q0600   patiromer  8.4 g Oral Daily   pravastatin  80 mg Oral QHS   predniSONE  20 mg Oral Q breakfast   sodium bicarbonate  1,300 mg Oral TID   tamsulosin  0.4 mg Oral QPC breakfast   Continuous Infusions:   LOS: 19 days      Georgette Shell, MD [facility] If 7PM-7AM, please contact night-coverage [email or web address] Password [facility] 11/27/2018, 8:31 AM

## 2018-11-27 NOTE — Progress Notes (Signed)
Inpatient Diabetes Program Recommendations  AACE/ADA: New Consensus Statement on Inpatient Glycemic Control (2015)  Target Ranges:  Prepandial:   less than 140 mg/dL      Peak postprandial:   less than 180 mg/dL (1-2 hours)      Critically ill patients:  140 - 180 mg/dL   Lab Results  Component Value Date   GLUCAP 314 (H) 11/27/2018   HGBA1C 8.4 (H) 11/11/2018    Review of Glycemic Control  Blood sugars 319, 314 mg/dL Eating 100% meals. Blood sugars continue above goal of < 180 mg/dL. Needs insulin adjustment.  Inpatient Diabetes Program Recommendations:     Increase Lantus to 27 units QD Add HS correction.  Continue to follow.  Thank you. Lorenda Peck, RD, LDN, CDE Inpatient Diabetes Coordinator [phone]

## 2018-11-28 LAB — GLUCOSE, CAPILLARY
Glucose-Capillary: 230 mg/dL — ABNORMAL HIGH (ref 70–99)
Glucose-Capillary: 213 mg/dL — ABNORMAL HIGH (ref 70–99)
Glucose-Capillary: 241 mg/dL — ABNORMAL HIGH (ref 70–99)
Glucose-Capillary: 216 mg/dL — ABNORMAL HIGH (ref 70–99)

## 2018-11-28 MED ORDER — INSULIN ASPART 100 UNIT/ML ~~LOC~~ SOLN
8.0000 [IU] | Freq: Three times a day (TID) | SUBCUTANEOUS | Status: DC
  Administered 2018-11-28 – 2018-11-29 (×3): 8 [IU] via SUBCUTANEOUS

## 2018-11-28 MED ORDER — INSULIN GLARGINE 100 UNIT/ML ~~LOC~~ SOLN
32.0000 [IU] | Freq: Every day | SUBCUTANEOUS | Status: DC
  Administered 2018-11-29: 10:00:00 32 [IU] via SUBCUTANEOUS
  Filled 2018-11-28: qty 0.32

## 2018-11-28 MED ORDER — SENNA 8.6 MG PO TABS
2.0000 | ORAL_TABLET | Freq: Every day | ORAL | Status: DC
  Administered 2018-11-28: 17.2 mg via ORAL
  Filled 2018-11-28: qty 2

## 2018-11-28 MED ORDER — COLLAGENASE 250 UNIT/GM EX OINT
TOPICAL_OINTMENT | Freq: Every day | CUTANEOUS | Status: DC
  Administered 2018-11-29: 11:00:00 via TOPICAL
  Filled 2018-11-28: qty 90

## 2018-11-28 NOTE — Progress Notes (Signed)
19 Days Post-Op    CC:DKA/left gluteal abscess  Subjective: The site looks good.  She can be discharged from hydrotherapy upon discharge to the skilled nursing facility.  Objective: Vital signs in last 24 hours: Temp:  [98.2 F (36.8 C)] 98.2 F (36.8 C) (05/26 0435) Pulse Rate:  [66-72] 72 (05/26 1025) Resp:  [16-20] 20 (05/26 0435) BP: (114-133)/(58-69) 133/60 (05/26 1025) SpO2:  [97 %-98 %] 97 % (05/26 0435) Weight:  [127.9 kg] 127.9 kg (05/26 0600) Last BM Date: 11/23/18  Intake/Output from previous day: 05/25 0701 - 05/26 0700 In: 1320 [P.O.:1320] Out: 350 [Urine:350] Intake/Output this shift: Total I/O In: 480 [P.O.:480] Out: -   11/10/18 picture  General appearance: alert, cooperative and no distress Skin: See picture below  As you can see site is healing well.  I would continue to apply Santy to the lower right area 3-6 O'clock on this picture. Then wet to dry dressing BID/PRN.  Lab Results:  Recent Labs    11/27/18 0541  WBC 9.3  HGB 8.2*  HCT 25.4*  PLT 359    BMET Recent Labs    11/27/18 0541  NA 133*  K 5.3*  CL 99  CO2 24  GLUCOSE 319*  BUN 79*  CREATININE 2.73*  CALCIUM 9.0   PT/INR No results for input(s): LABPROT, INR in the last 72 hours.  Recent Labs  Lab 11/23/18 0702 11/25/18 0558 11/27/18 0541  AST 67* 63* 96*  ALT 65* 60* 82*  ALKPHOS 71 79 71  BILITOT 0.8 <0.1* <0.1*  PROT 5.9* 6.4* 5.8*  ALBUMIN 1.9* 2.0* 1.8*     Lipase     Component Value Date/Time   LIPASE 21 06/24/2014 0816     Medications: . aspirin EC  81 mg Oral Daily  . calcitRIOL  0.25 mcg Oral Daily  . cloNIDine  0.1 mg Oral BID  . collagenase   Topical Daily  . febuxostat  40 mg Oral Daily  . feeding supplement (ENSURE ENLIVE)  237 mL Oral BID BM  . furosemide  40 mg Oral BID  . insulin aspart  8 Units Subcutaneous TID WC  . [START ON 11/29/2018] insulin glargine  32 Units Subcutaneous Daily  . levothyroxine  50 mcg Oral Q0600  . mouth rinse   15 mL Mouth Rinse BID  . metoprolol succinate  50 mg Oral Daily  . nutrition supplement (JUVEN)  1 packet Oral BID BM  . pantoprazole  40 mg Oral Q0600  . patiromer  8.4 g Oral Daily  . polyethylene glycol  17 g Oral Daily  . pravastatin  80 mg Oral QHS  . senna  2 tablet Oral QHS  . sodium bicarbonate  1,300 mg Oral TID  . tamsulosin  0.4 mg Oral QPC breakfast    Assessment/Plan  DKA Type 2 diabetes Chronic on acute;stage IVkidney disease Hypertension Elevated troponin/Atrial fibrillation Hx of palpitations GERD Hx of seizures 06/2015 Morbid obesity BMI 43.5  Sepsis secondary to below Left buttocks/perianal abscess             -Incision and drainage of left buttocks abscess 11/09/2018, DR. Coralie Keens             -Continue local wound care, decrease freq. Of hydrotherapy to M/W/F             - cx grew pan-sensitive E.coli -wound cleaning up well, ok to stop abx from our standpoint Leukocytosis - WBC still elevated at 22.1, afebrile, no hypoTN or tachycardia  Diarrhea - hold laxatives and insert rectal tube  FEN:HH/CM diet ID: Rocephin/vanc5/6;Maxipime/flagyl5/7- 5/12;Unasyn 5/12>> DVT: SCDs/can be on chemical DVT prophylaxis from our standpoint Follow-up: TBD POC: Carlena Sax [phone]  Plan: She can be discharged from hydrotherapy upon transfer to skilled nursing facility.  She will need at least twice daily dressing changes with Santyl applied to the affected areas in the right lower quadrant from about 3-6 o'clock.  She can shower and get soap and water into the site at the nursing facility.  If she cannot stand for showering she can have sitz bath and then have the area cleaned and redressed.  She can follow-up at a wound care clinic/or the skilled nursing facility medical team.  Please call if we can be of further assistance.  In order to completely heal this she needs to improve her nutrition and mobility.  She cannot lie on this, and  expected to heal.    LOS: 20 days    Emily Hughes 11/28/2018 [phone]

## 2018-11-28 NOTE — Plan of Care (Signed)
Patient's eating is increasing with explanation reinforced regarding nutritional supplements. Patient is also experiencing a decrease in pain and encouraged to increase activity .

## 2018-11-28 NOTE — Progress Notes (Addendum)
Occupational Therapy Treatment Patient Details Name: Emily Hughes MRN: [id number] DOB: 1941-04-21 Today's Date: 11/28/2018    History of present illness 78 year old female admitted for wound infection and hyperglycemia. PMH:  DM, CKD, A Fib, CAD, HTN and seizure disorder   OT comments  Pt progressing towards OT goals, presents supine in bed agreeable to therapy session. Pt performing UB bathing/dressing during session at bed level, completing task with overall setup/supervision assist. Pt engaged in additional bil UE and LE exercises, intermittently using level 1 theraband for UB exercise. Pt taking rest breaks PRN; she remains motivated to work with therapy to regain her strength. Feel SNF recommendation remains appropriate at this time. Will continue to follow acutely.   Follow Up Recommendations  SNF    Equipment Recommendations  Other (comment)(TBA)          Precautions / Restrictions Precautions Precautions: Fall Restrictions Weight Bearing Restrictions: No       Mobility Bed Mobility Overal bed mobility: Needs Assistance                                                                                 ADL either performed or assessed with clinical judgement   ADL Overall ADL's : Needs assistance/impaired     Grooming: Set up;Wash/dry face;Bed level   Upper Body Bathing: Min guard;Set up;Bed level Upper Body Bathing Details (indicate cue type and reason): pt performing UB bathing at bed level, setup/supervision assist provided Lower Body Bathing: Maximal assistance;Bed level Lower Body Bathing Details (indicate cue type and reason): pt declining need to do full wash up of LB, but does initiate washing perineal region during UB bathing; setup/supervision assist provided for this part of task Upper Body Dressing : Minimal assistance;Bed level Upper Body Dressing Details (indicate cue type and reason): doffing/donning new gown                   General ADL Comments: pt performing UB bathing/dressing; performed additional UB/LB exercises                       Cognition Arousal/Alertness: Awake/alert Behavior During Therapy: WFL for tasks assessed/performed Overall Cognitive Status: Within Functional Limits for tasks assessed                                          Exercises Exercises: Other exercises;General Upper Extremity;General Lower Extremity General Exercises - Upper Extremity Shoulder Flexion: AROM;Both;10 reps Shoulder Horizontal ABduction: AROM;Both;10 reps;Theraband Theraband Level (Shoulder Horizontal Abduction): Level 1 (Yellow) Shoulder Horizontal ADduction: AROM;Both;10 reps;Theraband Theraband Level (Shoulder Horizontal Adduction): Level 1 (Yellow) General Exercises - Lower Extremity Ankle Circles/Pumps: AROM;Both;10 reps;Supine Hip ABduction/ADduction: AROM;Both;10 reps;Supine Straight Leg Raises: AROM;Left;10 reps;Supine   Shoulder Instructions       General Comments      Pertinent Vitals/ Pain       Pain Assessment: Faces Faces Pain Scale: Hurts little more Pain Location: R LE with movement (gout pain) Pain Descriptors / Indicators: Discomfort;Tender Pain Intervention(s): Monitored during session;Repositioned  Home Living  Prior Functioning/Environment              Frequency  Min 2X/week        Progress Toward Goals  OT Goals(current goals can now be found in the care plan section)  Progress towards OT goals: Progressing toward goals  Acute Rehab OT Goals Patient Stated Goal: none stated; agreeable to snf OT Goal Formulation: With patient Time For Goal Achievement: 12/01/18 Potential to Achieve Goals: Fair ADL Goals Pt Will Transfer to Toilet: with mod assist;with +2 assist;stand pivot transfer;bedside commode Pt Will Perform Toileting - Clothing Manipulation and hygiene:  with max assist;with 2+ total assist;sit to/from stand Additional ADL Goal #1: pt will perform grooming and UB adls with set up from eob Additional ADL Goal #2: pt will go from sit to stand with mod +2 and maintain with min guard for 2 minutes for adls Additional ADL Goal #3: pt will be independent with written HEP, level one RUE and AROM LUE  Plan Discharge plan remains appropriate    Co-evaluation                 AM-PAC OT "6 Clicks" Daily Activity     Outcome Measure   Help from another person eating meals?: A Little Help from another person taking care of personal grooming?: A Little Help from another person toileting, which includes using toliet, bedpan, or urinal?: Total Help from another person bathing (including washing, rinsing, drying)?: A Lot Help from another person to put on and taking off regular upper body clothing?: A Lot Help from another person to put on and taking off regular lower body clothing?: Total 6 Click Score: 12    End of Session    OT Visit Diagnosis: Muscle weakness (generalized) (M62.81);Pain;Other abnormalities of gait and mobility (R26.89) Pain - Right/Left: Right Pain - part of body: Ankle and joints of foot;Knee;Hip   Activity Tolerance Patient tolerated treatment well   Patient Left in bed;with call bell/phone within reach   Nurse Communication Mobility status        Time: 1417-1440 OT Time Calculation (min): 23 min  Charges: OT General Charges $OT Visit: 1 Visit OT Treatments $Self Care/Home Management : 8-22 mins $Therapeutic Activity: 8-22 mins  Lou Cal, OT [facility] Pager [phone] Office [phone]   Raymondo Band 11/28/2018, 3:04 PM

## 2018-11-28 NOTE — Progress Notes (Signed)
PROGRESS NOTE    Emily Hughes  TFT:[id number] DOB: August 17, 1940 DOA: 11/08/2018 PCP: Susy Frizzle, MD  Brief Narrative: 78 year old African-American female with a past medical history significant for but not limited to diabetes mellitus type 2, CKD stage IV, hypothyroidism, CAD, GERD, hypertension, hyperlipidemia, history of seizure disorder, ? Atrial fibrillation, long with comorbidities who presented to the emergency room with a 3-day history of pain and swelling in her buttocks along with a fever of 102. Family reportedly gave her some Tylenol that helped her improve her temperature down to 99 but she is found to have elevated blood pressures for last few days when in the 400s. She has not been taking her insulin for last 2 days and has not been checking her glucose regularly. On admission she stated that the left buttock pain lasted for last few days and was severe 10 out of 10 and reportedly was a boil that then progressed.  She was worked up and admitted for sepsis secondary to abscess of the left buttock as well as DKA. She was placed in the stepdown unit and was placed on glucose stabilizer and has improvement in her blood sugars. She was incidentally noted to be in atrial fibrillation/A Flutter which then converted to NSR a few days ago. Cardiology was consulted for preoperative clearance and patient was taken for incision and drainage of her abscess on her left buttock and she is POD2. DKA improved and she was transitioned to Long Acting Insulin even though Her CO2 is not >20 given that she has chronic Metabolic Acidosis 2/2 to her Renal Disease and is on Sodium Bicarbonate. She tolerated her Diet without issues but complained of some Abdominal Pain.   Liver function tests were elevated and right upper quadrant ultrasound was done and showed possible acute cholecystitis. I discussed with general surgery Dr. Jens Som who recommended obtaining a HIDA scan however HIDA scan with EF  is not able to be done at this time and if EF is needed to be done will come will be on Tuesday per nursing. So will obtain a HIDA scan without the EF currently. And this HIDA scan was normal hepatobiliary scan so Dr. Windle Guard was less inclined for acute cholecystitis and felt that her LFTs being elevated could be reactionary. We will continue to monitor closely and antibiotics have been de-escalated to just IV cefepime Flagyl for now and will continue to de-escalate accordingly.   Assessment & Plan:   Principal Problem:   Sepsis ([place]) Active Problems:   Essential hypertension   CAD (coronary artery disease)   GERD (gastroesophageal reflux disease)   DKA (diabetic ketoacidoses) (HCC)   Chronic kidney disease (CKD), stage IV (severe) (HCC)   Type 2 diabetes mellitus with ketoacidosis without coma (HCC)   Abscess of left buttock   Atrial fibrillation with RVR (HCC)   Elevated troponin   Abnormal LFTs   Acute renal failure superimposed on stage 4 chronic kidney disease (HCC)   Acute metabolic encephalopathy   Cellulitis and abscess of buttock   Acute urinary retention   Acute idiopathic gout of multiple sites   Severe protein-calorie malnutrition (HCC)   1sepsis secondary to left buttocks/gluteal abscess status post incision and drainageof abscess that is post I&D 11/09/2018. Patient continues to get hydrotherapy daily with surgery following for possible debridement if needed. Blood cultures have been negative. COVID-19 negative MRSA PCR negative urine culture negative. Patient received IV antibiotics for 10 days.  Patient family would like her to go to  [facility]  Review of hydrotherapy notes shows that she has 70% granulation tissue with 15% black eschar and 15% yellow exudate.  Looking at the previous notes from last week this remains the same.  2. Diabetes mellitus type 2/DKA Patient had presented in DKA felt secondary to problem #1. Hemoglobin A1c 8.4.Sugars now back up to  200 range will increase Lantus and NovoLog..  3New onset/Transient atrial fibrillation/atrial flutter with variable block status post conversion to normal sinus rhythm with transient third-degree AV block.?This atrial fibrillation is not permanent paroxysmal or chronic it was just a transient A. fib. Felt likely in the setting of sepsis secondary to problem #1. Patient converted to normal sinus rhythm with a short episode of third-degree AV block overnight while asleep thought possibly secondary to AV nodal blocking drugs and sleep apnea. Patient initially on a Cardizem drip and subsequently transitioned to oral Cardizem which has subsequently been discontinued. Patient seen in consultation by cardiology who feels no need for anticoagulation at this time given short duration and need for surgical intervention. Continue current regimen of Toprol-XL 50 mg daily. Appreciate cardiology input and recommendations.   4Hypertensionblood pressure better.Continue Toprol and clonidine Norvasc has been stopped yesterday.   5Elevated troponin Felt secondary to demand ischemia in the setting of sepsis, elevated lactic acid and acute on chronic kidney failure. EKG with question of flutter versus sinus tach with PACs. Repeat EKG showed a normal sinus rhythm and evidence of LVH with no signs of ischemia noted. Patient seen by cardiology who felt no further ischemic work-up needed at this time. Continue aspirin 81 mg daily, Toprol-XL, statin. Follow  7Acute on chronic kidney disease stage IV/chronic metabolic acidosis Felt secondary to prerenal azotemia in the setting of sepsis. Patient on admission noted to have a creatinine of 4.76. Patient placed on IV fluids which have subsequently been discontinued. Renal function improved and currently close to baseline. Creatinine at 2.76Continue calcitriol. Acidosis improved with increased bicarb tablets of 1300 mg p.o. 3 times daily. Patient  with some lower extremity edema. Placed on Lasix 40 mg orally twice daily. Patient was on Lasix 80 mg in the morning and 40 mg at bedtime as her home medication dose. Outpatient follow-up with nephrology.   8. Hypothyroidism TSH of 1.815 on 11/09/2018. Synthroid.   9. Coronary artery disease Currently stable. Patient denies any acute chest pain. Patient was seen by cardiology during the hospitalization secondary to new onset atrial flutter with variable block. Continue current cardiac medications of aspirin, Toprol-XL, statin, Norvasc. Outpatient follow-up with cardiology.  10. Hyponatremia stable at 133 11. Hypermagnesemiafollow labs tomorrow.Magnesium 2.2.   12. Acute encephalopathy/somnolence and lethargy-is more awake and alert today asked me to talk to her sister and update her sister..  13. Morbid obesity Weight loss and dietary counseling given.  14. Right kneegoutpatient with right knee swelling decreased range of motion painful ambulation with history of gout was on allopurinol 300 mg daily which has been stopped and was started on febuxostat 11/25/2018. Appreciate Dr. Jess Barters input. She reports her pain is way better than yesterday. Continue Uloric.    15. Abnormal LFTsAST ALT 96 and 82 follow-up as needed  16. Normocytic anemia/anemia of chronic kidney disease Likely dilutional and postoperative drop.Hemoglobin stable at 8.2 she received 1 unit of blood transfusion.   17. Leukocytosisresolved  18. gastroesophageal reflux disease Continue PPI.  19. Loose stoolsresolved after stopping stool softeners. Now patient has constipation MiraLAX restarted and senna.  20. Hyperkalemia5.3continue Veltassa  21. urinary retention  Foley catheter placed back in due to urinary retention. Continue Flomax. DC Foley catheter  DVT prophylaxis:SCDs Code Status:Full Family Communication:dw sister [phone] sister has picked up  [facility] for discharge. Disposition Plan:SNF on dischargewhen okay with general surgery..  Consultants:  Cardiology: Dr. Meda Coffee 11/09/2018  General surgery: Dr. Marlou Starks III 11/08/2018 Procedures:  CT head 11/12/2018  CT pelvis 11/08/2018  Chest x-ray 11/08/2018, 11/12/2018, 11/13/2018, 11/15/2018  Plain films of the left foot 11/12/2018  Limited echocardiogram 11/11/2018  Right upper quadrant ultrasound 11/11/2018  Incision and drainage of left buttocks abscess 11/09/2018 per Dr. Ninfa Linden  EEG 11/15/2018  Antimicrobials:   Oral Flagyl 11/13/2018>>>> 11/14/2018  IV Unasyn 11/14/2018>>>> 11/19/2018  IV cefepime 11/08/2018>>>> 11/14/2018  IV Flagyl 11/08/2018>>>>> 11/13/2018 IV vancomycin 11/08/2018>>>> 11/11/2018  Nutrition Problem: Increased nutrient needs Etiology: acute illness, wound healing     Signs/Symptoms: estimated needs    Interventions: Juven, Ensure Enlive (each supplement provides 350kcal and 20 grams of protein)  Estimated body mass index is 49.95 kg/m as calculated from the following:   Height as of this encounter: 5\' 3"  (1.6 m).   Weight as of this encounter: 127.9 kg.    Subjective: Doing in bed reports pain in the right lower extremity is better able to move the knee better  Objective: Vitals:   11/27/18 2113 11/28/18 0435 11/28/18 0600 11/28/18 1025  BP: (!) 114/58 129/69  133/60  Pulse: 67 66  72  Resp: 16 20    Temp: 98.2 F (36.8 C) 98.2 F (36.8 C)    TempSrc: Oral Oral    SpO2: 98% 97%    Weight:   127.9 kg   Height:        Intake/Output Summary (Last 24 hours) at 11/28/2018 1259 Last data filed at 11/28/2018 1240 Gross per 24 hour  Intake 1360 ml  Output 350 ml  Net 1010 ml   Filed Weights   11/26/18 0456 11/27/18 0553 11/28/18 0600  Weight: 130.2 kg 130.6 kg 127.9 kg    Examination:  General exam: Appears calm and comfortable  Respiratory system: Clear to auscultation. Respiratory effort normal. Cardiovascular system: S1 & S2  heard, RRR. No JVD, murmurs, rubs, gallops or clicks. No pedal edema. Gastrointestinal system: Abdomen is nondistended, soft and nontender. No organomegaly or masses felt. Normal bowel sounds heard. Central nervous system: Alert and oriented. No focal neurological deficits. Extremities: Trace bilateral pitting edema Skin: No rashes, lesions or ulcers Psychiatry: Judgement and insight appear normal. Mood & affect appropriate.     Data Reviewed: I have personally reviewed following labs and imaging studies  CBC: Recent Labs  Lab 11/22/18 0615 11/23/18 0702 11/25/18 0558 11/27/18 0541  WBC 9.6 10.3 10.9* 9.3  HGB 6.9* 7.9* 8.7* 8.2*  HCT 21.8* 25.4* 26.8* 25.4*  MCV 86.2 86.7 87.0 87.3  PLT 417* 413* 402* 809   Basic Metabolic Panel: Recent Labs  Lab 11/22/18 0615 11/23/18 0702 11/25/18 0558 11/27/18 0541  NA 131* 133* 134* 133*  K 5.5* 5.5* 5.3* 5.3*  CL 102 104 100 99  CO2 21* 22 24 24   GLUCOSE 258* 195* 84 319*  BUN 78* 75* 70* 79*  CREATININE 2.86* 2.72* 2.75* 2.73*  CALCIUM 8.6* 8.7* 9.1 9.0  MG  --  2.3 2.2  --    GFR: Estimated Creatinine Clearance: 22.9 mL/min (A) (by C-G formula based on SCr of 2.73 mg/dL (H)). Liver Function Tests: Recent Labs  Lab 11/23/18 0702 11/25/18 0558 11/27/18 0541  AST 67* 63* 96*  ALT 65* 60* 82*  ALKPHOS 71 79 71  BILITOT 0.8 <0.1* <0.1*  PROT 5.9* 6.4* 5.8*  ALBUMIN 1.9* 2.0* 1.8*   No results for input(s): LIPASE, AMYLASE in the last 168 hours. No results for input(s): AMMONIA in the last 168 hours. Coagulation Profile: No results for input(s): INR, PROTIME in the last 168 hours. Cardiac Enzymes: No results for input(s): CKTOTAL, CKMB, CKMBINDEX, TROPONINI in the last 168 hours. BNP (last 3 results) No results for input(s): PROBNP in the last 8760 hours. HbA1C: No results for input(s): HGBA1C in the last 72 hours. CBG: Recent Labs  Lab 11/27/18 1210 11/27/18 1706 11/27/18 2109 11/28/18 0741 11/28/18 1216    GLUCAP 253* 292* 308* 230* 213*   Lipid Profile: No results for input(s): CHOL, HDL, LDLCALC, TRIG, CHOLHDL, LDLDIRECT in the last 72 hours. Thyroid Function Tests: No results for input(s): TSH, T4TOTAL, FREET4, T3FREE, THYROIDAB in the last 72 hours. Anemia Panel: No results for input(s): VITAMINB12, FOLATE, FERRITIN, TIBC, IRON, RETICCTPCT in the last 72 hours. Sepsis Labs: No results for input(s): PROCALCITON, LATICACIDVEN in the last 168 hours.  No results found for this or any previous visit (from the past 240 hour(s)).       Radiology Studies: No results found.      Scheduled Meds:  aspirin EC  81 mg Oral Daily   calcitRIOL  0.25 mcg Oral Daily   cloNIDine  0.1 mg Oral BID   febuxostat  40 mg Oral Daily   feeding supplement (ENSURE ENLIVE)  237 mL Oral BID BM   furosemide  40 mg Oral BID   insulin aspart  0-15 Units Subcutaneous TID WC   insulin aspart  5 Units Subcutaneous TID WC   insulin glargine  25 Units Subcutaneous Daily   levothyroxine  50 mcg Oral Q0600   mouth rinse  15 mL Mouth Rinse BID   metoprolol succinate  50 mg Oral Daily   nutrition supplement (JUVEN)  1 packet Oral BID BM   pantoprazole  40 mg Oral Q0600   patiromer  8.4 g Oral Daily   polyethylene glycol  17 g Oral Daily   pravastatin  80 mg Oral QHS   predniSONE  20 mg Oral Q breakfast   sodium bicarbonate  1,300 mg Oral TID   tamsulosin  0.4 mg Oral QPC breakfast   Continuous Infusions:   LOS: 20 days     Georgette Shell, MD [facility]  If 7PM-7AM, please contact night-coverage [email or web address] Password [facility] 11/28/2018, 12:59 PM

## 2018-11-28 NOTE — Progress Notes (Signed)
HYDROTHERAPY TREATMENT     11/28/18 1200  Subjective Assessment  Subjective "okay"  Patient and Family Stated Goals less pain  Date of Onset  (abscess present on admission)  Prior Treatments s/p I&D 5/7  Evaluation and Treatment  Evaluation and Treatment Procedures Explained to Patient/Family Yes  Evaluation and Treatment Procedures agreed to  Wound / Incision (Open or Dehisced) 11/13/18 Incision - Open Buttocks Left L buttocks wound s/p I&D by Surgery 11/09/18. **HYDRO/PT**  Date First Assessed/Time First Assessed: 11/13/18 1500   Wound Type: Incision - Open  Location: Buttocks  Location Orientation: Left  Wound Description (Comments): L buttocks wound s/p I&D by Surgery 11/09/18. **HYDRO/PT**  Dressing Type ABD;Barrier Film (skin prep);Gauze ;Moist to dry;Impregnated gauze (petrolatum) (Kerlix packing)  Dressing Changed Changed  Dressing Status Old drainage  Dressing Change Frequency Twice a day  Site / Wound Assessment Pink;Yellow;Brown;Black;Bleeding  % Wound base Red or Granulating 70%  % Wound base Yellow/Fibrinous Exudate 15%  % Wound base Black/Eschar 15%  Peri-wound Assessment Excoriated (good improvement from initial eval)  Wound Length (cm) 8 cm  Wound Width (cm) 5 cm  Wound Depth (cm) 4 cm  Wound Volume (cm^3) 160 cm^3  Wound Surface Area (cm^2) 40 cm^2  Margins Unattached edges (unapproximated)  Drainage Amount Moderate  Drainage Description Serosanguineous  Treatment Debridement (Selective);Hydrotherapy (Pulse lavage);Packing (Saline gauze)  Hydrotherapy  Pulsed lavage therapy - wound location L medial buttocks  Pulsed Lavage with Suction (psi) 8 psi  Pulsed Lavage with Suction - Normal Saline Used 1000 mL  Pulsed Lavage Tip Tip with splash shield  Selective Debridement  Selective Debridement - Location L buttock  Selective Debridement - Tools Used Forceps;Scissors  Selective Debridement - Tissue Removed brown/black tissue and yellow slough  Wound Therapy -  Assess/Plan/Recommendations  Wound Therapy - Clinical Statement 78 yo female admitted with L gluteal/buttock abscess. S/P I&D 11/09/18.   Wound Therapy - Functional Problem List limited mobility  Factors Delaying/Impairing Wound Healing Diabetes Mellitus;Multiple medical problems;Immobility;Incontinence  Hydrotherapy Plan Debridement;Dressing change;Patient/family education;Pulsatile lavage with suction  Wound Therapy - Frequency 6X / week  Wound Therapy - Current Recommendations Case manager/social work  Wound Therapy - Follow Up Recommendations Skilled nursing facility  Wound Plan Continue wound care and dressing changes. May need to consider using Santyl to help remove necrotic tissue/slough. May be able to d/c to SNF with nursing managing wound care and dressing changes.  Wound Therapy Goals - Improve the function of patient's integumentary system by progressing the wound(s) through the phases of wound healing by:  Decrease Necrotic Tissue to 20%  Decrease Necrotic Tissue - Progress Progressing toward goal  Increase Granulation Tissue to 80%  Increase Granulation Tissue - Progress Progressing toward goal  Improve Drainage Characteristics Min  Goals/treatment plan/discharge plan were made with and agreed upon by patient/family Yes  Time For Goal Achievement 2 weeks    Emily Hughes, [place] Pager: [phone] Office: [phone]

## 2018-11-28 NOTE — TOC Progression Note (Signed)
Transition of Care [facility]) - Progression Note    Patient Details  Name: Sheronica Corey MRN: [id number] Date of Birth: 05-03-1941  Transition of Care [facility]) CM/SW Contact  Servando Snare, [place] Phone Number: 11/28/2018, 10:24 AM  Clinical Narrative:   LCSW started insurance auth. Patient has an upfront copay that she is aware of and willing to pay.     Expected Discharge Plan: [place] Barriers to Discharge: No Barriers Identified  Expected Discharge Plan and Services Expected Discharge Plan: [place] Choice: [place] arrangements for the past 2 months: Single Family Home                                       Social Determinants of Health (SDOH) Interventions    Readmission Risk Interventions No flowsheet data found.

## 2018-11-28 NOTE — TOC Progression Note (Signed)
Transition of Care [facility]) - Progression Note    Patient Details  Name: Anastasiya Gowin MRN: [id number] Date of Birth: [birth year]  Transition of Care [facility]) CM/SW Contact  Servando Snare, [place] Phone Number: 11/28/2018, 2:16 PM  Clinical Narrative:   LCSW followed up with Crystal at [facility] advantage. Case has been assigned but no determination yet. Authorization in progress.     Expected Discharge Plan: [place] Barriers to Discharge: No Barriers Identified  Expected Discharge Plan and Services Expected Discharge Plan: [place] Choice: [place] arrangements for the past 2 months: Single Family Home                                       Social Determinants of Health (SDOH) Interventions    Readmission Risk Interventions No flowsheet data found.

## 2018-11-29 DIAGNOSIS — Z6841 Body Mass Index (BMI) 40.0 and over, adult: Secondary | ICD-10-CM | POA: Diagnosis not present

## 2018-11-29 DIAGNOSIS — S31829A Unspecified open wound of left buttock, initial encounter: Secondary | ICD-10-CM | POA: Diagnosis not present

## 2018-11-29 DIAGNOSIS — R531 Weakness: Secondary | ICD-10-CM | POA: Diagnosis not present

## 2018-11-29 DIAGNOSIS — Z23 Encounter for immunization: Secondary | ICD-10-CM | POA: Diagnosis not present

## 2018-11-29 DIAGNOSIS — R2689 Other abnormalities of gait and mobility: Secondary | ICD-10-CM | POA: Diagnosis not present

## 2018-11-29 DIAGNOSIS — I1310 Hypertensive heart and chronic kidney disease without heart failure, with stage 1 through stage 4 chronic kidney disease, or unspecified chronic kidney disease: Secondary | ICD-10-CM | POA: Diagnosis not present

## 2018-11-29 DIAGNOSIS — D126 Benign neoplasm of colon, unspecified: Secondary | ICD-10-CM | POA: Diagnosis not present

## 2018-11-29 DIAGNOSIS — Z741 Need for assistance with personal care: Secondary | ICD-10-CM | POA: Diagnosis not present

## 2018-11-29 DIAGNOSIS — E785 Hyperlipidemia, unspecified: Secondary | ICD-10-CM | POA: Diagnosis not present

## 2018-11-29 DIAGNOSIS — E11622 Type 2 diabetes mellitus with other skin ulcer: Secondary | ICD-10-CM | POA: Diagnosis not present

## 2018-11-29 DIAGNOSIS — L0231 Cutaneous abscess of buttock: Secondary | ICD-10-CM | POA: Diagnosis not present

## 2018-11-29 DIAGNOSIS — E43 Unspecified severe protein-calorie malnutrition: Secondary | ICD-10-CM | POA: Diagnosis not present

## 2018-11-29 DIAGNOSIS — E1122 Type 2 diabetes mellitus with diabetic chronic kidney disease: Secondary | ICD-10-CM | POA: Diagnosis not present

## 2018-11-29 DIAGNOSIS — R0902 Hypoxemia: Secondary | ICD-10-CM | POA: Diagnosis not present

## 2018-11-29 DIAGNOSIS — D649 Anemia, unspecified: Secondary | ICD-10-CM | POA: Diagnosis not present

## 2018-11-29 DIAGNOSIS — I129 Hypertensive chronic kidney disease with stage 1 through stage 4 chronic kidney disease, or unspecified chronic kidney disease: Secondary | ICD-10-CM | POA: Diagnosis not present

## 2018-11-29 DIAGNOSIS — A419 Sepsis, unspecified organism: Secondary | ICD-10-CM | POA: Diagnosis not present

## 2018-11-29 DIAGNOSIS — R945 Abnormal results of liver function studies: Secondary | ICD-10-CM | POA: Diagnosis not present

## 2018-11-29 DIAGNOSIS — I10 Essential (primary) hypertension: Secondary | ICD-10-CM | POA: Diagnosis not present

## 2018-11-29 DIAGNOSIS — R7989 Other specified abnormal findings of blood chemistry: Secondary | ICD-10-CM | POA: Diagnosis not present

## 2018-11-29 DIAGNOSIS — Z1331 Encounter for screening for depression: Secondary | ICD-10-CM | POA: Diagnosis not present

## 2018-11-29 DIAGNOSIS — E8809 Other disorders of plasma-protein metabolism, not elsewhere classified: Secondary | ICD-10-CM | POA: Diagnosis not present

## 2018-11-29 DIAGNOSIS — M12812 Other specific arthropathies, not elsewhere classified, left shoulder: Secondary | ICD-10-CM | POA: Diagnosis not present

## 2018-11-29 DIAGNOSIS — R6520 Severe sepsis without septic shock: Secondary | ICD-10-CM | POA: Diagnosis not present

## 2018-11-29 DIAGNOSIS — Z7401 Bed confinement status: Secondary | ICD-10-CM | POA: Diagnosis not present

## 2018-11-29 DIAGNOSIS — A4151 Sepsis due to Escherichia coli [E. coli]: Secondary | ICD-10-CM | POA: Diagnosis not present

## 2018-11-29 DIAGNOSIS — R1319 Other dysphagia: Secondary | ICD-10-CM | POA: Diagnosis not present

## 2018-11-29 DIAGNOSIS — M1009 Idiopathic gout, multiple sites: Secondary | ICD-10-CM | POA: Diagnosis not present

## 2018-11-29 DIAGNOSIS — E039 Hypothyroidism, unspecified: Secondary | ICD-10-CM | POA: Diagnosis not present

## 2018-11-29 DIAGNOSIS — M6281 Muscle weakness (generalized): Secondary | ICD-10-CM | POA: Diagnosis not present

## 2018-11-29 DIAGNOSIS — I482 Chronic atrial fibrillation, unspecified: Secondary | ICD-10-CM | POA: Diagnosis not present

## 2018-11-29 DIAGNOSIS — L98415 Non-pressure chronic ulcer of buttock with muscle involvement without evidence of necrosis: Secondary | ICD-10-CM | POA: Diagnosis not present

## 2018-11-29 DIAGNOSIS — N184 Chronic kidney disease, stage 4 (severe): Secondary | ICD-10-CM | POA: Diagnosis not present

## 2018-11-29 DIAGNOSIS — M2550 Pain in unspecified joint: Secondary | ICD-10-CM | POA: Diagnosis not present

## 2018-11-29 DIAGNOSIS — L0390 Cellulitis, unspecified: Secondary | ICD-10-CM | POA: Diagnosis not present

## 2018-11-29 DIAGNOSIS — I959 Hypotension, unspecified: Secondary | ICD-10-CM | POA: Diagnosis not present

## 2018-11-29 DIAGNOSIS — S31829D Unspecified open wound of left buttock, subsequent encounter: Secondary | ICD-10-CM | POA: Diagnosis not present

## 2018-11-29 DIAGNOSIS — Z87891 Personal history of nicotine dependence: Secondary | ICD-10-CM | POA: Diagnosis not present

## 2018-11-29 DIAGNOSIS — I2510 Atherosclerotic heart disease of native coronary artery without angina pectoris: Secondary | ICD-10-CM | POA: Diagnosis not present

## 2018-11-29 DIAGNOSIS — N179 Acute kidney failure, unspecified: Secondary | ICD-10-CM | POA: Diagnosis not present

## 2018-11-29 DIAGNOSIS — T8189XA Other complications of procedures, not elsewhere classified, initial encounter: Secondary | ICD-10-CM | POA: Diagnosis not present

## 2018-11-29 LAB — GLUCOSE, CAPILLARY
Glucose-Capillary: 160 mg/dL — ABNORMAL HIGH (ref 70–99)
Glucose-Capillary: 141 mg/dL — ABNORMAL HIGH (ref 70–99)

## 2018-11-29 MED ORDER — COLLAGENASE 250 UNIT/GM EX OINT: TOPICAL_OINTMENT | Freq: Every day | CUTANEOUS | 0 refills | Status: DC

## 2018-11-29 MED ORDER — ONDANSETRON HCL 4 MG PO TABS: 4.0000 mg | ORAL_TABLET | Freq: Four times a day (QID) | ORAL | 0 refills | Status: DC | PRN

## 2018-11-29 MED ORDER — OXYCODONE HCL 5 MG PO TABS: 5.0000 mg | ORAL_TABLET | Freq: Four times a day (QID) | ORAL | 0 refills | Status: DC | PRN

## 2018-11-29 MED ORDER — POLYETHYLENE GLYCOL 3350 17 G PO PACK: 17.0000 g | PACK | Freq: Every day | ORAL | 0 refills | Status: DC

## 2018-11-29 MED ORDER — FEBUXOSTAT 40 MG PO TABS: 40.0000 mg | ORAL_TABLET | Freq: Every day | ORAL | 0 refills | Status: DC

## 2018-11-29 MED ORDER — INSULIN ASPART 100 UNIT/ML ~~LOC~~ SOLN: 8.0000 [IU] | Freq: Three times a day (TID) | SUBCUTANEOUS | 11 refills | Status: DC

## 2018-11-29 MED ORDER — INSULIN GLARGINE 100 UNIT/ML ~~LOC~~ SOLN: 32.0000 [IU] | Freq: Every day | SUBCUTANEOUS | 11 refills | Status: DC

## 2018-11-29 MED ORDER — PANTOPRAZOLE SODIUM 40 MG PO TBEC: 40.0000 mg | DELAYED_RELEASE_TABLET | Freq: Every day | ORAL | 0 refills | Status: DC

## 2018-11-29 MED ORDER — TAMSULOSIN HCL 0.4 MG PO CAPS: 0.4000 mg | ORAL_CAPSULE | Freq: Every day | ORAL | 0 refills | Status: DC

## 2018-11-29 MED ORDER — SENNA 8.6 MG PO TABS: 2.0000 | ORAL_TABLET | Freq: Every day | ORAL | 0 refills | Status: DC

## 2018-11-29 MED ORDER — ACETAMINOPHEN 325 MG PO TABS: 650.0000 mg | ORAL_TABLET | Freq: Four times a day (QID) | ORAL | Status: DC | PRN

## 2018-11-29 NOTE — Care Management Important Message (Signed)
Important Message  Patient Details IM Letter given to Servando Snare to present to the Patient Name: Michie Molnar MRN: [id number] Date of Birth: [birth year]   Medicare Important Message Given:  Yes    [name] 11/29/2018, 11:01 AM

## 2018-11-29 NOTE — TOC Transition Note (Signed)
Transition of Care [facility]) - CM/SW Discharge Note   Patient Details  Name: Emily Hughes MRN: [id number] Date of Birth: 1941/01/11  Transition of Care [facility]) CM/SW Contact:  Servando Snare, LCSW Phone Number: 11/29/2018, 11:01 AM   Clinical Narrative:   Patient has bed at [facility]. LCSW faxed dc docs.to facility. PTAR arranged for 13:00. Facility ask that any meds that we would thrown away be sent with patient.   RN report #: [phone]    Final next level of care: Skilled Nursing Facility Barriers to Discharge: No Barriers Identified   Patient Goals and CMS Choice Patient states their goals for this hospitalization and ongoing recovery are:: "get my strength back" CMS Medicare.gov Compare Post Acute Care list provided to:: Patient Choice offered to / list presented to : (will present to pt and sister once offers available)  Discharge Placement              Patient chooses bed at: [facility] Patient to be transferred to facility by: EMS Name of family member notified: Mary Patient and family notified of of transfer: 11/29/18  Discharge Plan and Services     Post Acute Care Choice: [place]                               Social Determinants of Health (SDOH) Interventions     Readmission Risk Interventions No flowsheet data found.

## 2018-11-29 NOTE — Progress Notes (Signed)
Attempted to call report to [facility] three times.  PTAR aware.  Patient transported patient there.  Virginia Rochester, RN

## 2018-11-29 NOTE — Discharge Summary (Signed)
Physician Discharge Summary  Emily Hughes VPX:[id number] DOB: 1940/09/06 DOA: 11/08/2018  PCP: Susy Frizzle, MD  Admit date: 11/08/2018 Discharge date: 11/29/2018  Admitted From: home Disposition:  home  Recommendations for Outpatient Follow-up:  1. Follow up with PCP in 1-2 weeks 2. Please obtain BMP/CBC in one week 3.   Home Health none Equipment/Devices:none Discharge Condition:stable  CODE STATUS:dnr Diet recommendation:carb modified cardiac  Brief/Interim Summary:78 year old African-American female with a past medical history significant for but not limited to diabetes mellitus type 2, CKD stage IV, hypothyroidism, CAD, GERD, hypertension, hyperlipidemia, history of seizure disorder, ? Atrial fibrillation, long with comorbidities who presented to the emergency room with a 3-day history of pain and swelling in her buttocks along with a fever of 102. Family reportedly gave her some Tylenol that helped her improve her temperature down to 99 but she is found to have elevated blood pressures for last few days when in the 400s. She has not been taking her insulin for last 2 days and has not been checking her glucose regularly. On admission she stated that the left buttock pain lasted for last few days and was severe 10 out of 10 and reportedly was a boil that then progressed.  She was worked up and admitted for sepsis secondary to abscess of the left buttock as well as DKA. She was placed in the stepdown unit and was placed on glucose stabilizer and has improvement in her blood sugars. She was incidentally noted to be in atrial fibrillation/A Flutter which then converted to NSR a few days ago. Cardiology was consulted for preoperative clearance and patient was taken for incision and drainage of her abscess on her left buttock and she is POD2. DKA improved and she was transitioned to Long Acting Insulin even though Her CO2 is not >20 given that she has chronic Metabolic Acidosis 2/2 to  her Renal Disease and is on Sodium Bicarbonate. She tolerated her Diet without issues but complained of some Abdominal Pain.   Liver function tests were elevated and right upper quadrant ultrasound was done and showed possible acute cholecystitis. I discussed with general surgery Dr. Jens Som who recommended obtaining a HIDA scan however HIDA scan with EF is not able to be done at this time and if EF is needed to be done will come will be on Tuesday per nursing. So will obtain a HIDA scan without the EF currently. And this HIDA scan was normal hepatobiliary scan so Dr. Windle Guard was less inclined for acute cholecystitis and felt that her LFTs being elevated could be reactionary. We will continue to monitor closely and antibiotics have been de-escalated to just IV cefepime Flagyl for now and will continue to de-escalate accordingly  Discharge Diagnoses:  Principal Problem:   Sepsis ([place]) Active Problems:   Essential hypertension   CAD (coronary artery disease)   GERD (gastroesophageal reflux disease)   DKA (diabetic ketoacidoses) (HCC)   Chronic kidney disease (CKD), stage IV (severe) (HCC)   Type 2 diabetes mellitus with ketoacidosis without coma (HCC)   Abscess of left buttock   Atrial fibrillation with RVR (HCC)   Elevated troponin   Abnormal LFTs   Acute renal failure superimposed on stage 4 chronic kidney disease (HCC)   Acute metabolic encephalopathy   Cellulitis and abscess of buttock   Acute urinary retention   Acute idiopathic gout of multiple sites   Severe protein-calorie malnutrition (HCC)   1sepsis secondary to left buttocks/gluteal abscess status post incision and drainageof abscess that is  post I&D 11/09/2018. Patient continues to get hydrotherapy daily with surgery following for possible debridement if needed. Blood cultures have been negative. COVID-19 negative MRSA PCR negative urine culture negative. Patient received IV antibiotics for 10 days.Patient family  would like her to go to [facility].  Review of hydrotherapy notes shows that she has 70% granulation tissue with 15% black eschar and 15% yellow exudate.  Patient will be discharged to [facility] today.  Wound care team at [facility] will follow this patient's wound.  She needs dressing changes twice a day with Santyl applied to the affected areas in the right lower quadrant from 3-6 o'clock where there is more of eschar.  If she is unable to stand please do sitz bath if she is able to stand shower her with water and soap.  She must not lay on that wound she needs to be repositioned every 2 hours.  2. Diabetes mellitus type 2/DKA Patient had presented in DKA felt secondary to problem #1. Hemoglobin A1c 8.4.Sugars now back up to 200 range will increase Lantus and NovoLog..  3New onset/Transient atrial fibrillation/atrial flutter with variable block status post conversion to normal sinus rhythm with transient third-degree AV block.?This atrial fibrillation is not permanent paroxysmal or chronic it was just a transient A. fib. Felt likely in the setting of sepsis secondary to problem #1. Patient converted to normal sinus rhythm with a short episode of third-degree AV block overnight while asleep thought possibly secondary to AV nodal blocking drugs and sleep apnea. Patient initially on a Cardizem drip and subsequently transitioned to oral Cardizem which has subsequently been discontinued. Patient seen in consultation by cardiology who feels no need for anticoagulation at this time given short duration and need for surgical intervention. Continue current regimen of Toprol-XL 50 mg daily.  Patient was seen by cardiology.  4Hypertensionblood pressure better. Continue Lasix metoprolol and Cardizem.  I have stopped her clonidine and Norvasc due to soft blood pressure.   5Elevated troponin Felt secondary to demand ischemia in the setting of sepsis, elevated lactic acid and acute on  chronic kidney failure. EKG with question of flutter versus sinus tach with PACs. Repeat EKG showed a normal sinus rhythm and evidence of LVH with no signs of ischemia noted. Patient seen by cardiology who felt no further ischemic work-up needed at this time. Continue aspirin 81 mg daily, Toprol-XL, statin. Follow  7Acute on chronic kidney disease stage IV/chronic metabolic acidosis Felt secondary to prerenal azotemia in the setting of sepsis. Patient on admission noted to have a creatinine of 4.76. Patient placed on IV fluids which have subsequently been discontinued. Renal function improved and currently close to baseline. Creatinine at 2.76Continue calcitriol. Acidosis improved with increased bicarb tablets of 1300 mg p.o. 3 times daily. Patient with some lower extremity edema. Placed on Lasix 40 mg orally twice daily. Patient was on Lasix 80 mg in the morning and 40 mg at bedtime as her home medication dose. Outpatient follow-up with nephrology.   8. Hypothyroidism TSH of 1.815 on 11/09/2018. Synthroid.   9. Coronary artery disease Currently stable. Patient denies any acute chest pain. Patient was seen by cardiology during the hospitalization secondary to new onset atrial flutter with variable block. Continue current cardiac medications of aspirin, Toprol-XL, statin, Norvasc. Outpatient follow-up with cardiology.  10. Hyponatremiastable at 133 11. Hypermagnesemia magnesium 2.2 follow-up at the nursing home. 12. Acute encephalopathy/somnolence and lethargy resolved  13. Morbid obesity Weight loss and dietary counseling given.  14. Right kneegoutpatient  with right knee swelling decreased range of motion painful ambulation with history of gout was on allopurinol 300 mg daily which has been stopped and was started on febuxostat 11/25/2018. Appreciate Dr. Jess Barters input. She reports her pain is way better than yesterday. Continue Uloric.    15. Abnormal  LFTsAST ALT 96 and 82 follow-up as needed  16. Normocytic anemia/anemia of chronic kidney disease Likely dilutional and postoperative drop.Hemoglobin stable at 8.2she received 1 unit of blood transfusion.   17. Leukocytosisresolved  18. gastroesophageal reflux disease Continue PPI.  19.  Constipation patient had diarrhea last week stool softeners were stopped now we have restarted again continue.  20. Hyperkalemia5.3continue Veltassa  21. urinary retention Foley catheter placed back in due to urinary retention. Continue Flomax.   Nutrition Problem: Increased nutrient needs Etiology: acute illness, wound healing    Signs/Symptoms: estimated needs     Interventions: Juven, Ensure Enlive (each supplement provides 350kcal and 20 grams of protein)  Estimated body mass index is 49.95 kg/m as calculated from the following:   Height as of this encounter: 5\' 3"  (1.6 m).   Weight as of this encounter: 127.9 kg.  Discharge Instructions   Allergies as of 11/29/2018      Reactions   Ace Inhibitors    Actos [pioglitazone Hydrochloride]    Dust Mite Extract    Dye Fdc Red [erythrosine Red No. 3]    Fish Allergy       Medication List    STOP taking these medications   allopurinol 100 MG tablet Commonly known as:  Zyloprim   allopurinol 300 MG tablet Commonly known as:  ZYLOPRIM   cetirizine 10 MG tablet Commonly known as:  ZYRTEC   cloNIDine 0.1 MG tablet Commonly known as:  CATAPRES   colchicine 0.6 MG tablet   diclofenac sodium 1 % Gel Commonly known as:  Voltaren   glimepiride 4 MG tablet Commonly known as:  AMARYL   HYDROcodone-acetaminophen 5-325 MG tablet Commonly known as:  Norco   insulin aspart 100 UNIT/ML FlexPen Commonly known as:  NOVOLOG Replaced by:  insulin aspart 100 UNIT/ML injection   Insulin Glargine 100 UNIT/ML Solostar Pen Commonly known as:  Lantus SoloStar Replaced by:  insulin glargine 100 UNIT/ML injection    mometasone 0.1 % ointment Commonly known as:  ELOCON   tiZANidine 2 MG tablet Commonly known as:  ZANAFLEX     TAKE these medications   acetaminophen 325 MG tablet Commonly known as:  TYLENOL Take 2 tablets (650 mg total) by mouth every 6 (six) hours as needed for mild pain (or Fever >/= 101).   aspirin 81 MG tablet Take 81 mg by mouth daily.   B-D ULTRAFINE III SHORT PEN 31G X 8 MM Misc Generic drug:  Insulin Pen Needle USE WITH LANTUS.   calcitRIOL 0.25 MCG capsule Commonly known as:  ROCALTROL Take 0.25 mcg by mouth daily.   collagenase ointment Commonly known as:  SANTYL Apply topically daily.   diltiazem 300 MG 24 hr capsule Commonly known as:  CARDIZEM CD Take 1 capsule (300 mg total) by mouth daily.   febuxostat 40 MG tablet Commonly known as:  ULORIC Take 1 tablet (40 mg total) by mouth daily.   furosemide 40 MG tablet Commonly known as:  LASIX TAKE 2 TABLETS IN THE MORNING AND 1 TABLET IN THE EVENING.   insulin aspart 100 UNIT/ML injection Commonly known as:  novoLOG Inject 8 Units into the skin 3 (three) times daily with meals. Replaces:  insulin aspart 100 UNIT/ML FlexPen   insulin glargine 100 UNIT/ML injection Commonly known as:  LANTUS Inject 0.32 mLs (32 Units total) into the skin daily. Replaces:  Insulin Glargine 100 UNIT/ML Solostar Pen   levothyroxine 50 MCG tablet Commonly known as:  SYNTHROID Take 1 tablet (50 mcg total) by mouth daily.   metoprolol succinate 25 MG 24 hr tablet Commonly known as:  TOPROL-XL Take 1 tablet by mouth once daily   ondansetron 4 MG tablet Commonly known as:  ZOFRAN Take 1 tablet (4 mg total) by mouth every 6 (six) hours as needed for nausea.   oxyCODONE 5 MG immediate release tablet Commonly known as:  Oxy IR/ROXICODONE Take 1 tablet (5 mg total) by mouth every 6 (six) hours as needed for moderate pain.   pantoprazole 40 MG tablet Commonly known as:  PROTONIX Take 1 tablet (40 mg total) by mouth  daily at 6 (six) AM. Start taking on:  Nov 30, 2018   polyethylene glycol 17 g packet Commonly known as:  MIRALAX / GLYCOLAX Take 17 g by mouth daily.   pravastatin 80 MG tablet Commonly known as:  PRAVACHOL Take 1 tablet by mouth at bedtime   senna 8.6 MG Tabs tablet Commonly known as:  SENOKOT Take 2 tablets (17.2 mg total) by mouth at bedtime.   sodium bicarbonate 650 MG tablet Take 1 tablet (650 mg total) by mouth 4 (four) times daily.   sucralfate 1 g tablet Commonly known as:  Carafate Take 1 tablet (1 g total) by mouth 4 (four) times daily -  with meals and at bedtime.   tamsulosin 0.4 MG Caps capsule Commonly known as:  FLOMAX Take 1 capsule (0.4 mg total) by mouth daily after breakfast.   Veltassa 8.4 g packet Generic drug:  patiromer Take 8.4 g by mouth daily.       Contact information for follow-up providers    Susy Frizzle, MD Follow up.   Specialty:  Family Medicine Contact information: [address] [place] [address] [phone]            Contact information for after-discharge care    Destination    [facility] .   Service:  Skilled Nursing Contact information: [address] [place] [phone]                 Allergies  Allergen Reactions  . Ace Inhibitors   . Actos [Pioglitazone Hydrochloride]   . Dust Mite Extract   . Dye Fdc Red [Erythrosine Red No. 3]   . Fish Allergy     Consultations:  General surgery   Procedures/Studies: Dg Knee 1-2 Views Left  Result Date: 11/24/2018 CLINICAL DATA:  Diffuse left knee pain EXAM: LEFT KNEE - 1-2 VIEW COMPARISON:  None. FINDINGS: There is no acute displaced fracture or dislocation. There are moderate to severe degenerative changes, greatest within the medial and patellofemoral compartments. A small to moderate size suprapatellar joint effusion is noted. Nonspecific soft tissue swelling is noted about the lower  extremity. IMPRESSION: 1. No acute displaced fracture or dislocation. 2. Advanced osteoarthritis of the left knee. 3. Small to moderate size suprapatellar joint effusion. 4. Nonspecific soft tissue swelling, greatest about the lateral aspect of the knee. Electronically Signed   By: Constance Holster M.D.   On: 11/24/2018 16:09   Dg Knee 1-2 Views Right  Result Date: 11/24/2018 CLINICAL DATA:  Initial evaluation for acute right knee pain. EXAM:  RIGHT KNEE - 1-2 VIEW COMPARISON:  None. FINDINGS: No acute fracture or dislocation. Moderate joint effusion likely present within the suprapatellar recess, somewhat difficult to evaluate given patient positioning. Moderate to advanced tricompartmental degenerative osteoarthrosis, most notable at the medial femorotibial and patellofemoral articulations. Osseous mineralization normal. No soft tissue abnormality. IMPRESSION: 1. No acute osseous abnormality about the right knee. 2. Moderate joint effusion. 3. Moderate to advanced tricompartmental degenerative osteoarthrosis. Electronically Signed   By: Jeannine Boga M.D.   On: 11/24/2018 21:32   Ct Head Wo Contrast  Result Date: 11/12/2018 CLINICAL DATA:  Neurologic deficit.  Altered mental status. EXAM: CT HEAD WITHOUT CONTRAST TECHNIQUE: Contiguous axial images were obtained from the base of the skull through the vertex without intravenous contrast. COMPARISON:  PET-CT 06/12/2015 FINDINGS: Brain: No acute intracranial hemorrhage. No focal mass lesion. No CT evidence of acute infarction. No midline shift or mass effect. No hydrocephalus. Basilar cisterns are patent. Mild subcortical white matter hypodensities. Mild atrophy and ventricular dilatation. Findings not changed from prior. Vascular: No hyperdense vessel or unexpected calcification. Skull: Normal. Negative for fracture or focal lesion. Sinuses/Orbits: Paranasal sinuses and mastoid air cells are clear. Orbits are clear. Other: None. IMPRESSION: 1. No  acute intracranial findings. 2. Mild atrophy and white matter microvascular disease. Electronically Signed   By: Suzy Bouchard M.D.   On: 11/12/2018 11:19   Ct Pelvis Wo Contrast  Result Date: 11/08/2018 CLINICAL DATA:  Painful red area to left buttock. EXAM: CT PELVIS WITHOUT CONTRAST TECHNIQUE: Multidetector CT imaging of the pelvis was performed following the standard protocol without intravenous contrast. COMPARISON:  10/03/2014 FINDINGS: Urinary Tract:  Bladder is moderately distended. Bowel: No small bowel or colonic dilatation within the visualized pelvis. Vascular/Lymphatic: Atherosclerotic calcification noted distal aorta and common iliac arteries. Reproductive: The uterus is surgically absent. There is no adnexal mass. Other:  No intraperitoneal free fluid. Musculoskeletal: No worrisome lytic or sclerotic osseous abnormality. 7.1 x 9.6 x 11.1 cm area of ill-defined, infiltrating edema/inflammation is identified in the medial left buttock region, adjacent to the intergluteal fold. There is gas in the fat, scattered through the area of edema. No focal or rim enhancing fluid collection evident. IMPRESSION: Edema and gas identified in the subcutaneous fat of the medial left gluteal region without associated abscess. Imaging features compatible with soft tissue infection/cellulitis. Electronically Signed   By: Misty Stanley M.D.   On: 11/08/2018 17:21   Nm Hepatobiliary Liver Func  Result Date: 11/11/2018 CLINICAL DATA:  Right upper quadrant pain with fever and elevated white count. Abnormal gallbladder ultrasound EXAM: NUCLEAR MEDICINE HEPATOBILIARY IMAGING TECHNIQUE: Sequential images of the abdomen were obtained out to 60 minutes following intravenous administration of radiopharmaceutical. RADIOPHARMACEUTICALS:  5.0 mCi Tc-28m  Choletec IV COMPARISON:  11/11/2018 FINDINGS: Prompt uptake and biliary excretion of activity by the liver is seen. Gallbladder activity is visualized, consistent with  patency of cystic duct. Biliary activity passes into small bowel, consistent with patent common bile duct. IMPRESSION: Normal hepatobiliary scan Electronically Signed   By: Jerilynn Mages.  Shick M.D.   On: 11/11/2018 14:05   Dg Chest Port 1 View  Result Date: 11/15/2018 CLINICAL DATA:  78 year old female with shortness of breath. Sepsis. Negative for COVID-19 on 11/08/2018. EXAM: PORTABLE CHEST 1 VIEW COMPARISON:  11/14/2018 and earlier. FINDINGS: Portable AP semi upright view at 0438 hours. Continued somewhat low lung volumes. Mediastinal contours remain normal. No pneumothorax, pulmonary edema or pleural effusion. Regressed patchy lung base opacity, no confluent opacity today. Negative visible bowel  gas pattern. No acute osseous abnormality identified. IMPRESSION: Decreasing lung base atelectasis and no new cardiopulmonary abnormality. Electronically Signed   By: Genevie Ann M.D.   On: 11/15/2018 06:16   Dg Chest Port 1 View  Result Date: 11/14/2018 CLINICAL DATA:  78 year old female with shortness of breath. Negative for COVID-19 on 11/08/2018. EXAM: PORTABLE CHEST 1 VIEW COMPARISON:  11/13/2018 and earlier. FINDINGS: Portable AP semi upright view at 0439 hours. Mildly improved lung volumes. Stable cardiac size and mediastinal contours. Visualized tracheal air column is within normal limits. Continued mild streaky right greater than left lung base opacity. Today this more resembles atelectasis. Elsewhere the lungs are clear allowing for portable technique. Negative visible bowel gas pattern. No acute osseous abnormality identified. IMPRESSION: Mildly improved lung volumes. Residual lung base opacity which more resembles atelectasis today. No new cardiopulmonary abnormality. Electronically Signed   By: Genevie Ann M.D.   On: 11/14/2018 05:18   Dg Chest Port 1 View  Result Date: 11/13/2018 CLINICAL DATA:  78 year old female with shortness of breath. Negative for COVID-19 on 11/08/2018. EXAM: PORTABLE CHEST 1 VIEW  COMPARISON:  11/12/2018 and earlier. FINDINGS: Portable AP semi upright view at 0354 hours. Continued low lung volumes with platelike and streaky opacity in the right mid and lower lung. No pneumothorax, pulmonary edema, pleural effusion or other confluent opacity. Mediastinal contours are stable and within normal limits. Visualized tracheal air column is within normal limits. Visible bowel gas pattern is within normal limits. No acute osseous abnormality identified. IMPRESSION: Continued low lung volumes and streaky right mid and lower lung opacity since yesterday, indeterminate for atelectasis versus infection. Electronically Signed   By: Genevie Ann M.D.   On: 11/13/2018 05:52   Dg Chest Port 1 View  Result Date: 11/12/2018 CLINICAL DATA:  Shortness of breath. EXAM: PORTABLE CHEST 1 VIEW COMPARISON:  Radiograph of Nov 08, 2018. FINDINGS: The heart size and mediastinal contours are within normal limits. No pneumothorax or pleural effusion is noted. Left lung is clear. Minimal right basilar subsegmental atelectasis is noted. The visualized skeletal structures are unremarkable. IMPRESSION: Minimal right basilar subsegmental atelectasis. Electronically Signed   By: Marijo Conception M.D.   On: 11/12/2018 11:41   Dg Chest Port 1 View  Result Date: 11/08/2018 CLINICAL DATA:  78 year old female with buttock pain, hyperglycemia and fever EXAM: PORTABLE CHEST 1 VIEW COMPARISON:  Prior chest x-ray 06/12/2015 FINDINGS: The patient is rotated toward the right in the view is slightly lordotic as well. Stable cardiac and mediastinal contours. Calcified hilar nodes again noted bilaterally. The lungs are clear. Perhaps minimal atelectasis in the right lung base. No acute osseous abnormality. IMPRESSION: Stable chest x-ray without evidence of acute cardiopulmonary process. Electronically Signed   By: Jacqulynn Cadet M.D.   On: 11/08/2018 16:08   Dg Foot 2 Views Left  Result Date: 11/12/2018 CLINICAL DATA:  Acute left foot  pain. EXAM: LEFT FOOT - 2 VIEW COMPARISON:  None. FINDINGS: There is no evidence of fracture or dislocation. There is no evidence of arthropathy or other focal bone abnormality. Soft tissues are unremarkable. IMPRESSION: Negative. Electronically Signed   By: Marijo Conception M.D.   On: 11/12/2018 11:39   US Abdomen Limited Ruq  Result Date: 11/11/2018 CLINICAL DATA:  Abnormal LFTs EXAM: ULTRASOUND ABDOMEN LIMITED RIGHT UPPER QUADRANT COMPARISON:  None. FINDINGS: Gallbladder: No gallbladder wall thickening. Sludge within the gallbladder. Trace pericholecystic fluid. Positive sonographic Murphy's sign. Common bile duct: Diameter: Normal at 5 mm Liver: No focal  lesion identified. Within normal limits in parenchymal echogenicity. Portal vein is patent on color Doppler imaging with normal direction of blood flow towards the liver. IMPRESSION: 1. Gallbladder sludge and positive sonographic Murphy's sign. Recommend clinical correlation for ACUTE CHOLECYSTITIS. 2. No biliary duct dilatation. Electronically Signed   By: Suzy Bouchard M.D.   On: 11/11/2018 06:13   (Echo, Carotid, EGD, Colonoscopy, ERCP)    Subjective:resting in bed eager to be going to rehab   Discharge Exam: Vitals:   11/28/18 2110 11/29/18 0529  BP: 121/73 139/78  Pulse: 64 66  Resp: 20 20  Temp: 98.1 F (36.7 C) 98.2 F (36.8 C)  SpO2: 99% 98%   Vitals:   11/28/18 1025 11/28/18 1520 11/28/18 2110 11/29/18 0529  BP: 133/60 123/68 121/73 139/78  Pulse: 72 67 64 66  Resp:  17 20 20   Temp:  98.3 F (36.8 C) 98.1 F (36.7 C) 98.2 F (36.8 C)  TempSrc:  Oral Oral Oral  SpO2:  100% 99% 98%  Weight:      Height:        General: Pt is alert, awake, not in acute distress Cardiovascular: RRR, S1/S2 +, no rubs, no gallops Respiratory: CTA bilaterally, no wheezing, no rhonchi Abdominal: Soft, NT, ND, bowel sounds + Extremities:trace edema   The results of significant diagnostics from this hospitalization (including imaging,  microbiology, ancillary and laboratory) are listed below for reference.     Microbiology: No results found for this or any previous visit (from the past 240 hour(s)).   Labs: BNP (last 3 results) No results for input(s): BNP in the last 8760 hours. Basic Metabolic Panel: Recent Labs  Lab 11/23/18 0702 11/25/18 0558 11/27/18 0541  NA 133* 134* 133*  K 5.5* 5.3* 5.3*  CL 104 100 99  CO2 22 24 24   GLUCOSE 195* 84 319*  BUN 75* 70* 79*  CREATININE 2.72* 2.75* 2.73*  CALCIUM 8.7* 9.1 9.0  MG 2.3 2.2  --    Liver Function Tests: Recent Labs  Lab 11/23/18 0702 11/25/18 0558 11/27/18 0541  AST 67* 63* 96*  ALT 65* 60* 82*  ALKPHOS 71 79 71  BILITOT 0.8 <0.1* <0.1*  PROT 5.9* 6.4* 5.8*  ALBUMIN 1.9* 2.0* 1.8*   No results for input(s): LIPASE, AMYLASE in the last 168 hours. No results for input(s): AMMONIA in the last 168 hours. CBC: Recent Labs  Lab 11/23/18 0702 11/25/18 0558 11/27/18 0541  WBC 10.3 10.9* 9.3  HGB 7.9* 8.7* 8.2*  HCT 25.4* 26.8* 25.4*  MCV 86.7 87.0 87.3  PLT [phone]* [phone]* [phone]   Cardiac Enzymes: No results for input(s): CKTOTAL, CKMB, CKMBINDEX, TROPONINI in the last 168 hours. BNP: Invalid input(s): POCBNP CBG: Recent Labs  Lab 11/28/18 0741 11/28/18 1216 11/28/18 1723 11/28/18 2105 11/29/18 0736  GLUCAP 230* 213* 241* 216* 160*   D-Dimer No results for input(s): DDIMER in the last 72 hours. Hgb A1c No results for input(s): HGBA1C in the last 72 hours. Lipid Profile No results for input(s): CHOL, HDL, LDLCALC, TRIG, CHOLHDL, LDLDIRECT in the last 72 hours. Thyroid function studies No results for input(s): TSH, T4TOTAL, T3FREE, THYROIDAB in the last 72 hours.  Invalid input(s): FREET3 Anemia work up No results for input(s): VITAMINB12, FOLATE, FERRITIN, TIBC, IRON, RETICCTPCT in the last 72 hours. Urinalysis    Component Value Date/Time   COLORURINE YELLOW 11/12/2018 0941   APPEARANCEUR CLOUDY (A) 11/12/2018 0941   LABSPEC 1.011  11/12/2018 0941   PHURINE 5.0 11/12/2018 0941  GLUCOSEU NEGATIVE 11/12/2018 0941   HGBUR SMALL (A) 11/12/2018 0941   BILIRUBINUR NEGATIVE 11/12/2018 0941   KETONESUR 5 (A) 11/12/2018 0941   PROTEINUR NEGATIVE 11/12/2018 0941   UROBILINOGEN 0.2 11/21/2009 2241   NITRITE NEGATIVE 11/12/2018 0941   LEUKOCYTESUR MODERATE (A) 11/12/2018 0941   Sepsis Labs Invalid input(s): PROCALCITONIN,  WBC,  LACTICIDVEN Microbiology No results found for this or any previous visit (from the past 240 hour(s)).   Time coordinating discharge:  34 minutes  SIGNED:   Georgette Shell, MD  [facility] 11/29/2018, 9:11 AM Pager   If 7PM-7AM, please contact night-coverage [email or web address] Password [email or web address]

## 2018-11-29 NOTE — Discharge Instructions (Signed)
Patient needs dressing changes to her decub twice a day Santyl has to be applied to the area with eschar

## 2018-11-29 NOTE — Progress Notes (Addendum)
HYDROTHERAPY TREATMENT     11/29/18 1100  Subjective Assessment  Subjective "it hurts after y'all are done"  Patient and Family Stated Goals less pain  Date of Onset  (abscess POA)  Prior Treatments s/p I&D 5/7  Evaluation and Treatment  Evaluation and Treatment Procedures Explained to Patient/Family Yes  Evaluation and Treatment Procedures agreed to  Wound / Incision (Open or Dehisced) 11/13/18 Incision - Open Buttocks Left L buttocks wound s/p I&D by Surgery 11/09/18. **HYDRO/PT**  Date First Assessed/Time First Assessed: 11/13/18 1500   Wound Type: Incision - Open  Location: Buttocks  Location Orientation: Left  Wound Description (Comments): L buttocks wound s/p I&D by Surgery 11/09/18. **HYDRO/PT**  Dressing Type Foam;Moist to dry;Gauze; Petrolatum Kerlix packing; Santyl; Foam didn't stick well where petrolatum used-had to reinforce with tape  Dressing Changed Changed  Dressing Status Old drainage  Dressing Change Frequency Twice a day  Site / Wound Assessment Pink;Yellow;Black;Bleeding;Painful  % Wound base Red or Granulating 75%  % Wound base Yellow/Fibrinous Exudate 15%  % Wound base Black/Eschar 10%  Peri-wound Assessment Excoriated (continuing to improve-looks much better-may be able to d/c Petrolatum)  Margins Unattached edges (unapproximated)  Drainage Amount Moderate  Drainage Description Serosanguineous  Treatment Debridement (Selective);Hydrotherapy (Pulse lavage);Packing (Saline gauze) (Enzymatic debridement (Santyl))  Hydrotherapy  Pulsed lavage therapy - wound location L medial buttocks  Pulsed Lavage with Suction (psi) 8 psi  Pulsed Lavage with Suction - Normal Saline Used 1000 mL  Pulsed Lavage Tip Tip with splash shield  Selective Debridement  Selective Debridement - Location L buttock  Selective Debridement - Tools Used Forceps;Scissors  Selective Debridement - Tissue Removed brown/black tissue and yellow slough  Wound Therapy - Assess/Plan/Recommendations   Wound Therapy - Clinical Statement 78 yo female admitted with L gluteal/buttock abscess. S/P I&D 11/09/18.   Wound Therapy - Functional Problem List limited mobility  Factors Delaying/Impairing Wound Healing Diabetes Mellitus;Multiple medical problems;Immobility;Incontinence  Hydrotherapy Plan Debridement;Dressing change;Patient/family education;Pulsatile lavage with suction  Wound Therapy - Frequency 6X / week  Wound Therapy - Current Recommendations Case manager/social work  Wound Therapy - Follow Up Recommendations Skilled nursing facility  Wound Plan Pt set to d/c to SNF today. Plan is for continued wound care/dressing changes and use of Santyl.   Wound Therapy Goals - Improve the function of patient's integumentary system by progressing the wound(s) through the phases of wound healing by:  Decrease Necrotic Tissue to 20%  Decrease Necrotic Tissue - Progress Progressing toward goal  Increase Granulation Tissue to 80%  Increase Granulation Tissue - Progress Progressing toward goal  Improve Drainage Characteristics Min  Improve Drainage Characteristics - Progress Progressing toward goal  Goals/treatment plan/discharge plan were made with and agreed upon by patient/family Yes  Time For Goal Achievement 2 weeks    Weston Anna, [place] Pager: [phone] Office: [phone]

## 2018-11-30 DIAGNOSIS — A419 Sepsis, unspecified organism: Secondary | ICD-10-CM | POA: Diagnosis not present

## 2018-11-30 DIAGNOSIS — N179 Acute kidney failure, unspecified: Secondary | ICD-10-CM | POA: Diagnosis not present

## 2018-11-30 DIAGNOSIS — S31829A Unspecified open wound of left buttock, initial encounter: Secondary | ICD-10-CM | POA: Diagnosis not present

## 2018-11-30 DIAGNOSIS — R6520 Severe sepsis without septic shock: Secondary | ICD-10-CM | POA: Diagnosis not present

## 2018-12-01 DIAGNOSIS — D649 Anemia, unspecified: Secondary | ICD-10-CM | POA: Diagnosis not present

## 2018-12-01 DIAGNOSIS — I1310 Hypertensive heart and chronic kidney disease without heart failure, with stage 1 through stage 4 chronic kidney disease, or unspecified chronic kidney disease: Secondary | ICD-10-CM | POA: Diagnosis not present

## 2018-12-01 DIAGNOSIS — N184 Chronic kidney disease, stage 4 (severe): Secondary | ICD-10-CM | POA: Diagnosis not present

## 2018-12-01 DIAGNOSIS — S31829D Unspecified open wound of left buttock, subsequent encounter: Secondary | ICD-10-CM | POA: Diagnosis not present

## 2018-12-05 DIAGNOSIS — D649 Anemia, unspecified: Secondary | ICD-10-CM | POA: Diagnosis not present

## 2018-12-05 DIAGNOSIS — E1122 Type 2 diabetes mellitus with diabetic chronic kidney disease: Secondary | ICD-10-CM | POA: Diagnosis not present

## 2018-12-05 DIAGNOSIS — E8809 Other disorders of plasma-protein metabolism, not elsewhere classified: Secondary | ICD-10-CM | POA: Diagnosis not present

## 2018-12-05 DIAGNOSIS — N184 Chronic kidney disease, stage 4 (severe): Secondary | ICD-10-CM | POA: Diagnosis not present

## 2018-12-06 ENCOUNTER — Encounter (HOSPITAL_BASED_OUTPATIENT_CLINIC_OR_DEPARTMENT_OTHER): Payer: PPO | Attending: Physician Assistant

## 2018-12-06 DIAGNOSIS — Z6841 Body Mass Index (BMI) 40.0 and over, adult: Secondary | ICD-10-CM | POA: Insufficient documentation

## 2018-12-06 DIAGNOSIS — T8189XA Other complications of procedures, not elsewhere classified, initial encounter: Secondary | ICD-10-CM | POA: Diagnosis not present

## 2018-12-06 DIAGNOSIS — I482 Chronic atrial fibrillation, unspecified: Secondary | ICD-10-CM | POA: Diagnosis not present

## 2018-12-06 DIAGNOSIS — E43 Unspecified severe protein-calorie malnutrition: Secondary | ICD-10-CM | POA: Diagnosis not present

## 2018-12-06 DIAGNOSIS — I129 Hypertensive chronic kidney disease with stage 1 through stage 4 chronic kidney disease, or unspecified chronic kidney disease: Secondary | ICD-10-CM | POA: Insufficient documentation

## 2018-12-06 DIAGNOSIS — N184 Chronic kidney disease, stage 4 (severe): Secondary | ICD-10-CM | POA: Diagnosis not present

## 2018-12-06 DIAGNOSIS — L98415 Non-pressure chronic ulcer of buttock with muscle involvement without evidence of necrosis: Secondary | ICD-10-CM | POA: Insufficient documentation

## 2018-12-06 DIAGNOSIS — E11622 Type 2 diabetes mellitus with other skin ulcer: Secondary | ICD-10-CM | POA: Diagnosis not present

## 2018-12-06 DIAGNOSIS — Z87891 Personal history of nicotine dependence: Secondary | ICD-10-CM | POA: Diagnosis not present

## 2018-12-06 DIAGNOSIS — I2510 Atherosclerotic heart disease of native coronary artery without angina pectoris: Secondary | ICD-10-CM | POA: Insufficient documentation

## 2018-12-06 DIAGNOSIS — E1122 Type 2 diabetes mellitus with diabetic chronic kidney disease: Secondary | ICD-10-CM | POA: Insufficient documentation

## 2018-12-08 DIAGNOSIS — N184 Chronic kidney disease, stage 4 (severe): Secondary | ICD-10-CM | POA: Diagnosis not present

## 2018-12-08 DIAGNOSIS — E1122 Type 2 diabetes mellitus with diabetic chronic kidney disease: Secondary | ICD-10-CM | POA: Diagnosis not present

## 2018-12-13 DIAGNOSIS — R7989 Other specified abnormal findings of blood chemistry: Secondary | ICD-10-CM | POA: Diagnosis not present

## 2018-12-13 DIAGNOSIS — D649 Anemia, unspecified: Secondary | ICD-10-CM | POA: Diagnosis not present

## 2018-12-13 DIAGNOSIS — E8809 Other disorders of plasma-protein metabolism, not elsewhere classified: Secondary | ICD-10-CM | POA: Diagnosis not present

## 2018-12-15 DIAGNOSIS — E1122 Type 2 diabetes mellitus with diabetic chronic kidney disease: Secondary | ICD-10-CM | POA: Diagnosis not present

## 2018-12-15 DIAGNOSIS — S31829D Unspecified open wound of left buttock, subsequent encounter: Secondary | ICD-10-CM | POA: Diagnosis not present

## 2018-12-15 DIAGNOSIS — N184 Chronic kidney disease, stage 4 (severe): Secondary | ICD-10-CM | POA: Diagnosis not present

## 2018-12-20 DIAGNOSIS — T8189XA Other complications of procedures, not elsewhere classified, initial encounter: Secondary | ICD-10-CM | POA: Diagnosis not present

## 2018-12-20 DIAGNOSIS — E11622 Type 2 diabetes mellitus with other skin ulcer: Secondary | ICD-10-CM | POA: Diagnosis not present

## 2018-12-20 DIAGNOSIS — L98415 Non-pressure chronic ulcer of buttock with muscle involvement without evidence of necrosis: Secondary | ICD-10-CM | POA: Diagnosis not present

## 2018-12-26 DIAGNOSIS — R531 Weakness: Secondary | ICD-10-CM | POA: Diagnosis not present

## 2018-12-26 DIAGNOSIS — Z1331 Encounter for screening for depression: Secondary | ICD-10-CM | POA: Diagnosis not present

## 2018-12-28 DIAGNOSIS — D126 Benign neoplasm of colon, unspecified: Secondary | ICD-10-CM | POA: Diagnosis not present

## 2018-12-29 ENCOUNTER — Telehealth: Payer: Self-pay | Admitting: Family Medicine

## 2018-12-29 ENCOUNTER — Other Ambulatory Visit: Payer: Self-pay | Admitting: Family Medicine

## 2018-12-29 DIAGNOSIS — S31829D Unspecified open wound of left buttock, subsequent encounter: Secondary | ICD-10-CM | POA: Diagnosis not present

## 2018-12-29 DIAGNOSIS — I1310 Hypertensive heart and chronic kidney disease without heart failure, with stage 1 through stage 4 chronic kidney disease, or unspecified chronic kidney disease: Secondary | ICD-10-CM | POA: Diagnosis not present

## 2018-12-29 DIAGNOSIS — N184 Chronic kidney disease, stage 4 (severe): Secondary | ICD-10-CM | POA: Diagnosis not present

## 2018-12-29 DIAGNOSIS — E1122 Type 2 diabetes mellitus with diabetic chronic kidney disease: Secondary | ICD-10-CM | POA: Diagnosis not present

## 2018-12-29 MED ORDER — PREDNISONE 20 MG PO TABS: ORAL_TABLET | ORAL | 0 refills | Status: DC

## 2018-12-29 NOTE — Telephone Encounter (Signed)
Pt is having a gout flare up and wants to know if we can call in med to the pharmacy. [place].

## 2018-12-29 NOTE — Telephone Encounter (Signed)
Pt aware.

## 2018-12-29 NOTE — Telephone Encounter (Signed)
I sent prednisone to her pharm.  She is due for ov.

## 2018-12-30 DIAGNOSIS — M10061 Idiopathic gout, right knee: Secondary | ICD-10-CM | POA: Diagnosis not present

## 2018-12-30 DIAGNOSIS — Z794 Long term (current) use of insulin: Secondary | ICD-10-CM | POA: Diagnosis not present

## 2018-12-30 DIAGNOSIS — L0231 Cutaneous abscess of buttock: Secondary | ICD-10-CM | POA: Diagnosis not present

## 2018-12-30 DIAGNOSIS — K5900 Constipation, unspecified: Secondary | ICD-10-CM | POA: Diagnosis not present

## 2018-12-30 DIAGNOSIS — I2510 Atherosclerotic heart disease of native coronary artery without angina pectoris: Secondary | ICD-10-CM | POA: Diagnosis not present

## 2018-12-30 DIAGNOSIS — E8809 Other disorders of plasma-protein metabolism, not elsewhere classified: Secondary | ICD-10-CM | POA: Diagnosis not present

## 2018-12-30 DIAGNOSIS — M5030 Other cervical disc degeneration, unspecified cervical region: Secondary | ICD-10-CM | POA: Diagnosis not present

## 2018-12-30 DIAGNOSIS — E669 Obesity, unspecified: Secondary | ICD-10-CM | POA: Diagnosis not present

## 2018-12-30 DIAGNOSIS — N184 Chronic kidney disease, stage 4 (severe): Secondary | ICD-10-CM | POA: Diagnosis not present

## 2018-12-30 DIAGNOSIS — D631 Anemia in chronic kidney disease: Secondary | ICD-10-CM | POA: Diagnosis not present

## 2018-12-30 DIAGNOSIS — E039 Hypothyroidism, unspecified: Secondary | ICD-10-CM | POA: Diagnosis not present

## 2018-12-30 DIAGNOSIS — L98412 Non-pressure chronic ulcer of buttock with fat layer exposed: Secondary | ICD-10-CM | POA: Diagnosis not present

## 2018-12-30 DIAGNOSIS — E43 Unspecified severe protein-calorie malnutrition: Secondary | ICD-10-CM | POA: Diagnosis not present

## 2018-12-30 DIAGNOSIS — K219 Gastro-esophageal reflux disease without esophagitis: Secondary | ICD-10-CM | POA: Diagnosis not present

## 2018-12-30 DIAGNOSIS — E1122 Type 2 diabetes mellitus with diabetic chronic kidney disease: Secondary | ICD-10-CM | POA: Diagnosis not present

## 2018-12-30 DIAGNOSIS — E785 Hyperlipidemia, unspecified: Secondary | ICD-10-CM | POA: Diagnosis not present

## 2018-12-30 DIAGNOSIS — I482 Chronic atrial fibrillation, unspecified: Secondary | ICD-10-CM | POA: Diagnosis not present

## 2018-12-30 DIAGNOSIS — I129 Hypertensive chronic kidney disease with stage 1 through stage 4 chronic kidney disease, or unspecified chronic kidney disease: Secondary | ICD-10-CM | POA: Diagnosis not present

## 2019-01-03 ENCOUNTER — Encounter (HOSPITAL_BASED_OUTPATIENT_CLINIC_OR_DEPARTMENT_OTHER): Payer: PPO | Attending: Physician Assistant

## 2019-01-03 DIAGNOSIS — Z87891 Personal history of nicotine dependence: Secondary | ICD-10-CM | POA: Diagnosis not present

## 2019-01-03 DIAGNOSIS — I129 Hypertensive chronic kidney disease with stage 1 through stage 4 chronic kidney disease, or unspecified chronic kidney disease: Secondary | ICD-10-CM | POA: Insufficient documentation

## 2019-01-03 DIAGNOSIS — I482 Chronic atrial fibrillation, unspecified: Secondary | ICD-10-CM | POA: Insufficient documentation

## 2019-01-03 DIAGNOSIS — L98412 Non-pressure chronic ulcer of buttock with fat layer exposed: Secondary | ICD-10-CM | POA: Insufficient documentation

## 2019-01-03 DIAGNOSIS — N184 Chronic kidney disease, stage 4 (severe): Secondary | ICD-10-CM | POA: Insufficient documentation

## 2019-01-03 DIAGNOSIS — E1122 Type 2 diabetes mellitus with diabetic chronic kidney disease: Secondary | ICD-10-CM | POA: Diagnosis not present

## 2019-01-03 DIAGNOSIS — I2510 Atherosclerotic heart disease of native coronary artery without angina pectoris: Secondary | ICD-10-CM | POA: Insufficient documentation

## 2019-01-03 DIAGNOSIS — Z794 Long term (current) use of insulin: Secondary | ICD-10-CM | POA: Insufficient documentation

## 2019-01-03 DIAGNOSIS — E11622 Type 2 diabetes mellitus with other skin ulcer: Secondary | ICD-10-CM | POA: Diagnosis not present

## 2019-01-03 DIAGNOSIS — L98415 Non-pressure chronic ulcer of buttock with muscle involvement without evidence of necrosis: Secondary | ICD-10-CM | POA: Diagnosis not present

## 2019-01-04 DIAGNOSIS — T8189XA Other complications of procedures, not elsewhere classified, initial encounter: Secondary | ICD-10-CM | POA: Diagnosis not present

## 2019-01-04 DIAGNOSIS — S31829A Unspecified open wound of left buttock, initial encounter: Secondary | ICD-10-CM | POA: Diagnosis not present

## 2019-01-09 ENCOUNTER — Inpatient Hospital Stay: Payer: PPO | Admitting: Family Medicine

## 2019-01-17 DIAGNOSIS — T8189XA Other complications of procedures, not elsewhere classified, initial encounter: Secondary | ICD-10-CM | POA: Diagnosis not present

## 2019-01-17 DIAGNOSIS — E11622 Type 2 diabetes mellitus with other skin ulcer: Secondary | ICD-10-CM | POA: Diagnosis not present

## 2019-01-22 ENCOUNTER — Other Ambulatory Visit: Payer: Self-pay

## 2019-01-23 ENCOUNTER — Encounter: Payer: Self-pay | Admitting: Family Medicine

## 2019-01-23 ENCOUNTER — Ambulatory Visit (INDEPENDENT_AMBULATORY_CARE_PROVIDER_SITE_OTHER): Payer: PPO | Admitting: Family Medicine

## 2019-01-23 VITALS — BP 140/60 | HR 110 | Temp 98.9°F | Resp 18 | Ht 63.0 in | Wt 220.0 lb

## 2019-01-23 DIAGNOSIS — E039 Hypothyroidism, unspecified: Secondary | ICD-10-CM | POA: Diagnosis not present

## 2019-01-23 DIAGNOSIS — Z09 Encounter for follow-up examination after completed treatment for conditions other than malignant neoplasm: Secondary | ICD-10-CM

## 2019-01-23 DIAGNOSIS — N186 End stage renal disease: Secondary | ICD-10-CM

## 2019-01-23 DIAGNOSIS — E1122 Type 2 diabetes mellitus with diabetic chronic kidney disease: Secondary | ICD-10-CM | POA: Diagnosis not present

## 2019-01-23 DIAGNOSIS — N184 Chronic kidney disease, stage 4 (severe): Secondary | ICD-10-CM | POA: Diagnosis not present

## 2019-01-23 MED ORDER — METOPROLOL SUCCINATE ER 50 MG PO TB24: 50.0000 mg | ORAL_TABLET | Freq: Every day | ORAL | 3 refills | Status: DC

## 2019-01-23 MED ORDER — FUROSEMIDE 40 MG PO TABS: ORAL_TABLET | ORAL | 1 refills | Status: DC

## 2019-01-23 NOTE — Progress Notes (Signed)
Subjective:    Patient ID: Emily Hughes, female    DOB: 03-28-1941, 78 y.o.   MRN: [id number]  HPI Recently admitted to the hospital and I have copied portions of the discharge summary below for reference:  Admit date: 11/08/2018 Discharge date: 11/29/2018  Admitted From: home Disposition:  home  Recommendations for Outpatient Follow-up:  1. Follow up with PCP in 1-2 weeks 2. Please obtain BMP/CBC in one week 3.   Home Health none Equipment/Devices:none Discharge Condition:stable  CODE STATUS:dnr Diet recommendation:carb modified cardiac  Brief/Interim Summary:78 year old African-American female with a past medical history significant for but not limited to diabetes mellitus type 2, CKD stage IV, hypothyroidism, CAD, GERD, hypertension, hyperlipidemia, history of seizure disorder, ? Atrial fibrillation, long with comorbidities who presented to the emergency room with a 3-day history of pain and swelling in her buttocks along with a fever of 102. Family reportedly gave her some Tylenol that helped her improve her temperature down to 99 but she is found to have elevated blood pressures for last few days when in the 400s. She has not been taking her insulin for last 2 days and has not been checking her glucose regularly. On admission she stated that the left buttock pain lasted for last few days and was severe 10 out of 10 and reportedly was a boil that then progressed.  She was worked up and admitted for sepsis secondary to abscess of the left buttock as well as DKA. She was placed in the stepdown unit and was placed on glucose stabilizer and has improvement in her blood sugars. She was incidentally noted to be in atrial fibrillation/A Flutter which then converted to NSR a few days ago. Cardiology was consulted for preoperative clearance and patient was taken for incision and drainage of her abscess on her left buttock and she is POD2. DKA improved and she was transitioned to  Long Acting Insulin even though Her CO2 is not >20 given that she has chronic Metabolic Acidosis 2/2 to her Renal Disease and is on Sodium Bicarbonate. She tolerated her Diet without issues but complained of some Abdominal Pain.   Liver function tests were elevated and right upper quadrant ultrasound was done and showed possible acute cholecystitis. I discussed with general surgery Dr. Jens Som who recommended obtaining a HIDA scan however HIDA scan with EF is not able to be done at this time and if EF is needed to be done will come will be on Tuesday per nursing. So will obtain a HIDA scan without the EF currently. And this HIDA scan was normal hepatobiliary scan so Dr. Windle Guard was less inclined for acute cholecystitis and felt that her LFTs being elevated could be reactionary. We will continue to monitor closely and antibiotics have been de-escalated to just IV cefepime Flagyl for now and will continue to de-escalate accordingly  Discharge Diagnoses:  Principal Problem:   Sepsis ([place]) Active Problems:   Essential hypertension   CAD (coronary artery disease)   GERD (gastroesophageal reflux disease)   DKA (diabetic ketoacidoses) (HCC)   Chronic kidney disease (CKD), stage IV (severe) (HCC)   Type 2 diabetes mellitus with ketoacidosis without coma (HCC)   Abscess of left buttock   Atrial fibrillation with RVR (HCC)   Elevated troponin   Abnormal LFTs   Acute renal failure superimposed on stage 4 chronic kidney disease ([place])   Acute metabolic encephalopathy   Cellulitis and abscess of buttock   Acute urinary retention   Acute idiopathic gout of multiple  sites   Severe protein-calorie malnutrition ([place])   1sepsis secondary to left buttocks/gluteal abscess status post incision and drainageof abscessthat is post I&D 11/09/2018. Patient continues to get hydrotherapy daily with surgery following for possible debridement if needed. Blood cultures have been negative. COVID-19 negative  MRSA PCR negative urine culture negative. Patient received IV antibiotics for 10 days.Patient family would like her to go to [facility].Review of hydrotherapy notes shows that she has 70% granulation tissue with 15% black eschar and 15% yellow exudate.  Patient will be discharged to [facility] today.  Wound care team at [facility] will follow this patient's wound.  She needs dressing changes twice a day with Santyl applied to the affected areas in the right lower quadrant from 3-6 o'clock where there is more of eschar.  If she is unable to stand please do sitz bath if she is able to stand shower her with water and soap.  She must not lay on that wound she needs to be repositioned every 2 hours.  2. Diabetes mellitus type 2/DKA Patient had presented in DKA felt secondary to problem #1. Hemoglobin A1c 8.4.Sugarsnow back up to 200 range will increase Lantus and NovoLog..  3New onset/Transient atrial fibrillation/atrial flutter with variable block status post conversion to normal sinus rhythm with transient third-degree AV block.?This atrial fibrillation is not permanent paroxysmal or chronic it was just a transient A. fib. Felt likely in the setting of sepsis secondary to problem #1. Patient converted to normal sinus rhythm with a short episode of third-degree AV block overnight while asleep thought possibly secondary to AV nodal blocking drugs and sleep apnea. Patient initially on a Cardizem drip and subsequently transitioned to oral Cardizem which has subsequently been discontinued. Patient seen in consultation by cardiology who feels no need for anticoagulation at this time given short duration and need for surgical intervention. Continue current regimen of Toprol-XL 50 mg daily.  Patient was seen by cardiology.  4Hypertensionblood pressure better. Continue Lasix metoprolol and Cardizem.  I have stopped her clonidine and Norvasc due to soft blood pressure.   5Elevated  troponin Felt secondary to demand ischemia in the setting of sepsis, elevated lactic acid and acute on chronic kidney failure. EKG with question of flutter versus sinus tach with PACs. Repeat EKG showed a normal sinus rhythm and evidence of LVH with no signs of ischemia noted. Patient seen by cardiology who felt no further ischemic work-up needed at this time. Continue aspirin 81 mg daily, Toprol-XL, statin. Follow  7Acute on chronic kidney disease stage IV/chronic metabolic acidosis Felt secondary to prerenal azotemia in the setting of sepsis. Patient on admission noted to have a creatinine of 4.76. Patient placed on IV fluids which have subsequently been discontinued. Renal function improved and currently close to baseline. Creatinine at 2.76Continue calcitriol. Acidosis improved with increased bicarb tablets of 1300 mg p.o. 3 times daily. Patient with some lower extremity edema. Placed on Lasix 40 mg orally twice daily. Patient was on Lasix 80 mg in the morning and 40 mg at bedtime as her home medication dose. Outpatient follow-up with nephrology.   8. Hypothyroidism TSH of 1.815 on 11/09/2018. Synthroid.   9. Coronary artery disease Currently stable. Patient denies any acute chest pain. Patient was seen by cardiology during the hospitalization secondary to new onset atrial flutter with variable block. Continue current cardiac medications of aspirin, Toprol-XL, statin, Norvasc. Outpatient follow-up with cardiology.  10. Hyponatremiastable at 133 11. Hypermagnesemia magnesium 2.2 follow-up at the nursing home. 12.  Acute encephalopathy/somnolence and lethargy resolved  13. Morbid obesity Weight loss and dietary counseling given.  14. Right kneegoutpatient with right knee swelling decreased range of motion painful ambulation with history of gout was on allopurinol 300 mg daily which has been stopped and was started on febuxostat 11/25/2018. Appreciate Dr. Jess Barters  input. She reports her pain is way better than yesterday. Continue Uloric.    15. Abnormal LFTsAST ALT 96 and 82 follow-up as needed  16. Normocytic anemia/anemia of chronic kidney disease Likely dilutional and postoperative drop.Hemoglobin stable at 8.2she received 1 unit of blood transfusion.   17. Leukocytosisresolved  18. gastroesophageal reflux disease Continue PPI.  19.  Constipation patient had diarrhea last week stool softeners were stopped now we have restarted again continue.  20. Hyperkalemia5.3continue Veltassa  21. urinary retention Foley catheter placed back in due to urinary retention. Continue Flomax.  01/23/19 Here today for follow up.  Now living with her niece Stanton [facility] who is managing her meds with her.  Reports FBS of 100-116 and 2 hr pps of 100-180's.  Denies hypoglycemia or polyuria, polydypsia or BV.  Denies RUQ abd pain, nausea, vomiting or fever.  Denies chest pain or pleurisy.  Denies dysuria or frequency or urgency.  Currently on lantus 30 units daily and novolog 8 units tid with meals.  Now compliant with insulin.   Past Medical History:  Diagnosis Date  . Anemia   . CAD (coronary artery disease)   . Colon polyps   . CRI (chronic renal insufficiency)   . DDD (degenerative disc disease)   . Diabetes mellitus    INSULIN DEPENDENT  . GERD (gastroesophageal reflux disease)   . High cholesterol   . Hypertension   . Pancreatitis   . Proteinuria    Past Surgical History:  Procedure Laterality Date  . ABDOMINAL HYSTERECTOMY    . ANGIOPLASTY    . ANKLE SURGERY Right   . BACK SURGERY    . INCISION AND DRAINAGE ABSCESS N/A 11/09/2018   Procedure: INCISION AND DRAINAGE ABSCESS;  Surgeon: Coralie Keens, MD;  Location: [facility]S;  Service: General;  Laterality: N/A;  . TONSILLECTOMY     Current Outpatient Medications on File Prior to Visit  Medication Sig Dispense Refill  . acetaminophen (TYLENOL) 325 MG tablet Take 2 tablets (650 mg  total) by mouth every 6 (six) hours as needed for mild pain (or Fever >/= 101).    Marland Kitchen aspirin 81 MG tablet Take 81 mg by mouth daily.      . B-D ULTRAFINE III SHORT PEN 31G X 8 MM MISC USE WITH LANTUS. 100 each 0  . calcitRIOL (ROCALTROL) 0.25 MCG capsule Take 0.25 mcg by mouth daily.    . cloNIDine (CATAPRES) 0.1 MG tablet Take 0.1 mg by mouth 2 (two) times daily.     . collagenase (SANTYL) ointment Apply topically daily. 15 g 0  . diltiazem (CARDIZEM CD) 300 MG 24 hr capsule Take 1 capsule (300 mg total) by mouth daily. 90 capsule 3  . febuxostat (ULORIC) 40 MG tablet Take 1 tablet (40 mg total) by mouth daily. 30 tablet 0  . insulin aspart (NOVOLOG) 100 UNIT/ML injection Inject 8 Units into the skin 3 (three) times daily with meals. 10 mL 11  . insulin glargine (LANTUS) 100 UNIT/ML injection Inject 0.32 mLs (32 Units total) into the skin daily. 10 mL 11  . levothyroxine (SYNTHROID, LEVOTHROID) 50 MCG tablet Take 1 tablet (50 mcg total) by mouth daily. 90 tablet 2  .  ondansetron (ZOFRAN) 4 MG tablet Take 1 tablet (4 mg total) by mouth every 6 (six) hours as needed for nausea. 20 tablet 0  . oxyCODONE (OXY IR/ROXICODONE) 5 MG immediate release tablet Take 1 tablet (5 mg total) by mouth every 6 (six) hours as needed for moderate pain. 30 tablet 0  . pantoprazole (PROTONIX) 40 MG tablet Take 1 tablet (40 mg total) by mouth daily at 6 (six) AM. 30 tablet 0  . polyethylene glycol (MIRALAX / GLYCOLAX) 17 g packet Take 17 g by mouth daily. 14 each 0  . pravastatin (PRAVACHOL) 80 MG tablet Take 1 tablet by mouth at bedtime 90 tablet 1  . senna (SENOKOT) 8.6 MG TABS tablet Take 2 tablets (17.2 mg total) by mouth at bedtime. 120 each 0  . sodium bicarbonate 650 MG tablet Take 1 tablet (650 mg total) by mouth 4 (four) times daily. 360 tablet 1  . sucralfate (CARAFATE) 1 G tablet Take 1 tablet (1 g total) by mouth 4 (four) times daily -  with meals and at bedtime. 120 tablet 0  . VELTASSA 8.4 g packet Take  8.4 g by mouth daily.      No current facility-administered medications on file prior to visit.    Allergies  Allergen Reactions  . Ace Inhibitors   . Actos [Pioglitazone Hydrochloride]   . Dust Mite Extract   . Dye Fdc Red [Erythrosine Red No. 3]   . Fish Allergy    Social History   Socioeconomic History  . Marital status: Widowed    Spouse name: Not on file  . Number of children: Not on file  . Years of education: Not on file  . Highest education level: Not on file  Occupational History  . Occupation: Retired  Scientific laboratory technician  . Financial resource strain: Not on file  . Food insecurity    Worry: Not on file    Inability: Not on file  . Transportation needs    Medical: Not on file    Non-medical: Not on file  Tobacco Use  . Smoking status: Former Research scientist (life sciences)  . Smokeless tobacco: Never Used  . Tobacco comment: quit smoking in 2000  Substance and Sexual Activity  . Alcohol use: No    Alcohol/week: 0.0 standard drinks  . Drug use: No  . Sexual activity: Not on file  Lifestyle  . Physical activity    Days per week: Not on file    Minutes per session: Not on file  . Stress: Not on file  Relationships  . Social Herbalist on phone: Not on file    Gets together: Not on file    Attends religious service: Not on file    Active member of club or organization: Not on file    Attends meetings of clubs or organizations: Not on file    Relationship status: Not on file  . Intimate partner violence    Fear of current or ex partner: Not on file    Emotionally abused: Not on file    Physically abused: Not on file    Forced sexual activity: Not on file  Other Topics Concern  . Not on file  Social History Narrative  . Not on file      Review of Systems  All other systems reviewed and are negative.      Objective:   Physical Exam Constitutional:      General: She is not in acute distress.    Appearance: She  is obese. She is not ill-appearing or  toxic-appearing.  Cardiovascular:     Rate and Rhythm: Regular rhythm. Tachycardia present.     Heart sounds: Normal heart sounds. No murmur.  Pulmonary:     Effort: Pulmonary effort is normal.     Breath sounds: Normal breath sounds. No wheezing, rhonchi or rales.  Abdominal:     General: Bowel sounds are normal. There is no distension.     Palpations: Abdomen is soft.     Tenderness: There is no abdominal tenderness. There is no guarding or rebound.     Hernia: No hernia is present.  Musculoskeletal:     Right lower leg: No edema.     Left lower leg: No edema.  Neurological:     Mental Status: She is alert.           Assessment & Plan:  1. Type II diabetes mellitus with end-stage renal disease ([place]) Reported sugars are well controlled. Check HgA1c.  BP is well controlled. - Hemoglobin A1c - CBC with Differential/Platelet - COMPLETE METABOLIC PANEL WITH GFR - Lipid panel  2. Chronic kidney disease (CKD), stage IV (severe) ([place]) Monitor renal fcn with bmp.  Continue bicarb for hyperkalemia and metabolic acidosis.   3. Hypothyroidism, unspecified type Check TSH 4. Hospital discharge follow-up Tachycardic.  Up toprol xl to 50 mg poqday.  In NSR today.  No further episodes of a fib.  Continue aspirin for now.  No ruq pain of evidence of biliary colic.  Monitor LFT's

## 2019-01-24 LAB — CBC WITH DIFFERENTIAL/PLATELET
Absolute Monocytes: 554 cells/uL (ref 200–950)
Basophils Absolute: 40 cells/uL (ref 0–200)
Basophils Relative: 0.6 %
Eosinophils Absolute: 172 cells/uL (ref 15–500)
Eosinophils Relative: 2.6 %
HCT: 27 % — ABNORMAL LOW (ref 35.0–45.0)
Hemoglobin: 8.5 g/dL — ABNORMAL LOW (ref 11.7–15.5)
Lymphs Abs: 2475 cells/uL (ref 850–3900)
MCH: 28.4 pg (ref 27.0–33.0)
MCHC: 31.5 g/dL — ABNORMAL LOW (ref 32.0–36.0)
MCV: 90.3 fL (ref 80.0–100.0)
MPV: 10.3 fL (ref 7.5–12.5)
Monocytes Relative: 8.4 %
Neutro Abs: 3359 cells/uL (ref 1500–7800)
Neutrophils Relative %: 50.9 %
Platelets: 319 10*3/uL (ref 140–400)
RBC: 2.99 10*6/uL — ABNORMAL LOW (ref 3.80–5.10)
RDW: 15.8 % — ABNORMAL HIGH (ref 11.0–15.0)
Total Lymphocyte: 37.5 %
WBC: 6.6 10*3/uL (ref 3.8–10.8)

## 2019-01-24 LAB — COMPLETE METABOLIC PANEL WITH GFR
AG Ratio: 1.2 (calc) (ref 1.0–2.5)
ALT: 10 U/L (ref 6–29)
AST: 11 U/L (ref 10–35)
Albumin: 3.7 g/dL (ref 3.6–5.1)
Alkaline phosphatase (APISO): 74 U/L (ref 37–153)
BUN/Creatinine Ratio: 16 (calc) (ref 6–22)
BUN: 71 mg/dL — ABNORMAL HIGH (ref 7–25)
CO2: 23 mmol/L (ref 20–32)
Calcium: 9.8 mg/dL (ref 8.6–10.4)
Chloride: 103 mmol/L (ref 98–110)
Creat: 4.38 mg/dL — ABNORMAL HIGH (ref 0.60–0.93)
GFR, Est African American: 11 mL/min/{1.73_m2} — ABNORMAL LOW (ref >=60)
GFR, Est Non African American: 9 mL/min/{1.73_m2} — ABNORMAL LOW (ref >=60)
Globulin: 3 g/dL (calc) (ref 1.9–3.7)
Glucose, Bld: 243 mg/dL — ABNORMAL HIGH (ref 65–99)
Potassium: 5.5 mmol/L — ABNORMAL HIGH (ref 3.5–5.3)
Sodium: 137 mmol/L (ref 135–146)
Total Bilirubin: 0.4 mg/dL (ref 0.2–1.2)
Total Protein: 6.7 g/dL (ref 6.1–8.1)

## 2019-01-24 LAB — LIPID PANEL
Cholesterol: 204 mg/dL — ABNORMAL HIGH (ref <200)
HDL: 44 mg/dL — ABNORMAL LOW (ref >=50)
LDL Cholesterol (Calc): 129 mg/dL (calc) — ABNORMAL HIGH
Non-HDL Cholesterol (Calc): 160 mg/dL (calc) — ABNORMAL HIGH (ref <130)
Total CHOL/HDL Ratio: 4.6 (calc) (ref <5.0)
Triglycerides: 171 mg/dL — ABNORMAL HIGH (ref <150)

## 2019-01-24 LAB — HEMOGLOBIN A1C
Hgb A1c MFr Bld: 7.4 % of total Hgb — ABNORMAL HIGH (ref <5.7)
Mean Plasma Glucose: 166 (calc)
eAG (mmol/L): 9.2 (calc)

## 2019-01-28 DIAGNOSIS — M12812 Other specific arthropathies, not elsewhere classified, left shoulder: Secondary | ICD-10-CM | POA: Diagnosis not present

## 2019-01-28 DIAGNOSIS — M6281 Muscle weakness (generalized): Secondary | ICD-10-CM | POA: Diagnosis not present

## 2019-01-28 DIAGNOSIS — S31829D Unspecified open wound of left buttock, subsequent encounter: Secondary | ICD-10-CM | POA: Diagnosis not present

## 2019-01-28 DIAGNOSIS — A419 Sepsis, unspecified organism: Secondary | ICD-10-CM | POA: Diagnosis not present

## 2019-01-29 DIAGNOSIS — Z794 Long term (current) use of insulin: Secondary | ICD-10-CM | POA: Diagnosis not present

## 2019-01-29 DIAGNOSIS — M10061 Idiopathic gout, right knee: Secondary | ICD-10-CM | POA: Diagnosis not present

## 2019-01-29 DIAGNOSIS — I129 Hypertensive chronic kidney disease with stage 1 through stage 4 chronic kidney disease, or unspecified chronic kidney disease: Secondary | ICD-10-CM | POA: Diagnosis not present

## 2019-01-29 DIAGNOSIS — E785 Hyperlipidemia, unspecified: Secondary | ICD-10-CM | POA: Diagnosis not present

## 2019-01-29 DIAGNOSIS — E039 Hypothyroidism, unspecified: Secondary | ICD-10-CM | POA: Diagnosis not present

## 2019-01-29 DIAGNOSIS — M5030 Other cervical disc degeneration, unspecified cervical region: Secondary | ICD-10-CM | POA: Diagnosis not present

## 2019-01-29 DIAGNOSIS — E43 Unspecified severe protein-calorie malnutrition: Secondary | ICD-10-CM | POA: Diagnosis not present

## 2019-01-29 DIAGNOSIS — L0231 Cutaneous abscess of buttock: Secondary | ICD-10-CM | POA: Diagnosis not present

## 2019-01-29 DIAGNOSIS — N184 Chronic kidney disease, stage 4 (severe): Secondary | ICD-10-CM | POA: Diagnosis not present

## 2019-01-29 DIAGNOSIS — E8809 Other disorders of plasma-protein metabolism, not elsewhere classified: Secondary | ICD-10-CM | POA: Diagnosis not present

## 2019-01-29 DIAGNOSIS — I2510 Atherosclerotic heart disease of native coronary artery without angina pectoris: Secondary | ICD-10-CM | POA: Diagnosis not present

## 2019-01-29 DIAGNOSIS — L98412 Non-pressure chronic ulcer of buttock with fat layer exposed: Secondary | ICD-10-CM | POA: Diagnosis not present

## 2019-01-29 DIAGNOSIS — D631 Anemia in chronic kidney disease: Secondary | ICD-10-CM | POA: Diagnosis not present

## 2019-01-29 DIAGNOSIS — K219 Gastro-esophageal reflux disease without esophagitis: Secondary | ICD-10-CM | POA: Diagnosis not present

## 2019-01-29 DIAGNOSIS — I482 Chronic atrial fibrillation, unspecified: Secondary | ICD-10-CM | POA: Diagnosis not present

## 2019-01-29 DIAGNOSIS — E1122 Type 2 diabetes mellitus with diabetic chronic kidney disease: Secondary | ICD-10-CM | POA: Diagnosis not present

## 2019-01-29 DIAGNOSIS — K5900 Constipation, unspecified: Secondary | ICD-10-CM | POA: Diagnosis not present

## 2019-01-29 DIAGNOSIS — E669 Obesity, unspecified: Secondary | ICD-10-CM | POA: Diagnosis not present

## 2019-01-31 DIAGNOSIS — L0231 Cutaneous abscess of buttock: Secondary | ICD-10-CM | POA: Diagnosis not present

## 2019-01-31 DIAGNOSIS — E11622 Type 2 diabetes mellitus with other skin ulcer: Secondary | ICD-10-CM | POA: Diagnosis not present

## 2019-01-31 DIAGNOSIS — L98412 Non-pressure chronic ulcer of buttock with fat layer exposed: Secondary | ICD-10-CM | POA: Diagnosis not present

## 2019-02-02 ENCOUNTER — Telehealth: Payer: Self-pay

## 2019-02-02 NOTE — Telephone Encounter (Signed)
Faxed for to [facility].

## 2019-02-12 ENCOUNTER — Other Ambulatory Visit: Payer: Self-pay

## 2019-02-12 DIAGNOSIS — H52203 Unspecified astigmatism, bilateral: Secondary | ICD-10-CM | POA: Diagnosis not present

## 2019-02-12 DIAGNOSIS — H04123 Dry eye syndrome of bilateral lacrimal glands: Secondary | ICD-10-CM | POA: Diagnosis not present

## 2019-02-12 DIAGNOSIS — Z20822 Contact with and (suspected) exposure to covid-19: Secondary | ICD-10-CM

## 2019-02-12 DIAGNOSIS — E119 Type 2 diabetes mellitus without complications: Secondary | ICD-10-CM | POA: Diagnosis not present

## 2019-02-12 DIAGNOSIS — Z961 Presence of intraocular lens: Secondary | ICD-10-CM | POA: Diagnosis not present

## 2019-02-12 DIAGNOSIS — Z794 Long term (current) use of insulin: Secondary | ICD-10-CM | POA: Diagnosis not present

## 2019-02-12 DIAGNOSIS — H524 Presbyopia: Secondary | ICD-10-CM | POA: Diagnosis not present

## 2019-02-12 DIAGNOSIS — H5212 Myopia, left eye: Secondary | ICD-10-CM | POA: Diagnosis not present

## 2019-02-12 LAB — HM DIABETES EYE EXAM

## 2019-02-14 ENCOUNTER — Encounter (HOSPITAL_BASED_OUTPATIENT_CLINIC_OR_DEPARTMENT_OTHER): Payer: PPO | Attending: Internal Medicine

## 2019-02-14 DIAGNOSIS — L97822 Non-pressure chronic ulcer of other part of left lower leg with fat layer exposed: Secondary | ICD-10-CM | POA: Diagnosis not present

## 2019-02-14 DIAGNOSIS — I2510 Atherosclerotic heart disease of native coronary artery without angina pectoris: Secondary | ICD-10-CM | POA: Insufficient documentation

## 2019-02-14 DIAGNOSIS — I129 Hypertensive chronic kidney disease with stage 1 through stage 4 chronic kidney disease, or unspecified chronic kidney disease: Secondary | ICD-10-CM | POA: Insufficient documentation

## 2019-02-14 DIAGNOSIS — E1122 Type 2 diabetes mellitus with diabetic chronic kidney disease: Secondary | ICD-10-CM | POA: Insufficient documentation

## 2019-02-14 DIAGNOSIS — N184 Chronic kidney disease, stage 4 (severe): Secondary | ICD-10-CM | POA: Insufficient documentation

## 2019-02-14 DIAGNOSIS — Z87891 Personal history of nicotine dependence: Secondary | ICD-10-CM | POA: Insufficient documentation

## 2019-02-14 DIAGNOSIS — Z794 Long term (current) use of insulin: Secondary | ICD-10-CM | POA: Diagnosis not present

## 2019-02-14 DIAGNOSIS — L98412 Non-pressure chronic ulcer of buttock with fat layer exposed: Secondary | ICD-10-CM | POA: Diagnosis not present

## 2019-02-14 DIAGNOSIS — T8189XA Other complications of procedures, not elsewhere classified, initial encounter: Secondary | ICD-10-CM | POA: Diagnosis not present

## 2019-02-14 LAB — NOVEL CORONAVIRUS, NAA: SARS-CoV-2, NAA: NOT DETECTED

## 2019-02-16 ENCOUNTER — Other Ambulatory Visit: Payer: Self-pay | Admitting: Family Medicine

## 2019-02-16 MED ORDER — PRAVASTATIN SODIUM 80 MG PO TABS: 80.0000 mg | ORAL_TABLET | Freq: Every day | ORAL | 1 refills | Status: DC

## 2019-02-28 DIAGNOSIS — E43 Unspecified severe protein-calorie malnutrition: Secondary | ICD-10-CM | POA: Diagnosis not present

## 2019-02-28 DIAGNOSIS — L97822 Non-pressure chronic ulcer of other part of left lower leg with fat layer exposed: Secondary | ICD-10-CM | POA: Diagnosis not present

## 2019-02-28 DIAGNOSIS — N184 Chronic kidney disease, stage 4 (severe): Secondary | ICD-10-CM | POA: Diagnosis not present

## 2019-02-28 DIAGNOSIS — S31829D Unspecified open wound of left buttock, subsequent encounter: Secondary | ICD-10-CM | POA: Diagnosis not present

## 2019-02-28 DIAGNOSIS — M5030 Other cervical disc degeneration, unspecified cervical region: Secondary | ICD-10-CM | POA: Diagnosis not present

## 2019-02-28 DIAGNOSIS — I129 Hypertensive chronic kidney disease with stage 1 through stage 4 chronic kidney disease, or unspecified chronic kidney disease: Secondary | ICD-10-CM | POA: Diagnosis not present

## 2019-02-28 DIAGNOSIS — I482 Chronic atrial fibrillation, unspecified: Secondary | ICD-10-CM | POA: Diagnosis not present

## 2019-02-28 DIAGNOSIS — K5900 Constipation, unspecified: Secondary | ICD-10-CM | POA: Diagnosis not present

## 2019-02-28 DIAGNOSIS — E8809 Other disorders of plasma-protein metabolism, not elsewhere classified: Secondary | ICD-10-CM | POA: Diagnosis not present

## 2019-02-28 DIAGNOSIS — E039 Hypothyroidism, unspecified: Secondary | ICD-10-CM | POA: Diagnosis not present

## 2019-02-28 DIAGNOSIS — E785 Hyperlipidemia, unspecified: Secondary | ICD-10-CM | POA: Diagnosis not present

## 2019-02-28 DIAGNOSIS — E1122 Type 2 diabetes mellitus with diabetic chronic kidney disease: Secondary | ICD-10-CM | POA: Diagnosis not present

## 2019-02-28 DIAGNOSIS — A419 Sepsis, unspecified organism: Secondary | ICD-10-CM | POA: Diagnosis not present

## 2019-02-28 DIAGNOSIS — M6281 Muscle weakness (generalized): Secondary | ICD-10-CM | POA: Diagnosis not present

## 2019-02-28 DIAGNOSIS — L0231 Cutaneous abscess of buttock: Secondary | ICD-10-CM | POA: Diagnosis not present

## 2019-02-28 DIAGNOSIS — D631 Anemia in chronic kidney disease: Secondary | ICD-10-CM | POA: Diagnosis not present

## 2019-02-28 DIAGNOSIS — K219 Gastro-esophageal reflux disease without esophagitis: Secondary | ICD-10-CM | POA: Diagnosis not present

## 2019-02-28 DIAGNOSIS — E669 Obesity, unspecified: Secondary | ICD-10-CM | POA: Diagnosis not present

## 2019-02-28 DIAGNOSIS — L98412 Non-pressure chronic ulcer of buttock with fat layer exposed: Secondary | ICD-10-CM | POA: Diagnosis not present

## 2019-02-28 DIAGNOSIS — M10061 Idiopathic gout, right knee: Secondary | ICD-10-CM | POA: Diagnosis not present

## 2019-02-28 DIAGNOSIS — M12812 Other specific arthropathies, not elsewhere classified, left shoulder: Secondary | ICD-10-CM | POA: Diagnosis not present

## 2019-02-28 DIAGNOSIS — L98419 Non-pressure chronic ulcer of buttock with unspecified severity: Secondary | ICD-10-CM | POA: Diagnosis not present

## 2019-02-28 DIAGNOSIS — Z794 Long term (current) use of insulin: Secondary | ICD-10-CM | POA: Diagnosis not present

## 2019-02-28 DIAGNOSIS — I2510 Atherosclerotic heart disease of native coronary artery without angina pectoris: Secondary | ICD-10-CM | POA: Diagnosis not present

## 2019-03-02 DIAGNOSIS — N2581 Secondary hyperparathyroidism of renal origin: Secondary | ICD-10-CM | POA: Diagnosis not present

## 2019-03-02 DIAGNOSIS — D631 Anemia in chronic kidney disease: Secondary | ICD-10-CM | POA: Diagnosis not present

## 2019-03-02 DIAGNOSIS — N189 Chronic kidney disease, unspecified: Secondary | ICD-10-CM | POA: Diagnosis not present

## 2019-03-02 DIAGNOSIS — I120 Hypertensive chronic kidney disease with stage 5 chronic kidney disease or end stage renal disease: Secondary | ICD-10-CM | POA: Diagnosis not present

## 2019-03-02 DIAGNOSIS — E1122 Type 2 diabetes mellitus with diabetic chronic kidney disease: Secondary | ICD-10-CM | POA: Diagnosis not present

## 2019-03-02 DIAGNOSIS — N185 Chronic kidney disease, stage 5: Secondary | ICD-10-CM | POA: Diagnosis not present

## 2019-03-31 DIAGNOSIS — S31829D Unspecified open wound of left buttock, subsequent encounter: Secondary | ICD-10-CM | POA: Diagnosis not present

## 2019-03-31 DIAGNOSIS — A419 Sepsis, unspecified organism: Secondary | ICD-10-CM | POA: Diagnosis not present

## 2019-03-31 DIAGNOSIS — M6281 Muscle weakness (generalized): Secondary | ICD-10-CM | POA: Diagnosis not present

## 2019-03-31 DIAGNOSIS — M12812 Other specific arthropathies, not elsewhere classified, left shoulder: Secondary | ICD-10-CM | POA: Diagnosis not present

## 2019-04-17 ENCOUNTER — Ambulatory Visit: Payer: PPO

## 2019-04-30 DIAGNOSIS — M6281 Muscle weakness (generalized): Secondary | ICD-10-CM | POA: Diagnosis not present

## 2019-04-30 DIAGNOSIS — M12812 Other specific arthropathies, not elsewhere classified, left shoulder: Secondary | ICD-10-CM | POA: Diagnosis not present

## 2019-04-30 DIAGNOSIS — A419 Sepsis, unspecified organism: Secondary | ICD-10-CM | POA: Diagnosis not present

## 2019-04-30 DIAGNOSIS — S31829D Unspecified open wound of left buttock, subsequent encounter: Secondary | ICD-10-CM | POA: Diagnosis not present

## 2019-05-29 ENCOUNTER — Other Ambulatory Visit: Payer: Self-pay | Admitting: Family Medicine

## 2019-05-29 DIAGNOSIS — E1122 Type 2 diabetes mellitus with diabetic chronic kidney disease: Secondary | ICD-10-CM | POA: Diagnosis not present

## 2019-05-29 DIAGNOSIS — N185 Chronic kidney disease, stage 5: Secondary | ICD-10-CM | POA: Diagnosis not present

## 2019-05-29 DIAGNOSIS — N2581 Secondary hyperparathyroidism of renal origin: Secondary | ICD-10-CM | POA: Diagnosis not present

## 2019-05-29 DIAGNOSIS — D631 Anemia in chronic kidney disease: Secondary | ICD-10-CM | POA: Diagnosis not present

## 2019-05-29 DIAGNOSIS — I120 Hypertensive chronic kidney disease with stage 5 chronic kidney disease or end stage renal disease: Secondary | ICD-10-CM | POA: Diagnosis not present

## 2019-05-29 DIAGNOSIS — N189 Chronic kidney disease, unspecified: Secondary | ICD-10-CM | POA: Diagnosis not present

## 2019-05-29 MED ORDER — LEVOTHYROXINE SODIUM 50 MCG PO TABS: 50.0000 ug | ORAL_TABLET | Freq: Every day | ORAL | 2 refills | Status: DC

## 2019-05-31 DIAGNOSIS — M6281 Muscle weakness (generalized): Secondary | ICD-10-CM | POA: Diagnosis not present

## 2019-05-31 DIAGNOSIS — M12812 Other specific arthropathies, not elsewhere classified, left shoulder: Secondary | ICD-10-CM | POA: Diagnosis not present

## 2019-05-31 DIAGNOSIS — S31829D Unspecified open wound of left buttock, subsequent encounter: Secondary | ICD-10-CM | POA: Diagnosis not present

## 2019-05-31 DIAGNOSIS — A419 Sepsis, unspecified organism: Secondary | ICD-10-CM | POA: Diagnosis not present

## 2019-06-04 ENCOUNTER — Other Ambulatory Visit: Payer: Self-pay | Admitting: Family Medicine

## 2019-06-05 MED ORDER — METOPROLOL SUCCINATE ER 50 MG PO TB24: 50.0000 mg | ORAL_TABLET | Freq: Every day | ORAL | 3 refills | Status: DC

## 2019-06-14 ENCOUNTER — Other Ambulatory Visit: Payer: Self-pay | Admitting: Family Medicine

## 2019-06-14 MED ORDER — LEVOTHYROXINE SODIUM 50 MCG PO TABS: 50.0000 ug | ORAL_TABLET | Freq: Every day | ORAL | 2 refills | Status: DC

## 2019-06-30 DIAGNOSIS — A419 Sepsis, unspecified organism: Secondary | ICD-10-CM | POA: Diagnosis not present

## 2019-06-30 DIAGNOSIS — S31829D Unspecified open wound of left buttock, subsequent encounter: Secondary | ICD-10-CM | POA: Diagnosis not present

## 2019-06-30 DIAGNOSIS — M12812 Other specific arthropathies, not elsewhere classified, left shoulder: Secondary | ICD-10-CM | POA: Diagnosis not present

## 2019-06-30 DIAGNOSIS — M6281 Muscle weakness (generalized): Secondary | ICD-10-CM | POA: Diagnosis not present

## 2019-08-29 DIAGNOSIS — N2581 Secondary hyperparathyroidism of renal origin: Secondary | ICD-10-CM | POA: Diagnosis not present

## 2019-08-29 DIAGNOSIS — N189 Chronic kidney disease, unspecified: Secondary | ICD-10-CM | POA: Diagnosis not present

## 2019-08-29 DIAGNOSIS — E1122 Type 2 diabetes mellitus with diabetic chronic kidney disease: Secondary | ICD-10-CM | POA: Diagnosis not present

## 2019-08-29 DIAGNOSIS — D631 Anemia in chronic kidney disease: Secondary | ICD-10-CM | POA: Diagnosis not present

## 2019-08-29 DIAGNOSIS — I120 Hypertensive chronic kidney disease with stage 5 chronic kidney disease or end stage renal disease: Secondary | ICD-10-CM | POA: Diagnosis not present

## 2019-08-29 DIAGNOSIS — N185 Chronic kidney disease, stage 5: Secondary | ICD-10-CM | POA: Diagnosis not present

## 2019-10-17 ENCOUNTER — Other Ambulatory Visit: Payer: Self-pay | Admitting: Family Medicine

## 2019-10-17 MED ORDER — LEVOTHYROXINE SODIUM 50 MCG PO TABS: 50.0000 ug | ORAL_TABLET | Freq: Every day | ORAL | 2 refills | Status: DC

## 2019-12-13 ENCOUNTER — Other Ambulatory Visit: Payer: Self-pay | Admitting: Family Medicine

## 2020-01-01 ENCOUNTER — Other Ambulatory Visit: Payer: Self-pay

## 2020-01-01 MED ORDER — DILTIAZEM HCL ER COATED BEADS 300 MG PO CP24: 300.0000 mg | ORAL_CAPSULE | Freq: Every day | ORAL | 0 refills | Status: DC

## 2020-01-01 NOTE — Telephone Encounter (Signed)
Okay to send in 30 tabs, pt needs appt with Dr. Dennard Schaumann

## 2020-01-01 NOTE — Telephone Encounter (Signed)
Ok to refill?  Last OV 01/29/19 Last refill 10/02/18

## 2020-01-08 ENCOUNTER — Other Ambulatory Visit: Payer: Self-pay

## 2020-01-24 DIAGNOSIS — E1122 Type 2 diabetes mellitus with diabetic chronic kidney disease: Secondary | ICD-10-CM | POA: Diagnosis not present

## 2020-01-24 DIAGNOSIS — N2581 Secondary hyperparathyroidism of renal origin: Secondary | ICD-10-CM | POA: Diagnosis not present

## 2020-01-24 DIAGNOSIS — N189 Chronic kidney disease, unspecified: Secondary | ICD-10-CM | POA: Diagnosis not present

## 2020-01-24 DIAGNOSIS — D631 Anemia in chronic kidney disease: Secondary | ICD-10-CM | POA: Diagnosis not present

## 2020-01-24 DIAGNOSIS — I120 Hypertensive chronic kidney disease with stage 5 chronic kidney disease or end stage renal disease: Secondary | ICD-10-CM | POA: Diagnosis not present

## 2020-01-24 DIAGNOSIS — N185 Chronic kidney disease, stage 5: Secondary | ICD-10-CM | POA: Diagnosis not present

## 2020-02-07 ENCOUNTER — Ambulatory Visit (INDEPENDENT_AMBULATORY_CARE_PROVIDER_SITE_OTHER): Payer: Medicare Other | Admitting: Family Medicine

## 2020-02-07 ENCOUNTER — Other Ambulatory Visit: Payer: Self-pay

## 2020-02-07 VITALS — BP 120/50 | HR 73 | Temp 96.8°F | Wt 230.0 lb

## 2020-02-07 DIAGNOSIS — E039 Hypothyroidism, unspecified: Secondary | ICD-10-CM

## 2020-02-07 DIAGNOSIS — E1122 Type 2 diabetes mellitus with diabetic chronic kidney disease: Secondary | ICD-10-CM | POA: Diagnosis not present

## 2020-02-07 DIAGNOSIS — N184 Chronic kidney disease, stage 4 (severe): Secondary | ICD-10-CM | POA: Diagnosis not present

## 2020-02-07 DIAGNOSIS — N186 End stage renal disease: Secondary | ICD-10-CM

## 2020-02-07 NOTE — Progress Notes (Signed)
Subjective:    Patient ID: Emily Hughes, female    DOB: January 14, 1941, 79 y.o.   MRN: [id number]  HPI I have not seen the patient in over a year.  However she is following up with her nephrologist every 4 months.  She recently saw her nephrologist last month and she was told that her creatinine was 3.9.  This is actually better than the last time I saw the patient.  She denies any symptoms of uremia.  She denies any fluid retention or shortness of breath.  She has trace bipedal edema.  There is no apparent urgent indication for dialysis at the present time.  She is checking her blood sugar on a daily basis and states that her blood sugars are typically 100-120.  At the present time she is using 30 units of long-acting insulin in the morning.  She uses 8 units of rapid insulin with lunch.  She denies any hypoglycemic episodes.  She denies any numbness in her feet.  She denies any polyuria or polydipsia.  She denies any blurry vision.  Diabetic foot exam was performed today and is normal.  Patient has had her Covid vaccination. Past Medical History:  Diagnosis Date  . Anemia   . CAD (coronary artery disease)   . Colon polyps   . CRI (chronic renal insufficiency)   . DDD (degenerative disc disease)   . Diabetes mellitus    INSULIN DEPENDENT  . GERD (gastroesophageal reflux disease)   . High cholesterol   . Hypertension   . Pancreatitis   . Proteinuria    Past Surgical History:  Procedure Laterality Date  . ABDOMINAL HYSTERECTOMY    . ANGIOPLASTY    . ANKLE SURGERY Right   . BACK SURGERY    . INCISION AND DRAINAGE ABSCESS N/A 11/09/2018   Procedure: INCISION AND DRAINAGE ABSCESS;  Surgeon: Coralie Keens, MD;  Location: [facility];  Service: General;  Laterality: N/A;  . TONSILLECTOMY     Current Outpatient Medications on File Prior to Visit  Medication Sig Dispense Refill  . acetaminophen (TYLENOL) 325 MG tablet Take 2 tablets (650 mg total) by mouth every 6 (six) hours as needed for  mild pain (or Fever >/= 101).    Marland Kitchen aspirin 81 MG tablet Take 81 mg by mouth daily.      . B-D ULTRAFINE III SHORT PEN 31G X 8 MM MISC USE WITH LANTUS. 100 each 0  . calcitRIOL (ROCALTROL) 0.25 MCG capsule Take 0.25 mcg by mouth daily.    . cloNIDine (CATAPRES) 0.1 MG tablet Take 0.1 mg by mouth 2 (two) times daily.     . colchicine 0.6 MG tablet TAKE (1/2) TABLET DAILY. 15 tablet 0  . collagenase (SANTYL) ointment Apply topically daily. 15 g 0  . diltiazem (CARDIZEM CD) 300 MG 24 hr capsule Take 1 capsule (300 mg total) by mouth daily. 30 capsule 0  . febuxostat (ULORIC) 40 MG tablet Take 1 tablet (40 mg total) by mouth daily. 30 tablet 0  . furosemide (LASIX) 40 MG tablet TAKE 2 TABLETS IN THE MORNING AND 1 TABLET IN THE EVENING. 270 tablet 1  . insulin aspart (NOVOLOG) 100 UNIT/ML injection Inject 8 Units into the skin 3 (three) times daily with meals. 10 mL 11  . insulin glargine (LANTUS) 100 UNIT/ML injection Inject 0.32 mLs (32 Units total) into the skin daily. 10 mL 11  . levothyroxine (SYNTHROID) 50 MCG tablet Take 1 tablet (50 mcg total) by mouth daily. 90 tablet  2  . metoprolol succinate (TOPROL-XL) 50 MG 24 hr tablet Take 1 tablet (50 mg total) by mouth daily. Take with or immediately following a meal. 90 tablet 3  . ondansetron (ZOFRAN) 4 MG tablet Take 1 tablet (4 mg total) by mouth every 6 (six) hours as needed for nausea. 20 tablet 0  . oxyCODONE (OXY IR/ROXICODONE) 5 MG immediate release tablet Take 1 tablet (5 mg total) by mouth every 6 (six) hours as needed for moderate pain. 30 tablet 0  . pantoprazole (PROTONIX) 40 MG tablet Take 1 tablet (40 mg total) by mouth daily at 6 (six) AM. 30 tablet 0  . polyethylene glycol (MIRALAX / GLYCOLAX) 17 g packet Take 17 g by mouth daily. 14 each 0  . pravastatin (PRAVACHOL) 80 MG tablet Take 1 tablet (80 mg total) by mouth at bedtime. 90 tablet 1  . senna (SENOKOT) 8.6 MG TABS tablet Take 2 tablets (17.2 mg total) by mouth at bedtime. 120  each 0  . sodium bicarbonate 650 MG tablet Take 1 tablet (650 mg total) by mouth 4 (four) times daily. 360 tablet 1  . sucralfate (CARAFATE) 1 G tablet Take 1 tablet (1 g total) by mouth 4 (four) times daily -  with meals and at bedtime. 120 tablet 0  . VELTASSA 8.4 g packet Take 8.4 g by mouth daily.      No current facility-administered medications on file prior to visit.   Allergies  Allergen Reactions  . Ace Inhibitors   . Actos [Pioglitazone Hydrochloride]   . Dust Mite Extract   . Dye Fdc Red [Erythrosine Red No. 3]   . Fish Allergy    Social History   Socioeconomic History  . Marital status: Widowed    Spouse name: Not on file  . Number of children: Not on file  . Years of education: Not on file  . Highest education level: Not on file  Occupational History  . Occupation: Retired  Tobacco Use  . Smoking status: Former Research scientist (life sciences)  . Smokeless tobacco: Never Used  . Tobacco comment: quit smoking in 2000  Substance and Sexual Activity  . Alcohol use: No    Alcohol/week: 0.0 standard drinks  . Drug use: No  . Sexual activity: Not on file  Other Topics Concern  . Not on file  Social History Narrative  . Not on file   Social Determinants of Health   Financial Resource Strain:   . Difficulty of Paying Living Expenses:   Food Insecurity:   . Worried About Charity fundraiser in the Last Year:   . Arboriculturist in the Last Year:   Transportation Needs:   . Film/video editor (Medical):   Marland Kitchen Lack of Transportation (Non-Medical):   Physical Activity:   . Days of Exercise per Week:   . Minutes of Exercise per Session:   Stress:   . Feeling of Stress :   Social Connections:   . Frequency of Communication with Friends and Family:   . Frequency of Social Gatherings with Friends and Family:   . Attends Religious Services:   . Active Member of Clubs or Organizations:   . Attends Archivist Meetings:   Marland Kitchen Marital Status:   Intimate Partner Violence:   .  Fear of Current or Ex-Partner:   . Emotionally Abused:   Marland Kitchen Physically Abused:   . Sexually Abused:       Review of Systems  All other systems reviewed and are  negative.      Objective:   Physical Exam Constitutional:      General: She is not in acute distress.    Appearance: She is obese. She is not ill-appearing or toxic-appearing.  Cardiovascular:     Rate and Rhythm: Normal rate and regular rhythm.     Heart sounds: Normal heart sounds. No murmur heard.   Pulmonary:     Effort: Pulmonary effort is normal.     Breath sounds: Normal breath sounds. No wheezing, rhonchi or rales.  Abdominal:     General: Bowel sounds are normal. There is no distension.     Palpations: Abdomen is soft.     Tenderness: There is no abdominal tenderness. There is no guarding or rebound.     Hernia: No hernia is present.  Musculoskeletal:     Right lower leg: No edema.     Left lower leg: No edema.  Neurological:     Mental Status: She is alert.           Assessment & Plan:  Type II diabetes mellitus with end-stage renal disease ([facility]) - Plan: CBC with Differential/Platelet, COMPLETE METABOLIC PANEL WITH GFR, Lipid panel, Hemoglobin A1c  Chronic kidney disease (CKD), stage IV (severe) (HCC)  Hypothyroidism, unspecified type - Plan: TSH  Check a hemoglobin A1c.  I would be happy with a hemoglobin A1c less than 7.  Also monitor CBC and CMP to evaluate her potassium and bicarbonate levels along with monitoring her anemia given her chronic kidney disease.  Check a TSH to ensure adequate dosage of her levothyroxine.  Blood pressure today is excellent.  Immunizations are up-to-date.  Recommended annual diabetic eye exam.  Diabetic foot exam was performed today and is normal.  Patient's immunizations are up-to-date.

## 2020-02-08 LAB — HEMOGLOBIN A1C
Hgb A1c MFr Bld: 6.7 % of total Hgb — ABNORMAL HIGH (ref <5.7)
Mean Plasma Glucose: 146 (calc)
eAG (mmol/L): 8.1 (calc)

## 2020-02-08 LAB — COMPLETE METABOLIC PANEL WITH GFR
AG Ratio: 1.5 (calc) (ref 1.0–2.5)
ALT: 13 U/L (ref 6–29)
AST: 16 U/L (ref 10–35)
Albumin: 4.3 g/dL (ref 3.6–5.1)
Alkaline phosphatase (APISO): 138 U/L (ref 37–153)
BUN/Creatinine Ratio: 16 (calc) (ref 6–22)
BUN: 68 mg/dL — ABNORMAL HIGH (ref 7–25)
CO2: 24 mmol/L (ref 20–32)
Calcium: 9.9 mg/dL (ref 8.6–10.4)
Chloride: 105 mmol/L (ref 98–110)
Creat: 4.13 mg/dL — ABNORMAL HIGH (ref 0.60–0.93)
GFR, Est African American: 11 mL/min/{1.73_m2} — ABNORMAL LOW (ref >=60)
GFR, Est Non African American: 10 mL/min/{1.73_m2} — ABNORMAL LOW (ref >=60)
Globulin: 2.9 g/dL (calc) (ref 1.9–3.7)
Glucose, Bld: 110 mg/dL — ABNORMAL HIGH (ref 65–99)
Potassium: 5.1 mmol/L (ref 3.5–5.3)
Sodium: 140 mmol/L (ref 135–146)
Total Bilirubin: 0.5 mg/dL (ref 0.2–1.2)
Total Protein: 7.2 g/dL (ref 6.1–8.1)

## 2020-02-08 LAB — CBC WITH DIFFERENTIAL/PLATELET
Absolute Monocytes: 475 cells/uL (ref 200–950)
Basophils Absolute: 33 cells/uL (ref 0–200)
Basophils Relative: 0.5 %
Eosinophils Absolute: 182 cells/uL (ref 15–500)
Eosinophils Relative: 2.8 %
HCT: 31.3 % — ABNORMAL LOW (ref 35.0–45.0)
Hemoglobin: 9.9 g/dL — ABNORMAL LOW (ref 11.7–15.5)
Lymphs Abs: 2405 cells/uL (ref 850–3900)
MCH: 27.9 pg (ref 27.0–33.0)
MCHC: 31.6 g/dL — ABNORMAL LOW (ref 32.0–36.0)
MCV: 88.2 fL (ref 80.0–100.0)
MPV: 10.7 fL (ref 7.5–12.5)
Monocytes Relative: 7.3 %
Neutro Abs: 3406 cells/uL (ref 1500–7800)
Neutrophils Relative %: 52.4 %
Platelets: 264 10*3/uL (ref 140–400)
RBC: 3.55 10*6/uL — ABNORMAL LOW (ref 3.80–5.10)
RDW: 14.4 % (ref 11.0–15.0)
Total Lymphocyte: 37 %
WBC: 6.5 10*3/uL (ref 3.8–10.8)

## 2020-02-08 LAB — LIPID PANEL
Cholesterol: 141 mg/dL (ref <200)
HDL: 38 mg/dL — ABNORMAL LOW (ref >=50)
LDL Cholesterol (Calc): 74 mg/dL (calc)
Non-HDL Cholesterol (Calc): 103 mg/dL (calc) (ref <130)
Total CHOL/HDL Ratio: 3.7 (calc) (ref <5.0)
Triglycerides: 197 mg/dL — ABNORMAL HIGH (ref <150)

## 2020-02-08 LAB — TSH: TSH: 3.12 mIU/L (ref 0.40–4.50)

## 2020-02-21 ENCOUNTER — Other Ambulatory Visit: Payer: Self-pay | Admitting: Family Medicine

## 2020-02-27 ENCOUNTER — Other Ambulatory Visit: Payer: Self-pay

## 2020-02-27 MED ORDER — INSULIN GLARGINE 100 UNIT/ML ~~LOC~~ SOLN: 32.0000 [IU] | Freq: Every day | SUBCUTANEOUS | 11 refills | Status: DC

## 2020-03-05 ENCOUNTER — Other Ambulatory Visit: Payer: Self-pay

## 2020-03-05 MED ORDER — METOPROLOL SUCCINATE ER 50 MG PO TB24: 50.0000 mg | ORAL_TABLET | Freq: Every day | ORAL | 3 refills | Status: DC

## 2020-03-06 ENCOUNTER — Other Ambulatory Visit: Payer: Self-pay | Admitting: *Deleted

## 2020-04-15 ENCOUNTER — Other Ambulatory Visit: Payer: Self-pay | Admitting: Family Medicine

## 2020-05-04 ENCOUNTER — Other Ambulatory Visit: Payer: Self-pay | Admitting: Family Medicine

## 2020-06-10 ENCOUNTER — Other Ambulatory Visit: Payer: Self-pay | Admitting: Family Medicine

## 2020-06-12 DIAGNOSIS — N189 Chronic kidney disease, unspecified: Secondary | ICD-10-CM | POA: Diagnosis not present

## 2020-06-12 DIAGNOSIS — N185 Chronic kidney disease, stage 5: Secondary | ICD-10-CM | POA: Diagnosis not present

## 2020-06-12 DIAGNOSIS — N2581 Secondary hyperparathyroidism of renal origin: Secondary | ICD-10-CM | POA: Diagnosis not present

## 2020-06-12 DIAGNOSIS — D631 Anemia in chronic kidney disease: Secondary | ICD-10-CM | POA: Diagnosis not present

## 2020-06-12 DIAGNOSIS — E1122 Type 2 diabetes mellitus with diabetic chronic kidney disease: Secondary | ICD-10-CM | POA: Diagnosis not present

## 2020-06-12 DIAGNOSIS — I120 Hypertensive chronic kidney disease with stage 5 chronic kidney disease or end stage renal disease: Secondary | ICD-10-CM | POA: Diagnosis not present

## 2020-06-30 ENCOUNTER — Other Ambulatory Visit: Payer: Self-pay

## 2020-06-30 ENCOUNTER — Ambulatory Visit (INDEPENDENT_AMBULATORY_CARE_PROVIDER_SITE_OTHER): Payer: Medicare Other | Admitting: Family Medicine

## 2020-06-30 VITALS — BP 120/58 | HR 90 | Temp 98.5°F

## 2020-06-30 DIAGNOSIS — L0291 Cutaneous abscess, unspecified: Secondary | ICD-10-CM

## 2020-06-30 MED ORDER — DOXYCYCLINE HYCLATE 100 MG PO TABS
100.0000 mg | ORAL_TABLET | Freq: Two times a day (BID) | ORAL | 0 refills | Status: DC
Start: 2020-06-30 — End: 2020-12-19

## 2020-06-30 NOTE — Addendum Note (Signed)
Addended by: Saundra Shelling on: 06/30/2020 03:09 PM   Modules accepted: Orders

## 2020-06-30 NOTE — Progress Notes (Signed)
Subjective:    Patient ID: Emily Hughes, female    DOB: 03/20/1941, 79 y.o.   MRN: [id number]  HPI Patient has a painful boil on her right breast.  Is been there for several days.  It is located approximately 7:00 just below the nipple.  The swollen fluctuant area is approximately 3 cm in diameter.  The skin is erythematous, hot, and extremely painful.  The superficial layer of the skin is starting to desquamate and peel away.  However there is no spontaneous rupture or drainage of the pus cavity. Past Medical History:  Diagnosis Date  . Anemia   . CAD (coronary artery disease)   . Colon polyps   . CRI (chronic renal insufficiency)   . DDD (degenerative disc disease)   . Diabetes mellitus    INSULIN DEPENDENT  . GERD (gastroesophageal reflux disease)   . High cholesterol   . Hypertension   . Pancreatitis   . Proteinuria    Past Surgical History:  Procedure Laterality Date  . ABDOMINAL HYSTERECTOMY    . ANGIOPLASTY    . ANKLE SURGERY Right   . BACK SURGERY    . INCISION AND DRAINAGE ABSCESS N/A 11/09/2018   Procedure: INCISION AND DRAINAGE ABSCESS;  Surgeon: Coralie Keens, MD;  Location: [facility];  Service: General;  Laterality: N/A;  . TONSILLECTOMY     Current Outpatient Medications on File Prior to Visit  Medication Sig Dispense Refill  . acetaminophen (TYLENOL) 325 MG tablet Take 2 tablets (650 mg total) by mouth every 6 (six) hours as needed for mild pain (or Fever >/= 101).    Marland Kitchen aspirin 81 MG tablet Take 81 mg by mouth daily.      . B-D ULTRAFINE III SHORT PEN 31G X 8 MM MISC USE WITH LANTUS. 100 each 0  . calcitRIOL (ROCALTROL) 0.25 MCG capsule Take 0.25 mcg by mouth daily.    . cloNIDine (CATAPRES) 0.1 MG tablet Take 0.1 mg by mouth 2 (two) times daily.     . colchicine 0.6 MG tablet TAKE (1/2) TABLET DAILY. 15 tablet 0  . collagenase (SANTYL) ointment Apply topically daily. 15 g 0  . diltiazem (CARDIZEM CD) 300 MG 24 hr capsule Take 1 capsule (300 mg total) by  mouth daily. 30 capsule 0  . febuxostat (ULORIC) 40 MG tablet Take 1 tablet (40 mg total) by mouth daily. 30 tablet 0  . furosemide (LASIX) 40 MG tablet TAKE 2 TABLETS IN THE MORNING AND 1 TABLET IN THE EVENING. 270 tablet 1  . insulin aspart (NOVOLOG) 100 UNIT/ML injection Inject 8 Units into the skin 3 (three) times daily with meals. (Patient taking differently: Inject 10 Units into the skin once. ) 10 mL 11  . insulin glargine (LANTUS) 100 UNIT/ML injection Inject 0.32 mLs (32 Units total) into the skin daily. 10 mL 11  . levothyroxine (SYNTHROID) 50 MCG tablet TAKE 1 TABLET BY MOUTH  DAILY 90 tablet 3  . metoprolol succinate (TOPROL-XL) 50 MG 24 hr tablet Take 1 tablet (50 mg total) by mouth daily. Take with or immediately following a meal. 90 tablet 3  . ondansetron (ZOFRAN) 4 MG tablet Take 1 tablet (4 mg total) by mouth every 6 (six) hours as needed for nausea. 20 tablet 0  . oxyCODONE (OXY IR/ROXICODONE) 5 MG immediate release tablet Take 1 tablet (5 mg total) by mouth every 6 (six) hours as needed for moderate pain. 30 tablet 0  . pantoprazole (PROTONIX) 40 MG tablet Take 1 tablet (  40 mg total) by mouth daily at 6 (six) AM. 30 tablet 0  . polyethylene glycol (MIRALAX / GLYCOLAX) 17 g packet Take 17 g by mouth daily. 14 each 0  . pravastatin (PRAVACHOL) 80 MG tablet Take 1 tablet (80 mg total) by mouth at bedtime. 90 tablet 1  . senna (SENOKOT) 8.6 MG TABS tablet Take 2 tablets (17.2 mg total) by mouth at bedtime. 120 each 0  . sodium bicarbonate 650 MG tablet Take 1 tablet (650 mg total) by mouth 4 (four) times daily. 360 tablet 1  . sucralfate (CARAFATE) 1 G tablet Take 1 tablet (1 g total) by mouth 4 (four) times daily -  with meals and at bedtime. 120 tablet 0  . VELTASSA 8.4 g packet Take 8.4 g by mouth daily.      No current facility-administered medications on file prior to visit.   Allergies  Allergen Reactions  . 5-Alpha Reductase Inhibitors   . Ace Inhibitors   . Actos  [Pioglitazone Hydrochloride]   . Dust Mite Extract   . Dye Fdc Red [Erythrosine Red No. 3]   . Fish Allergy    Social History   Socioeconomic History  . Marital status: Widowed    Spouse name: Not on file  . Number of children: Not on file  . Years of education: Not on file  . Highest education level: Not on file  Occupational History  . Occupation: Retired  Tobacco Use  . Smoking status: Former Research scientist (life sciences)  . Smokeless tobacco: Never Used  . Tobacco comment: quit smoking in 2000  Substance and Sexual Activity  . Alcohol use: No    Alcohol/week: 0.0 standard drinks  . Drug use: No  . Sexual activity: Not on file  Other Topics Concern  . Not on file  Social History Narrative  . Not on file   Social Determinants of Health   Financial Resource Strain: Not on file  Food Insecurity: Not on file  Transportation Needs: Not on file  Physical Activity: Not on file  Stress: Not on file  Social Connections: Not on file  Intimate Partner Violence: Not on file      Review of Systems  All other systems reviewed and are negative.      Objective:   Physical Exam Constitutional:      General: She is not in acute distress.    Appearance: She is obese. She is not ill-appearing or toxic-appearing.  Cardiovascular:     Rate and Rhythm: Normal rate and regular rhythm.     Heart sounds: Normal heart sounds. No murmur heard.   Pulmonary:     Effort: Pulmonary effort is normal.     Breath sounds: Normal breath sounds. No wheezing, rhonchi or rales.  Chest:    Abdominal:     General: Bowel sounds are normal. There is no distension.     Palpations: Abdomen is soft.     Tenderness: There is no abdominal tenderness. There is no guarding or rebound.     Hernia: No hernia is present.  Musculoskeletal:     Right lower leg: No edema.     Left lower leg: No edema.  Neurological:     Mental Status: She is alert.           Assessment & Plan:  Abscess - Plan: CULTURE,  ROUTINE-ABSCESS  Skin was anesthetized with 0.1% lidocaine without epinephrine.  A 1 cm vertical incision was made directly in the center of the fluctuant cavity.  A pair hemostats was used to open the cavity and allow the copious 15 cc (approx) purulent material to drain.  The wound was probed with Q-tip soaked in peroxide and then packed with 4 inches of 1/4 inch iodoform gauze.  Start the patient on doxycycline 100 mg p.o. twice daily for 10 days.  Wound culture was sent.

## 2020-07-03 LAB — WOUND CULTURE
MICRO NUMBER:: 11358255
SPECIMEN QUALITY:: ADEQUATE

## 2020-07-14 ENCOUNTER — Telehealth: Payer: Self-pay

## 2020-07-14 ENCOUNTER — Other Ambulatory Visit: Payer: Self-pay | Admitting: Family Medicine

## 2020-07-14 MED ORDER — CYCLOBENZAPRINE HCL 5 MG PO TABS: 5.0000 mg | ORAL_TABLET | Freq: Three times a day (TID) | ORAL | 1 refills | Status: DC | PRN

## 2020-07-14 NOTE — Telephone Encounter (Signed)
Patient made aware rx sent into pharmacy

## 2020-07-14 NOTE — Telephone Encounter (Signed)
I sent flexeril to her pharmacy.

## 2020-07-14 NOTE — Telephone Encounter (Signed)
Patient is having muscle spasms in her back, asked if something could be sent in for it?

## 2020-07-29 DIAGNOSIS — D126 Benign neoplasm of colon, unspecified: Secondary | ICD-10-CM | POA: Diagnosis not present

## 2020-07-29 DIAGNOSIS — K219 Gastro-esophageal reflux disease without esophagitis: Secondary | ICD-10-CM | POA: Diagnosis not present

## 2020-08-28 ENCOUNTER — Other Ambulatory Visit: Payer: Self-pay | Admitting: *Deleted

## 2020-08-28 MED ORDER — COLCHICINE 0.6 MG PO TABS: ORAL_TABLET | ORAL | 0 refills | Status: DC

## 2020-08-29 DIAGNOSIS — K219 Gastro-esophageal reflux disease without esophagitis: Secondary | ICD-10-CM | POA: Diagnosis not present

## 2020-09-20 IMAGING — CT CT PELVIS WITHOUT CONTRAST
2 of 3 series · 16 of 46 positions shown, 18 images · non-contrast
Comparison: 10/03/2014

CLINICAL DATA: Painful red area to left buttock.

EXAM:
CT PELVIS WITHOUT CONTRAST
TECHNIQUE: Multidetector CT imaging of the pelvis was performed following the
standard protocol without intravenous contrast.

[Series 5: axial st · axial · 0.98mm/px · z∈[+842,+1096]mm · 13 of 147 slices shown, 15 images]
[im 10/147  soft-tissue]
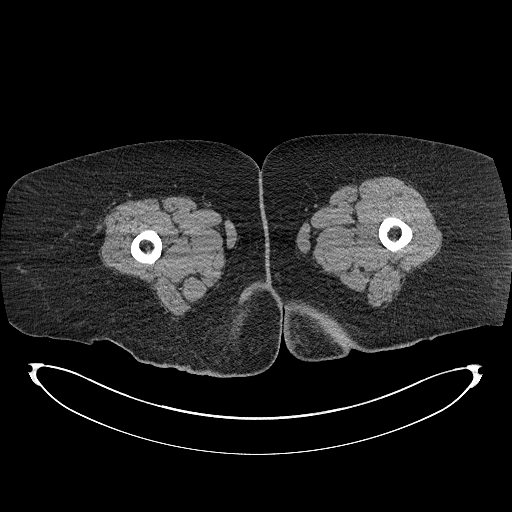
[im 10/147  bone]
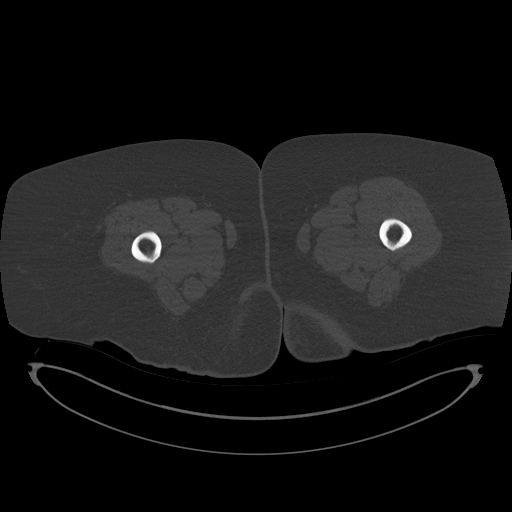
[im 19/147  soft-tissue]
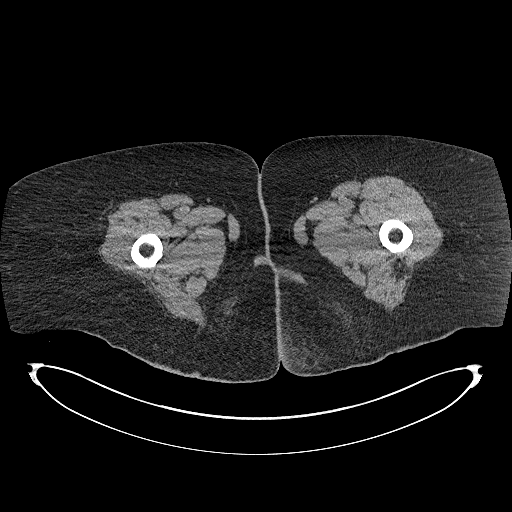
[im 29/147  soft-tissue]
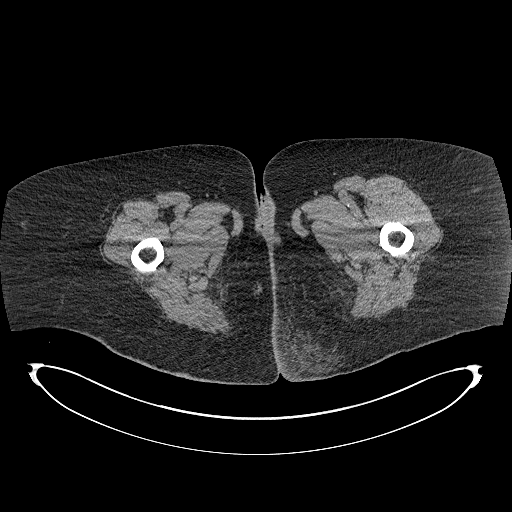
[im 43/147  soft-tissue]
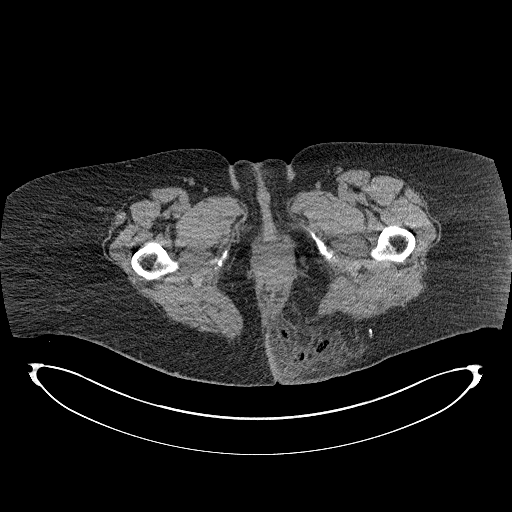
[im 52/147  soft-tissue]
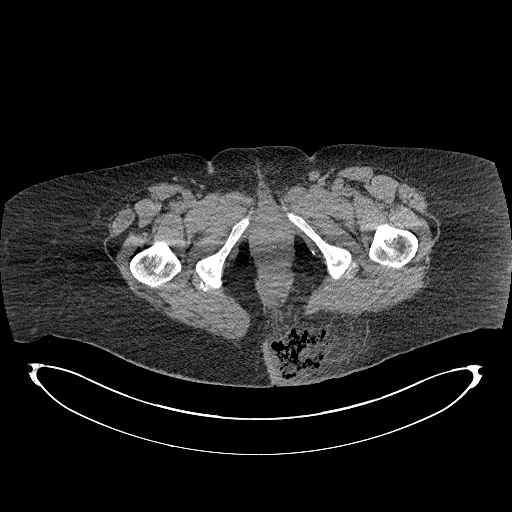
[im 62/147  soft-tissue]
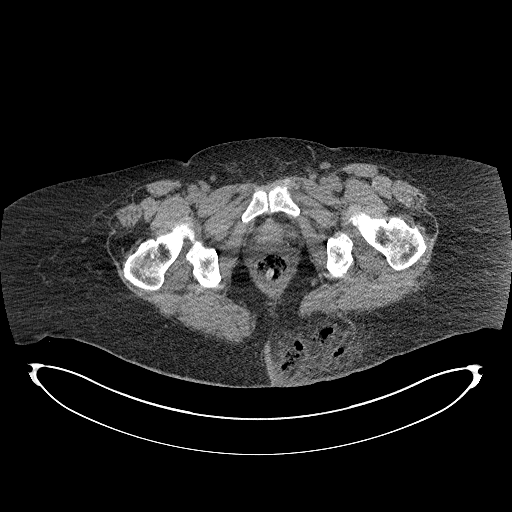
[im 76/147  soft-tissue]
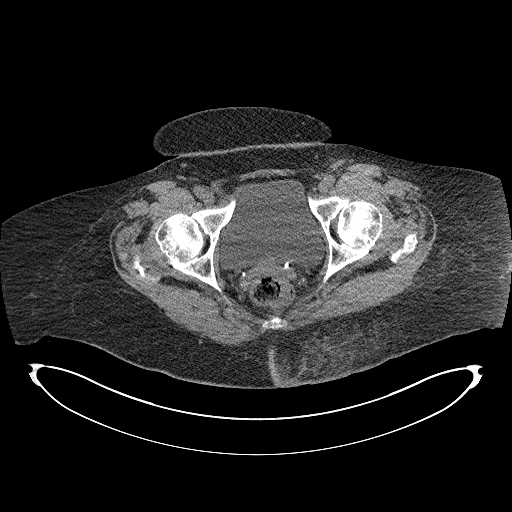
[im 85/147  soft-tissue]
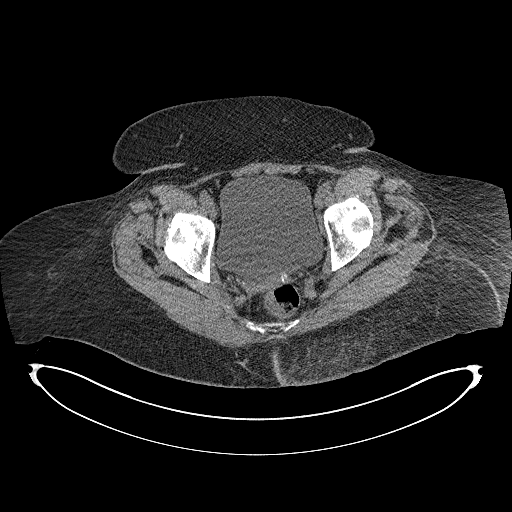
[im 95/147  soft-tissue]
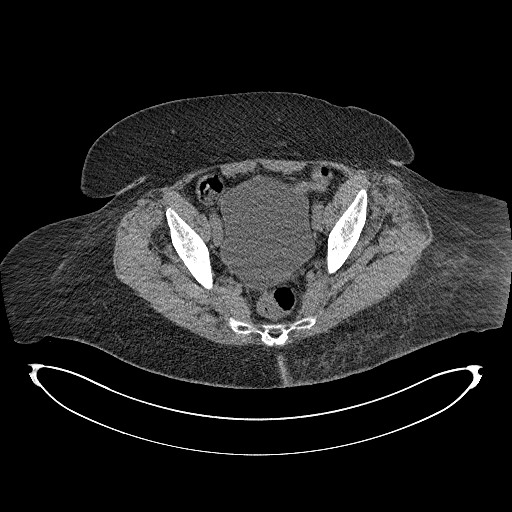
[im 95/147  bone]
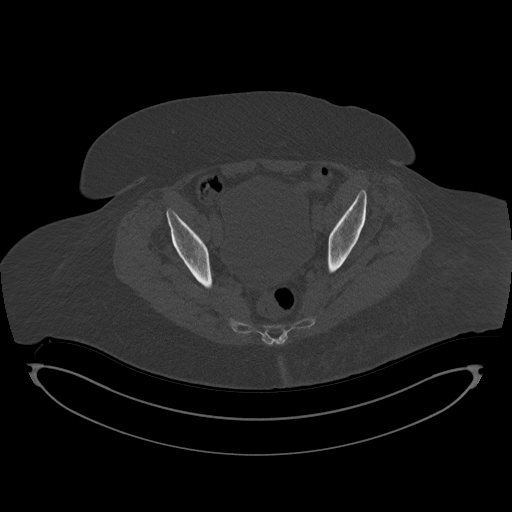
[im 104/147  soft-tissue]
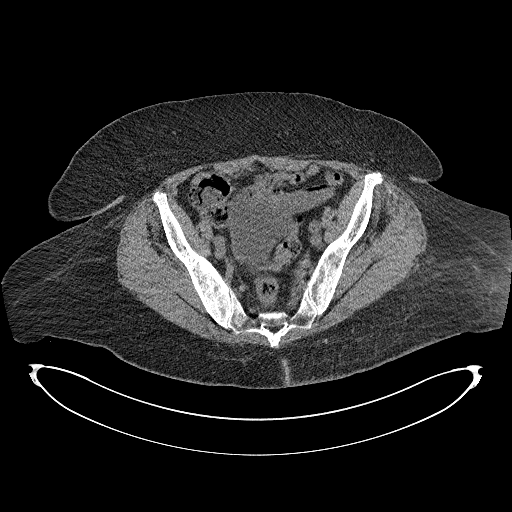
[im 118/147  soft-tissue]
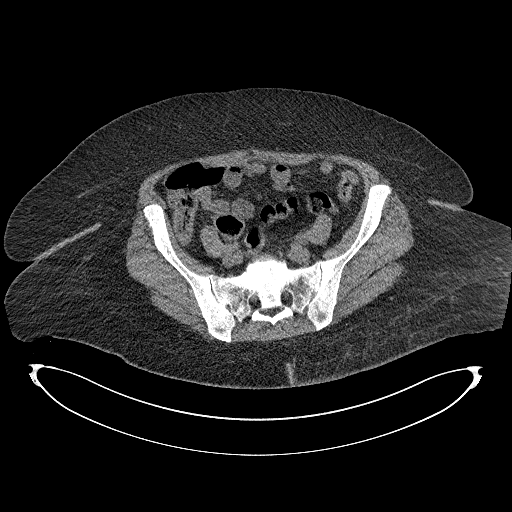
[im 128/147  soft-tissue]
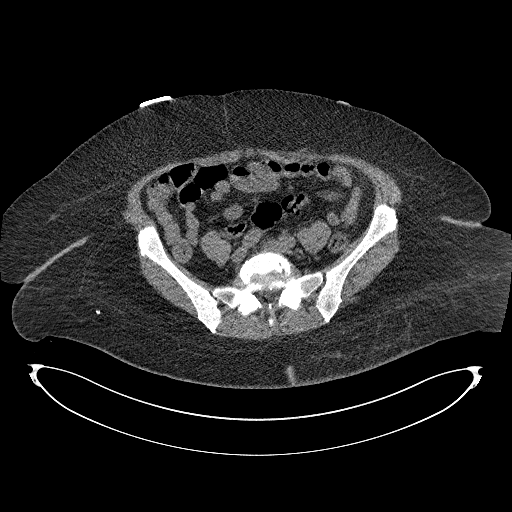
[im 137/147  soft-tissue]
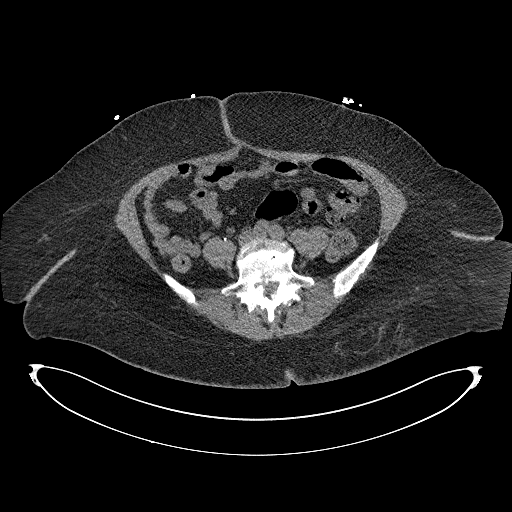

[Series 10: coronal st · coronal · 0.59mm/px · 3 of 133 slices shown]
[im 45/133  soft-tissue]
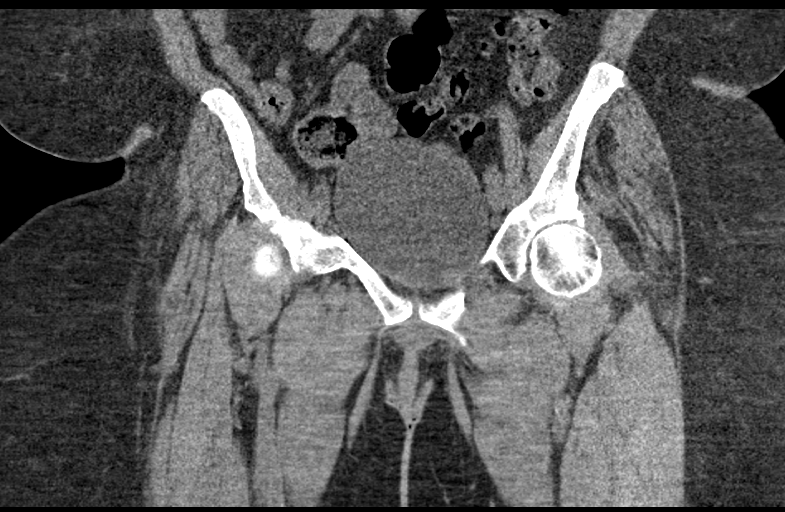
[im 59/133  soft-tissue]
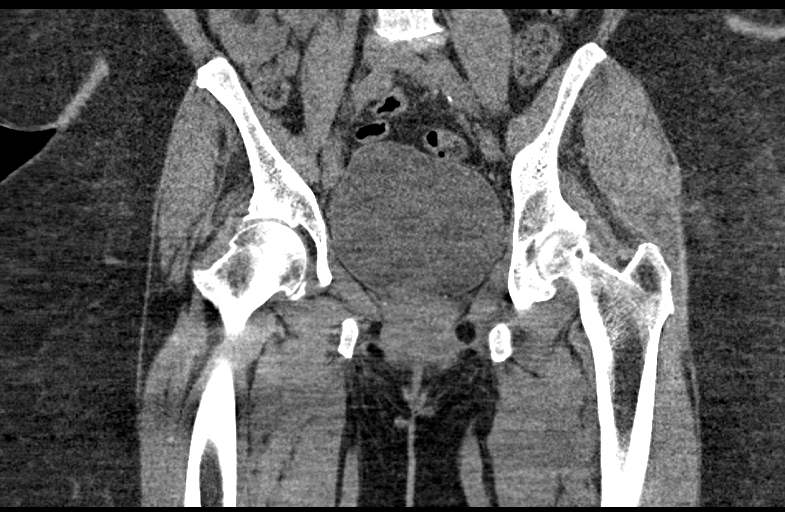
[im 74/133  soft-tissue]
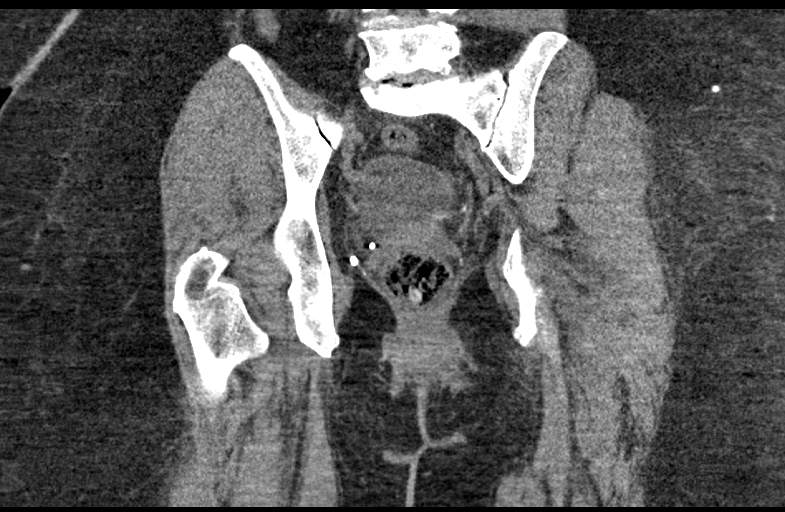

[16 of 46 positions shown; findings below may reference images not displayed]

FINDINGS: Urinary Tract:  Bladder is moderately distended.

Bowel: No small bowel or colonic dilatation within the visualized
pelvis.

Vascular/Lymphatic: Atherosclerotic calcification noted distal aorta
and common iliac arteries.

Reproductive: The uterus is surgically absent. There is no adnexal
mass.

Other:  No intraperitoneal free fluid.

Musculoskeletal: No worrisome lytic or sclerotic osseous
abnormality. 7.1 x 9.6 x 11.1 cm area of ill-defined, infiltrating
edema/inflammation is identified in the medial left buttock region,
adjacent to the intergluteal fold. There is gas in the fat,
scattered through the area of edema. No focal or rim enhancing fluid
collection evident.
IMPRESSION: Edema and gas identified in the subcutaneous fat of the medial left
gluteal region without associated abscess. Imaging features
compatible with soft tissue infection/cellulitis.

## 2020-10-31 ENCOUNTER — Telehealth: Payer: Self-pay | Admitting: Family Medicine

## 2020-10-31 NOTE — Progress Notes (Signed)
  Chronic Care Management   Note  10/31/2020 Name: Freya Zobrist MRN: [id number] DOB: 02/02/1941  Uva Runkel is a 80 y.o. year old female who is a primary care patient of Pickard, Cammie Mcgee, MD. I reached out to [employer] by phone today in response to a referral sent by Ms. Shamiracle Totaro's PCP, Susy Frizzle, MD.   Ms. Verley was given information about Chronic Care Management services today including:  1. CCM service includes personalized support from designated clinical staff supervised by her physician, including individualized plan of care and coordination with other care providers 2. 24/7 contact phone numbers for assistance for urgent and routine care needs. 3. Service will only be billed when office clinical staff spend 20 minutes or more in a month to coordinate care. 4. Only one practitioner may furnish and bill the service in a calendar month. 5. The patient may stop CCM services at any time (effective at the end of the month) by phone call to the office staff.   Patient agreed to services and verbal consent obtained.   Follow up plan:   Carley Perdue [employer] Scheduler

## 2020-11-14 DIAGNOSIS — K219 Gastro-esophageal reflux disease without esophagitis: Secondary | ICD-10-CM | POA: Diagnosis not present

## 2020-11-14 DIAGNOSIS — K5900 Constipation, unspecified: Secondary | ICD-10-CM | POA: Diagnosis not present

## 2020-11-21 DIAGNOSIS — N189 Chronic kidney disease, unspecified: Secondary | ICD-10-CM | POA: Diagnosis not present

## 2020-11-21 DIAGNOSIS — E1122 Type 2 diabetes mellitus with diabetic chronic kidney disease: Secondary | ICD-10-CM | POA: Diagnosis not present

## 2020-11-21 DIAGNOSIS — N185 Chronic kidney disease, stage 5: Secondary | ICD-10-CM | POA: Diagnosis not present

## 2020-11-21 DIAGNOSIS — E872 Acidosis: Secondary | ICD-10-CM | POA: Diagnosis not present

## 2020-11-21 DIAGNOSIS — N2581 Secondary hyperparathyroidism of renal origin: Secondary | ICD-10-CM | POA: Diagnosis not present

## 2020-11-21 DIAGNOSIS — D631 Anemia in chronic kidney disease: Secondary | ICD-10-CM | POA: Diagnosis not present

## 2020-11-21 DIAGNOSIS — I120 Hypertensive chronic kidney disease with stage 5 chronic kidney disease or end stage renal disease: Secondary | ICD-10-CM | POA: Diagnosis not present

## 2020-12-09 ENCOUNTER — Other Ambulatory Visit: Payer: Self-pay | Admitting: Family Medicine

## 2020-12-17 ENCOUNTER — Telehealth: Payer: Self-pay | Admitting: Pharmacist

## 2020-12-17 NOTE — Progress Notes (Addendum)
Chronic Care Management Pharmacy Assistant   Name: Emily Hughes  MRN: [id number] DOB: [birth year]  Emily Hughes is an 80 y.o. year old female who presents for his initial CCM visit with the clinical pharmacist.  Reason for Encounter: Chart Prep    Conditions to be addressed/monitored: HTN, CAD, Afib, Type 2 DM, CKD.  Primary concerns for visit include: HTN, Type 2 DM.  Recent office visits:  06/30/20 Dr. Dennard Schaumann For Abscess. STARTED Doxycycline Hyclate 100 mg 2 times daily for 10 days.  Recent consult visits:  07/29/20 Caroline Sauger, Herbie [place] For GERD. No information given.  Hospital visits:  None in previous 6 months  Medications: Outpatient Encounter Medications as of 12/17/2020  Medication Sig   acetaminophen (TYLENOL) 325 MG tablet Take 2 tablets (650 mg total) by mouth every 6 (six) hours as needed for mild pain (or Fever >/= 101).   aspirin 81 MG tablet Take 81 mg by mouth daily.   B-D ULTRAFINE III SHORT PEN 31G X 8 MM MISC USE WITH LANTUS.   calcitRIOL (ROCALTROL) 0.25 MCG capsule Take 0.25 mcg by mouth daily.   cloNIDine (CATAPRES) 0.1 MG tablet Take 0.1 mg by mouth 2 (two) times daily.    colchicine 0.6 MG tablet TAKE ONE TABLET BY MOUTH DAILY.   collagenase (SANTYL) ointment Apply topically daily.   cyclobenzaprine (FLEXERIL) 5 MG tablet Take 1 tablet (5 mg total) by mouth 3 (three) times daily as needed for muscle spasms.   diltiazem (CARDIZEM CD) 300 MG 24 hr capsule Take 1 capsule (300 mg total) by mouth daily.   doxycycline (VIBRA-TABS) 100 MG tablet Take 1 tablet (100 mg total) by mouth 2 (two) times daily.   febuxostat (ULORIC) 40 MG tablet Take 1 tablet (40 mg total) by mouth daily.   furosemide (LASIX) 40 MG tablet TAKE 2 TABLETS IN THE MORNING AND 1 TABLET IN THE EVENING.   insulin aspart (NOVOLOG) 100 UNIT/ML injection Inject 8 Units into the skin 3 (three) times daily with meals. (Patient taking differently: Inject 10 Units into the skin once.)    insulin glargine (LANTUS) 100 UNIT/ML injection Inject 0.32 mLs (32 Units total) into the skin daily.   levothyroxine (SYNTHROID) 50 MCG tablet TAKE 1 TABLET BY MOUTH  DAILY   metoprolol succinate (TOPROL-XL) 50 MG 24 hr tablet Take 1 tablet (50 mg total) by mouth daily. Take with or immediately following a meal.   ondansetron (ZOFRAN) 4 MG tablet Take 1 tablet (4 mg total) by mouth every 6 (six) hours as needed for nausea.   oxyCODONE (OXY IR/ROXICODONE) 5 MG immediate release tablet Take 1 tablet (5 mg total) by mouth every 6 (six) hours as needed for moderate pain.   pantoprazole (PROTONIX) 40 MG tablet Take 1 tablet (40 mg total) by mouth daily at 6 (six) AM.   polyethylene glycol (MIRALAX / GLYCOLAX) 17 g packet Take 17 g by mouth daily.   pravastatin (PRAVACHOL) 80 MG tablet Take 1 tablet (80 mg total) by mouth at bedtime.   senna (SENOKOT) 8.6 MG TABS tablet Take 2 tablets (17.2 mg total) by mouth at bedtime.   sodium bicarbonate 650 MG tablet Take 1 tablet (650 mg total) by mouth 4 (four) times daily.   sucralfate (CARAFATE) 1 G tablet Take 1 tablet (1 g total) by mouth 4 (four) times daily -  with meals and at bedtime.   VELTASSA 8.4 g packet Take 8.4 g by mouth daily.    No facility-administered encounter medications on  file as of 12/17/2020.    Have you seen any other providers since your last visit? Patient stated no.  Any changes in your medications or health? Patient stated no.  Any side effects from any medications? Patient stated no.  Do you have an symptoms or problems not managed by your medications? Patient stated no.  Any concerns about your health right now? Patient stated no.  Has your provider asked that you check blood pressure, blood sugar, or follow special diet at home? Patient stated she is to stay away from salt.   Do you get any type of exercise on a regular basis? Patient stated no.  Can you think of a goal you would like to reach for your health? Patient  stated to loose weight.  Do you have any problems getting your medications? Patient stated no.  Is there anything that you would like to discuss during the appointment? Patient stated no.  Please bring medications and supplements to appointment, patient reminded of her OTP 12/19/20 at 9 am.   Follow-Up:Pharmacist Review   Charlann Lange, [place] Pharmacist Assistant [phone]

## 2020-12-19 ENCOUNTER — Ambulatory Visit (INDEPENDENT_AMBULATORY_CARE_PROVIDER_SITE_OTHER): Payer: Medicare Other | Admitting: Pharmacist

## 2020-12-19 DIAGNOSIS — I10 Essential (primary) hypertension: Secondary | ICD-10-CM | POA: Diagnosis not present

## 2020-12-19 DIAGNOSIS — N186 End stage renal disease: Secondary | ICD-10-CM

## 2020-12-19 DIAGNOSIS — E1122 Type 2 diabetes mellitus with diabetic chronic kidney disease: Secondary | ICD-10-CM

## 2020-12-19 DIAGNOSIS — E7800 Pure hypercholesterolemia, unspecified: Secondary | ICD-10-CM | POA: Diagnosis not present

## 2020-12-19 DIAGNOSIS — I4891 Unspecified atrial fibrillation: Secondary | ICD-10-CM

## 2020-12-19 NOTE — Patient Instructions (Addendum)
Visit Information   Goals Addressed             This Visit's Progress    Track and Manage My Blood Pressure-Hypertension       Timeframe:  Long-Range Goal Priority:  High Start Date: 12/19/20                            Expected End Date:  06/20/21                     Follow Up Date 04/03/21    - check blood pressure 3 times per week - choose a place to take my blood pressure (home, clinic or office, retail store) - write blood pressure results in a log or diary    Why is this important?   You won't feel high blood pressure, but it can still hurt your blood vessels.  High blood pressure can cause heart or kidney problems. It can also cause a stroke.  Making lifestyle changes like losing a little weight or eating less salt will help.  Checking your blood pressure at home and at different times of the day can help to control blood pressure.  If the doctor prescribes medicine remember to take it the way the doctor ordered.  Call the office if you cannot afford the medicine or if there are questions about it.     Notes:         Patient Care Plan: General Pharmacy (Adult)     Problem Identified: HTN, CAD, Afib, GERD, Type II DM w/CKD, HLD   Priority: High  Onset Date: 12/19/2020     Long-Range Goal: Patient-Specific Goal   Start Date: 12/19/2020  Expected End Date: 06/20/2021  This Visit's Progress: On track  Priority: High  Note:   Current Barriers:  None specified at this time.  Pharmacist Clinical Goal(s):  Patient will verbalize ability to afford treatment regimen maintain control of glucose and BP as evidenced by home monitoring and regular screenings  through collaboration with PharmD and provider.   Interventions: 1:1 collaboration with Susy Frizzle, MD regarding development and update of comprehensive plan of care as evidenced by provider attestation and co-signature Inter-disciplinary care team collaboration (see longitudinal plan of  care) Comprehensive medication review performed; medication list updated in electronic medical record  Hypertension (BP goal <130/80) -Controlled -Current treatment: Clonidine 0.1mg  BID Cardizem 300mg  24 hr capsule daily Metoprolol XL 50mg  daily -Medications previously tried: amlodipine, valsartan, ACEIs (allergy unspecified)  -Current home readings: 130/70 -Current dietary habits: see DM -Current exercise habits: minimal -Denies hypotensive/hypertensive symptoms -Educated on BP goals and benefits of medications for prevention of heart attack, stroke and kidney damage; Daily salt intake goal < 2300 mg; Importance of home blood pressure monitoring; Symptoms of hypotension and importance of maintaining adequate hydration; -Counseled to monitor BP at home daily, document, and provide log at future appointments -Counseled on diet and exercise extensively Recommended to continue current medication  Hyperlipidemia: (LDL goal < 70) -Controlled -Current treatment: Pravastatin 80mg  daily -Medications previously tried: none noted  -Current dietary patterns: see DM -Current exercise habits: none -Educated on Cholesterol goals;  Benefits of statin for ASCVD risk reduction; Importance of limiting foods high in cholesterol; -Most recent LDL slightly above goal, however acceptable for patient -Reports 100% adherence and tolerates with no concerns -Reminded patient to schedule appointment for physical and updated labs in August with Dr. Dennard Schaumann -Recommended to continue current medication  Diabetes (A1c goal <7%) -Controlled -Current medications: Novolog 10 units only at lunch time Lantus 30 units daily - AM -Medications previously tried: glimepiride  -Current home glucose readings fasting glucose: 120-135 post prandial glucose: unknown -Reports hypoglycemic/hyperglycemic symptoms, very infrequently at night she will wake up with a low blood sugar in the 50s-60s.  This is because she  "has not done what she was supposed to do."  Her low BG only happens when she does not eat.  She is now only taking meal time insulin with lunch since this is her biggest meal of the day. -Current meal patterns:  breakfast: grits/oatmeal  lunch: salad/sandwich  dinner: leftovers from lunch snacks:  drinks: water, lemonade -Current exercise: minimal -Educated on A1c and blood sugar goals; Prevention and management of hypoglycemic episodes; Benefits of routine self-monitoring of blood sugar; Importance of eating regular meals on insulin to avoid hypoglycemia -Counseled to check feet daily and get yearly eye exams -Counseled on diet and exercise extensively Recommended to continue current medication  Atrial Fibrillation (Goal: prevent stroke and major bleeding) -Controlled  -Current treatment: Rate control: Metoprolol XL 50mg  Anticoagulation: None -Medications previously tried: none noted -Home BP and HR readings: 130/70, unsure of pulse  -Counseled on increased risk of stroke due to Afib and benefits of anticoagulation for stroke prevention; importance of regular laboratory monitoring; -Recommended to continue current medication  GERD (Goal: Control symptoms) -Not ideally controlled -Current treatment  Pantoprazole 40mg  daily Sucralfate 1g qid prn -Medications previously tried: none noted -She reports some symptoms of acid reflux. -She already elevates the head of her bed and takes above medications correctly. -We discussed working on trigger foods and limiting the amount of these we eat.  -Recommended to continue current medication  Patient Goals/Self-Care Activities Patient will:  - take medications as prescribed check blood pressure at least a few times per week, document, and provide at future appointments weigh daily, and contact provider if weight gain of 3 lbs in one day or 5 lbs in a week collaborate with provider on medication access solutions  Follow Up Plan: The  care management team will reach out to the patient again over the next 120 days.       Ms. Schoof was given information about Chronic Care Management services today including:  CCM service includes personalized support from designated clinical staff supervised by her physician, including individualized plan of care and coordination with other care providers 24/7 contact phone numbers for assistance for urgent and routine care needs. Standard insurance, coinsurance, copays and deductibles apply for chronic care management only during months in which we provide at least 20 minutes of these services. Most insurances cover these services at 100%, however patients may be responsible for any copay, coinsurance and/or deductible if applicable. This service may help you avoid the need for more expensive face-to-face services. Only one practitioner may furnish and bill the service in a calendar month. The patient may stop CCM services at any time (effective at the end of the month) by phone call to the office staff.  Patient agreed to services and verbal consent obtained.   The patient verbalized understanding of instructions, educational materials, and care plan provided today and agreed to receive a mailed copy of patient instructions, educational materials, and care plan.  Telephone follow up appointment with pharmacy team member scheduled for: 4 months  Edythe Clarity, [place]

## 2020-12-19 NOTE — Progress Notes (Signed)
Chronic Care Management Pharmacy Note  12/19/2020 Name:  Emily Hughes MRN:  [id number] DOB:  08/24/1940  Summary: Initial visit with PharmD.  Complete medication review.  Patient doing well overall did mention some episodes of hypoglycemia on days where she may not have consistently eaten.  Recommendations/Changes made from today's visit: No medication changes  Plan: FU 4 months PAP application for Lantus initiated   Subjective: Emily Hughes is an 80 y.o. year old female who is a primary patient of Pickard, Cammie Mcgee, MD.  The CCM team was consulted for assistance with disease management and care coordination needs.    Engaged with patient by telephone for initial visit in response to provider referral for pharmacy case management and/or care coordination services.   Consent to Services:  The patient was given the following information about Chronic Care Management services today, agreed to services, and gave verbal consent: 1. CCM service includes personalized support from designated clinical staff supervised by the primary care provider, including individualized plan of care and coordination with other care providers 2. 24/7 contact phone numbers for assistance for urgent and routine care needs. 3. Service will only be billed when office clinical staff spend 20 minutes or more in a month to coordinate care. 4. Only one practitioner may furnish and bill the service in a calendar month. 5.The patient may stop CCM services at any time (effective at the end of the month) by phone call to the office staff. 6. The patient will be responsible for cost sharing (co-pay) of up to 20% of the service fee (after annual deductible is met). Patient agreed to services and consent obtained.  Patient Care Team: Susy Frizzle, MD as PCP - General (Family Medicine) Edythe Clarity, [facility] as Pharmacist (Pharmacist)  Recent office visits:  06/30/20 Dr. Dennard Schaumann For Abscess. STARTED Doxycycline  Hyclate 100 mg 2 times daily for 10 days.   Recent consult visits:  07/29/20 Caroline Sauger, Herbie [place] For GERD. No information given.   Hospital visits:  None in previous 6 months   Objective:  Lab Results  Component Value Date   CREATININE 4.13 (H) 02/07/2020   BUN 68 (H) 02/07/2020   GFRNONAA 10 (L) 02/07/2020   GFRAA 11 (L) 02/07/2020   NA 140 02/07/2020   K 5.1 02/07/2020   CALCIUM 9.9 02/07/2020   CO2 24 02/07/2020   GLUCOSE 110 (H) 02/07/2020    Lab Results  Component Value Date/Time   HGBA1C 6.7 (H) 02/07/2020 08:31 AM   HGBA1C 7.4 (H) 01/23/2019 12:35 PM   MICROALBUR 0.6 05/09/2017 09:08 AM   MICROALBUR 1.4 04/08/2014 08:36 AM    Last diabetic Eye exam:  Lab Results  Component Value Date/Time   HMDIABEYEEXA No Retinopathy 02/10/2018 12:00 AM    Last diabetic Foot exam: No results found for: HMDIABFOOTEX   Lab Results  Component Value Date   CHOL 141 02/07/2020   HDL 38 (L) 02/07/2020   LDLCALC 74 02/07/2020   TRIG 197 (H) 02/07/2020   CHOLHDL 3.7 02/07/2020    Hepatic Function Latest Ref Rng & Units 02/07/2020 01/23/2019 11/27/2018  Total Protein 6.1 - 8.1 g/dL 7.2 6.7 5.8(L)  Albumin 3.5 - 5.0 g/dL - - 1.8(L)  AST 10 - 35 U/L 16 11 96(H)  ALT 6 - 29 U/L 13 10 82(H)  Alk Phosphatase 38 - 126 U/L - - 71  Total Bilirubin 0.2 - 1.2 mg/dL 0.5 0.4 <0.1(L)    Lab Results  Component Value Date/Time   TSH  3.12 02/07/2020 08:31 AM   TSH 1.815 11/09/2018 03:44 AM   TSH 3.13 05/09/2017 09:08 AM    CBC Latest Ref Rng & Units 02/07/2020 01/23/2019 11/27/2018  WBC 3.8 - 10.8 Thousand/uL 6.5 6.6 9.3  Hemoglobin 11.7 - 15.5 g/dL 9.9(L) 8.5(L) 8.2(L)  Hematocrit 35.0 - 45.0 % 31.3(L) 27.0(L) 25.4(L)  Platelets 140 - 400 Thousand/uL 264 319 359    No results found for: VD25OH  Clinical ASCVD: No  The 10-year ASCVD risk score Mikey Bussing DC Jr., et al., 2013) is: 32.6%   Values used to calculate the score:     Age: 73 years     Sex: Female     Is Non-Hispanic African  American: Yes     Diabetic: Yes     Tobacco smoker: No     Systolic Blood Pressure: 570 mmHg     Is BP treated: Yes     HDL Cholesterol: 38 mg/dL     Total Cholesterol: 141 mg/dL    No flowsheet data found.    Social History   Tobacco Use  Smoking Status Former   Pack years: 0.00  Smokeless Tobacco Never  Tobacco Comments   quit smoking in 2000   BP Readings from Last 3 Encounters:  06/30/20 (!) 120/58  02/07/20 (!) 120/50  01/23/19 140/60   Pulse Readings from Last 3 Encounters:  06/30/20 90  02/07/20 73  01/23/19 (!) 110   Wt Readings from Last 3 Encounters:  02/07/20 230 lb (104.3 kg)  01/23/19 220 lb (99.8 kg)  11/28/18 282 lb (127.9 kg)   BMI Readings from Last 3 Encounters:  02/07/20 40.74 kg/m  01/23/19 38.97 kg/m  11/28/18 49.95 kg/m    Assessment/Interventions: Review of patient past medical history, allergies, medications, health status, including review of consultants reports, laboratory and other test data, was performed as part of comprehensive evaluation and provision of chronic care management services.   SDOH:  (Social Determinants of Health) assessments and interventions performed: Yes  Financial Resource Strain: Low Risk    Difficulty of Paying Living Expenses: Not very hard    SDOH Screenings   Alcohol Screen: Not on file  Depression (PHQ2-9): Not on file  Financial Resource Strain: Low Risk    Difficulty of Paying Living Expenses: Not very hard  Food Insecurity: Not on file  Housing: Not on file  Physical Activity: Not on file  Social Connections: Not on file  Stress: Not on file  Tobacco Use: Not on file  Transportation Needs: Not on file    [place]  Allergies  Allergen Reactions   5-Alpha Reductase Inhibitors    Ace Inhibitors    Actos [Pioglitazone Hydrochloride]    Dust Mite Extract    Dye Fdc Red [Erythrosine Red No. 3]    Fish Allergy     Medications Reviewed Today     Reviewed by Edythe Clarity, [facility]  (Pharmacist) on 12/19/20 at [place] List Status: <None>   Medication Order Taking? Sig Documenting Provider Last Dose Status Informant  acetaminophen (TYLENOL) 325 MG tablet [id number] Yes Take 2 tablets (650 mg total) by mouth every 6 (six) hours as needed for mild pain (or Fever >/= 101). Georgette Shell, MD Taking Active   aspirin 81 MG tablet [id number] Yes Take 81 mg by mouth daily. [provider] Taking Active Self  B-D ULTRAFINE III SHORT PEN 31G X 8 MM MISC [id number] Yes USE WITH LANTUS. Susy Frizzle, MD Taking Active Self  calcitRIOL (ROCALTROL) 0.25 MCG capsule [id number]  Take 0.25 mcg by mouth daily. [provider]  Active Self  cloNIDine (CATAPRES) 0.1 MG tablet [id number] Yes Take 0.1 mg by mouth 2 (two) times daily.  [provider] Taking Active   colchicine 0.6 MG tablet [id number] Yes TAKE ONE TABLET BY MOUTH DAILY. Susy Frizzle, MD Taking Active   collagenase Annitta Needs) ointment [id number] Yes Apply topically daily. Georgette Shell, MD Taking Active   cyclobenzaprine (FLEXERIL) 5 MG tablet [id number] Yes Take 1 tablet (5 mg total) by mouth 3 (three) times daily as needed for muscle spasms. Susy Frizzle, MD Taking Active   diltiazem (CARDIZEM CD) 300 MG 24 hr capsule [id number] Yes Take 1 capsule (300 mg total) by mouth daily. [place], Modena Nunnery, MD Taking Active   febuxostat (ULORIC) 40 MG tablet [id number] Yes Take 1 tablet (40 mg total) by mouth daily. Georgette Shell, MD Taking Active   furosemide (LASIX) 40 MG tablet [id number] Yes TAKE 2 TABLETS IN THE MORNING AND 1 TABLET IN THE EVENING. Susy Frizzle, MD Taking Active   insulin aspart (NOVOLOG) 100 UNIT/ML injection [id number] Yes Inject 8 Units into the skin 3 (three) times daily with meals.  Patient taking differently: Inject 10 Units into the skin once.   Georgette Shell, MD Taking Active            Med Note ([place]   Fri Dec 19, 2020  9:09 AM) Patient  using 10 units only at lunch   insulin glargine (LANTUS) 100 UNIT/ML injection [id number] Yes Inject 0.32 mLs (32 Units total) into the skin daily. Susy Frizzle, MD Taking Active   levothyroxine (SYNTHROID) 50 MCG tablet [id number] Yes TAKE 1 TABLET BY MOUTH  DAILY Susy Frizzle, MD Taking Active   metoprolol succinate (TOPROL-XL) 50 MG 24 hr tablet [id number] Yes Take 1 tablet (50 mg total) by mouth daily. Take with or immediately following a meal. Susy Frizzle, MD Taking Active   oxyCODONE (OXY IR/ROXICODONE) 5 MG immediate release tablet [id number] Yes Take 1 tablet (5 mg total) by mouth every 6 (six) hours as needed for moderate pain. Georgette Shell, MD Taking Active   pantoprazole (PROTONIX) 40 MG tablet [id number] Yes Take 1 tablet (40 mg total) by mouth daily at 6 (six) AM. Georgette Shell, MD Taking Active   polyethylene glycol (MIRALAX / GLYCOLAX) 17 g packet [id number] Yes Take 17 g by mouth daily. Georgette Shell, MD Taking Active   pravastatin (PRAVACHOL) 80 MG tablet [id number] Yes Take 1 tablet (80 mg total) by mouth at bedtime. Susy Frizzle, MD Taking Active   senna (SENOKOT) 8.6 MG TABS tablet [id number] Yes Take 2 tablets (17.2 mg total) by mouth at bedtime. Georgette Shell, MD Taking Active   sodium bicarbonate 650 MG tablet [id number] Yes Take 1 tablet (650 mg total) by mouth 4 (four) times daily. Susy Frizzle, MD Taking Active Self  sucralfate (CARAFATE) 1 G tablet [id number] Yes Take 1 tablet (1 g total) by mouth 4 (four) times daily -  with meals and at bedtime. Susy Frizzle, MD Taking Active Self  VELTASSA 8.4 g packet [id number] Yes Take 8.4 g by mouth daily.  [provider] Taking Active Self            Patient Active Problem List   Diagnosis Date Noted   Acute idiopathic gout of multiple sites    Severe protein-calorie malnutrition ([place])  Acute urinary retention 11/18/2018   Acute renal failure superimposed on stage  4 chronic kidney disease ([place]) 16/04/9603   Acute metabolic encephalopathy 54/03/8118   Cellulitis and abscess of buttock    Abnormal LFTs    Abscess of left buttock 11/08/2018   Sepsis ([place]) 11/08/2018   Atrial fibrillation with RVR ([place]) 11/08/2018   Elevated troponin 11/08/2018   Convulsions ([place]) 07/24/2015   Type 2 diabetes mellitus with ketoacidosis without coma ([place]) 07/24/2015   Leukocytosis    Chronic kidney disease (CKD), stage IV (severe) ([place]) 06/13/2015   Seizures ([place]) 06/12/2015   DKA (diabetic ketoacidoses) 06/12/2015   Seizure ([place]) 06/12/2015   High cholesterol    CAD (coronary artery disease)    Proteinuria    GERD (gastroesophageal reflux disease)    DYSLIPIDEMIA 02/27/2010   OBESITY, MORBID 02/27/2010   DM 02/24/2010   RHEUMATIC FEVER 02/24/2010   Essential hypertension 02/24/2010   RENAL INSUFFICIENCY, CHRONIC 02/24/2010    Immunization History  Administered Date(s) Administered   Influenza Whole 06/28/2005, 04/28/2010   Influenza, High Dose Seasonal PF 03/30/2017, 04/12/2018   Influenza,inj,Quad PF,6+ Mos 03/27/2013, 04/08/2014, 03/27/2015, 03/30/2016   Pneumococcal Conjugate-13 04/08/2014   Pneumococcal Polysaccharide-23 02/04/2011, 11/17/2018   Zoster, Live 04/17/2013    Conditions to be addressed/monitored:  HTN, CAD, Afib, GERD, Type II DM w/CKD, HLD  Care Plan : General Pharmacy (Adult)  Updates made by Edythe Clarity, RPH since 12/19/2020 12:00 AM     Problem: HTN, CAD, Afib, GERD, Type II DM w/CKD, HLD   Priority: High  Onset Date: 12/19/2020     Long-Range Goal: Patient-Specific Goal   Start Date: 12/19/2020  Expected End Date: 06/20/2021  This Visit's Progress: On track  Priority: High  Note:   Current Barriers:  None specified at this time.  Pharmacist Clinical Goal(s):  Patient will verbalize ability to afford treatment regimen maintain control of glucose and BP as evidenced by home monitoring and regular screenings   through collaboration with PharmD and provider.   Interventions: 1:1 collaboration with Susy Frizzle, MD regarding development and update of comprehensive plan of care as evidenced by provider attestation and co-signature Inter-disciplinary care team collaboration (see longitudinal plan of care) Comprehensive medication review performed; medication list updated in electronic medical record  Hypertension (BP goal <130/80) -Controlled -Current treatment: Clonidine 0.58m BID Cardizem 309m24 hr capsule daily Metoprolol XL 5057maily -Medications previously tried: amlodipine, valsartan, ACEIs (allergy unspecified)  -Current home readings: 130/70 -Current dietary habits: see DM -Current exercise habits: minimal -Denies hypotensive/hypertensive symptoms -Educated on BP goals and benefits of medications for prevention of heart attack, stroke and kidney damage; Daily salt intake goal < 2300 mg; Importance of home blood pressure monitoring; Symptoms of hypotension and importance of maintaining adequate hydration; -Counseled to monitor BP at home daily, document, and provide log at future appointments -Counseled on diet and exercise extensively Recommended to continue current medication  Hyperlipidemia: (LDL goal < 70) -Controlled -Current treatment: Pravastatin 68m34mily -Medications previously tried: none noted  -Current dietary patterns: see DM -Current exercise habits: none -Educated on Cholesterol goals;  Benefits of statin for ASCVD risk reduction; Importance of limiting foods high in cholesterol; -Most recent LDL slightly above goal, however acceptable for patient -Reports 100% adherence and tolerates with no concerns -Reminded patient to schedule appointment for physical and updated labs in August with Dr. PickDennard Schaumanncommended to continue current medication  Diabetes (A1c goal <7%) -Controlled -Current medications: Novolog 10 units only at lunch time Lantus  30 units  daily - AM -Medications previously tried: glimepiride  -Current home glucose readings fasting glucose: 120-135 post prandial glucose: unknown -Reports hypoglycemic/hyperglycemic symptoms, very infrequently at night she will wake up with a low blood sugar in the 50s-60s.  This is because she "has not done what she was supposed to do."  Her low BG only happens when she does not eat.  She is now only taking meal time insulin with lunch since this is her biggest meal of the day. -Current meal patterns:  breakfast: grits/oatmeal  lunch: salad/sandwich  dinner: leftovers from lunch snacks:  drinks: water, lemonade -Current exercise: minimal -Educated on A1c and blood sugar goals; Prevention and management of hypoglycemic episodes; Benefits of routine self-monitoring of blood sugar; Importance of eating regular meals on insulin to avoid hypoglycemia -Counseled to check feet daily and get yearly eye exams -Counseled on diet and exercise extensively Recommended to continue current medication  Atrial Fibrillation (Goal: prevent stroke and major bleeding) -Controlled  -Current treatment: Rate control: Metoprolol XL 72m Anticoagulation: None -Medications previously tried: none noted -Home BP and HR readings: 130/70, unsure of pulse  -Counseled on increased risk of stroke due to Afib and benefits of anticoagulation for stroke prevention; importance of regular laboratory monitoring; -Recommended to continue current medication  GERD (Goal: Control symptoms) -Not ideally controlled -Current treatment  Pantoprazole 430mdaily Sucralfate 1g qid prn -Medications previously tried: none noted -She reports some symptoms of acid reflux. -She already elevates the head of her bed and takes above medications correctly. -We discussed working on trigger foods and limiting the amount of these we eat.  -Recommended to continue current medication  Patient Goals/Self-Care Activities Patient will:  -  take medications as prescribed check blood pressure at least a few times per week, document, and provide at future appointments weigh daily, and contact provider if weight gain of 3 lbs in one day or 5 lbs in a week collaborate with provider on medication access solutions  Follow Up Plan: The care management team will reach out to the patient again over the next 120 days.        Medication Assistance: Application for Lantus  medication assistance program. in process.  Anticipated assistance start date unknown.  See plan of care for additional detail.  Compliance/Adherence/Medication fill history: Care Gaps: Needs diabetic eye exam  Star-Rating Drugs: N/A  Patient's preferred pharmacy is:  [place]: [phone]: [phone]one: [phone]: [phone]  ([place]McKe[place]burg[place]ake [place] [address]wbaSuite [address]hone: [phone]: [phone] pill box? Yes Pt endorses 100% compliance  We discussed: Benefits of medication synchronization, packaging and delivery as well as enhanced pharmacist oversight with [employer]. Patient decided to: Continue current medication management strategy  Care Plan and Follow Up Patient Decision:  Patient agrees to Care Plan and Follow-up.  Plan: The care management team will reach out to the patient again over the next 120 days.  ChBeverly MilchPharmD Clinical Pharmacist [phone]

## 2020-12-25 ENCOUNTER — Telehealth: Payer: Self-pay | Admitting: Pharmacist

## 2020-12-25 NOTE — Progress Notes (Addendum)
Chronic Care Management Pharmacy Assistant   Name: Jaimie Pippins  MRN: [id number] DOB: 1940-10-23  Reason for Encounter: PAP  Medications: Outpatient Encounter Medications as of 12/25/2020  Medication Sig Note   acetaminophen (TYLENOL) 325 MG tablet Take 2 tablets (650 mg total) by mouth every 6 (six) hours as needed for mild pain (or Fever >/= 101).    aspirin 81 MG tablet Take 81 mg by mouth daily.    B-D ULTRAFINE III SHORT PEN 31G X 8 MM MISC USE WITH LANTUS.    calcitRIOL (ROCALTROL) 0.25 MCG capsule Take 0.25 mcg by mouth daily.    cloNIDine (CATAPRES) 0.1 MG tablet Take 0.1 mg by mouth 2 (two) times daily.     colchicine 0.6 MG tablet TAKE ONE TABLET BY MOUTH DAILY.    collagenase (SANTYL) ointment Apply topically daily.    cyclobenzaprine (FLEXERIL) 5 MG tablet Take 1 tablet (5 mg total) by mouth 3 (three) times daily as needed for muscle spasms.    diltiazem (CARDIZEM CD) 300 MG 24 hr capsule Take 1 capsule (300 mg total) by mouth daily.    febuxostat (ULORIC) 40 MG tablet Take 1 tablet (40 mg total) by mouth daily.    furosemide (LASIX) 40 MG tablet TAKE 2 TABLETS IN THE MORNING AND 1 TABLET IN THE EVENING.    insulin aspart (NOVOLOG) 100 UNIT/ML injection Inject 8 Units into the skin 3 (three) times daily with meals. (Patient taking differently: Inject 10 Units into the skin once.) 12/19/2020: Patient using 10 units only at lunch    insulin glargine (LANTUS) 100 UNIT/ML injection Inject 0.32 mLs (32 Units total) into the skin daily.    levothyroxine (SYNTHROID) 50 MCG tablet TAKE 1 TABLET BY MOUTH  DAILY    metoprolol succinate (TOPROL-XL) 50 MG 24 hr tablet Take 1 tablet (50 mg total) by mouth daily. Take with or immediately following a meal.    oxyCODONE (OXY IR/ROXICODONE) 5 MG immediate release tablet Take 1 tablet (5 mg total) by mouth every 6 (six) hours as needed for moderate pain.    pantoprazole (PROTONIX) 40 MG tablet Take 1 tablet (40 mg total) by mouth daily at 6  (six) AM.    polyethylene glycol (MIRALAX / GLYCOLAX) 17 g packet Take 17 g by mouth daily.    pravastatin (PRAVACHOL) 80 MG tablet Take 1 tablet (80 mg total) by mouth at bedtime.    senna (SENOKOT) 8.6 MG TABS tablet Take 2 tablets (17.2 mg total) by mouth at bedtime.    sodium bicarbonate 650 MG tablet Take 1 tablet (650 mg total) by mouth 4 (four) times daily.    sucralfate (CARAFATE) 1 G tablet Take 1 tablet (1 g total) by mouth 4 (four) times daily -  with meals and at bedtime.    VELTASSA 8.4 g packet Take 8.4 g by mouth daily.     No facility-administered encounter medications on file as of 12/25/2020.    New patient assistance application form filled out to [employer] for Lantus. Waiting for patient and provider to complete and sign documentation. Called patient to inquire if they wanted the application mailed to them or if they wanted to come into the office. Patient is required to sign application and to bring/have proof of income. She stated she would be willing to come into office to bring proof of income and sign application once she receives her paperwork in the mail she would like it mailed to their residence address [place]   [id number].   Follow-Up:Pharmacist Review   Charlann Lange, [place] Pharmacist Assistant [phone]

## 2020-12-31 ENCOUNTER — Telehealth: Payer: Self-pay | Admitting: Pharmacist

## 2020-12-31 NOTE — Progress Notes (Addendum)
    Chronic Care Management Pharmacy Assistant   Name: Callaway Hardigree  MRN: [id number] DOB: 1941-04-22  Reason for Encounter: Adherence Review  Reviewed the patients chart for any medical/health and/or medication changes there were not any at this time.   Follow-Up:Pharmacist Review  Charlann Lange, [place] Pharmacist Assistant [phone]

## 2021-01-18 ENCOUNTER — Other Ambulatory Visit: Payer: Self-pay | Admitting: Family Medicine

## 2021-01-21 DIAGNOSIS — N189 Chronic kidney disease, unspecified: Secondary | ICD-10-CM | POA: Diagnosis not present

## 2021-01-21 DIAGNOSIS — K219 Gastro-esophageal reflux disease without esophagitis: Secondary | ICD-10-CM | POA: Diagnosis not present

## 2021-01-21 DIAGNOSIS — D631 Anemia in chronic kidney disease: Secondary | ICD-10-CM | POA: Diagnosis not present

## 2021-01-23 ENCOUNTER — Encounter: Payer: Self-pay | Admitting: Family Medicine

## 2021-01-23 ENCOUNTER — Ambulatory Visit (INDEPENDENT_AMBULATORY_CARE_PROVIDER_SITE_OTHER): Payer: Medicare Other | Admitting: Family Medicine

## 2021-01-23 VITALS — BP 154/78 | HR 90 | Temp 98.3°F | Resp 16 | Ht 63.0 in | Wt 220.0 lb

## 2021-01-23 DIAGNOSIS — N186 End stage renal disease: Secondary | ICD-10-CM | POA: Diagnosis not present

## 2021-01-23 DIAGNOSIS — D509 Iron deficiency anemia, unspecified: Secondary | ICD-10-CM

## 2021-01-23 DIAGNOSIS — E039 Hypothyroidism, unspecified: Secondary | ICD-10-CM

## 2021-01-23 DIAGNOSIS — E1122 Type 2 diabetes mellitus with diabetic chronic kidney disease: Secondary | ICD-10-CM | POA: Diagnosis not present

## 2021-01-23 DIAGNOSIS — N184 Chronic kidney disease, stage 4 (severe): Secondary | ICD-10-CM | POA: Diagnosis not present

## 2021-01-23 DIAGNOSIS — I10 Essential (primary) hypertension: Secondary | ICD-10-CM

## 2021-01-23 MED ORDER — IRON (FERROUS SULFATE) 325 (65 FE) MG PO TABS: 325.0000 mg | ORAL_TABLET | Freq: Two times a day (BID) | ORAL | 2 refills | Status: DC

## 2021-01-23 NOTE — Progress Notes (Signed)
Subjective:    Patient ID: Emily Hughes, female    DOB: 16-Feb-1941, 80 y.o.   MRN: [id number]  HPI I have not seen the patient in quite some time.  She states that she needs some iron.  When I asked her to be more specific, the patient states that she is extremely tired.  She has very little energy.  She feels the craving to eat ice.  She states that when she felt like this in the past she had low iron.  She states that she is just extremely weak and tired.  She denies any chest pain.  She denies any shortness of breath.  She denies any palpitations.  She denies any melena or hematochezia or hematuria.  She denies any dysuria.  She denies any abdominal pain.  She denies any fevers or chills.  She denies any weight loss or muscle aches or night sweats.  She is checking her blood sugars and states that they are running between 120 and 140 in the mornings and she denies seeing any blood sugar greater than 200. Past Medical History:  Diagnosis Date   Anemia    CAD (coronary artery disease)    Colon polyps    CRI (chronic renal insufficiency)    DDD (degenerative disc disease)    Diabetes mellitus    INSULIN DEPENDENT   GERD (gastroesophageal reflux disease)    High cholesterol    Hypertension    Pancreatitis    Proteinuria    Past Surgical History:  Procedure Laterality Date   ABDOMINAL HYSTERECTOMY     ANGIOPLASTY     ANKLE SURGERY Right    BACK SURGERY     INCISION AND DRAINAGE ABSCESS N/A 11/09/2018   Procedure: INCISION AND DRAINAGE ABSCESS;  Surgeon: Coralie Keens, MD;  Location: [facility];  Service: General;  Laterality: N/A;   TONSILLECTOMY     Current Outpatient Medications on File Prior to Visit  Medication Sig Dispense Refill   acetaminophen (TYLENOL) 325 MG tablet Take 2 tablets (650 mg total) by mouth every 6 (six) hours as needed for mild pain (or Fever >/= 101).     aspirin 81 MG tablet Take 81 mg by mouth daily.     B-D ULTRAFINE III SHORT PEN 31G X 8 MM MISC USE  WITH LANTUS. 100 each 0   calcitRIOL (ROCALTROL) 0.25 MCG capsule Take 0.25 mcg by mouth daily.     cloNIDine (CATAPRES) 0.1 MG tablet Take 0.1 mg by mouth 2 (two) times daily.      colchicine 0.6 MG tablet TAKE ONE TABLET BY MOUTH DAILY. 30 tablet 0   collagenase (SANTYL) ointment Apply topically daily. 15 g 0   cyclobenzaprine (FLEXERIL) 5 MG tablet Take 1 tablet (5 mg total) by mouth 3 (three) times daily as needed for muscle spasms. 30 tablet 1   diltiazem (CARDIZEM CD) 300 MG 24 hr capsule Take 1 capsule (300 mg total) by mouth daily. 30 capsule 0   febuxostat (ULORIC) 40 MG tablet Take 1 tablet (40 mg total) by mouth daily. 30 tablet 0   furosemide (LASIX) 40 MG tablet TAKE 2 TABLETS IN THE MORNING AND 1 TABLET IN THE EVENING. 270 tablet 1   insulin aspart (NOVOLOG) 100 UNIT/ML injection Inject 8 Units into the skin 3 (three) times daily with meals. (Patient taking differently: Inject 10 Units into the skin once.) 10 mL 11   insulin glargine (LANTUS) 100 UNIT/ML injection Inject 0.32 mLs (32 Units total) into the skin  daily. 10 mL 11   levothyroxine (SYNTHROID) 50 MCG tablet TAKE 1 TABLET BY MOUTH  DAILY 90 tablet 3   metoprolol succinate (TOPROL-XL) 50 MG 24 hr tablet TAKE 1 TABLET BY MOUTH  DAILY WITH OR IMMEDIATELY  FOLLOWING A MEAL 90 tablet 3   oxyCODONE (OXY IR/ROXICODONE) 5 MG immediate release tablet Take 1 tablet (5 mg total) by mouth every 6 (six) hours as needed for moderate pain. 30 tablet 0   pantoprazole (PROTONIX) 40 MG tablet Take 1 tablet (40 mg total) by mouth daily at 6 (six) AM. 30 tablet 0   polyethylene glycol (MIRALAX / GLYCOLAX) 17 g packet Take 17 g by mouth daily. 14 each 0   pravastatin (PRAVACHOL) 80 MG tablet Take 1 tablet (80 mg total) by mouth at bedtime. 90 tablet 1   senna (SENOKOT) 8.6 MG TABS tablet Take 2 tablets (17.2 mg total) by mouth at bedtime. 120 each 0   sodium bicarbonate 650 MG tablet Take 1 tablet (650 mg total) by mouth 4 (four) times daily. 360  tablet 1   sucralfate (CARAFATE) 1 G tablet Take 1 tablet (1 g total) by mouth 4 (four) times daily -  with meals and at bedtime. 120 tablet 0   VELTASSA 8.4 g packet Take 8.4 g by mouth daily.      No current facility-administered medications on file prior to visit.   Allergies  Allergen Reactions   5-Alpha Reductase Inhibitors    Ace Inhibitors    Actos [Pioglitazone Hydrochloride]    Dust Mite Extract    Dye Fdc Red [Erythrosine Red No. 3]    Fish Allergy    Social History   Socioeconomic History   Marital status: Widowed    Spouse name: Not on file   Number of children: Not on file   Years of education: Not on file   Highest education level: Not on file  Occupational History   Occupation: Retired  Tobacco Use   Smoking status: Former   Smokeless tobacco: Never   Tobacco comments:    quit smoking in 2000  Substance and Sexual Activity   Alcohol use: No    Alcohol/week: 0.0 standard drinks   Drug use: No   Sexual activity: Not on file  Other Topics Concern   Not on file  Social History Narrative   Not on file   Social Determinants of Health   Financial Resource Strain: Low Risk    Difficulty of Paying Living Expenses: Not very hard  Food Insecurity: Not on file  Transportation Needs: Not on file  Physical Activity: Not on file  Stress: Not on file  Social Connections: Not on file  Intimate Partner Violence: Not on file      Review of Systems     Objective:   Physical Exam Constitutional:      General: She is not in acute distress.    Appearance: She is obese. She is not ill-appearing or toxic-appearing.  Cardiovascular:     Rate and Rhythm: Normal rate and regular rhythm.     Heart sounds: Normal heart sounds. No murmur heard. Pulmonary:     Effort: Pulmonary effort is normal.     Breath sounds: Normal breath sounds. No wheezing, rhonchi or rales.  Abdominal:     General: Bowel sounds are normal. There is no distension.     Palpations: Abdomen  is soft.     Tenderness: There is no abdominal tenderness. There is no guarding or rebound.  Hernia: No hernia is present.  Musculoskeletal:     Right lower leg: No edema.     Left lower leg: No edema.  Neurological:     Mental Status: She is alert.          Assessment & Plan:  Essential hypertension  Type II diabetes mellitus with end-stage renal disease ([place]) - Plan: COMPLETE METABOLIC PANEL WITH GFR, Hemoglobin A1c  Chronic kidney disease (CKD), stage IV (severe) ([facility]) - Plan: COMPLETE METABOLIC PANEL WITH GFR  Hypothyroidism, unspecified type - Plan: TSH  Iron deficiency anemia, unspecified iron deficiency anemia type - Plan: CBC with Differential/Platelet, Fecal occult blood, imunochemical, Fecal occult blood, imunochemical, Fecal occult blood, imunochemical, Iron, TIBC and Ferritin Panel  Certainly at risk for anemia.  Recommended checking an iron panel, stool for blood x3, CMP, TSH, and an A1c to assess her glycemic control.  If the patient is anemic with iron deficiency, we will try oral replacement with ferrous sulfate first or we can consider iron infusions if she cannot tolerate oral therapy however we need to ensure that her thyroid medication is dosed properly and that her electrolytes and glycemic control are stable

## 2021-01-24 LAB — COMPLETE METABOLIC PANEL WITH GFR
AG Ratio: 1.6 (calc) (ref 1.0–2.5)
ALT: 12 U/L (ref 6–29)
AST: 18 U/L (ref 10–35)
Albumin: 4.4 g/dL (ref 3.6–5.1)
Alkaline phosphatase (APISO): 162 U/L — ABNORMAL HIGH (ref 37–153)
BUN/Creatinine Ratio: 13 (calc) (ref 6–22)
BUN: 61 mg/dL — ABNORMAL HIGH (ref 7–25)
CO2: 23 mmol/L (ref 20–32)
Calcium: 9.7 mg/dL (ref 8.6–10.4)
Chloride: 105 mmol/L (ref 98–110)
Creat: 4.72 mg/dL — ABNORMAL HIGH (ref 0.60–1.00)
Globulin: 2.8 g/dL (calc) (ref 1.9–3.7)
Glucose, Bld: 163 mg/dL — ABNORMAL HIGH (ref 65–99)
Potassium: 4.4 mmol/L (ref 3.5–5.3)
Sodium: 141 mmol/L (ref 135–146)
Total Bilirubin: 0.4 mg/dL (ref 0.2–1.2)
Total Protein: 7.2 g/dL (ref 6.1–8.1)
eGFR: 9 mL/min/{1.73_m2} — ABNORMAL LOW (ref >=60)

## 2021-01-24 LAB — CBC WITH DIFFERENTIAL/PLATELET
Absolute Monocytes: 665 cells/uL (ref 200–950)
Basophils Absolute: 28 cells/uL (ref 0–200)
Basophils Relative: 0.4 %
Eosinophils Absolute: 371 cells/uL (ref 15–500)
Eosinophils Relative: 5.3 %
HCT: 29.6 % — ABNORMAL LOW (ref 35.0–45.0)
Hemoglobin: 9.4 g/dL — ABNORMAL LOW (ref 11.7–15.5)
Lymphs Abs: 2723 cells/uL (ref 850–3900)
MCH: 27 pg (ref 27.0–33.0)
MCHC: 31.8 g/dL — ABNORMAL LOW (ref 32.0–36.0)
MCV: 85.1 fL (ref 80.0–100.0)
MPV: 9.8 fL (ref 7.5–12.5)
Monocytes Relative: 9.5 %
Neutro Abs: 3213 cells/uL (ref 1500–7800)
Neutrophils Relative %: 45.9 %
Platelets: 293 10*3/uL (ref 140–400)
RBC: 3.48 10*6/uL — ABNORMAL LOW (ref 3.80–5.10)
RDW: 14.6 % (ref 11.0–15.0)
Total Lymphocyte: 38.9 %
WBC: 7 10*3/uL (ref 3.8–10.8)

## 2021-01-24 LAB — IRON,TIBC AND FERRITIN PANEL
%SAT: 18 % (calc) (ref 16–45)
Ferritin: 209 ng/mL (ref 16–288)
Iron: 44 ug/dL — ABNORMAL LOW (ref 45–160)
TIBC: 239 mcg/dL (calc) — ABNORMAL LOW (ref 250–450)

## 2021-01-24 LAB — HEMOGLOBIN A1C
Hgb A1c MFr Bld: 6.4 % of total Hgb — ABNORMAL HIGH (ref <5.7)
Mean Plasma Glucose: 137 mg/dL
eAG (mmol/L): 7.6 mmol/L

## 2021-01-24 LAB — TSH: TSH: 2.8 mIU/L (ref 0.40–4.50)

## 2021-02-11 ENCOUNTER — Telehealth: Payer: Self-pay | Admitting: Pharmacist

## 2021-02-11 NOTE — Progress Notes (Addendum)
Chronic Care Management Pharmacy Assistant   Name: Emily Hughes  MRN: [id number] DOB: July 01, 1941  Reason for Encounter: Disease State For DM.    Conditions to be addressed/monitored: HTN, CAD, Afib, GERD, Type II DM w/CKD, HLD   Recent office visits:  01/23/21 Dr. Dennard Schaumann For weakness. STARTED Ferrous Sulfate 325 mg 2 times daily.   Recent consult visits:  None since 12/31/20  Hospital visits:  None since 12/31/20  Medications: Outpatient Encounter Medications as of 02/11/2021  Medication Sig Note   acetaminophen (TYLENOL) 325 MG tablet Take 2 tablets (650 mg total) by mouth every 6 (six) hours as needed for mild pain (or Fever >/= 101).    aspirin 81 MG tablet Take 81 mg by mouth daily.    B-D ULTRAFINE III SHORT PEN 31G X 8 MM MISC USE WITH LANTUS.    calcitRIOL (ROCALTROL) 0.25 MCG capsule Take 0.25 mcg by mouth daily.    cloNIDine (CATAPRES) 0.1 MG tablet Take 0.1 mg by mouth 2 (two) times daily.     colchicine 0.6 MG tablet TAKE ONE TABLET BY MOUTH DAILY.    collagenase (SANTYL) ointment Apply topically daily.    cyclobenzaprine (FLEXERIL) 5 MG tablet Take 1 tablet (5 mg total) by mouth 3 (three) times daily as needed for muscle spasms.    diltiazem (CARDIZEM CD) 300 MG 24 hr capsule Take 1 capsule (300 mg total) by mouth daily.    febuxostat (ULORIC) 40 MG tablet Take 1 tablet (40 mg total) by mouth daily.    furosemide (LASIX) 40 MG tablet TAKE 2 TABLETS IN THE MORNING AND 1 TABLET IN THE EVENING.    insulin aspart (NOVOLOG) 100 UNIT/ML injection Inject 8 Units into the skin 3 (three) times daily with meals. (Patient taking differently: Inject 10 Units into the skin once.) 12/19/2020: Patient using 10 units only at lunch    insulin glargine (LANTUS) 100 UNIT/ML injection Inject 0.32 mLs (32 Units total) into the skin daily.    Iron, Ferrous Sulfate, 325 (65 Fe) MG TABS Take 325 mg by mouth 2 (two) times daily.    levothyroxine (SYNTHROID) 50 MCG tablet TAKE 1 TABLET BY  MOUTH  DAILY    metoprolol succinate (TOPROL-XL) 50 MG 24 hr tablet TAKE 1 TABLET BY MOUTH  DAILY WITH OR IMMEDIATELY  FOLLOWING A MEAL    oxyCODONE (OXY IR/ROXICODONE) 5 MG immediate release tablet Take 1 tablet (5 mg total) by mouth every 6 (six) hours as needed for moderate pain.    pantoprazole (PROTONIX) 40 MG tablet Take 1 tablet (40 mg total) by mouth daily at 6 (six) AM.    polyethylene glycol (MIRALAX / GLYCOLAX) 17 g packet Take 17 g by mouth daily.    pravastatin (PRAVACHOL) 80 MG tablet Take 1 tablet (80 mg total) by mouth at bedtime.    senna (SENOKOT) 8.6 MG TABS tablet Take 2 tablets (17.2 mg total) by mouth at bedtime.    sodium bicarbonate 650 MG tablet Take 1 tablet (650 mg total) by mouth 4 (four) times daily.    sucralfate (CARAFATE) 1 G tablet Take 1 tablet (1 g total) by mouth 4 (four) times daily -  with meals and at bedtime.    VELTASSA 8.4 g packet Take 8.4 g by mouth daily.     No facility-administered encounter medications on file as of 02/11/2021.    Recent Relevant Labs: Lab Results  Component Value Date/Time   HGBA1C 6.4 (H) 01/23/2021 12:35 PM   HGBA1C 6.7 (  H) 02/07/2020 08:31 AM   MICROALBUR 0.6 05/09/2017 09:08 AM   MICROALBUR 1.4 04/08/2014 08:36 AM    Kidney Function Lab Results  Component Value Date/Time   CREATININE 4.72 (H) 01/23/2021 12:35 PM   CREATININE 4.13 (H) 02/07/2020 08:31 AM   GFRNONAA 10 (L) 02/07/2020 08:31 AM   GFRAA 11 (L) 02/07/2020 08:31 AM    Current antihyperglycemic regimen:  Novolog 10 units only at lunch time Lantus 30 units daily - AM  What recent interventions/DTPs have been made to improve glycemic control:  None.   Have there been any recent hospitalizations or ED visits since last visit with CPP? Patient stated no.  Patient denies hypoglycemic symptoms, including None  Patient denies hyperglycemic symptoms, including none  How often are you checking your blood sugar? Patient stated once daily  What are your  blood sugars ranging?  Patient stated the highest her blood sugar as been is 135,   During the week, how often does your blood glucose drop below 70? Patient stated Never  Are you checking your feet daily/regularly?  Patient stated she checks her feet daily.   Adherence Review: Is the patient currently on a STATIN medication? Pravastatin 80 mg  Is the patient currently on ACE/ARB medication? N/A.  Does the patient have >5 day gap between last estimated fill dates? Per misc rpts, yes.  Star Rating Drugs: Pravastatin 80 mg 90 DS 08/08/20.   Follow-Up:Pharmacist Review  Charlann Lange, RMA Clinical Pharmacist Assistant [phone]  10 minutes spent in review, coordination, and documentation.  Reviewed by: Beverly Milch, PharmD Clinical Pharmacist [phone]

## 2021-03-10 ENCOUNTER — Other Ambulatory Visit: Payer: Self-pay | Admitting: Family Medicine

## 2021-03-10 ENCOUNTER — Telehealth: Payer: Self-pay | Admitting: *Deleted

## 2021-03-10 MED ORDER — HYDROCODONE BIT-HOMATROP MBR 5-1.5 MG/5ML PO SOLN: 5.0000 mL | Freq: Three times a day (TID) | ORAL | 0 refills | Status: DC | PRN

## 2021-03-10 MED ORDER — MOLNUPIRAVIR EUA 200MG CAPSULE: 4.0000 | ORAL_CAPSULE | Freq: Two times a day (BID) | ORAL | 0 refills | Status: AC

## 2021-03-10 NOTE — Telephone Encounter (Signed)
Patient tested positive for COVID on 03/06/2021.   Sx began 03/05/2021 and include productive cough, fatigue, dizzy, loss of appetite, fever, chills.   Advised to continue symptom management with OTC medications: Tylenol/ Ibuprofen for fever/ body aches, Mucinex/ Delsym for cough/ chest congestion, Afrin/Sudafed/nasal saline for sinus pressure/ nasal congestion.  If chest pain, shortness of breath, fever >104 that is unresponsive to antipyretics noted, or if unable to tolerate fluids, advised to go to ER for evaluation.   Advised that the CDC recommends the following criteria prior to ending isolation in vaccinated and unvaccinated persons: at least 5 days since symptoms onset, then an additional 5 days of masking  AND 3 days fever free without antipyretics (Tylenol or Ibuprofen) AND improvement in respiratory symptoms.  Order for [facility] sent to retail pharmacy.   Also inquired about prescription cough medication. PCP sent prescription to pharmacy.

## 2021-03-24 ENCOUNTER — Other Ambulatory Visit: Payer: Self-pay | Admitting: Family Medicine

## 2021-03-26 ENCOUNTER — Other Ambulatory Visit: Payer: Self-pay

## 2021-03-26 ENCOUNTER — Ambulatory Visit (INDEPENDENT_AMBULATORY_CARE_PROVIDER_SITE_OTHER): Payer: Medicare Other

## 2021-03-26 VITALS — HR 73 | Temp 98.0°F | Ht 63.0 in | Wt 220.0 lb

## 2021-03-26 DIAGNOSIS — R2681 Unsteadiness on feet: Secondary | ICD-10-CM | POA: Diagnosis not present

## 2021-03-26 DIAGNOSIS — M25561 Pain in right knee: Secondary | ICD-10-CM

## 2021-03-26 DIAGNOSIS — Z0000 Encounter for general adult medical examination without abnormal findings: Secondary | ICD-10-CM | POA: Diagnosis not present

## 2021-03-26 DIAGNOSIS — M25562 Pain in left knee: Secondary | ICD-10-CM

## 2021-03-26 DIAGNOSIS — M545 Low back pain, unspecified: Secondary | ICD-10-CM | POA: Diagnosis not present

## 2021-03-26 DIAGNOSIS — G8929 Other chronic pain: Secondary | ICD-10-CM

## 2021-03-26 NOTE — Patient Instructions (Signed)
Ms. Emily Hughes , Thank you for taking time to come for your Medicare Wellness Visit. I appreciate your ongoing commitment to your health goals. Please review the following plan we discussed and let me know if I can assist you in the future.   Screening recommendations/referrals: Colonoscopy: No longer required due to age.  Mammogram: No longer required due to age.  Bone Density: Due, call at your convience to schedule. Recommended yearly ophthalmology/optometry visit for glaucoma screening and checkup Recommended yearly dental visit for hygiene and checkup  Vaccinations: Influenza vaccine: 03/26/2021 Pneumococcal vaccine: Done 04/08/2014 & 11/17/2018 Tdap vaccine: Repeat in 10 years  Shingles vaccine: Shingrix discussed. Please contact your pharmacy for coverage information.     Covid-19:Done 08/28/19 09/18/19 07/03/20  Advanced directives: Advance directive discussed with you today. I have provided a copy for you to complete at home and have notarized. Once this is complete please bring a copy in to our office so we can scan it into your chart.   Conditions/risks identified: Aim for 30 minutes of exercise or brisk walking each day, drink 6-8 glasses of water and eat lots of fruits and vegetables.   Next appointment: Follow up in one year for your annual wellness visit 2023.   Preventive Care 11 Years and Older, Female Preventive care refers to lifestyle choices and visits with your health care provider that can promote health and wellness. What does preventive care include? A yearly physical exam. This is also called an annual well check. Dental exams once or twice a year. Routine eye exams. Ask your health care provider how often you should have your eyes checked. Personal lifestyle choices, including: Daily care of your teeth and gums. Regular physical activity. Eating a healthy diet. Avoiding tobacco and drug use. Limiting alcohol use. Practicing safe sex. Taking low-dose aspirin  every day. Taking vitamin and mineral supplements as recommended by your health care provider. What happens during an annual well check? The services and screenings done by your health care provider during your annual well check will depend on your age, overall health, lifestyle risk factors, and family history of disease. Counseling  Your health care provider may ask you questions about your: Alcohol use. Tobacco use. Drug use. Emotional well-being. Home and relationship well-being. Sexual activity. Eating habits. History of falls. Memory and ability to understand (cognition). Work and work Statistician. Reproductive health. Screening  You may have the following tests or measurements: Height, weight, and BMI. Blood pressure. Lipid and cholesterol levels. These may be checked every 5 years, or more frequently if you are over 23 years old. Skin check. Lung cancer screening. You may have this screening every year starting at age 10 if you have a 30-pack-year history of smoking and currently smoke or have quit within the past 15 years. Fecal occult blood test (FOBT) of the stool. You may have this test every year starting at age 51. Flexible sigmoidoscopy or colonoscopy. You may have a sigmoidoscopy every 5 years or a colonoscopy every 10 years starting at age 43. Hepatitis C blood test. Hepatitis B blood test. Sexually transmitted disease (STD) testing. Diabetes screening. This is done by checking your blood sugar (glucose) after you have not eaten for a while (fasting). You may have this done every 1-3 years. Bone density scan. This is done to screen for osteoporosis. You may have this done starting at age 89. Mammogram. This may be done every 1-2 years. Talk to your health care provider about how often you should have regular  mammograms. Talk with your health care provider about your test results, treatment options, and if necessary, the need for more tests. Vaccines  Your health  care provider may recommend certain vaccines, such as: Influenza vaccine. This is recommended every year. Tetanus, diphtheria, and acellular pertussis (Tdap, Td) vaccine. You may need a Td booster every 10 years. Zoster vaccine. You may need this after age 54. Pneumococcal 13-valent conjugate (PCV13) vaccine. One dose is recommended after age 74. Pneumococcal polysaccharide (PPSV23) vaccine. One dose is recommended after age 67. Talk to your health care provider about which screenings and vaccines you need and how often you need them. This information is not intended to replace advice given to you by your health care provider. Make sure you discuss any questions you have with your health care provider. Document Released: 07/18/2015 Document Revised: 03/10/2016 Document Reviewed: 04/22/2015 Elsevier Interactive Patient Education  2017 [place] Prevention in the Home Falls can cause injuries. They can happen to people of all ages. There are many things you can do to make your home safe and to help prevent falls. What can I do on the outside of my home? Regularly fix the edges of walkways and driveways and fix any cracks. Remove anything that might make you trip as you walk through a door, such as a raised step or threshold. Trim any bushes or trees on the path to your home. Use bright outdoor lighting. Clear any walking paths of anything that might make someone trip, such as rocks or tools. Regularly check to see if handrails are loose or broken. Make sure that both sides of any steps have handrails. Any raised decks and porches should have guardrails on the edges. Have any leaves, snow, or ice cleared regularly. Use sand or salt on walking paths during winter. Clean up any spills in your garage right away. This includes oil or grease spills. What can I do in the bathroom? Use night lights. Install grab bars by the toilet and in the tub and shower. Do not use towel bars as grab  bars. Use non-skid mats or decals in the tub or shower. If you need to sit down in the shower, use a plastic, non-slip stool. Keep the floor dry. Clean up any water that spills on the floor as soon as it happens. Remove soap buildup in the tub or shower regularly. Attach bath mats securely with double-sided non-slip rug tape. Do not have throw rugs and other things on the floor that can make you trip. What can I do in the bedroom? Use night lights. Make sure that you have a light by your bed that is easy to reach. Do not use any sheets or blankets that are too big for your bed. They should not hang down onto the floor. Have a firm chair that has side arms. You can use this for support while you get dressed. Do not have throw rugs and other things on the floor that can make you trip. What can I do in the kitchen? Clean up any spills right away. Avoid walking on wet floors. Keep items that you use a lot in easy-to-reach places. If you need to reach something above you, use a strong step stool that has a grab bar. Keep electrical cords out of the way. Do not use floor polish or wax that makes floors slippery. If you must use wax, use non-skid floor wax. Do not have throw rugs and other things on the floor that can  make you trip. What can I do with my stairs? Do not leave any items on the stairs. Make sure that there are handrails on both sides of the stairs and use them. Fix handrails that are broken or loose. Make sure that handrails are as long as the stairways. Check any carpeting to make sure that it is firmly attached to the stairs. Fix any carpet that is loose or worn. Avoid having throw rugs at the top or bottom of the stairs. If you do have throw rugs, attach them to the floor with carpet tape. Make sure that you have a light switch at the top of the stairs and the bottom of the stairs. If you do not have them, ask someone to add them for you. What else can I do to help prevent  falls? Wear shoes that: Do not have high heels. Have rubber bottoms. Are comfortable and fit you well. Are closed at the toe. Do not wear sandals. If you use a stepladder: Make sure that it is fully opened. Do not climb a closed stepladder. Make sure that both sides of the stepladder are locked into place. Ask someone to hold it for you, if possible. Clearly mark and make sure that you can see: Any grab bars or handrails. First and last steps. Where the edge of each step is. Use tools that help you move around (mobility aids) if they are needed. These include Canes. Walkers. Scooters. Crutches. Turn on the lights when you go into a dark area. Replace any light bulbs as soon as they burn out. Set up your furniture so you have a clear path. Avoid moving your furniture around. If any of your floors are uneven, fix them. If there are any pets around you, be aware of where they are. Review your medicines with your doctor. Some medicines can make you feel dizzy. This can increase your chance of falling. Ask your doctor what other things that you can do to help prevent falls. This information is not intended to replace advice given to you by your health care provider. Make sure you discuss any questions you have with your health care provider. Document Released: 04/17/2009 Document Revised: 11/27/2015 Document Reviewed: 07/26/2014 [employer] Interactive Patient Education  2017 [employer] :

## 2021-03-26 NOTE — Progress Notes (Signed)
Subjective:   Emily Hughes is a 80 y.o. female who presents for an Initial Medicare Annual Wellness Visit.  Review of Systems     Cardiac Risk Factors include: diabetes mellitus;hypertension;dyslipidemia;obesity (BMI >30kg/m2);sedentary lifestyle     Objective:    Today's Vitals   03/26/21 1153 03/26/21 1157  Pulse: 73   Temp: 98 F (36.7 C)   SpO2: 98%   Weight: 220 lb (99.8 kg)   Height: 5\' 3"  (1.6 m)   PainSc:  3    Body mass index is 38.97 kg/m.  Advanced Directives 03/26/2021 11/09/2018 11/08/2018 11/08/2018 11/13/2016 03/15/2016 06/12/2015  Does Patient Have a Medical Advance Directive? No - No No No No No  Would patient like information on creating a medical advance directive? No - Patient declined No - Patient declined - - - Yes - Scientist, clinical (histocompatibility and immunogenetics) given -    Current Medications (verified) Outpatient Encounter Medications as of 03/26/2021  Medication Sig   acetaminophen (TYLENOL) 325 MG tablet Take 2 tablets (650 mg total) by mouth every 6 (six) hours as needed for mild pain (or Fever >/= 101).   aspirin 81 MG tablet Take 81 mg by mouth daily.   B-D ULTRAFINE III SHORT PEN 31G X 8 MM MISC USE WITH LANTUS.   calcitRIOL (ROCALTROL) 0.25 MCG capsule Take 0.25 mcg by mouth daily.   cloNIDine (CATAPRES) 0.1 MG tablet Take 0.1 mg by mouth 2 (two) times daily.    colchicine 0.6 MG tablet TAKE ONE TABLET BY MOUTH DAILY.   collagenase (SANTYL) ointment Apply topically daily.   cyclobenzaprine (FLEXERIL) 5 MG tablet Take 1 tablet (5 mg total) by mouth 3 (three) times daily as needed for muscle spasms.   diltiazem (CARDIZEM CD) 300 MG 24 hr capsule Take 1 capsule (300 mg total) by mouth daily.   febuxostat (ULORIC) 40 MG tablet Take 1 tablet (40 mg total) by mouth daily.   furosemide (LASIX) 40 MG tablet TAKE 2 TABLETS IN THE MORNING AND 1 TABLET IN THE EVENING.   insulin aspart (NOVOLOG) 100 UNIT/ML injection Inject 8 Units into the skin 3 (three) times daily with meals.  (Patient taking differently: Inject 10 Units into the skin once.)   insulin glargine (LANTUS) 100 UNIT/ML injection Inject 0.32 mLs (32 Units total) into the skin daily.   Iron, Ferrous Sulfate, 325 (65 Fe) MG TABS Take 325 mg by mouth 2 (two) times daily.   levothyroxine (SYNTHROID) 50 MCG tablet TAKE 1 TABLET BY MOUTH  DAILY   metoprolol succinate (TOPROL-XL) 50 MG 24 hr tablet TAKE 1 TABLET BY MOUTH  DAILY WITH OR IMMEDIATELY  FOLLOWING A MEAL   pantoprazole (PROTONIX) 40 MG tablet Take 1 tablet (40 mg total) by mouth daily at 6 (six) AM.   polyethylene glycol (MIRALAX / GLYCOLAX) 17 g packet Take 17 g by mouth daily.   pravastatin (PRAVACHOL) 80 MG tablet Take 1 tablet (80 mg total) by mouth at bedtime.   senna (SENOKOT) 8.6 MG TABS tablet Take 2 tablets (17.2 mg total) by mouth at bedtime.   sodium bicarbonate 650 MG tablet Take 1 tablet (650 mg total) by mouth 4 (four) times daily.   sucralfate (CARAFATE) 1 G tablet Take 1 tablet (1 g total) by mouth 4 (four) times daily -  with meals and at bedtime.   VELTASSA 8.4 g packet Take 8.4 g by mouth daily.    amLODipine (NORVASC) 10 MG tablet Take 10 mg by mouth daily.   diltiazem (TIAZAC) 300 MG  24 hr capsule Take 300 mg by mouth daily.   HYDROcodone bit-homatropine (HYCODAN) 5-1.5 MG/5ML syrup Take 5 mLs by mouth every 8 (eight) hours as needed for cough. (Patient not taking: Reported on 03/26/2021)   oxyCODONE (OXY IR/ROXICODONE) 5 MG immediate release tablet Take 1 tablet (5 mg total) by mouth every 6 (six) hours as needed for moderate pain. (Patient not taking: Reported on 03/26/2021)   No facility-administered encounter medications on file as of 03/26/2021.    Allergies (verified) 5-alpha reductase inhibitors, Ace inhibitors, Actos [pioglitazone hydrochloride], Dust mite extract, Dye fdc red [erythrosine red no. 3], and Fish allergy   History: Past Medical History:  Diagnosis Date   Anemia    CAD (coronary artery disease)    Colon  polyps    CRI (chronic renal insufficiency)    DDD (degenerative disc disease)    Diabetes mellitus    INSULIN DEPENDENT   GERD (gastroesophageal reflux disease)    High cholesterol    Hypertension    Pancreatitis    Proteinuria    Past Surgical History:  Procedure Laterality Date   ABDOMINAL HYSTERECTOMY     ANGIOPLASTY     ANKLE SURGERY Right    BACK SURGERY     INCISION AND DRAINAGE ABSCESS N/A 11/09/2018   Procedure: INCISION AND DRAINAGE ABSCESS;  Surgeon: Coralie Keens, MD;  Location: [facility];  Service: General;  Laterality: N/A;   TONSILLECTOMY     Family History  Problem Relation Age of Onset   Diabetes Mother    Diabetes Father    Diabetes Sister    Social History   Socioeconomic History   Marital status: Widowed    Spouse name: Not on file   Number of children: 0   Years of education: Not on file   Highest education level: Not on file  Occupational History   Occupation: Retired  Tobacco Use   Smoking status: Former   Smokeless tobacco: Never   Tobacco comments:    quit smoking in 1995 per pt.  Substance and Sexual Activity   Alcohol use: No    Alcohol/week: 0.0 standard drinks   Drug use: No   Sexual activity: Not on file  Other Topics Concern   Not on file  Social History Narrative   Widow since 2008   Social Determinants of Health   Financial Resource Strain: Low Risk    Difficulty of Paying Living Expenses: Not hard at all  Food Insecurity: No Food Insecurity   Worried About Charity fundraiser in the Last Year: Never true   Arboriculturist in the Last Year: Never true  Transportation Needs: No Transportation Needs   Lack of Transportation (Medical): No   Lack of Transportation (Non-Medical): No  Physical Activity: Inactive   Days of Exercise per Week: 0 days   Minutes of Exercise per Session: 0 min  Stress: No Stress Concern Present   Feeling of Stress : Not at all  Social Connections: Moderately Integrated   Frequency of  Communication with Friends and Family: More than three times a week   Frequency of Social Gatherings with Friends and Family: More than three times a week   Attends Religious Services: More than 4 times per year   Active Member of [employer] or Organizations: Yes   Attends Archivist Meetings: More than 4 times per year   Marital Status: Widowed    Tobacco Counseling Counseling given: Not Answered Tobacco comments: quit smoking in 1995 per pt.  Clinical Intake:  Pre-visit preparation completed: Yes  Pain : 0-10 Pain Score: 3  Pain Type: Chronic pain Pain Location: Knee Pain Descriptors / Indicators: Aching Pain Onset: More than a month ago Pain Frequency: Intermittent     BMI - recorded: 38.97 Nutritional Status: BMI > 30  Obese Nutritional Risks: None  How often do you need to have someone help you when you read instructions, pamphlets, or other written materials from your doctor or pharmacy?: 1 - Never  Diabetic?Nutrition Risk Assessment:  Has the patient had any N/V/D within the last 2 months?  No  Does the patient have any non-healing wounds?  No  Has the patient had any unintentional weight loss or weight gain?  No   Diabetes:  Is the patient diabetic?  Yes  If diabetic, was a CBG obtained today?  No  Did the patient bring in their glucometer from home?  No  How often do you monitor your CBG's? 2-3 times per day.   Financial Strains and Diabetes Management:  Are you having any financial strains with the device, your supplies or your medication? No .  Does the patient want to be seen by Chronic Care Management for management of their diabetes?   Currently being followed by CCM. Would the patient like to be referred to a Nutritionist or for Diabetic Management?  No   Diabetic Exams:  Diabetic Eye Exam: Completed 09/2020.Marland Kitchen Overdue for diabetic eye exam. Pt has been advised about the importance in completing this exam. A referral has been placed today.  Message sent to referral coordinator for scheduling purposes. Advised pt to expect a call from office referred to regarding appt.  Diabetic Foot Exam: Completed 02/07/2020. Pt has been advised about the importance in completing this exam.   Interpreter Needed?: No  Information entered by :: Randal Buba, LPN   Activities of Daily Living In your present state of health, do you have any difficulty performing the following activities: 03/26/2021  Hearing? N  Vision? N  Difficulty concentrating or making decisions? N  Walking or climbing stairs? N  Dressing or bathing? N  Doing errands, shopping? N  Preparing Food and eating ? N  Using the Toilet? N  In the past six months, have you accidently leaked urine? N  Do you have problems with loss of bowel control? N  Managing your Medications? N  Managing your Finances? N  Housekeeping or managing your Housekeeping? N  Some recent data might be hidden    Patient Care Team: Susy Frizzle, MD as PCP - General (Family Medicine) Edythe Clarity, [facility] as Pharmacist (Pharmacist)  Indicate any recent Medical Services you may have received from other than [facility] providers in the past year (date may be approximate).     Assessment:   This is a routine wellness examination for Shadaya.  Hearing/Vision screen Hearing Screening - Comments:: No hearing issues.  Vision Screening - Comments:: Readers. Up to date on eye exam per pt. Dr. Gershon Crane.   Dietary issues and exercise activities discussed: Current Exercise Habits: Home exercise routine, Type of exercise: walking, Time (Minutes): 10, Frequency (Times/Week): 3, Weekly Exercise (Minutes/Week): 30, Exercise limited by: cardiac condition(s);orthopedic condition(s)   Goals Addressed             This Visit's Progress    Exercise 3x per week (30 min per time)       Pt has recumbent bike that she wants to ride more.  Depression Screen PHQ 2/9 Scores 03/26/2021  PHQ - 2 Score  0    Fall Risk Fall Risk  03/26/2021 08/03/2018 12/23/2017 06/24/2017 09/29/2016  Falls in the past year? 1 0 No No No  Number falls in past yr: 1 0 - - -  Injury with Fall? 0 0 - - -  Risk for fall due to : Impaired balance/gait;Impaired mobility;Impaired vision - - - -  Follow up Falls prevention discussed - - - -    FALL RISK PREVENTION PERTAINING TO THE HOME:  Any stairs in or around the home? No  If so, are there any without handrails? No  Home free of loose throw rugs in walkways, pet beds, electrical cords, etc? Yes  Adequate lighting in your home to reduce risk of falls? Yes   ASSISTIVE DEVICES UTILIZED TO PREVENT FALLS:  Life alert? No  Use of a cane, walker or w/c? Yes  Grab bars in the bathroom? No  Shower chair or bench in shower? Yes  Elevated toilet seat or a handicapped toilet? Yes   TIMED UP AND GO:  Was the test performed? No . Pt in wheelchair.    Cognitive Function: MMSE - Mini Mental State Exam 07/24/2015  Orientation to time 5  Orientation to Place 5  Registration 3  Attention/ Calculation 5  Recall 1  Language- name 2 objects 2  Language- repeat 1  Language- follow 3 step command 2  Language- read & follow direction 1  Write a sentence 1  Copy design 1  Total score 27     6CIT Screen 03/26/2021  What Year? 0 points  What month? 0 points  What time? 0 points  Count back from 20 0 points  Months in reverse 0 points  Repeat phrase 0 points  Total Score 0    Immunizations Immunization History  Administered Date(s) Administered   Influenza Whole 06/28/2005, 04/28/2010   Influenza, High Dose Seasonal PF 03/30/2017, 04/12/2018   Influenza,inj,Quad PF,6+ Mos 03/27/2013, 04/08/2014, 03/27/2015, 03/30/2016   Influenza-Unspecified 03/26/2021   Moderna Sars-Covid-2 Vaccination 07/03/2020   PFIZER(Purple Top)SARS-COV-2 Vaccination 08/28/2019, 09/18/2019   Pneumococcal Conjugate-13 04/08/2014   Pneumococcal Polysaccharide-23 02/04/2011, 11/17/2018    Zoster, Live 04/17/2013    TDAP status: Due, Education has been provided regarding the importance of this vaccine. Advised may receive this vaccine at local pharmacy or Health Dept. Aware to provide a copy of the vaccination record if obtained from local pharmacy or Health Dept. Verbalized acceptance and understanding.  Flu Vaccine status: Completed at today's visit  Pneumococcal vaccine status: Up to date  Covid-19 vaccine status: Completed vaccines  Qualifies for Shingles Vaccine? Yes   Zostavax completed No   Shingrix Completed?: No.    Education has been provided regarding the importance of this vaccine. Patient has been advised to call insurance company to determine out of pocket expense if they have not yet received this vaccine. Advised may also receive vaccine at local pharmacy or Health Dept. Verbalized acceptance and understanding.  Screening Tests Health Maintenance  Topic Date Due   TETANUS/TDAP  Never done   Zoster Vaccines- Shingrix (1 of 2) Never done   OPHTHALMOLOGY EXAM  02/12/2020   COVID-19 Vaccine (4 - Booster for Pfizer series) 11/01/2020   FOOT EXAM  02/06/2021   HEMOGLOBIN A1C  07/26/2021   INFLUENZA VACCINE  Completed   DEXA SCAN  Completed   Hepatitis C Screening  Completed   HPV VACCINES  Aged Out    Health Maintenance  Health Maintenance Due  Topic Date Due   TETANUS/TDAP  Never done   Zoster Vaccines- Shingrix (1 of 2) Never done   OPHTHALMOLOGY EXAM  02/12/2020   COVID-19 Vaccine (4 - Booster for Pfizer series) 11/01/2020   FOOT EXAM  02/06/2021    Colorectal cancer screening: No longer required.   Mammogram status: No longer required due to AGE.  Bone Density status: Ordered PT STATES SHE WILL CALL TO SCHEDULE. Pt provided with contact info and advised to call to schedule appt.  Lung Cancer Screening: (Low Dose CT Chest recommended if Age 75-80 years, 30 pack-year currently smoking OR have quit w/in 15years.) does not qualify.      Additional Screening:  Hepatitis C Screening: does not qualify  Vision Screening: Recommended annual ophthalmology exams for early detection of glaucoma and other disorders of the eye. Is the patient up to date with their annual eye exam?  Yes  Who is the provider or what is the name of the office in which the patient attends annual eye exams? Dr. Gershon Crane, seen in March or April of this year per pt.  If pt is not established with a provider, would they like to be referred to a provider to establish care? No .   Dental Screening: Recommended annual dental exams for proper oral hygiene  Community Resource Referral / Chronic Care Management: CRR required this visit?  Yes   CCM required this visit?  No      Plan:     I have personally reviewed and noted the following in the patient's chart:   Medical and social history Use of alcohol, tobacco or illicit drugs  Current medications and supplements including opioid prescriptions. Patient is currently taking opioid prescriptions. Information provided to patient regarding non-opioid alternatives. Patient advised to discuss non-opioid treatment plan with their provider. Functional ability and status Nutritional status Physical activity Advanced directives List of other physicians Hospitalizations, surgeries, and ER visits in previous 12 months Vitals Screenings to include cognitive, depression, and falls Referrals and appointments  In addition, I have reviewed and discussed with patient certain preventive protocols, quality metrics, and best practice recommendations. A written personalized care plan for preventive services as well as general preventive health recommendations were provided to patient.     Chriss Driver, LPN   01/01/4764   Nurse Notes: Pt states she is doing well. Pt states she had eye exam earlier in year with Dr. Gershon Crane. Pt is due for DM foot exam. Pt is due for Bone Density. Offered to place order for  today, pt states she will call and schedule. [facility] referral placed to help patient with search for smaller house or apartment. Flu vaccine given to pt in office today.

## 2021-04-08 ENCOUNTER — Telehealth: Payer: Self-pay

## 2021-04-08 NOTE — Telephone Encounter (Signed)
   Telephone encounter was:  Successful.  04/08/2021 Name: Emily Hughes MRN: [id number] DOB: 11-22-1940  Emily Hughes is a 80 y.o. year old female who is a primary care patient of Pickard, Cammie Mcgee, MD . The community resource team was consulted for assistance with  Housing.  Care guide performed the following interventions:  Patient would like to receive housing information by mail. She lives in [place] and looking for wheelchair accessible home. She is still waiting on her wheelchair but she should have one soon. She does not need any other resources at this time.  Follow Up Plan:  Care guide will follow up with patient by phone over the next few days to ensure she received [facility] documents.  [place] [facility]  [place], [place]  Main Phone: [phone]  E-mail: [email or web address]  Website: [email or web address]

## 2021-04-15 ENCOUNTER — Other Ambulatory Visit: Payer: Self-pay | Admitting: Family Medicine

## 2021-04-15 NOTE — Progress Notes (Deleted)
Chronic Care Management Pharmacy Note  04/15/2021 Name:  Emily Hughes MRN:  [id number] DOB:  Feb 11, 1941  Summary: Initial visit with PharmD.  Complete medication review.  Patient doing well overall did mention some episodes of hypoglycemia on days where she may not have consistently eaten.  Recommendations/Changes made from today's visit: No medication changes  Plan: FU 4 months PAP application for Lantus initiated   Subjective: Emily Hughes is an 80 y.o. year old female who is a primary patient of Pickard, Cammie Mcgee, MD.  The CCM team was consulted for assistance with disease management and care coordination needs.    Engaged with patient by telephone for initial visit in response to provider referral for pharmacy case management and/or care coordination services.   Consent to Services:  The patient was given the following information about Chronic Care Management services today, agreed to services, and gave verbal consent: 1. CCM service includes personalized support from designated clinical staff supervised by the primary care provider, including individualized plan of care and coordination with other care providers 2. 24/7 contact phone numbers for assistance for urgent and routine care needs. 3. Service will only be billed when office clinical staff spend 20 minutes or more in a month to coordinate care. 4. Only one practitioner may furnish and bill the service in a calendar month. 5.The patient may stop CCM services at any time (effective at the end of the month) by phone call to the office staff. 6. The patient will be responsible for cost sharing (co-pay) of up to 20% of the service fee (after annual deductible is met). Patient agreed to services and consent obtained.  Patient Care Team: Susy Frizzle, MD as PCP - General (Family Medicine) Edythe Clarity, [facility] as Pharmacist (Pharmacist)  Recent office visits:  06/30/20 Dr. Dennard Schaumann For Abscess. STARTED Doxycycline  Hyclate 100 mg 2 times daily for 10 days.   Recent consult visits:  07/29/20 Caroline Sauger, Herbie [place] For GERD. No information given.   Hospital visits:  None in previous 6 months   Objective:  Lab Results  Component Value Date   CREATININE 4.72 (H) 01/23/2021   BUN 61 (H) 01/23/2021   GFRNONAA 10 (L) 02/07/2020   GFRAA 11 (L) 02/07/2020   NA 141 01/23/2021   K 4.4 01/23/2021   CALCIUM 9.7 01/23/2021   CO2 23 01/23/2021   GLUCOSE 163 (H) 01/23/2021    Lab Results  Component Value Date/Time   HGBA1C 6.4 (H) 01/23/2021 12:35 PM   HGBA1C 6.7 (H) 02/07/2020 08:31 AM   MICROALBUR 0.6 05/09/2017 09:08 AM   MICROALBUR 1.4 04/08/2014 08:36 AM    Last diabetic Eye exam:  Lab Results  Component Value Date/Time   HMDIABEYEEXA No Retinopathy 02/10/2018 12:00 AM    Last diabetic Foot exam: No results found for: HMDIABFOOTEX   Lab Results  Component Value Date   CHOL 141 02/07/2020   HDL 38 (L) 02/07/2020   LDLCALC 74 02/07/2020   TRIG 197 (H) 02/07/2020   CHOLHDL 3.7 02/07/2020    Hepatic Function Latest Ref Rng & Units 01/23/2021 02/07/2020 01/23/2019  Total Protein 6.1 - 8.1 g/dL 7.2 7.2 6.7  Albumin 3.5 - 5.0 g/dL - - -  AST 10 - 35 U/L 18 16 11   ALT 6 - 29 U/L 12 13 10   Alk Phosphatase 38 - 126 U/L - - -  Total Bilirubin 0.2 - 1.2 mg/dL 0.4 0.5 0.4    Lab Results  Component Value Date/Time   TSH  2.80 01/23/2021 12:35 PM   TSH 3.12 02/07/2020 08:31 AM    CBC Latest Ref Rng & Units 01/23/2021 02/07/2020 01/23/2019  WBC 3.8 - 10.8 Thousand/uL 7.0 6.5 6.6  Hemoglobin 11.7 - 15.5 g/dL 9.4(L) 9.9(L) 8.5(L)  Hematocrit 35.0 - 45.0 % 29.6(L) 31.3(L) 27.0(L)  Platelets 140 - 400 Thousand/uL 293 264 319    No results found for: VD25OH  Clinical ASCVD: No  The 10-year ASCVD risk score (Arnett DK, et al., 2019) is: 28.9%   Values used to calculate the score:     Age: 80 years     Sex: Female     Is Non-Hispanic African American: Yes     Diabetic: Yes     Tobacco smoker:  No     Systolic Blood Pressure: 382 mmHg     Is BP treated: Yes     HDL Cholesterol: 38 mg/dL     Total Cholesterol: 141 mg/dL    Depression screen PHQ 2/9 03/26/2021  Decreased Interest 0  Down, Depressed, Hopeless 0  PHQ - 2 Score 0      Social History   Tobacco Use  Smoking Status Former  Smokeless Tobacco Never  Tobacco Comments   quit smoking in 1995 per pt.   BP Readings from Last 3 Encounters:  01/23/21 (!) 154/78  06/30/20 (!) 120/58  02/07/20 (!) 120/50   Pulse Readings from Last 3 Encounters:  03/26/21 73  01/23/21 90  06/30/20 90   Wt Readings from Last 3 Encounters:  03/26/21 220 lb (99.8 kg)  01/23/21 220 lb (99.8 kg)  02/07/20 230 lb (104.3 kg)   BMI Readings from Last 3 Encounters:  03/26/21 38.97 kg/m  01/23/21 38.97 kg/m  02/07/20 40.74 kg/m    Assessment/Interventions: Review of patient past medical history, allergies, medications, health status, including review of consultants reports, laboratory and other test data, was performed as part of comprehensive evaluation and provision of chronic care management services.   SDOH:  (Social Determinants of Health) assessments and interventions performed: Yes  Financial Resource Strain: Low Risk    Difficulty of Paying Living Expenses: Not hard at all    SDOH Screenings   Alcohol Screen: Low Risk    Last Alcohol Screening Score (AUDIT): 0  Depression (PHQ2-9): Low Risk    PHQ-2 Score: 0  Financial Resource Strain: Low Risk    Difficulty of Paying Living Expenses: Not hard at all  Food Insecurity: No Food Insecurity   Worried About Charity fundraiser in the Last Year: Never true   Ran Out of Food in the Last Year: Never true  Housing: Low Risk    Last Housing Risk Score: 0  Physical Activity: Inactive   Days of Exercise per Week: 0 days   Minutes of Exercise per Session: 0 min  Social Connections: Moderately Integrated   Frequency of Communication with Friends and Family: More than three  times a week   Frequency of Social Gatherings with Friends and Family: More than three times a week   Attends Religious Services: More than 4 times per year   Active Member of [employer] or Organizations: Yes   Attends Archivist Meetings: More than 4 times per year   Marital Status: Widowed  Stress: No Stress Concern Present   Feeling of Stress : Not at all  Tobacco Use: Medium Risk   Smoking Tobacco Use: Former   Smokeless Tobacco Use: Never  Transportation Needs: No Data processing manager (Medical):  No   Lack of Transportation (Non-Medical): No    CCM Care Plan  Allergies  Allergen Reactions   5-Alpha Reductase Inhibitors    Ace Inhibitors    Actos [Pioglitazone Hydrochloride]    Dust Mite Extract    Dye Fdc Red [Erythrosine Red No. 3]    Fish Allergy     Medications Reviewed Today     Reviewed by Chriss Driver, LPN (Licensed Practical Nurse) on 03/26/21 at 4  Med List Status: <None>   Medication Order Taking? Sig Documenting Provider Last Dose Status Informant  acetaminophen (TYLENOL) 325 MG tablet [id number] Yes Take 2 tablets (650 mg total) by mouth every 6 (six) hours as needed for mild pain (or Fever >/= 101). Georgette Shell, MD Taking Active   aspirin 81 MG tablet [id number] Yes Take 81 mg by mouth daily. [provider] Taking Active Self  B-D ULTRAFINE III SHORT PEN 31G X 8 MM MISC [id number] Yes USE WITH LANTUS. Susy Frizzle, MD Taking Active Self  calcitRIOL (ROCALTROL) 0.25 MCG capsule [id number] Yes Take 0.25 mcg by mouth daily. [provider] Taking Active Self  cloNIDine (CATAPRES) 0.1 MG tablet [id number] Yes Take 0.1 mg by mouth 2 (two) times daily.  [provider] Taking Active   colchicine 0.6 MG tablet [id number] Yes TAKE ONE TABLET BY MOUTH DAILY. Susy Frizzle, MD Taking Active   collagenase Annitta Needs) ointment [id number] Yes Apply topically daily. Georgette Shell, MD Taking  Active   cyclobenzaprine (FLEXERIL) 5 MG tablet [id number] Yes Take 1 tablet (5 mg total) by mouth 3 (three) times daily as needed for muscle spasms. Susy Frizzle, MD Taking Active   diltiazem (CARDIZEM CD) 300 MG 24 hr capsule [id number] Yes Take 1 capsule (300 mg total) by mouth daily. [place], Modena Nunnery, MD Taking Active   febuxostat (ULORIC) 40 MG tablet [id number] Yes Take 1 tablet (40 mg total) by mouth daily. Georgette Shell, MD Taking Active   furosemide (LASIX) 40 MG tablet [id number] Yes TAKE 2 TABLETS IN THE MORNING AND 1 TABLET IN THE EVENING. Susy Frizzle, MD Taking Active   HYDROcodone bit-homatropine [facility]) 5-1.5 MG/5ML syrup [id number] No Take 5 mLs by mouth every 8 (eight) hours as needed for cough.  Patient not taking: Reported on 03/26/2021   Susy Frizzle, MD Not Taking Active   insulin aspart (NOVOLOG) 100 UNIT/ML injection [id number] Yes Inject 8 Units into the skin 3 (three) times daily with meals.  Patient taking differently: Inject 10 Units into the skin once.   Georgette Shell, MD Taking Active            Med Note ([place]   Fri Dec 19, 2020  9:09 AM) Patient using 10 units only at lunch   insulin glargine (LANTUS) 100 UNIT/ML injection [id number] Yes Inject 0.32 mLs (32 Units total) into the skin daily. Susy Frizzle, MD Taking Active   Iron, Ferrous Sulfate, 325 (65 Fe) MG TABS [id number] Yes Take 325 mg by mouth 2 (two) times daily. Susy Frizzle, MD Taking Active   levothyroxine (SYNTHROID) 50 MCG tablet [id number] Yes TAKE 1 TABLET BY MOUTH  DAILY Susy Frizzle, MD Taking Active   metoprolol succinate (TOPROL-XL) 50 MG 24 hr tablet [id number] Yes TAKE 1 TABLET BY MOUTH  DAILY WITH OR IMMEDIATELY  FOLLOWING A MEAL Pickard, Cammie Mcgee, MD Taking Active   oxyCODONE (OXY IR/ROXICODONE) 5 MG immediate release tablet [id number] No Take 1 tablet (  5 mg total) by mouth every 6 (six) hours as needed for moderate pain.  Patient not taking:  Reported on 03/26/2021   Georgette Shell, MD Not Taking Active   pantoprazole (PROTONIX) 40 MG tablet [id number] Yes Take 1 tablet (40 mg total) by mouth daily at 6 (six) AM. Georgette Shell, MD Taking Active   polyethylene glycol (MIRALAX / GLYCOLAX) 17 g packet [id number] Yes Take 17 g by mouth daily. Georgette Shell, MD Taking Active   pravastatin (PRAVACHOL) 80 MG tablet [id number] Yes Take 1 tablet (80 mg total) by mouth at bedtime. Susy Frizzle, MD Taking Active   senna (SENOKOT) 8.6 MG TABS tablet [id number] Yes Take 2 tablets (17.2 mg total) by mouth at bedtime. Georgette Shell, MD Taking Active   sodium bicarbonate 650 MG tablet [id number] Yes Take 1 tablet (650 mg total) by mouth 4 (four) times daily. Susy Frizzle, MD Taking Active Self  sucralfate (CARAFATE) 1 G tablet [id number] Yes Take 1 tablet (1 g total) by mouth 4 (four) times daily -  with meals and at bedtime. Susy Frizzle, MD Taking Active Self  VELTASSA 8.4 g packet [id number] Yes Take 8.4 g by mouth daily.  [provider] Taking Active Self            Patient Active Problem List   Diagnosis Date Noted   Acute idiopathic gout of multiple sites    Severe protein-calorie malnutrition ([place])    Acute urinary retention 11/18/2018   Acute renal failure superimposed on stage 4 chronic kidney disease ([place]) 65/09/5463   Acute metabolic encephalopathy 68/06/7516   Cellulitis and abscess of buttock    Abnormal LFTs    Abscess of left buttock 11/08/2018   Sepsis ([place]) 11/08/2018   Atrial fibrillation with RVR ([place]) 11/08/2018   Elevated troponin 11/08/2018   Convulsions ([place]) 07/24/2015   Type 2 diabetes mellitus with ketoacidosis without coma ([place]) 07/24/2015   Leukocytosis    Chronic kidney disease (CKD), stage IV (severe) ([place]) 06/13/2015   Seizures ([place]) 06/12/2015   DKA (diabetic ketoacidoses) 06/12/2015   Seizure ([place]) 06/12/2015   High cholesterol    CAD (coronary artery  disease)    Proteinuria    GERD (gastroesophageal reflux disease)    DYSLIPIDEMIA 02/27/2010   OBESITY, MORBID 02/27/2010   DM 02/24/2010   RHEUMATIC FEVER 02/24/2010   Essential hypertension 02/24/2010   RENAL INSUFFICIENCY, CHRONIC 02/24/2010    Immunization History  Administered Date(s) Administered   Influenza Whole 06/28/2005, 04/28/2010   Influenza, High Dose Seasonal PF 03/30/2017, 04/12/2018   Influenza,inj,Quad PF,6+ Mos 03/27/2013, 04/08/2014, 03/27/2015, 03/30/2016   Influenza-Unspecified 03/26/2021   Moderna Sars-Covid-2 Vaccination 07/03/2020   PFIZER(Purple Top)SARS-COV-2 Vaccination 08/28/2019, 09/18/2019   Pneumococcal Conjugate-13 04/08/2014   Pneumococcal Polysaccharide-23 02/04/2011, 11/17/2018   Zoster, Live 04/17/2013    Conditions to be addressed/monitored:  HTN, CAD, Afib, GERD, Type II DM w/CKD, HLD  There are no care plans that you recently modified to display for this patient.    Medication Assistance: Application for Lantus  medication assistance program. in process.  Anticipated assistance start date unknown.  See plan of care for additional detail.  Compliance/Adherence/Medication fill history: Care Gaps: Needs diabetic eye exam  Star-Rating Drugs: N/A  Patient's preferred pharmacy is:  [facility], [place] [address] Phone: [phone] Fax: [phone]  [facility]) - [place], [place] -  [place] [address] Phone: [phone] Fax: [phone]  [facility]  ([place]) - [place], [place] [facility] [address] [place] [address] [place] [address] Phone: [phone] Fax: [place], [facility] [place] [address] Phone: [phone] Fax:  [phone]  Uses pill box? Yes Pt endorses 100% compliance  We discussed: Benefits of medication synchronization, packaging and delivery as well as enhanced pharmacist oversight with [employer]. Patient decided to: Continue current medication management strategy  Care Plan and Follow Up Patient Decision:  Patient agrees to Care Plan and Follow-up.  Plan: The care management team will reach out to the patient again over the next 120 days.  Beverly Milch, PharmD Clinical Pharmacist [facility] [phone]    Current Barriers:  None specified at this time.  Pharmacist Clinical Goal(s):  Patient will verbalize ability to afford treatment regimen maintain control of glucose and BP as evidenced by home monitoring and regular screenings  through collaboration with PharmD and provider.   Interventions: 1:1 collaboration with Susy Frizzle, MD regarding development and update of comprehensive plan of care as evidenced by provider attestation and co-signature Inter-disciplinary care team collaboration (see longitudinal plan of care) Comprehensive medication review performed; medication list updated in electronic medical record  Hypertension (BP goal <130/80) -Controlled -Current treatment: Clonidine 0.57m BID Cardizem 3039m24 hr capsule daily Metoprolol XL 5056maily -Medications previously tried: amlodipine, valsartan, ACEIs (allergy unspecified)  -Current home readings: 130/70 -Current dietary habits: see DM -Current exercise habits: minimal -Denies hypotensive/hypertensive symptoms -Educated on BP goals and benefits of medications for prevention of heart attack, stroke and kidney damage; Daily salt intake goal < 2300 mg; Importance of home blood pressure monitoring; Symptoms of hypotension and importance of maintaining adequate hydration; -Counseled to monitor BP at home daily, document, and provide log at future appointments -Counseled on diet and  exercise extensively Recommended to continue current medication  Hyperlipidemia: (LDL goal < 70) -Controlled -Current treatment: Pravastatin 35m50mily -Medications previously tried: none noted  -Current dietary patterns: see DM -Current exercise habits: none -Educated on Cholesterol goals;  Benefits of statin for ASCVD risk reduction; Importance of limiting foods high in cholesterol; -Most recent LDL slightly above goal, however acceptable for patient -Reports 100% adherence and tolerates with no concerns -Reminded patient to schedule appointment for physical and updated labs in August with Dr. PickDennard Schaumanncommended to continue current medication  Diabetes (A1c goal <7%) -Controlled -Current medications: Novolog 10 units only at lunch time Lantus 30 units daily - AM -Medications previously tried: glimepiride  -Current home glucose readings fasting glucose: 120-135 post prandial glucose: unknown -Reports hypoglycemic/hyperglycemic symptoms, very infrequently at night she will wake up with a low blood sugar in the 50s-60s.  This is because she "has not done what she was supposed to do."  Her low BG only happens when she does not eat.  She is now only taking meal time insulin with lunch since this is her biggest meal of the day. -Current meal patterns:  breakfast: grits/oatmeal  lunch: salad/sandwich  dinner: leftovers from lunch snacks:  drinks: water, lemonade -Current exercise: minimal -Educated on A1c and blood sugar goals; Prevention and management of hypoglycemic episodes; Benefits of routine self-monitoring of blood sugar; Importance of eating regular meals on insulin to avoid hypoglycemia -Counseled to check feet daily and get yearly  eye exams -Counseled on diet and exercise extensively Recommended to continue current medication  Atrial Fibrillation (Goal: prevent stroke and major bleeding) -Controlled  -Current treatment: Rate control: Metoprolol XL  46m Anticoagulation: None -Medications previously tried: none noted -Home BP and HR readings: 130/70, unsure of pulse  -Counseled on increased risk of stroke due to Afib and benefits of anticoagulation for stroke prevention; importance of regular laboratory monitoring; -Recommended to continue current medication  GERD (Goal: Control symptoms) -Not ideally controlled -Current treatment  Pantoprazole 457mdaily Sucralfate 1g qid prn -Medications previously tried: none noted -She reports some symptoms of acid reflux. -She already elevates the head of her bed and takes above medications correctly. -We discussed working on trigger foods and limiting the amount of these we eat.  -Recommended to continue current medication  Patient Goals/Self-Care Activities Patient will:  - take medications as prescribed check blood pressure at least a few times per week, document, and provide at future appointments weigh daily, and contact provider if weight gain of 3 lbs in one day or 5 lbs in a week collaborate with provider on medication access solutions  Follow Up Plan: The care management team will reach out to the patient again over the next 120 days.

## 2021-04-23 ENCOUNTER — Telehealth: Payer: Self-pay

## 2021-04-24 ENCOUNTER — Telehealth: Payer: Self-pay

## 2021-04-24 NOTE — Telephone Encounter (Signed)
   Telephone encounter was:  Successful.  04/24/2021 Name: Emily Hughes MRN: [id number] DOB: Nov 16, 1940  Emily Hughes is a 80 y.o. year old female who is a primary care patient of Pickard, Cammie Mcgee, MD . The community resource team was consulted for assistance with  Housing.  Care guide performed the following interventions:  Patient received the Housing list for [facility]. I also sent another list that shows no waiting list and handicap accessible as well. She has started her search with the list provided. There are no further questions or concerns at this time. Patient will call if needed.  Follow Up Plan:  No further follow up planned at this time. The patient has been provided with needed resources.  [facility] management  [place], [place]  Main Phone: [phone]  E-mail: [email or web address]  Website: [email or web address]

## 2021-05-25 DIAGNOSIS — N2581 Secondary hyperparathyroidism of renal origin: Secondary | ICD-10-CM | POA: Diagnosis not present

## 2021-05-25 DIAGNOSIS — E1122 Type 2 diabetes mellitus with diabetic chronic kidney disease: Secondary | ICD-10-CM | POA: Diagnosis not present

## 2021-05-25 DIAGNOSIS — D631 Anemia in chronic kidney disease: Secondary | ICD-10-CM | POA: Diagnosis not present

## 2021-05-25 DIAGNOSIS — E872 Acidosis, unspecified: Secondary | ICD-10-CM | POA: Diagnosis not present

## 2021-05-25 DIAGNOSIS — N189 Chronic kidney disease, unspecified: Secondary | ICD-10-CM | POA: Diagnosis not present

## 2021-05-25 DIAGNOSIS — N185 Chronic kidney disease, stage 5: Secondary | ICD-10-CM | POA: Diagnosis not present

## 2021-05-25 DIAGNOSIS — I120 Hypertensive chronic kidney disease with stage 5 chronic kidney disease or end stage renal disease: Secondary | ICD-10-CM | POA: Diagnosis not present

## 2021-06-01 ENCOUNTER — Other Ambulatory Visit: Payer: Self-pay | Admitting: Family Medicine

## 2021-06-03 ENCOUNTER — Other Ambulatory Visit: Payer: Self-pay | Admitting: *Deleted

## 2021-06-05 ENCOUNTER — Other Ambulatory Visit: Payer: Self-pay

## 2021-06-05 MED ORDER — INSULIN ASPART 100 UNIT/ML IJ SOLN
INTRAMUSCULAR | 5 refills | Status: DC
Start: 2021-06-05 — End: 2021-06-09

## 2021-06-08 ENCOUNTER — Telehealth: Payer: Self-pay

## 2021-06-08 NOTE — Telephone Encounter (Signed)
PA for Novolog was DENIED. They are requesting pt use Humalog, Insulin Lispro or Lyumjev instead.   Please advise, thanks!

## 2021-06-09 DIAGNOSIS — D631 Anemia in chronic kidney disease: Secondary | ICD-10-CM | POA: Insufficient documentation

## 2021-06-09 DIAGNOSIS — Z794 Long term (current) use of insulin: Secondary | ICD-10-CM | POA: Insufficient documentation

## 2021-06-09 DIAGNOSIS — Z860100 Personal history of colon polyps, unspecified: Secondary | ICD-10-CM | POA: Insufficient documentation

## 2021-06-09 DIAGNOSIS — K5900 Constipation, unspecified: Secondary | ICD-10-CM | POA: Insufficient documentation

## 2021-06-09 DIAGNOSIS — D126 Benign neoplasm of colon, unspecified: Secondary | ICD-10-CM | POA: Insufficient documentation

## 2021-06-09 DIAGNOSIS — D509 Iron deficiency anemia, unspecified: Secondary | ICD-10-CM | POA: Insufficient documentation

## 2021-06-09 MED ORDER — INSULIN LISPRO 100 UNIT/ML ~~LOC~~ SOLN: 10.0000 [IU] | Freq: Every day | SUBCUTANEOUS | 11 refills | Status: DC

## 2021-06-09 NOTE — Telephone Encounter (Signed)
Spoke with pt and she states she takes 10units of Novolog at lunchtime. She does not take this at any other time of the day. Explained to pt her insurance is requesting she switch to Humalog and we would send in a new rx. Pt voiced understanding. Pt also states she will be calling her insurance company to find out why they would not approve the Novolog.   Rx sent.

## 2021-06-09 NOTE — Telephone Encounter (Signed)
Pt has 2 different rx's for Novolog in chart. Need to clarify what dose pt is currently taking.  ATC pt - no answer and no vm set up

## 2021-06-13 ENCOUNTER — Other Ambulatory Visit: Payer: Self-pay | Admitting: Family Medicine

## 2021-06-24 ENCOUNTER — Telehealth: Payer: Self-pay | Admitting: Pharmacist

## 2021-06-24 NOTE — Progress Notes (Addendum)
Chronic Care Management Pharmacy Assistant   Name: Emily Hughes  MRN: [id number] DOB: 09-22-1940   Reason for Encounter: Disease State - Diabetes Call    Recent office visits:  None noted.  Recent consult visits:  None noted.  Hospital visits:  None in previous 6 months  Medications: Outpatient Encounter Medications as of 06/24/2021  Medication Sig   acetaminophen (TYLENOL) 325 MG tablet Take 2 tablets (650 mg total) by mouth every 6 (six) hours as needed for mild pain (or Fever >/= 101).   amLODipine (NORVASC) 10 MG tablet Take 10 mg by mouth daily.   aspirin 81 MG tablet Take 81 mg by mouth daily.   B-D ULTRAFINE III SHORT PEN 31G X 8 MM MISC USE WITH LANTUS.   calcitRIOL (ROCALTROL) 0.25 MCG capsule Take 0.25 mcg by mouth daily.   cloNIDine (CATAPRES) 0.1 MG tablet Take 0.1 mg by mouth 2 (two) times daily.    colchicine 0.6 MG tablet TAKE ONE TABLET BY MOUTH DAILY.   collagenase (SANTYL) ointment Apply topically daily.   cyclobenzaprine (FLEXERIL) 5 MG tablet Take 1 tablet (5 mg total) by mouth 3 (three) times daily as needed for muscle spasms.   diltiazem (CARDIZEM CD) 300 MG 24 hr capsule Take 1 capsule (300 mg total) by mouth daily.   diltiazem (TIAZAC) 300 MG 24 hr capsule Take 300 mg by mouth daily.   famotidine (PEPCID) 40 MG tablet Take 40 mg by mouth at bedtime.   febuxostat (ULORIC) 40 MG tablet Take 1 tablet (40 mg total) by mouth daily.   furosemide (LASIX) 40 MG tablet TAKE 2 TABLETS IN THE MORNING AND 1 TABLET IN THE EVENING.   HYDROcodone bit-homatropine (HYCODAN) 5-1.5 MG/5ML syrup Take 5 mLs by mouth every 8 (eight) hours as needed for cough. (Patient not taking: Reported on 03/26/2021)   insulin glargine (LANTUS) 100 UNIT/ML injection Inject 0.32 mLs (32 Units total) into the skin daily.   insulin lispro (HUMALOG) 100 UNIT/ML injection Inject 0.1 mLs (10 Units total) into the skin daily with lunch.   Iron, Ferrous Sulfate, 325 (65 Fe) MG TABS Take 325  mg by mouth 2 (two) times daily.   LANTUS SOLOSTAR 100 UNIT/ML Solostar Pen INJECT 0.32ML (32 UNITS) INTO THE SKIN DAILY.   levothyroxine (SYNTHROID) 50 MCG tablet TAKE 1 TABLET BY MOUTH  DAILY   metoprolol succinate (TOPROL-XL) 50 MG 24 hr tablet TAKE 1 TABLET BY MOUTH  DAILY WITH OR IMMEDIATELY  FOLLOWING A MEAL   omeprazole (PRILOSEC) 40 MG capsule Take 40 mg by mouth every morning.   oxyCODONE (OXY IR/ROXICODONE) 5 MG immediate release tablet Take 1 tablet (5 mg total) by mouth every 6 (six) hours as needed for moderate pain. (Patient not taking: Reported on 03/26/2021)   pantoprazole (PROTONIX) 40 MG tablet Take 1 tablet (40 mg total) by mouth daily at 6 (six) AM.   polyethylene glycol (MIRALAX / GLYCOLAX) 17 g packet Take 17 g by mouth daily.   pravastatin (PRAVACHOL) 80 MG tablet Take 1 tablet (80 mg total) by mouth at bedtime.   senna (SENOKOT) 8.6 MG TABS tablet Take 2 tablets (17.2 mg total) by mouth at bedtime.   sodium bicarbonate 650 MG tablet Take 1 tablet (650 mg total) by mouth 4 (four) times daily.   sucralfate (CARAFATE) 1 G tablet Take 1 tablet (1 g total) by mouth 4 (four) times daily -  with meals and at bedtime.   VELTASSA 8.4 g packet Take 8.4 g by  mouth daily.    No facility-administered encounter medications on file as of 06/24/2021.   Current antihyperglycemic regimen:  Humalog10 units only at lunch time Lantus 30 units daily - AM   What recent interventions/DTPs have been made to improve glycemic control:  Patient reported she was recently changed to Humalog in place of Novolog as her insurance would no longer cover that medication. She does request that the rx be sent for the flexpens as she got vials and she prefers the flex pens for the future.   Have there been any recent hospitalizations or ED visits since last visit with CPP?  Patient has not had hospitalization or ED visit since her last visit with [facility]PP.   Patient denies hypoglycemic symptoms, including  Pale, Sweaty, Shaky, Hungry, Nervous/irritable, and Vision changes   Patient denies hyperglycemic symptoms, including blurry vision, excessive thirst, fatigue, polyuria, and weakness   How often are you checking your blood sugar? Patient is checking her blood sugar 1-2 times daily    What are your blood sugars ranging?  Fasting: 120 Before meals:  After meals:  Bedtime:   During the week, how often does your blood glucose drop below 70?  Patient denied any readings lower than 70.   Are you checking your feet daily/regularly? Patient reported she is checking her feet daily and currently has no concerns.     Adherence Review: Is the patient currently on a STATIN medication? Yes Is the patient currently on ACE/ARB medication? No Does the patient have >5 day gap between last estimated fill dates? No   Care Gaps  AWV: done 03/26/21 Colonoscopy: done 07/06/03 DM Eye Exam: due 02/12/20 DM Foot Exam: due 02/06/21 Microalbumin: unknown  HbgAIC: one 01/23/21 (6.4) DEXA: done 11/15/13 Mammogram: done 04/07/18  Star Rating Drugs: pravastatin (PRAVACHOL) 80 MG tablet - uses mail order  Future Appointments  Date Time Provider [place]  04/01/2022 12:00 PM [facility]-NURSE HEALTH ADVISOR [facility] None    Jobe Gibbon, [facility] Clinical Pharmacist Assistant  [phone]

## 2021-07-16 DIAGNOSIS — H52203 Unspecified astigmatism, bilateral: Secondary | ICD-10-CM | POA: Diagnosis not present

## 2021-07-16 DIAGNOSIS — H524 Presbyopia: Secondary | ICD-10-CM | POA: Diagnosis not present

## 2021-07-16 DIAGNOSIS — Z794 Long term (current) use of insulin: Secondary | ICD-10-CM | POA: Diagnosis not present

## 2021-07-16 DIAGNOSIS — E119 Type 2 diabetes mellitus without complications: Secondary | ICD-10-CM | POA: Diagnosis not present

## 2021-07-16 DIAGNOSIS — Z961 Presence of intraocular lens: Secondary | ICD-10-CM | POA: Diagnosis not present

## 2021-07-16 LAB — HM DIABETES EYE EXAM

## 2021-07-17 ENCOUNTER — Other Ambulatory Visit: Payer: Self-pay | Admitting: Family Medicine

## 2021-07-21 ENCOUNTER — Emergency Department (HOSPITAL_COMMUNITY): Payer: Medicare Other

## 2021-07-21 ENCOUNTER — Emergency Department (HOSPITAL_COMMUNITY)
Admission: EM | Admit: 2021-07-21 | Discharge: 2021-07-22 | Disposition: A | Discharge status: Home / Self Care | Payer: Medicare Other | Attending: Emergency Medicine | Admitting: Emergency Medicine

## 2021-07-21 ENCOUNTER — Encounter (HOSPITAL_COMMUNITY): Payer: Self-pay

## 2021-07-21 ENCOUNTER — Other Ambulatory Visit: Payer: Self-pay

## 2021-07-21 DIAGNOSIS — N189 Chronic kidney disease, unspecified: Secondary | ICD-10-CM | POA: Insufficient documentation

## 2021-07-21 DIAGNOSIS — E162 Hypoglycemia, unspecified: Secondary | ICD-10-CM

## 2021-07-21 DIAGNOSIS — R0689 Other abnormalities of breathing: Secondary | ICD-10-CM | POA: Diagnosis not present

## 2021-07-21 DIAGNOSIS — E11649 Type 2 diabetes mellitus with hypoglycemia without coma: Secondary | ICD-10-CM | POA: Insufficient documentation

## 2021-07-21 DIAGNOSIS — E1022 Type 1 diabetes mellitus with diabetic chronic kidney disease: Secondary | ICD-10-CM | POA: Diagnosis not present

## 2021-07-21 DIAGNOSIS — M47814 Spondylosis without myelopathy or radiculopathy, thoracic region: Secondary | ICD-10-CM | POA: Diagnosis not present

## 2021-07-21 DIAGNOSIS — Z7982 Long term (current) use of aspirin: Secondary | ICD-10-CM | POA: Insufficient documentation

## 2021-07-21 LAB — CBC WITH DIFFERENTIAL/PLATELET
Abs Immature Granulocytes: 0.02 10*3/uL (ref 0.00–0.07)
Basophils Absolute: 0 10*3/uL (ref 0.0–0.1)
Basophils Relative: 1 %
Eosinophils Absolute: 0.1 10*3/uL (ref 0.0–0.5)
Eosinophils Relative: 2 %
HCT: 30.5 % — ABNORMAL LOW (ref 36.0–46.0)
Hemoglobin: 9.5 g/dL — ABNORMAL LOW (ref 12.0–15.0)
Immature Granulocytes: 0 %
Lymphocytes Relative: 31 %
Lymphs Abs: 1.8 10*3/uL (ref 0.7–4.0)
MCH: 26.7 pg (ref 26.0–34.0)
MCHC: 31.1 g/dL (ref 30.0–36.0)
MCV: 85.7 fL (ref 80.0–100.0)
Monocytes Absolute: 0.5 10*3/uL (ref 0.1–1.0)
Monocytes Relative: 9 %
Neutro Abs: 3.2 10*3/uL (ref 1.7–7.7)
Neutrophils Relative %: 57 %
Platelets: 292 10*3/uL (ref 150–400)
RBC: 3.56 MIL/uL — ABNORMAL LOW (ref 3.87–5.11)
RDW: 16.1 % — ABNORMAL HIGH (ref 11.5–15.5)
WBC: 5.7 10*3/uL (ref 4.0–10.5)
nRBC: 0 % (ref 0.0–0.2)

## 2021-07-21 LAB — CBG MONITORING, ED
Glucose-Capillary: 68 mg/dL — ABNORMAL LOW (ref 70–99)
Glucose-Capillary: 114 mg/dL — ABNORMAL HIGH (ref 70–99)
Glucose-Capillary: 167 mg/dL — ABNORMAL HIGH (ref 70–99)

## 2021-07-21 LAB — COMPREHENSIVE METABOLIC PANEL
ALT: 22 U/L (ref 0–44)
AST: 27 U/L (ref 15–41)
Albumin: 4.1 g/dL (ref 3.5–5.0)
Alkaline Phosphatase: 153 U/L — ABNORMAL HIGH (ref 38–126)
Anion gap: 10 (ref 5–15)
BUN: 70 mg/dL — ABNORMAL HIGH (ref 8–23)
CO2: 24 mmol/L (ref 22–32)
Calcium: 9.4 mg/dL (ref 8.9–10.3)
Chloride: 103 mmol/L (ref 98–111)
Creatinine, Ser: 5.19 mg/dL — ABNORMAL HIGH (ref 0.44–1.00)
GFR, Estimated: 8 mL/min — ABNORMAL LOW (ref >60)
Glucose, Bld: 184 mg/dL — ABNORMAL HIGH (ref 70–99)
Potassium: 5.6 mmol/L — ABNORMAL HIGH (ref 3.5–5.1)
Sodium: 137 mmol/L (ref 135–145)
Total Bilirubin: 0.5 mg/dL (ref 0.3–1.2)
Total Protein: 7.5 g/dL (ref 6.5–8.1)

## 2021-07-21 NOTE — ED Provider Triage Note (Signed)
Emergency Medicine Provider Triage Evaluation Note  Emily Hughes , a 81 y.o. female  was evaluated in triage.  Pt complains of  hypoglycemia that has been ongoing for approximately 2 weeks.  Family member reports that patient recently had her insulin changed.  She is unsure what the specific change was.  She is unsure whether the dose was increased or she is taking any medication however reports that since the change 3 weeks ago she has been having episodes of hypoglycemia.  They are improved after she eats however then it quickly drops down again.  Family member reports that when her sugar is low she is acting out of it however currently she is acting at baseline.  She denies any specific complaints at this time.  Review of Systems  Positive: + hypoglycemia, confusion intermittently Negative: - fevers, chills, abd pain, cough, urinary symptoms  Physical Exam  BP (!) 150/63 (BP Location: Left Arm)    Pulse 62    Temp 97.8 F (36.6 C) (Oral)    Resp 16    Ht 5\' 3"  (1.6 m)    Wt 99.8 kg    SpO2 97%    BMI 38.97 kg/m  Gen:   Awake, no distress   Resp:  Normal effort  MSK:   Moves extremities without difficulty  Other:    Medical Decision Making  Medically screening exam initiated at 7:29 PM.  Appropriate orders placed.  Makeyla Rennels was informed that the remainder of the evaluation will be completed by another provider, this initial triage assessment does not replace that evaluation, and the importance of remaining in the ED until their evaluation is complete.  CBG 68. Mentating appropriately. Pt given juice.    Eustaquio Maize, PA-C 07/21/21 1931

## 2021-07-21 NOTE — ED Triage Notes (Signed)
Pt states  that she started a new insulin regimen per her primary provider 2-3 weeks. Pt states that her sugar has been dropping and she has been unable to maintain it.

## 2021-07-22 LAB — CBG MONITORING, ED
Glucose-Capillary: 138 mg/dL — ABNORMAL HIGH (ref 70–99)
Glucose-Capillary: 92 mg/dL (ref 70–99)
Glucose-Capillary: 109 mg/dL — ABNORMAL HIGH (ref 70–99)

## 2021-07-22 NOTE — Discharge Instructions (Signed)
Follow-up with your doctor for repeat blood work.  Continue with medications as prescribed.  Continue monitoring your blood sugar.

## 2021-07-22 NOTE — ED Notes (Signed)
Patient reports she does not have an insulin pump.  Recent change of insulins and having trouble without "sugars" dropping

## 2021-07-22 NOTE — ED Provider Notes (Signed)
[facility] DEPT Provider Note   CSN: [id number] Arrival date & time: 07/21/21  1909     History  Chief Complaint  Patient presents with   Hypoglycemia    Emily Hughes is a 81 y.o. female.  81 year old female with history of insulin-dependent diabetes, chronic kidney disease presents with complaint of hypoglycemia.  Patient states that her insurance no longer covers her insulin regimen and her NovoLog has been switched to a new medication which comes in a vial.  Patient continues to use her Lantus without difficulty.  Patient checked her blood sugar yesterday evening because her mouth felt dry and found her blood sugar to be 30.  Patient then ate some chicken pot pie and a few bites of cake and came to the emergency room where her glucose was recorded as 68.  Patient was given some orange juice and monitored.  Her blood sugar has improved.  She is feeling well at this time without further complaints although is concerned she has not eaten anything.      Home Medications Prior to Admission medications   Medication Sig Start Date End Date Taking? Authorizing Provider  acetaminophen (TYLENOL) 325 MG tablet Take 2 tablets (650 mg total) by mouth every 6 (six) hours as needed for mild pain (or Fever >/= 101). 11/29/18   Georgette Shell, MD  amLODipine (NORVASC) 10 MG tablet Take 10 mg by mouth daily. 03/23/21   [provider]  aspirin 81 MG tablet Take 81 mg by mouth daily.    [provider]  B-D ULTRAFINE III SHORT PEN 31G X 8 MM MISC USE WITH LANTUS. 07/31/18   Susy Frizzle, MD  calcitRIOL (ROCALTROL) 0.25 MCG capsule Take 0.25 mcg by mouth daily.    [provider]  cloNIDine (CATAPRES) 0.1 MG tablet Take 0.1 mg by mouth 2 (two) times daily.  12/06/18   [provider]  colchicine 0.6 MG tablet TAKE ONE TABLET BY MOUTH DAILY. 07/17/21   Susy Frizzle, MD  collagenase (SANTYL) ointment Apply topically daily.  11/29/18   Georgette Shell, MD  cyclobenzaprine (FLEXERIL) 5 MG tablet Take 1 tablet (5 mg total) by mouth 3 (three) times daily as needed for muscle spasms. 07/14/20   Susy Frizzle, MD  diltiazem (CARDIZEM CD) 300 MG 24 hr capsule Take 1 capsule (300 mg total) by mouth daily. 01/01/20   Mango, Modena Nunnery, MD  diltiazem [employer]) 300 MG 24 hr capsule Take 300 mg by mouth daily. 12/08/20   [provider]  famotidine (PEPCID) 40 MG tablet Take 40 mg by mouth at bedtime. 01/12/21   [provider]  febuxostat (ULORIC) 40 MG tablet Take 1 tablet (40 mg total) by mouth daily. 11/29/18   Georgette Shell, MD  furosemide (LASIX) 40 MG tablet TAKE 2 TABLETS IN THE MORNING AND 1 TABLET IN THE EVENING. 01/23/19   Susy Frizzle, MD  HYDROcodone bit-homatropine (HYCODAN) 5-1.5 MG/5ML syrup Take 5 mLs by mouth every 8 (eight) hours as needed for cough. Patient not taking: Reported on 03/26/2021 03/10/21   Susy Frizzle, MD  insulin glargine (LANTUS) 100 UNIT/ML injection Inject 0.32 mLs (32 Units total) into the skin daily. 02/27/20   Susy Frizzle, MD  insulin lispro (HUMALOG) 100 UNIT/ML injection Inject 0.1 mLs (10 Units total) into the skin daily with lunch. 06/09/21   Susy Frizzle, MD  Iron, Ferrous Sulfate, 325 (65 Fe) MG TABS Take 325 mg by mouth  2 (two) times daily. 01/23/21   Susy Frizzle, MD  LANTUS SOLOSTAR 100 UNIT/ML Solostar Pen INJECT 0.32ML (32 UNITS) INTO THE SKIN DAILY. 04/15/21   Susy Frizzle, MD  levothyroxine (SYNTHROID) 50 MCG tablet TAKE 1 TABLET BY MOUTH  DAILY 06/15/21   Susy Frizzle, MD  metoprolol succinate (TOPROL-XL) 50 MG 24 hr tablet TAKE 1 TABLET BY MOUTH  DAILY WITH OR IMMEDIATELY  FOLLOWING A MEAL 01/19/21   Susy Frizzle, MD  omeprazole (PRILOSEC) 40 MG capsule Take 40 mg by mouth every morning. 06/02/21   [provider]  oxyCODONE (OXY IR/ROXICODONE) 5 MG immediate release tablet Take 1 tablet (5 mg total) by mouth  every 6 (six) hours as needed for moderate pain. Patient not taking: Reported on 03/26/2021 11/29/18   Georgette Shell, MD  pantoprazole (PROTONIX) 40 MG tablet Take 1 tablet (40 mg total) by mouth daily at 6 (six) AM. 11/30/18   Georgette Shell, MD  polyethylene glycol (MIRALAX / GLYCOLAX) 17 g packet Take 17 g by mouth daily. 11/29/18   Georgette Shell, MD  pravastatin (PRAVACHOL) 80 MG tablet Take 1 tablet (80 mg total) by mouth at bedtime. 02/16/19   Susy Frizzle, MD  senna (SENOKOT) 8.6 MG TABS tablet Take 2 tablets (17.2 mg total) by mouth at bedtime. 11/29/18   Georgette Shell, MD  sodium bicarbonate 650 MG tablet Take 1 tablet (650 mg total) by mouth 4 (four) times daily. 07/19/14   Susy Frizzle, MD  sucralfate (CARAFATE) 1 G tablet Take 1 tablet (1 g total) by mouth 4 (four) times daily -  with meals and at bedtime. 07/11/14   Susy Frizzle, MD  VELTASSA 8.4 g packet Take 8.4 g by mouth daily.  12/20/17   [provider]      Allergies    5-alpha reductase inhibitors, Ace inhibitors, Actos [pioglitazone hydrochloride], Angiotensin receptor blockers, Dust mite extract, Dye fdc red [erythrosine red no. 3], Fish allergy, Iodinated contrast media, and Pioglitazone    Review of Systems   Review of Systems  Constitutional:  Negative for chills and fever.  Respiratory:  Negative for shortness of breath.   Cardiovascular:  Negative for chest pain.  Gastrointestinal:  Negative for abdominal pain, constipation, diarrhea, nausea and vomiting.  Genitourinary:  Negative for dysuria and frequency.  Musculoskeletal:  Negative for arthralgias and myalgias.  Skin:  Negative for rash and wound.  Allergic/Immunologic: Positive for immunocompromised state.  Neurological:  Negative for weakness.  Hematological:  Negative for adenopathy.  Psychiatric/Behavioral:  Negative for confusion.   All other systems reviewed and are negative.  Physical Exam Updated Vital  Signs BP (!) 179/80 (BP Location: Right Arm)    Pulse 75    Temp 97.8 F (36.6 C) (Oral)    Resp 16    Ht 5\' 3"  (1.6 m)    Wt 99.8 kg    SpO2 98%    BMI 38.97 kg/m  Physical Exam Vitals and nursing note reviewed.  HENT:     Head: Normocephalic and atraumatic.  Eyes:     Conjunctiva/sclera: Conjunctivae normal.  Cardiovascular:     Rate and Rhythm: Normal rate and regular rhythm.     Heart sounds: Normal heart sounds.  Pulmonary:     Effort: Pulmonary effort is normal.     Breath sounds: Normal breath sounds.  Abdominal:     Palpations: Abdomen is soft.     Tenderness: There is no abdominal  tenderness.  Musculoskeletal:        General: No tenderness.  Skin:    General: Skin is warm and dry.     Findings: No erythema or rash.  Neurological:     Mental Status: She is alert and oriented to person, place, and time.  Psychiatric:        Behavior: Behavior normal.    ED Results / Procedures / Treatments   Labs (all labs ordered are listed, but only abnormal results are displayed) Labs Reviewed  COMPREHENSIVE METABOLIC PANEL - Abnormal; Notable for the following components:      Result Value   Potassium 5.6 (*)    Glucose, Bld 184 (*)    BUN 70 (*)    Creatinine, Ser 5.19 (*)    Alkaline Phosphatase 153 (*)    GFR, Estimated 8 (*)    All other components within normal limits  CBC WITH DIFFERENTIAL/PLATELET - Abnormal; Notable for the following components:   RBC 3.56 (*)    Hemoglobin 9.5 (*)    HCT 30.5 (*)    RDW 16.1 (*)    All other components within normal limits  CBG MONITORING, ED - Abnormal; Notable for the following components:   Glucose-Capillary 68 (*)    All other components within normal limits  CBG MONITORING, ED - Abnormal; Notable for the following components:   Glucose-Capillary 114 (*)    All other components within normal limits  CBG MONITORING, ED - Abnormal; Notable for the following components:   Glucose-Capillary 167 (*)    All other components  within normal limits  CBG MONITORING, ED - Abnormal; Notable for the following components:   Glucose-Capillary 138 (*)    All other components within normal limits  CBG MONITORING, ED - Abnormal; Notable for the following components:   Glucose-Capillary 109 (*)    All other components within normal limits  CBG MONITORING, ED    EKG EKG Interpretation  Date/Time:  Tuesday July 21 2021 19:38:32 EST Ventricular Rate:  61 PR Interval:  198 QRS Duration: 92 QT Interval:  443 QTC Calculation: 447 R Axis:   -8 Text Interpretation: Sinus rhythm Left atrial enlargement Probable LVH with secondary repol abnrm Confirmed by Quintella Reichert [phone]) on 07/22/2021 4:23:19 AM  Radiology DG Chest 2 View  Result Date: 07/21/2021 CLINICAL DATA:  Rule out infection. EXAM: CHEST - 2 VIEW COMPARISON:  Chest radiograph dated 11/15/2018. FINDINGS: Shallow inspiration. No focal consolidation, pleural effusion, pneumothorax. The cardiac silhouette is within normal limits. Degenerative changes of the spine. No acute osseous pathology. IMPRESSION: No active cardiopulmonary disease. Electronically Signed   By: Anner Crete M.D.   On: 07/21/2021 20:01    Procedures Procedures    Medications Ordered in ED Medications - No data to display  ED Course/ Medical Decision Making/ A&P                           Medical Decision Making  81 year old female with insulin-dependent diabetes presents emergency room with concern for low blood sugar reading.  Patient managed her symptoms at home prior to arrival as above.  On arrival, glucose improved to 68.  Patient was given some orange juice and monitored.  After extensive wait in the emergency room lobby, patient is seen in her room and states that she is feeling better and would like to be discharged after having something to eat.        Final Clinical Impression(s) /  ED Diagnoses Final diagnoses:  Hypoglycemia    Rx / DC Orders ED Discharge Orders      None         Tacy Learn, PA-C 07/22/21 1503    Daleen Bo, MD 07/22/21 1727

## 2021-07-22 NOTE — ED Notes (Signed)
Patient given food and drink.

## 2021-07-23 ENCOUNTER — Other Ambulatory Visit: Payer: Self-pay

## 2021-07-23 ENCOUNTER — Ambulatory Visit (INDEPENDENT_AMBULATORY_CARE_PROVIDER_SITE_OTHER): Payer: Medicare Other | Admitting: Family Medicine

## 2021-07-23 ENCOUNTER — Encounter: Payer: Self-pay | Admitting: Family Medicine

## 2021-07-23 VITALS — BP 138/68 | HR 77 | Temp 97.9°F | Resp 18 | Ht 63.0 in | Wt 222.0 lb

## 2021-07-23 DIAGNOSIS — N186 End stage renal disease: Secondary | ICD-10-CM | POA: Diagnosis not present

## 2021-07-23 DIAGNOSIS — N184 Chronic kidney disease, stage 4 (severe): Secondary | ICD-10-CM

## 2021-07-23 DIAGNOSIS — E1122 Type 2 diabetes mellitus with diabetic chronic kidney disease: Secondary | ICD-10-CM

## 2021-07-23 MED ORDER — DICLOFENAC SODIUM 1 % EX GEL: 2.0000 g | Freq: Four times a day (QID) | CUTANEOUS | 2 refills | Status: DC

## 2021-07-23 NOTE — Progress Notes (Signed)
Subjective:    Patient ID: Emily Hughes, female    DOB: 1940/09/09, 81 y.o.   MRN: [id number]  HPI Patient is a very pleasant 81 year old African-American female who presents today with hypoglycemia.  She has a history of stage IV to stage V chronic kidney disease.  In 2020, her creatinine was around 2.8.  Recently when she went to the hospital, her creatinine was 5.17.  Essentially her renal function has declined rapidly over the last 2 years.  She is on insulin for type 2 diabetes mellitus.  She is on 30 units of Lantus once daily and 10 units of Humalog with lunch.  Her A1c has been well controlled.  Last summer it was 6.4.  However recently she had to go to the hospital with hypoglycemia into the 30s.  She states that this does not happen recently.  Usually her sugars are between 101 150.  However I feel that with her declining renal function, her effective strength of insulin is increasing due to decreased renal clearance. Past Medical History:  Diagnosis Date   Anemia    CAD (coronary artery disease)    Colon polyps    CRI (chronic renal insufficiency)    DDD (degenerative disc disease)    Diabetes mellitus    INSULIN DEPENDENT   GERD (gastroesophageal reflux disease)    High cholesterol    Hypertension    Pancreatitis    Proteinuria    Past Surgical History:  Procedure Laterality Date   ABDOMINAL HYSTERECTOMY     ANGIOPLASTY     ANKLE SURGERY Right    BACK SURGERY     INCISION AND DRAINAGE ABSCESS N/A 11/09/2018   Procedure: INCISION AND DRAINAGE ABSCESS;  Surgeon: Coralie Keens, MD;  Location: [facility];  Service: General;  Laterality: N/A;   TONSILLECTOMY     Current Outpatient Medications on File Prior to Visit  Medication Sig Dispense Refill   acetaminophen (TYLENOL) 325 MG tablet Take 2 tablets (650 mg total) by mouth every 6 (six) hours as needed for mild pain (or Fever >/= 101).     amLODipine (NORVASC) 10 MG tablet Take 10 mg by mouth daily.     aspirin 81 MG  tablet Take 81 mg by mouth daily.     B-D ULTRAFINE III SHORT PEN 31G X 8 MM MISC USE WITH LANTUS. 100 each 0   calcitRIOL (ROCALTROL) 0.25 MCG capsule Take 0.25 mcg by mouth daily.     cloNIDine (CATAPRES) 0.1 MG tablet Take 0.1 mg by mouth 2 (two) times daily.      colchicine 0.6 MG tablet TAKE ONE TABLET BY MOUTH DAILY. 90 tablet 0   collagenase (SANTYL) ointment Apply topically daily. 15 g 0   cyclobenzaprine (FLEXERIL) 5 MG tablet Take 1 tablet (5 mg total) by mouth 3 (three) times daily as needed for muscle spasms. 30 tablet 1   diltiazem (CARDIZEM CD) 300 MG 24 hr capsule Take 1 capsule (300 mg total) by mouth daily. 30 capsule 0   diltiazem (TIAZAC) 300 MG 24 hr capsule Take 300 mg by mouth daily.     famotidine (PEPCID) 40 MG tablet Take 40 mg by mouth at bedtime.     febuxostat (ULORIC) 40 MG tablet Take 1 tablet (40 mg total) by mouth daily. 30 tablet 0   furosemide (LASIX) 40 MG tablet TAKE 2 TABLETS IN THE MORNING AND 1 TABLET IN THE EVENING. 270 tablet 1   HYDROcodone bit-homatropine (HYCODAN) 5-1.5 MG/5ML syrup Take 5 mLs  by mouth every 8 (eight) hours as needed for cough. (Patient not taking: Reported on 03/26/2021) 120 mL 0   insulin glargine (LANTUS) 100 UNIT/ML injection Inject 0.32 mLs (32 Units total) into the skin daily. 10 mL 11   insulin lispro (HUMALOG) 100 UNIT/ML injection Inject 0.1 mLs (10 Units total) into the skin daily with lunch. 10 mL 11   Iron, Ferrous Sulfate, 325 (65 Fe) MG TABS Take 325 mg by mouth 2 (two) times daily. 60 tablet 2   LANTUS SOLOSTAR 100 UNIT/ML Solostar Pen INJECT 0.32ML (32 UNITS) INTO THE SKIN DAILY. 15 mL 11   levothyroxine (SYNTHROID) 50 MCG tablet TAKE 1 TABLET BY MOUTH  DAILY 90 tablet 3   metoprolol succinate (TOPROL-XL) 50 MG 24 hr tablet TAKE 1 TABLET BY MOUTH  DAILY WITH OR IMMEDIATELY  FOLLOWING A MEAL 90 tablet 3   omeprazole (PRILOSEC) 40 MG capsule Take 40 mg by mouth every morning.     oxyCODONE (OXY IR/ROXICODONE) 5 MG immediate  release tablet Take 1 tablet (5 mg total) by mouth every 6 (six) hours as needed for moderate pain. (Patient not taking: Reported on 03/26/2021) 30 tablet 0   pantoprazole (PROTONIX) 40 MG tablet Take 1 tablet (40 mg total) by mouth daily at 6 (six) AM. 30 tablet 0   polyethylene glycol (MIRALAX / GLYCOLAX) 17 g packet Take 17 g by mouth daily. 14 each 0   pravastatin (PRAVACHOL) 80 MG tablet Take 1 tablet (80 mg total) by mouth at bedtime. 90 tablet 1   senna (SENOKOT) 8.6 MG TABS tablet Take 2 tablets (17.2 mg total) by mouth at bedtime. 120 each 0   sodium bicarbonate 650 MG tablet Take 1 tablet (650 mg total) by mouth 4 (four) times daily. 360 tablet 1   sucralfate (CARAFATE) 1 G tablet Take 1 tablet (1 g total) by mouth 4 (four) times daily -  with meals and at bedtime. 120 tablet 0   VELTASSA 8.4 g packet Take 8.4 g by mouth daily.      No current facility-administered medications on file prior to visit.   Allergies  Allergen Reactions   5-Alpha Reductase Inhibitors    Ace Inhibitors    Actos [Pioglitazone Hydrochloride]    Angiotensin Receptor Blockers Hives   Dust Mite Extract    Dye Fdc Red [Erythrosine Red No. 3]    Fish Allergy    Iodinated Contrast Media Hives   Pioglitazone     Other reaction(s): swelling   Social History   Socioeconomic History   Marital status: Widowed    Spouse name: Not on file   Number of children: 0   Years of education: Not on file   Highest education level: Not on file  Occupational History   Occupation: Retired  Tobacco Use   Smoking status: Former   Smokeless tobacco: Never   Tobacco comments:    quit smoking in 1995 per pt.  Substance and Sexual Activity   Alcohol use: No    Alcohol/week: 0.0 standard drinks   Drug use: No   Sexual activity: Not on file  Other Topics Concern   Not on file  Social History Narrative   Widow since 2008   Social Determinants of Health   Financial Resource Strain: Low Risk    Difficulty of Paying  Living Expenses: Not hard at all  Food Insecurity: No Food Insecurity   Worried About Charity fundraiser in the Last Year: Never true   Ran Out  of Food in the Last Year: Never true  Transportation Needs: No Transportation Needs   Lack of Transportation (Medical): No   Lack of Transportation (Non-Medical): No  Physical Activity: Inactive   Days of Exercise per Week: 0 days   Minutes of Exercise per Session: 0 min  Stress: No Stress Concern Present   Feeling of Stress : Not at all  Social Connections: Moderately Integrated   Frequency of Communication with Friends and Family: More than three times a week   Frequency of Social Gatherings with Friends and Family: More than three times a week   Attends Religious Services: More than 4 times per year   Active Member of [employer] or Organizations: Yes   Attends Archivist Meetings: More than 4 times per year   Marital Status: Widowed  Human resources officer Violence: Not At Risk   Fear of Current or Ex-Partner: No   Emotionally Abused: No   Physically Abused: No   Sexually Abused: No      Review of Systems     Objective:   Physical Exam Constitutional:      General: She is not in acute distress.    Appearance: She is obese. She is not ill-appearing or toxic-appearing.  Cardiovascular:     Rate and Rhythm: Normal rate and regular rhythm.     Heart sounds: Normal heart sounds. No murmur heard. Pulmonary:     Effort: Pulmonary effort is normal.     Breath sounds: Normal breath sounds. No wheezing, rhonchi or rales.  Abdominal:     General: Bowel sounds are normal. There is no distension.     Palpations: Abdomen is soft.     Tenderness: There is no abdominal tenderness. There is no guarding or rebound.     Hernia: No hernia is present.  Musculoskeletal:     Right lower leg: No edema.     Left lower leg: No edema.  Neurological:     Mental Status: She is alert.          Assessment & Plan:  Chronic kidney disease (CKD),  stage IV (severe) (HCC) - Plan: Hemoglobin A1c, COMPLETE METABOLIC PANEL WITH GFR  Type II diabetes mellitus with end-stage renal disease ([place]) - Plan: Hemoglobin A1c, COMPLETE METABOLIC PANEL WITH GFR Recommended the patient reduce Lantus to 20 units a day and discontinue Humalog altogether.  She can use Humalog sliding scale with meals.  She will give herself 2 units of Humalog for every 50 points higher than 150.  This will hopefully help reduce the risk of hypoglycemia secondary to too much insulin.  I will also repeat a CMP to monitor her potassium.  She has not been taking her potassium binder.  She may need to resume that based on the results of this lab work.  Check a hemoglobin A1c.

## 2021-07-24 ENCOUNTER — Telehealth: Payer: Self-pay

## 2021-07-24 LAB — COMPLETE METABOLIC PANEL WITH GFR
AG Ratio: 1.4 (calc) (ref 1.0–2.5)
ALT: 17 U/L (ref 6–29)
AST: 19 U/L (ref 10–35)
Albumin: 3.9 g/dL (ref 3.6–5.1)
Alkaline phosphatase (APISO): 157 U/L — ABNORMAL HIGH (ref 37–153)
BUN/Creatinine Ratio: 13 (calc) (ref 6–22)
BUN: 62 mg/dL — ABNORMAL HIGH (ref 7–25)
CO2: 26 mmol/L (ref 20–32)
Calcium: 9.7 mg/dL (ref 8.6–10.4)
Chloride: 105 mmol/L (ref 98–110)
Creat: 4.9 mg/dL — ABNORMAL HIGH (ref 0.60–1.00)
Globulin: 2.7 g/dL (calc) (ref 1.9–3.7)
Glucose, Bld: 110 mg/dL — ABNORMAL HIGH (ref 65–99)
Potassium: 5.1 mmol/L (ref 3.5–5.3)
Sodium: 140 mmol/L (ref 135–146)
Total Bilirubin: 0.5 mg/dL (ref 0.2–1.2)
Total Protein: 6.6 g/dL (ref 6.1–8.1)
eGFR: 9 mL/min/{1.73_m2} — ABNORMAL LOW (ref >=60)

## 2021-07-24 LAB — HEMOGLOBIN A1C
Hgb A1c MFr Bld: 6 %{Hb} — ABNORMAL HIGH
Mean Plasma Glucose: 126 mg/dL
eAG (mmol/L): 7 mmol/L

## 2021-07-24 NOTE — Telephone Encounter (Signed)
Patient aware of results and recommendations. °

## 2021-07-24 NOTE — Telephone Encounter (Signed)
-----   Message from Susy Frizzle, MD sent at 07/24/2021  7:01 AM EST ----- Decrease insulin as we discussed his A1c is low.  Potassium is back to safe levels.

## 2021-07-31 ENCOUNTER — Telehealth: Payer: Self-pay

## 2021-07-31 NOTE — Telephone Encounter (Signed)
Pt called in to give blood sugar readings for this past week:  1/21-- morning 71 lunchtime 119 1/23-- morning 122 lunchtime 315 bedtime 89 1/25-- morning 100 1/26--morning  159 1/27--morning 140  Cb#: [phone]

## 2021-07-31 NOTE — Telephone Encounter (Signed)
ATC pt, no answer and no vm set up

## 2021-09-01 ENCOUNTER — Telehealth: Payer: Self-pay | Admitting: Pharmacist

## 2021-09-01 NOTE — Progress Notes (Signed)
Chronic Care Management Pharmacy Assistant   Name: Emily Hughes  MRN: [id number] DOB: 17-Dec-1940   Reason for Encounter: Disease State - General Adherence Call   Recent office visits:  07/13/21 Emily Luo, MD - Family Medicine - Chronic Kidney Disease - Labs were ordered. diclofenac Sodium (VOLTAREN) 1 % GEL and insulin glargine (LANTUS) 100 UNIT/ML injection Inject 0.32 mLs (32 Units total) into the skin daily prescribed. reduce Lantus to 20 units a day and discontinue Humalog altogether.  She can use Humalog sliding scale with meals.  She will give herself 2 units of Humalog for every 50 points higher than 150.  This will hopefully help reduce the risk of hypoglycemia secondary to too much insulin. Follow up as scheduled.   Recent consult visits:  07/16/21 Emily Guys, MD - Ophthalmology - Diabetic Eye Exam - Routine eye exam performed. Follow up in 1 year.   Hospital visits: 07/21/21 - 07/22/21 Medication Reconciliation was completed by comparing discharge summary, patients EMR and Pharmacy list, and upon discussion with patient.  Admitted to the hospital on 07/21/21 due to Hypoglycemia. Discharge date was 07/22/21. Discharged from [place]?Medications Started at [facility] Discharge:?? None noted.   Medication Changes at [facility] Discharge None noted.  Medications Discontinued at Hospital Discharge: None noted.   Medications that remain the same after Hospital Discharge:??  All other medications will remain the same.    Medications: Outpatient Encounter Medications as of 09/01/2021  Medication Sig   acetaminophen (TYLENOL) 325 MG tablet Take 2 tablets (650 mg total) by mouth every 6 (six) hours as needed for mild pain (or Fever >/= 101).   amLODipine (NORVASC) 10 MG tablet Take 10 mg by mouth daily.   aspirin 81 MG tablet Take 81 mg by mouth daily.   B-D ULTRAFINE III SHORT PEN 31G X 8 MM MISC USE WITH LANTUS.   calcitRIOL (ROCALTROL) 0.25 MCG capsule  Take 0.25 mcg by mouth daily.   cloNIDine (CATAPRES) 0.1 MG tablet Take 0.1 mg by mouth 2 (two) times daily.    colchicine 0.6 MG tablet TAKE ONE TABLET BY MOUTH DAILY.   collagenase (SANTYL) ointment Apply topically daily.   cyclobenzaprine (FLEXERIL) 5 MG tablet Take 1 tablet (5 mg total) by mouth 3 (three) times daily as needed for muscle spasms.   diclofenac Sodium (VOLTAREN) 1 % GEL Apply 2 g topically 4 (four) times daily.   diltiazem (CARDIZEM CD) 300 MG 24 hr capsule Take 1 capsule (300 mg total) by mouth daily.   diltiazem (TIAZAC) 300 MG 24 hr capsule Take 300 mg by mouth daily.   famotidine (PEPCID) 40 MG tablet Take 40 mg by mouth at bedtime.   febuxostat (ULORIC) 40 MG tablet Take 1 tablet (40 mg total) by mouth daily.   furosemide (LASIX) 40 MG tablet TAKE 2 TABLETS IN THE MORNING AND 1 TABLET IN THE EVENING.   HYDROcodone bit-homatropine (HYCODAN) 5-1.5 MG/5ML syrup Take 5 mLs by mouth every 8 (eight) hours as needed for cough.   insulin glargine (LANTUS) 100 UNIT/ML injection Inject 0.32 mLs (32 Units total) into the skin daily.   insulin lispro (HUMALOG) 100 UNIT/ML injection Inject 0.1 mLs (10 Units total) into the skin daily with lunch.   Iron, Ferrous Sulfate, 325 (65 Fe) MG TABS Take 325 mg by mouth 2 (two) times daily.   levothyroxine (SYNTHROID) 50 MCG tablet TAKE 1 TABLET BY MOUTH  DAILY   metoprolol succinate (TOPROL-XL) 50 MG 24 hr tablet  TAKE 1 TABLET BY MOUTH  DAILY WITH OR IMMEDIATELY  FOLLOWING A MEAL   omeprazole (PRILOSEC) 40 MG capsule Take 40 mg by mouth every morning.   oxyCODONE (OXY IR/ROXICODONE) 5 MG immediate release tablet Take 1 tablet (5 mg total) by mouth every 6 (six) hours as needed for moderate pain.   pantoprazole (PROTONIX) 40 MG tablet Take 1 tablet (40 mg total) by mouth daily at 6 (six) AM.   polyethylene glycol (MIRALAX / GLYCOLAX) 17 g packet Take 17 g by mouth daily.   pravastatin (PRAVACHOL) 80 MG tablet Take 1 tablet (80 mg total) by mouth  at bedtime.   senna (SENOKOT) 8.6 MG TABS tablet Take 2 tablets (17.2 mg total) by mouth at bedtime.   sodium bicarbonate 650 MG tablet Take 1 tablet (650 mg total) by mouth 4 (four) times daily.   sucralfate (CARAFATE) 1 G tablet Take 1 tablet (1 g total) by mouth 4 (four) times daily -  with meals and at bedtime.   VELTASSA 8.4 g packet Take 8.4 g by mouth daily.    No facility-administered encounter medications on file as of 09/01/2021.    Have you had any problems recently with your health? Patient denied any recent problems with her health.   Have you had any problems with your pharmacy? Patient denied any problems with her current pharmacy.   What issues or side effects are you having with your medications? Patient denied having any issues or side effects with her current medications.   What would you like me to pass along to Leata Mouse, CPP for them to help you with?  Patient did not have anything to pass along to CPP at this time. She reported she is doing well and is not having any problems. She stated she has not gotten any recent lipids done as she had been having some issues with low blood sugar but that is getting better and she plans to follow up in the next month or so for that.   What can we do to take care of you better? Patient did not have any recommendations at this time. She stated "Im good".  Care Gaps  AWV: done 03/26/21 Colonoscopy: done 07/06/03 DM Eye Exam: due 07/16/22 DM Foot Exam: due 02/06/21 Microalbumin: done 09/22/18 HbgAIC: one 07/23/21 (6.0) DEXA: done 11/15/13 Mammogram: done 04/07/18   Star Rating Drugs: pravastatin (PRAVACHOL) 80 MG tablet - last filled 06/24/21 90 days   Future Appointments  Date Time Provider [place]  04/01/2022 12:00 PM [facility]-NURSE HEALTH ADVISOR [facility] None     Jobe Gibbon, [facility] Clinical Pharmacist Assistant  [phone]

## 2021-09-25 ENCOUNTER — Telehealth: Payer: Self-pay | Admitting: Pharmacist

## 2021-09-25 NOTE — Progress Notes (Signed)
? ? ?Chronic Care Management ?Pharmacy Assistant  ? ?Name: Emily Hughes  MRN: [id number] DOB: 05/19/1941 ? ? ?Reason for Encounter: Disease State - General Adherence Call  ?  ? ?Recent office visits:  ?None noted.  ? ?Recent consult visits:  ?None noted.  ? ?Hospital visits: 07/21/21 - 07/22/21 ?Medication Reconciliation was completed by comparing discharge summary, patient?s EMR and Pharmacy list, and upon discussion with patient. ?  ?Admitted to the hospital on 07/21/21 due to Hypoglycemia. Discharge date was 07/22/21. Discharged from [facility].   ?  ?New?Medications Started at [facility] Discharge:?? ?None noted.  ?  ?Medication Changes at [facility] Discharge ?None noted. ?  ?Medications Discontinued at Hospital Discharge: ?None noted.  ?  ?Medications that remain the same after Hospital Discharge:??  ?All other medications will remain the same  ? ?Medications: ?Outpatient Encounter Medications as of 09/25/2021  ?Medication Sig  ? acetaminophen (TYLENOL) 325 MG tablet Take 2 tablets (650 mg total) by mouth every 6 (six) hours as needed for mild pain (or Fever >/= 101).  ? amLODipine (NORVASC) 10 MG tablet Take 10 mg by mouth daily.  ? aspirin 81 MG tablet Take 81 mg by mouth daily.  ? B-D ULTRAFINE III SHORT PEN 31G X 8 MM MISC USE WITH LANTUS.  ? calcitRIOL (ROCALTROL) 0.25 MCG capsule Take 0.25 mcg by mouth daily.  ? cloNIDine (CATAPRES) 0.1 MG tablet Take 0.1 mg by mouth 2 (two) times daily.   ? colchicine 0.6 MG tablet TAKE ONE TABLET BY MOUTH DAILY.  ? collagenase (SANTYL) ointment Apply topically daily.  ? cyclobenzaprine (FLEXERIL) 5 MG tablet Take 1 tablet (5 mg total) by mouth 3 (three) times daily as needed for muscle spasms.  ? diclofenac Sodium (VOLTAREN) 1 % GEL Apply 2 g topically 4 (four) times daily.  ? diltiazem (CARDIZEM CD) 300 MG 24 hr capsule Take 1 capsule (300 mg total) by mouth daily.  ? diltiazem (TIAZAC) 300 MG 24 hr capsule Take 300 mg by mouth daily.  ? famotidine (PEPCID) 40 MG  tablet Take 40 mg by mouth at bedtime.  ? febuxostat (ULORIC) 40 MG tablet Take 1 tablet (40 mg total) by mouth daily.  ? furosemide (LASIX) 40 MG tablet TAKE 2 TABLETS IN THE MORNING AND 1 TABLET IN THE EVENING.  ? HYDROcodone bit-homatropine (HYCODAN) 5-1.5 MG/5ML syrup Take 5 mLs by mouth every 8 (eight) hours as needed for cough.  ? insulin glargine (LANTUS) 100 UNIT/ML injection Inject 0.32 mLs (32 Units total) into the skin daily.  ? insulin lispro (HUMALOG) 100 UNIT/ML injection Inject 0.1 mLs (10 Units total) into the skin daily with lunch.  ? Iron, Ferrous Sulfate, 325 (65 Fe) MG TABS Take 325 mg by mouth 2 (two) times daily.  ? levothyroxine (SYNTHROID) 50 MCG tablet TAKE 1 TABLET BY MOUTH  DAILY  ? metoprolol succinate (TOPROL-XL) 50 MG 24 hr tablet TAKE 1 TABLET BY MOUTH  DAILY WITH OR IMMEDIATELY  FOLLOWING A MEAL  ? omeprazole (PRILOSEC) 40 MG capsule Take 40 mg by mouth every morning.  ? oxyCODONE (OXY IR/ROXICODONE) 5 MG immediate release tablet Take 1 tablet (5 mg total) by mouth every 6 (six) hours as needed for moderate pain.  ? pantoprazole (PROTONIX) 40 MG tablet Take 1 tablet (40 mg total) by mouth daily at 6 (six) AM.  ? polyethylene glycol (MIRALAX / GLYCOLAX) 17 g packet Take 17 g by mouth daily.  ? pravastatin (PRAVACHOL) 80 MG tablet Take 1 tablet (80 mg total)  by mouth at bedtime.  ? senna (SENOKOT) 8.6 MG TABS tablet Take 2 tablets (17.2 mg total) by mouth at bedtime.  ? sodium bicarbonate 650 MG tablet Take 1 tablet (650 mg total) by mouth 4 (four) times daily.  ? sucralfate (CARAFATE) 1 G tablet Take 1 tablet (1 g total) by mouth 4 (four) times daily -  with meals and at bedtime.  ? VELTASSA 8.4 g packet Take 8.4 g by mouth daily.   ? ?No facility-administered encounter medications on file as of 09/25/2021.  ? ? ?Have you had any problems recently with your health? ?Patient denied any recent problems with her health.  ?  ?Have you had any problems with your pharmacy? ?Patient reported  PCP office sent in her insulin in vials instead of the pens like she normally has. She wanted to make sure this was changed for future use.  ?  ?What issues or side effects are you having with your medications? ?Patient denied having any issues or side effects with her current medications.  ?  ?What would you like me to pass along to Leata Mouse, CPP for them to help you with?  ?Patient did not have anything to pass along to CPP at this time. She reported she is doing well and is not having any problems.  ?  ?What can we do to take care of you better? ?Patient did not have any recommendations at this time. She stated "Im good". ? ? ?Care Gaps ?  ?AWV: done 03/26/21 ?Colonoscopy: done 07/06/03 ?DM Eye Exam: due 07/16/22 ?DM Foot Exam: due 02/06/21 ?Microalbumin: done 09/22/18 ?HbgAIC: one 07/23/21 (6.0) ?DEXA: done 11/15/13 ?Mammogram: done 04/07/18 ?  ?  ?Star Rating Drugs: ?pravastatin (PRAVACHOL) 80 MG tablet - last filled 09/01/21 100 days ? ? ?Future Appointments  ?Date Time Provider [place]  ?04/01/2022 12:00 PM [facility]-NURSE HEALTH ADVISOR [facility]  ? ? ? ?Liza Showfety, CCMA ?Clinical Pharmacist Assistant  ?[phone] ? ?

## 2021-11-11 ENCOUNTER — Other Ambulatory Visit: Payer: Self-pay | Admitting: Family Medicine

## 2021-11-12 NOTE — Telephone Encounter (Signed)
LRF 01/19/21 #90  3 refills. Pt has refill available. Sent via Interface. ?Requested Prescriptions  ?Pending Prescriptions Disp Refills  ?? metoprolol succinate (TOPROL-XL) 50 MG 24 hr tablet [Pharmacy Med Name: Metoprolol Succinate ER 50 MG Oral Tablet Extended Release 24 Hour] 90 tablet 3  ?  Sig: TAKE 1 TABLET BY MOUTH  DAILY WITH OR IMMEDIATELY  FOLLOWING A MEAL  ?  ? Cardiovascular:  Beta Blockers Passed - 11/11/2021 10:48 PM  ?  ?  Passed - Last BP in normal range  ?  BP Readings from Last 1 Encounters:  ?07/23/21 138/68  ?   ?  ?  Passed - Last Heart Rate in normal range  ?  Pulse Readings from Last 1 Encounters:  ?07/23/21 77  ?   ?  ?  Passed - Valid encounter within last 6 months  ?  Recent Outpatient Visits   ?      ? 3 months ago Chronic kidney disease (CKD), stage IV (severe) ([place])  ? [facility] [place], Cammie Mcgee, MD  ? 9 months ago Essential hypertension  ? [facility] [place], Cammie Mcgee, MD  ? 1 year ago Type II diabetes mellitus with end-stage renal disease ([place])  ? [facility], Cammie Mcgee, MD  ? 2 years ago Type II diabetes mellitus with end-stage renal disease ([place])  ? [facility] Pickard, Cammie Mcgee, MD  ? 3 years ago Type II diabetes mellitus with end-stage renal disease ([place])  ? [facility] Pickard, Cammie Mcgee, MD  ?  ?  ? ?  ?  ?  ? ? ? ?

## 2021-11-27 DIAGNOSIS — I120 Hypertensive chronic kidney disease with stage 5 chronic kidney disease or end stage renal disease: Secondary | ICD-10-CM | POA: Diagnosis not present

## 2021-11-27 DIAGNOSIS — E875 Hyperkalemia: Secondary | ICD-10-CM | POA: Diagnosis not present

## 2021-11-27 DIAGNOSIS — E872 Acidosis, unspecified: Secondary | ICD-10-CM | POA: Diagnosis not present

## 2021-11-27 DIAGNOSIS — M109 Gout, unspecified: Secondary | ICD-10-CM | POA: Diagnosis not present

## 2021-11-27 DIAGNOSIS — E1122 Type 2 diabetes mellitus with diabetic chronic kidney disease: Secondary | ICD-10-CM | POA: Diagnosis not present

## 2021-11-27 DIAGNOSIS — N185 Chronic kidney disease, stage 5: Secondary | ICD-10-CM | POA: Diagnosis not present

## 2021-11-27 DIAGNOSIS — N2581 Secondary hyperparathyroidism of renal origin: Secondary | ICD-10-CM | POA: Diagnosis not present

## 2021-11-27 DIAGNOSIS — D631 Anemia in chronic kidney disease: Secondary | ICD-10-CM | POA: Diagnosis not present

## 2021-11-27 DIAGNOSIS — N189 Chronic kidney disease, unspecified: Secondary | ICD-10-CM | POA: Diagnosis not present

## 2022-02-11 ENCOUNTER — Other Ambulatory Visit: Payer: Self-pay | Admitting: Family Medicine

## 2022-02-12 NOTE — Telephone Encounter (Signed)
Pt has refills available, request sent via Interface. LRF 06/15/21 #90  3 refills Requested Prescriptions  Pending Prescriptions Disp Refills  . levothyroxine (SYNTHROID) 50 MCG tablet [Pharmacy Med Name: Levothyroxine Sodium 50 MCG Oral Tablet] 100 tablet 2    Sig: TAKE 1 TABLET BY MOUTH DAILY     Endocrinology:  Hypothyroid Agents Failed - 02/11/2022 10:26 PM      Failed - TSH in normal range and within 360 days    TSH  Date Value Ref Range Status  01/23/2021 2.80 0.40 - 4.50 mIU/L Final         Passed - Valid encounter within last 12 months    Recent Outpatient Visits          6 months ago Chronic kidney disease (CKD), stage IV (severe) ([place])   [place] Pickard, Cammie Mcgee, MD   1 year ago Essential hypertension   [place] Susy Frizzle, MD   2 years ago Type II diabetes mellitus with end-stage renal disease ([place])   [place] Susy Frizzle, MD   3 years ago Type II diabetes mellitus with end-stage renal disease ([place])   [place] Susy Frizzle, MD   3 years ago Type II diabetes mellitus with end-stage renal disease ([place])   [facility] [place], Cammie Mcgee, MD

## 2022-03-19 ENCOUNTER — Emergency Department (HOSPITAL_COMMUNITY): Payer: No Typology Code available for payment source

## 2022-03-19 ENCOUNTER — Encounter (HOSPITAL_COMMUNITY): Payer: Self-pay

## 2022-03-19 ENCOUNTER — Emergency Department (HOSPITAL_COMMUNITY)
Admission: EM | Admit: 2022-03-19 | Discharge: 2022-03-19 | Disposition: A | Discharge status: Home / Self Care | Payer: No Typology Code available for payment source | Attending: Emergency Medicine | Admitting: Emergency Medicine

## 2022-03-19 ENCOUNTER — Other Ambulatory Visit: Payer: Self-pay

## 2022-03-19 DIAGNOSIS — E1122 Type 2 diabetes mellitus with diabetic chronic kidney disease: Secondary | ICD-10-CM | POA: Insufficient documentation

## 2022-03-19 DIAGNOSIS — Y92410 Unspecified street and highway as the place of occurrence of the external cause: Secondary | ICD-10-CM | POA: Diagnosis not present

## 2022-03-19 DIAGNOSIS — S298XXA Other specified injuries of thorax, initial encounter: Secondary | ICD-10-CM | POA: Diagnosis not present

## 2022-03-19 DIAGNOSIS — S161XXA Strain of muscle, fascia and tendon at neck level, initial encounter: Secondary | ICD-10-CM

## 2022-03-19 DIAGNOSIS — I129 Hypertensive chronic kidney disease with stage 1 through stage 4 chronic kidney disease, or unspecified chronic kidney disease: Secondary | ICD-10-CM | POA: Insufficient documentation

## 2022-03-19 DIAGNOSIS — J9811 Atelectasis: Secondary | ICD-10-CM | POA: Diagnosis not present

## 2022-03-19 DIAGNOSIS — Z79899 Other long term (current) drug therapy: Secondary | ICD-10-CM | POA: Diagnosis not present

## 2022-03-19 DIAGNOSIS — Q6102 Congenital multiple renal cysts: Secondary | ICD-10-CM | POA: Diagnosis not present

## 2022-03-19 DIAGNOSIS — K8020 Calculus of gallbladder without cholecystitis without obstruction: Secondary | ICD-10-CM | POA: Insufficient documentation

## 2022-03-19 DIAGNOSIS — Z7982 Long term (current) use of aspirin: Secondary | ICD-10-CM | POA: Insufficient documentation

## 2022-03-19 DIAGNOSIS — S199XXA Unspecified injury of neck, initial encounter: Secondary | ICD-10-CM | POA: Diagnosis not present

## 2022-03-19 DIAGNOSIS — I10 Essential (primary) hypertension: Secondary | ICD-10-CM | POA: Diagnosis not present

## 2022-03-19 DIAGNOSIS — I6782 Cerebral ischemia: Secondary | ICD-10-CM | POA: Insufficient documentation

## 2022-03-19 DIAGNOSIS — N184 Chronic kidney disease, stage 4 (severe): Secondary | ICD-10-CM | POA: Diagnosis not present

## 2022-03-19 DIAGNOSIS — Z87891 Personal history of nicotine dependence: Secondary | ICD-10-CM | POA: Insufficient documentation

## 2022-03-19 DIAGNOSIS — Z743 Need for continuous supervision: Secondary | ICD-10-CM | POA: Diagnosis not present

## 2022-03-19 DIAGNOSIS — I2510 Atherosclerotic heart disease of native coronary artery without angina pectoris: Secondary | ICD-10-CM | POA: Insufficient documentation

## 2022-03-19 DIAGNOSIS — I700 Atherosclerosis of aorta: Secondary | ICD-10-CM | POA: Diagnosis not present

## 2022-03-19 DIAGNOSIS — N200 Calculus of kidney: Secondary | ICD-10-CM | POA: Insufficient documentation

## 2022-03-19 DIAGNOSIS — N281 Cyst of kidney, acquired: Secondary | ICD-10-CM | POA: Diagnosis not present

## 2022-03-19 DIAGNOSIS — S3991XA Unspecified injury of abdomen, initial encounter: Secondary | ICD-10-CM | POA: Diagnosis not present

## 2022-03-19 DIAGNOSIS — M542 Cervicalgia: Secondary | ICD-10-CM | POA: Diagnosis not present

## 2022-03-19 DIAGNOSIS — S0990XA Unspecified injury of head, initial encounter: Secondary | ICD-10-CM | POA: Diagnosis not present

## 2022-03-19 LAB — COMPREHENSIVE METABOLIC PANEL
ALT: 9 U/L (ref 0–44)
AST: 17 U/L (ref 15–41)
Albumin: 3.8 g/dL (ref 3.5–5.0)
Alkaline Phosphatase: 161 U/L — ABNORMAL HIGH (ref 38–126)
Anion gap: 14 (ref 5–15)
BUN: 61 mg/dL — ABNORMAL HIGH (ref 8–23)
CO2: 19 mmol/L — ABNORMAL LOW (ref 22–32)
Calcium: 9.6 mg/dL (ref 8.9–10.3)
Chloride: 104 mmol/L (ref 98–111)
Creatinine, Ser: 4.86 mg/dL — ABNORMAL HIGH (ref 0.44–1.00)
GFR, Estimated: 9 mL/min — ABNORMAL LOW (ref >60)
Glucose, Bld: 199 mg/dL — ABNORMAL HIGH (ref 70–99)
Potassium: 4.5 mmol/L (ref 3.5–5.1)
Sodium: 137 mmol/L (ref 135–145)
Total Bilirubin: <0.1 mg/dL — ABNORMAL LOW (ref 0.3–1.2)
Total Protein: 6.9 g/dL (ref 6.5–8.1)

## 2022-03-19 LAB — CBC
HCT: 27.8 % — ABNORMAL LOW (ref 36.0–46.0)
Hemoglobin: 8.7 g/dL — ABNORMAL LOW (ref 12.0–15.0)
MCH: 26.9 pg (ref 26.0–34.0)
MCHC: 31.3 g/dL (ref 30.0–36.0)
MCV: 86.1 fL (ref 80.0–100.0)
Platelets: 249 10*3/uL (ref 150–400)
RBC: 3.23 MIL/uL — ABNORMAL LOW (ref 3.87–5.11)
RDW: 15.7 % — ABNORMAL HIGH (ref 11.5–15.5)
WBC: 6.2 10*3/uL (ref 4.0–10.5)
nRBC: 0 % (ref 0.0–0.2)

## 2022-03-19 LAB — I-STAT CHEM 8, ED
BUN: 55 mg/dL — ABNORMAL HIGH (ref 8–23)
Calcium, Ion: 1.08 mmol/L — ABNORMAL LOW (ref 1.15–1.40)
Chloride: 109 mmol/L (ref 98–111)
Creatinine, Ser: 5.3 mg/dL — ABNORMAL HIGH (ref 0.44–1.00)
Glucose, Bld: 196 mg/dL — ABNORMAL HIGH (ref 70–99)
HCT: 28 % — ABNORMAL LOW (ref 36.0–46.0)
Hemoglobin: 9.5 g/dL — ABNORMAL LOW (ref 12.0–15.0)
Potassium: 4.4 mmol/L (ref 3.5–5.1)
Sodium: 138 mmol/L (ref 135–145)
TCO2: 22 mmol/L (ref 22–32)

## 2022-03-19 LAB — PROTIME-INR
INR: 1.1 (ref 0.8–1.2)
Prothrombin Time: 13.7 seconds (ref 11.4–15.2)
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
## 2022-03-19 NOTE — ED Notes (Signed)
All discharge instructions including follow up care reviewed with patient and patient verbalized understanding of same. Patient stable at time of discharge and wheel chair was provided to patient prior to leaving department.  

## 2022-03-31 ENCOUNTER — Ambulatory Visit (INDEPENDENT_AMBULATORY_CARE_PROVIDER_SITE_OTHER): Payer: Medicare Other

## 2022-03-31 VITALS — BP 140/70 | HR 78 | Temp 98.5°F | Ht 63.0 in | Wt 220.0 lb

## 2022-03-31 DIAGNOSIS — Z0000 Encounter for general adult medical examination without abnormal findings: Secondary | ICD-10-CM | POA: Diagnosis not present

## 2022-03-31 NOTE — Patient Instructions (Signed)
Emily Hughes , Thank you for taking time to come for your Medicare Wellness Visit. I appreciate your ongoing commitment to your health goals. Please review the following plan we discussed and let me know if I can assist you in the future.   These are the goals we discussed:  Goals      Exercise 150 min/wk Moderate Activity     Exercise 3x per week (30 min per time)     Pt has recumbent bike that she wants to ride more.      Track and Manage My Blood Pressure-Hypertension     Timeframe:  Long-Range Goal Priority:  High Start Date: 12/19/20                            Expected End Date:  06/20/21                     Follow Up Date 04/03/21    - check blood pressure 3 times per week - choose a place to take my blood pressure (home, clinic or office, retail store) - write blood pressure results in a log or diary    Why is this important?   You won't feel high blood pressure, but it can still hurt your blood vessels.  High blood pressure can cause heart or kidney problems. It can also cause a stroke.  Making lifestyle changes like losing a little weight or eating less salt will help.  Checking your blood pressure at home and at different times of the day can help to control blood pressure.  If the doctor prescribes medicine remember to take it the way the doctor ordered.  Call the office if you cannot afford the medicine or if there are questions about it.     Notes:         This is a list of the screening recommended for you and due dates:  Health Maintenance  Topic Date Due   Tetanus Vaccine  Never done   Zoster (Shingles) Vaccine (1 of 2) Never done   COVID-19 Vaccine (4 - Pfizer series) 08/28/2020   Hemoglobin A1C  01/20/2022   Flu Shot  02/02/2022   Eye exam for diabetics  07/16/2022   Complete foot exam   09/01/2022   Pneumonia Vaccine  Completed   DEXA scan (bone density measurement)  Completed   Hepatitis C Screening: USPSTF Recommendation to screen - Ages 64-79 yo.   Completed   HPV Vaccine  Aged Out    Advanced directives: Please bring a copy of your health care power of attorney and living will to the office to be added to your chart at your convenience.   Conditions/risks identified: Aim for 30 minutes of exercise or brisk walking, 6-8 glasses of water, and 5 servings of fruits and vegetables each day.   Next appointment: Follow up in one year for your annual wellness visit 03/2023.   Preventive Care 2 Years and Older, Female Preventive care refers to lifestyle choices and visits with your health care provider that can promote health and wellness. What does preventive care include? A yearly physical exam. This is also called an annual well check. Dental exams once or twice a year. Routine eye exams. Ask your health care provider how often you should have your eyes checked. Personal lifestyle choices, including: Daily care of your teeth and gums. Regular physical activity. Eating a healthy diet. Avoiding tobacco and drug use.  Limiting alcohol use. Practicing safe sex. Taking low-dose aspirin every day. Taking vitamin and mineral supplements as recommended by your health care provider. What happens during an annual well check? The services and screenings done by your health care provider during your annual well check will depend on your age, overall health, lifestyle risk factors, and family history of disease. Counseling  Your health care provider may ask you questions about your: Alcohol use. Tobacco use. Drug use. Emotional well-being. Home and relationship well-being. Sexual activity. Eating habits. History of falls. Memory and ability to understand (cognition). Work and work Statistician. Reproductive health. Screening  You may have the following tests or measurements: Height, weight, and BMI. Blood pressure. Lipid and cholesterol levels. These may be checked every 5 years, or more frequently if you are over 67 years  old. Skin check. Lung cancer screening. You may have this screening every year starting at age 63 if you have a 30-pack-year history of smoking and currently smoke or have quit within the past 15 years. Fecal occult blood test (FOBT) of the stool. You may have this test every year starting at age 7. Flexible sigmoidoscopy or colonoscopy. You may have a sigmoidoscopy every 5 years or a colonoscopy every 10 years starting at age 34. Hepatitis C blood test. Hepatitis B blood test. Sexually transmitted disease (STD) testing. Diabetes screening. This is done by checking your blood sugar (glucose) after you have not eaten for a while (fasting). You may have this done every 1-3 years. Bone density scan. This is done to screen for osteoporosis. You may have this done starting at age 61. Mammogram. This may be done every 1-2 years. Talk to your health care provider about how often you should have regular mammograms. Talk with your health care provider about your test results, treatment options, and if necessary, the need for more tests. Vaccines  Your health care provider may recommend certain vaccines, such as: Influenza vaccine. This is recommended every year. Tetanus, diphtheria, and acellular pertussis (Tdap, Td) vaccine. You may need a Td booster every 10 years. Zoster vaccine. You may need this after age 57. Pneumococcal 13-valent conjugate (PCV13) vaccine. One dose is recommended after age 33. Pneumococcal polysaccharide (PPSV23) vaccine. One dose is recommended after age 1. Talk to your health care provider about which screenings and vaccines you need and how often you need them. This information is not intended to replace advice given to you by your health care provider. Make sure you discuss any questions you have with your health care provider. Document Released: 07/18/2015 Document Revised: 03/10/2016 Document Reviewed: 04/22/2015 Elsevier Interactive Patient Education  2017 [place] Prevention in the Home Falls can cause injuries. They can happen to people of all ages. There are many things you can do to make your home safe and to help prevent falls. What can I do on the outside of my home? Regularly fix the edges of walkways and driveways and fix any cracks. Remove anything that might make you trip as you walk through a door, such as a raised step or threshold. Trim any bushes or trees on the path to your home. Use bright outdoor lighting. Clear any walking paths of anything that might make someone trip, such as rocks or tools. Regularly check to see if handrails are loose or broken. Make sure that both sides of any steps have handrails. Any raised decks and porches should have guardrails on the edges. Have any leaves, snow, or ice cleared regularly.  Use sand or salt on walking paths during winter. Clean up any spills in your garage right away. This includes oil or grease spills. What can I do in the bathroom? Use night lights. Install grab bars by the toilet and in the tub and shower. Do not use towel bars as grab bars. Use non-skid mats or decals in the tub or shower. If you need to sit down in the shower, use a plastic, non-slip stool. Keep the floor dry. Clean up any water that spills on the floor as soon as it happens. Remove soap buildup in the tub or shower regularly. Attach bath mats securely with double-sided non-slip rug tape. Do not have throw rugs and other things on the floor that can make you trip. What can I do in the bedroom? Use night lights. Make sure that you have a light by your bed that is easy to reach. Do not use any sheets or blankets that are too big for your bed. They should not hang down onto the floor. Have a firm chair that has side arms. You can use this for support while you get dressed. Do not have throw rugs and other things on the floor that can make you trip. What can I do in the kitchen? Clean up any spills right  away. Avoid walking on wet floors. Keep items that you use a lot in easy-to-reach places. If you need to reach something above you, use a strong step stool that has a grab bar. Keep electrical cords out of the way. Do not use floor polish or wax that makes floors slippery. If you must use wax, use non-skid floor wax. Do not have throw rugs and other things on the floor that can make you trip. What can I do with my stairs? Do not leave any items on the stairs. Make sure that there are handrails on both sides of the stairs and use them. Fix handrails that are broken or loose. Make sure that handrails are as long as the stairways. Check any carpeting to make sure that it is firmly attached to the stairs. Fix any carpet that is loose or worn. Avoid having throw rugs at the top or bottom of the stairs. If you do have throw rugs, attach them to the floor with carpet tape. Make sure that you have a light switch at the top of the stairs and the bottom of the stairs. If you do not have them, ask someone to add them for you. What else can I do to help prevent falls? Wear shoes that: Do not have high heels. Have rubber bottoms. Are comfortable and fit you well. Are closed at the toe. Do not wear sandals. If you use a stepladder: Make sure that it is fully opened. Do not climb a closed stepladder. Make sure that both sides of the stepladder are locked into place. Ask someone to hold it for you, if possible. Clearly mark and make sure that you can see: Any grab bars or handrails. First and last steps. Where the edge of each step is. Use tools that help you move around (mobility aids) if they are needed. These include: Canes. Walkers. Scooters. Crutches. Turn on the lights when you go into a dark area. Replace any light bulbs as soon as they burn out. Set up your furniture so you have a clear path. Avoid moving your furniture around. If any of your floors are uneven, fix them. If there are any  pets around you,  be aware of where they are. Review your medicines with your doctor. Some medicines can make you feel dizzy. This can increase your chance of falling. Ask your doctor what other things that you can do to help prevent falls. This information is not intended to replace advice given to you by your health care provider. Make sure you discuss any questions you have with your health care provider. Document Released: 04/17/2009 Document Revised: 11/27/2015 Document Reviewed: 07/26/2014 Elsevier Interactive Patient Education  2017 [employer].

## 2022-03-31 NOTE — Progress Notes (Unsigned)
Subjective:   Emily Hughes is a 81 y.o. female who presents for Medicare Annual (Subsequent) preventive examination.  Review of Systems     Cardiac Risk Factors include: advanced age (>42mn, >>58women);diabetes mellitus;dyslipidemia;hypertension;sedentary lifestyle;obesity (BMI >30kg/m2)     Objective:    Today's Vitals   03/31/22 0937 03/31/22 0940  BP: (!) 140/70   Pulse: 78   Temp: 98.5 F (36.9 C)   SpO2: 100%   Weight: 220 lb (99.8 kg)   Height: '5\' 3"'$  (1.6 m)   PainSc:  5    Body mass index is 38.97 kg/m.     03/31/2022    9:51 AM 03/19/2022    7:47 PM 07/21/2021    7:31 PM 03/26/2021   12:15 PM 11/09/2018    1:18 AM 11/08/2018    2:51 PM 11/08/2018    2:50 PM  Advanced Directives  Does Patient Have a Medical Advance Directive? No No No No  No No  Would patient like information on creating a medical advance directive? No - Patient declined No - Patient declined No - Patient declined No - Patient declined No - Patient declined      Current Medications (verified) Outpatient Encounter Medications as of 03/31/2022  Medication Sig   acetaminophen (TYLENOL) 325 MG tablet Take 2 tablets (650 mg total) by mouth every 6 (six) hours as needed for mild pain (or Fever >/= 101).   amLODipine (NORVASC) 10 MG tablet Take 10 mg by mouth daily.   aspirin 81 MG tablet Take 81 mg by mouth daily.   B-D ULTRAFINE III SHORT PEN 31G X 8 MM MISC USE WITH LANTUS.   calcitRIOL (ROCALTROL) 0.25 MCG capsule Take 0.25 mcg by mouth daily.   cloNIDine (CATAPRES) 0.1 MG tablet Take 0.1 mg by mouth 2 (two) times daily.    colchicine 0.6 MG tablet TAKE ONE TABLET BY MOUTH DAILY.   collagenase (SANTYL) ointment Apply topically daily.   cyclobenzaprine (FLEXERIL) 5 MG tablet Take 1 tablet (5 mg total) by mouth 3 (three) times daily as needed for muscle spasms.   diclofenac Sodium (VOLTAREN) 1 % GEL Apply 2 g topically 4 (four) times daily.   diltiazem (CARDIZEM CD) 300 MG 24 hr capsule Take 1  capsule (300 mg total) by mouth daily.   diltiazem (TIAZAC) 300 MG 24 hr capsule Take 300 mg by mouth daily.   famotidine (PEPCID) 40 MG tablet Take 40 mg by mouth at bedtime.   febuxostat (ULORIC) 40 MG tablet Take 1 tablet (40 mg total) by mouth daily.   furosemide (LASIX) 40 MG tablet TAKE 2 TABLETS IN THE MORNING AND 1 TABLET IN THE EVENING.   HYDROcodone bit-homatropine (HYCODAN) 5-1.5 MG/5ML syrup Take 5 mLs by mouth every 8 (eight) hours as needed for cough.   insulin glargine (LANTUS) 100 UNIT/ML injection Inject 0.32 mLs (32 Units total) into the skin daily.   insulin lispro (HUMALOG) 100 UNIT/ML injection Inject 0.1 mLs (10 Units total) into the skin daily with lunch.   Iron, Ferrous Sulfate, 325 (65 Fe) MG TABS Take 325 mg by mouth 2 (two) times daily.   levothyroxine (SYNTHROID) 50 MCG tablet TAKE 1 TABLET BY MOUTH  DAILY   metoprolol succinate (TOPROL-XL) 50 MG 24 hr tablet TAKE 1 TABLET BY MOUTH  DAILY WITH OR IMMEDIATELY  FOLLOWING A MEAL   omeprazole (PRILOSEC) 40 MG capsule Take 40 mg by mouth every morning.   oxyCODONE (OXY IR/ROXICODONE) 5 MG immediate release tablet Take 1 tablet (5  mg total) by mouth every 6 (six) hours as needed for moderate pain.   pantoprazole (PROTONIX) 40 MG tablet Take 1 tablet (40 mg total) by mouth daily at 6 (six) AM.   polyethylene glycol (MIRALAX / GLYCOLAX) 17 g packet Take 17 g by mouth daily.   pravastatin (PRAVACHOL) 80 MG tablet Take 1 tablet (80 mg total) by mouth at bedtime.   senna (SENOKOT) 8.6 MG TABS tablet Take 2 tablets (17.2 mg total) by mouth at bedtime.   sodium bicarbonate 650 MG tablet Take 1 tablet (650 mg total) by mouth 4 (four) times daily.   sucralfate (CARAFATE) 1 G tablet Take 1 tablet (1 g total) by mouth 4 (four) times daily -  with meals and at bedtime.   VELTASSA 8.4 g packet Take 8.4 g by mouth daily.    No facility-administered encounter medications on file as of 03/31/2022.    Allergies (verified) 5-alpha  reductase inhibitors, Ace inhibitors, Actos [pioglitazone hydrochloride], Angiotensin receptor blockers, Dust mite extract, Dye fdc red [erythrosine red no. 3], Fish allergy, Iodinated contrast media, and Pioglitazone   History: Past Medical History:  Diagnosis Date   Anemia    CAD (coronary artery disease)    Colon polyps    CRI (chronic renal insufficiency)    DDD (degenerative disc disease)    Diabetes mellitus    INSULIN DEPENDENT   GERD (gastroesophageal reflux disease)    High cholesterol    Hypertension    Pancreatitis    Proteinuria    Past Surgical History:  Procedure Laterality Date   ABDOMINAL HYSTERECTOMY     ANGIOPLASTY     ANKLE SURGERY Right    BACK SURGERY     INCISION AND DRAINAGE ABSCESS N/A 11/09/2018   Procedure: INCISION AND DRAINAGE ABSCESS;  Surgeon: Coralie Keens, MD;  Location: [facility];  Service: General;  Laterality: N/A;   TONSILLECTOMY     Family History  Problem Relation Age of Onset   Diabetes Mother    Diabetes Father    Diabetes Sister    Social History   Socioeconomic History   Marital status: Widowed    Spouse name: Not on file   Number of children: 0   Years of education: Not on file   Highest education level: Not on file  Occupational History   Occupation: Retired  Tobacco Use   Smoking status: Former   Smokeless tobacco: Never   Tobacco comments:    quit smoking in 1995 per pt.  Substance and Sexual Activity   Alcohol use: No    Alcohol/week: 0.0 standard drinks of alcohol   Drug use: No   Sexual activity: Not on file  Other Topics Concern   Not on file  Social History Narrative   Widow since 2008   Social Determinants of Health   Financial Resource Strain: Low Risk  (03/31/2022)   Overall Financial Resource Strain (CARDIA)    Difficulty of Paying Living Expenses: Not hard at all  Food Insecurity: No Food Insecurity (03/31/2022)   Hunger Vital Sign    Worried About Running Out of Food in the Last Year: Never true     Ran Out of Food in the Last Year: Never true  Transportation Needs: No Transportation Needs (03/31/2022)   PRAPARE - Hydrologist (Medical): No    Lack of Transportation (Non-Medical): No  Physical Activity: Inactive (03/31/2022)   Exercise Vital Sign    Days of Exercise per Week: 0 days  Minutes of Exercise per Session: 0 min  Stress: No Stress Concern Present (03/31/2022)   [place]    Feeling of Stress : Not at all  Social Connections: Moderately Integrated (03/31/2022)   Social Connection and Isolation Panel [NHANES]    Frequency of Communication with Friends and Family: More than three times a week    Frequency of Social Gatherings with Friends and Family: More than three times a week    Attends Religious Services: More than 4 times per year    Active Member of [employer] or Organizations: Yes    Attends Archivist Meetings: Never    Marital Status: Never married    Tobacco Counseling Counseling given: Not Answered Tobacco comments: quit smoking in 1995 per pt.   Clinical Intake:  Pre-visit preparation completed: Yes  Pain : 0-10 Pain Score: 5  Pain Type: Chronic pain, Acute pain Pain Location: Back Pain Descriptors / Indicators: Aching Pain Onset: 1 to 4 weeks ago Pain Frequency: Intermittent     BMI - recorded: 38.97 Nutritional Status: BMI > 30  Obese Nutritional Risks: None Diabetes: Yes  How often do you need to have someone help you when you read instructions, pamphlets, or other written materials from your doctor or pharmacy?: 1 - Never  Diabetic? yes  Interpreter Needed?: No  Information entered by :: mj Mckaila Duffus, lpn   Activities of Daily Living    03/31/2022   11:03 AM 03/31/2022    9:52 AM  In your present state of health, do you have any difficulty performing the following activities:  Hearing? 0 0  Vision? 1 1  Comment GLASSES. DR. Gershon Crane  Readers. Dr. Gershon Crane  Difficulty concentrating or making decisions? 0 0  Walking or climbing stairs? 0 0  Dressing or bathing? 0 0  Doing errands, shopping? 0 0  Preparing Food and eating ? N N  Using the Toilet? N N  In the past six months, have you accidently leaked urine? N N  Do you have problems with loss of bowel control? N N  Managing your Medications? N N  Managing your Finances? N N  Housekeeping or managing your Housekeeping? N N   Nutrition Risk Assessment:  Has the patient had any N/V/D within the last 2 months?  No  Does the patient have any non-healing wounds?  No  Has the patient had any unintentional weight loss or weight gain?  No   Diabetes:  Is the patient diabetic?  Yes  If diabetic, was a CBG obtained today?  No  Did the patient bring in their glucometer from home?  No  How often do you monitor your CBG's? Q am.   Financial Strains and Diabetes Management:  Are you having any financial strains with the device, your supplies or your medication? No .  Does the patient want to be seen by Chronic Care Management for management of their diabetes?  No  Would the patient like to be referred to a Nutritionist or for Diabetic Management?  No   Diabetic Exams:  Diabetic Eye Exam: Completed 2023. Diabetic Foot Exam: Completed 02/20223.    Patient Care Team: Susy Frizzle, MD as PCP - General (Family Medicine) Edythe Clarity, [facility] as Pharmacist (Pharmacist)  Indicate any recent Medical Services you may have received from other than [facility] providers in the past year (date may be approximate).     Assessment:   This is a routine wellness examination for  [place].  Hearing/Vision screen No results found.  Dietary issues and exercise activities discussed: Current Exercise Habits: The patient does not participate in regular exercise at present, Exercise limited by: cardiac condition(s);orthopedic condition(s)   Goals Addressed             This  Visit's Progress    Exercise 150 min/wk Moderate Activity         Depression Screen    03/31/2022    9:46 AM 03/26/2021   12:09 PM  PHQ 2/9 Scores  PHQ - 2 Score 0 0    Fall Risk    03/31/2022    9:51 AM 03/26/2021   12:15 PM 08/03/2018    8:29 AM 12/23/2017    8:30 AM 06/24/2017    8:20 AM  Fall Risk   Falls in the past year? 0 1 0 No No  Number falls in past yr: 0 1 0    Injury with Fall? 0 0 0    Risk for fall due to : Impaired balance/gait;Impaired mobility Impaired balance/gait;Impaired mobility;Impaired vision     Follow up Falls prevention discussed Falls prevention discussed       FALL RISK PREVENTION PERTAINING TO THE HOME:  Any stairs in or around the home? No  If so, are there any without handrails? No  Home free of loose throw rugs in walkways, pet beds, electrical cords, etc? Yes  Adequate lighting in your home to reduce risk of falls? Yes   ASSISTIVE DEVICES UTILIZED TO PREVENT FALLS:  Life alert? No  Use of a cane, walker or w/c? Yes  Grab bars in the bathroom? Yes  Shower chair or bench in shower? No  Elevated toilet seat or a handicapped toilet? Yes   TIMED UP AND GO:  Was the test performed? No .  Pt in wheelchair.   Cognitive Function:    07/24/2015    9:20 AM  MMSE - Mini Mental State Exam  Orientation to time 5  Orientation to Place 5  Registration 3  Attention/ Calculation 5  Recall 1  Language- name 2 objects 2  Language- repeat 1  Language- follow 3 step command 2  Language- read & follow direction 1  Write a sentence 1  Copy design 1  Total score 27        03/31/2022    9:52 AM 03/26/2021   12:24 PM  6CIT Screen  What Year? 0 points 0 points  What month? 0 points 0 points  What time? 0 points 0 points  Count back from 20 0 points 0 points  Months in reverse 0 points 0 points  Repeat phrase 0 points 0 points  Total Score 0 points 0 points    Immunizations Immunization History  Administered Date(s) Administered    Influenza Whole 06/28/2005, 04/28/2010   Influenza, High Dose Seasonal PF 03/30/2017, 04/12/2018   Influenza,inj,Quad PF,6+ Mos 03/27/2013, 04/08/2014, 03/27/2015, 03/30/2016   Influenza-Unspecified 03/26/2021   Moderna Sars-Covid-2 Vaccination 07/03/2020   PFIZER(Purple Top)SARS-COV-2 Vaccination 08/28/2019, 09/18/2019   Pneumococcal Conjugate-13 04/08/2014   Pneumococcal Polysaccharide-23 02/04/2011, 11/17/2018   Zoster, Live 04/17/2013    TDAP status: Due, Education has been provided regarding the importance of this vaccine. Advised may receive this vaccine at local pharmacy or Health Dept. Aware to provide a copy of the vaccination record if obtained from local pharmacy or Health Dept. Verbalized acceptance and understanding.  Flu Vaccine status: Due, Education has been provided regarding the importance of this vaccine. Advised may receive this vaccine  at local pharmacy or Health Dept. Aware to provide a copy of the vaccination record if obtained from local pharmacy or Health Dept. Verbalized acceptance and understanding.  Pneumococcal vaccine status: Up to date  Covid-19 vaccine status: Completed vaccines  Qualifies for Shingles Vaccine? Yes   Zostavax completed Yes   Shingrix Completed?: No.    Education has been provided regarding the importance of this vaccine. Patient has been advised to call insurance company to determine out of pocket expense if they have not yet received this vaccine. Advised may also receive vaccine at local pharmacy or Health Dept. Verbalized acceptance and understanding.  Screening Tests Health Maintenance  Topic Date Due   INFLUENZA VACCINE  07/04/2022 (Originally 02/02/2022)   HEMOGLOBIN A1C  07/04/2022 (Originally 01/20/2022)   COVID-19 Vaccine (4 - Pfizer series) 07/04/2022 (Originally 08/28/2020)   Zoster Vaccines- Shingrix (1 of 2) 07/04/2022 (Originally 10/18/1992)   TETANUS/TDAP  04/01/2023 (Originally 10/18/1961)   OPHTHALMOLOGY EXAM  07/16/2022    FOOT EXAM  09/01/2022   Pneumonia Vaccine 33+ Years old  Completed   DEXA SCAN  Completed   Hepatitis C Screening  Completed   HPV VACCINES  Aged Out    Health Maintenance  There are no preventive care reminders to display for this patient.   Colorectal cancer screening: No longer required.   Mammogram status: No longer required due to Pt declined..  Bone Density status: Completed 11/15/2012. Results reflect: Bone density results: NORMAL. Repeat every 2 years.  Lung Cancer Screening: (Low Dose CT Chest recommended if Age 75-80 years, 30 pack-year currently smoking OR have quit w/in 15years.) does not qualify.   Lung Cancer Screening Referral: N/A  Additional Screening:  Hepatitis C Screening: does qualify; Completed 11/11/2018  Vision Screening: Recommended annual ophthalmology exams for early detection of glaucoma and other disorders of the eye. Is the patient up to date with their annual eye exam?  Yes  Who is the provider or what is the name of the office in which the patient attends annual eye exams? Dr. Gershon Crane If pt is not established with a provider, would they like to be referred to a provider to establish care? No .   Dental Screening: Recommended annual dental exams for proper oral hygiene  Community Resource Referral / Chronic Care Management: CRR required this visit?  No   CCM required this visit?  No      Plan:     I have personally reviewed and noted the following in the patient's chart:   Medical and social history Use of alcohol, tobacco or illicit drugs  Current medications and supplements including opioid prescriptions. Patient is not currently taking opioid prescriptions. Functional ability and status Nutritional status Physical activity Advanced directives List of other physicians Hospitalizations, surgeries, and ER visits in previous 12 months Vitals Screenings to include cognitive, depression, and falls Referrals and appointments  In  addition, I have reviewed and discussed with patient certain preventive protocols, quality metrics, and best practice recommendations. A written personalized care plan for preventive services as well as general preventive health recommendations were provided to patient.     Chriss Driver, LPN   3/79/0240   Nurse Notes: Discussed Shingrix and Covid vaccines and how to obtain. Pt states she will received flu vaccine at visit on Friday.

## 2022-04-01 ENCOUNTER — Ambulatory Visit: Payer: Self-pay

## 2022-04-02 ENCOUNTER — Ambulatory Visit (INDEPENDENT_AMBULATORY_CARE_PROVIDER_SITE_OTHER): Payer: Medicare Other | Admitting: Family Medicine

## 2022-04-02 VITALS — BP 132/82 | HR 72 | Wt 220.0 lb

## 2022-04-02 DIAGNOSIS — L03116 Cellulitis of left lower limb: Secondary | ICD-10-CM | POA: Diagnosis not present

## 2022-04-02 MED ORDER — CEPHALEXIN 500 MG PO CAPS: 500.0000 mg | ORAL_CAPSULE | Freq: Four times a day (QID) | ORAL | 0 refills | Status: DC
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
## 2022-04-16 ENCOUNTER — Other Ambulatory Visit: Payer: Self-pay | Admitting: Family Medicine

## 2022-04-19 NOTE — Telephone Encounter (Signed)
Requested medications are due for refill today.  no  Requested medications are on the active medications list.  yes  Last refill. 06/15/2021 #90 3 rf  Future visit scheduled.   no  Notes to clinic.  Labs are expired.    Requested Prescriptions  Pending Prescriptions Disp Refills   levothyroxine (SYNTHROID) 50 MCG tablet [Pharmacy Med Name: Levothyroxine Sodium 50 MCG Oral Tablet] 70 tablet 4    Sig: TAKE 1 TABLET BY MOUTH  DAILY     Endocrinology:  Hypothyroid Agents Failed - 04/16/2022 10:29 PM      Failed - TSH in normal range and within 360 days    TSH  Date Value Ref Range Status  01/23/2021 2.80 0.40 - 4.50 mIU/L Final         Passed - Valid encounter within last 12 months    Recent Outpatient Visits           9 months ago Chronic kidney disease (CKD), stage IV (severe) ([place])   [place] Pickard, Cammie Mcgee, MD   1 year ago Essential hypertension   [place] Susy Frizzle, MD   2 years ago Type II diabetes mellitus with end-stage renal disease ([place])   [place] Susy Frizzle, MD   3 years ago Type II diabetes mellitus with end-stage renal disease ([place])   [place] Susy Frizzle, MD   3 years ago Type II diabetes mellitus with end-stage renal disease ([place])   [facility] Pickard, Cammie Mcgee, MD

## 2022-05-19 DIAGNOSIS — I120 Hypertensive chronic kidney disease with stage 5 chronic kidney disease or end stage renal disease: Secondary | ICD-10-CM | POA: Diagnosis not present

## 2022-05-19 DIAGNOSIS — E872 Acidosis, unspecified: Secondary | ICD-10-CM | POA: Diagnosis not present

## 2022-05-19 DIAGNOSIS — E1122 Type 2 diabetes mellitus with diabetic chronic kidney disease: Secondary | ICD-10-CM | POA: Diagnosis not present

## 2022-05-19 DIAGNOSIS — N2581 Secondary hyperparathyroidism of renal origin: Secondary | ICD-10-CM | POA: Diagnosis not present

## 2022-05-19 DIAGNOSIS — D631 Anemia in chronic kidney disease: Secondary | ICD-10-CM | POA: Diagnosis not present

## 2022-05-19 DIAGNOSIS — M109 Gout, unspecified: Secondary | ICD-10-CM | POA: Diagnosis not present

## 2022-05-19 DIAGNOSIS — N189 Chronic kidney disease, unspecified: Secondary | ICD-10-CM | POA: Diagnosis not present

## 2022-05-19 DIAGNOSIS — N185 Chronic kidney disease, stage 5: Secondary | ICD-10-CM | POA: Diagnosis not present

## 2022-05-19 DIAGNOSIS — E875 Hyperkalemia: Secondary | ICD-10-CM | POA: Diagnosis not present

## 2022-05-25 ENCOUNTER — Other Ambulatory Visit: Payer: Self-pay | Admitting: Family Medicine

## 2022-05-25 NOTE — Telephone Encounter (Signed)
Requested Prescriptions  Pending Prescriptions Disp Refills   metoprolol succinate (TOPROL-XL) 50 MG 24 hr tablet [Pharmacy Med Name: Metoprolol Succinate ER 50 MG Oral Tablet Extended Release 24 Hour] 90 tablet 1    Sig: TAKE 1 TABLET BY MOUTH  DAILY WITH OR IMMEDIATELY  FOLLOWING A MEAL     Cardiovascular:  Beta Blockers Failed - 05/25/2022  1:22 PM      Failed - Valid encounter within last 6 months    Recent Outpatient Visits           10 months ago Chronic kidney disease (CKD), stage IV (severe) ([place])   [place] Pickard, Cammie Mcgee, MD   1 year ago Essential hypertension   [place], Warren T, MD   2 years ago Type II diabetes mellitus with end-stage renal disease ([place])   [place] Susy Frizzle, MD   3 years ago Type II diabetes mellitus with end-stage renal disease ([place])   [place] Susy Frizzle, MD   3 years ago Type II diabetes mellitus with end-stage renal disease ([place])   [place] Pickard, Cammie Mcgee, MD              Passed - Last BP in normal range    BP Readings from Last 1 Encounters:  04/02/22 132/82         Passed - Last Heart Rate in normal range    Pulse Readings from Last 1 Encounters:  04/02/22 72

## 2022-05-28 ENCOUNTER — Other Ambulatory Visit (HOSPITAL_COMMUNITY): Payer: Self-pay | Admitting: *Deleted

## 2022-06-01 ENCOUNTER — Encounter (HOSPITAL_COMMUNITY)
Admission: RE | Admit: 2022-06-01 | Discharge: 2022-06-01 | Disposition: A | Discharge status: Home / Self Care | Payer: No Typology Code available for payment source | Source: Ambulatory Visit | Attending: Nephrology | Admitting: Nephrology

## 2022-06-01 DIAGNOSIS — N189 Chronic kidney disease, unspecified: Secondary | ICD-10-CM | POA: Diagnosis not present

## 2022-06-01 DIAGNOSIS — D631 Anemia in chronic kidney disease: Secondary | ICD-10-CM | POA: Insufficient documentation

## 2022-06-01 MED ORDER — SODIUM CHLORIDE 0.9 % IV SOLN
510.0000 mg | INTRAVENOUS | Status: DC
  Administered 2022-06-01: 510 mg via INTRAVENOUS
  Filled 2022-06-01: qty 17

## 2022-06-08 ENCOUNTER — Ambulatory Visit (HOSPITAL_COMMUNITY)
Admission: RE | Admit: 2022-06-08 | Discharge: 2022-06-08 | Disposition: A | Discharge status: Home / Self Care | Payer: No Typology Code available for payment source | Source: Ambulatory Visit | Attending: Nephrology | Admitting: Nephrology

## 2022-06-08 DIAGNOSIS — D631 Anemia in chronic kidney disease: Secondary | ICD-10-CM | POA: Insufficient documentation

## 2022-06-08 MED ORDER — SODIUM CHLORIDE 0.9 % IV SOLN
510.0000 mg | INTRAVENOUS | Status: DC
  Administered 2022-06-08: 510 mg via INTRAVENOUS
  Filled 2022-06-08: qty 17

## 2022-07-20 ENCOUNTER — Other Ambulatory Visit: Payer: Self-pay | Admitting: Family Medicine

## 2022-07-20 NOTE — Telephone Encounter (Signed)
Requested medication (s) are due for refill today: historical medication  Requested medication (s) are on the active medication list: yes  Last refill:  04/01/22  Future visit scheduled: no  Notes to clinic:  historical medication. Last OV 07/23/21. Do you want to order Rx? Needs annual exam     Requested Prescriptions  Pending Prescriptions Disp Refills   LANTUS SOLOSTAR 100 UNIT/ML Solostar Pen [Pharmacy Med Name: Lantus Solostar U-100 Insulin 100 unit/mL (3 mL) subcutaneous pen] 15 mL 5    Sig: INJECT 0.32ML (32 UNITS) INTO THE SKIN DAILY.     Endocrinology:  Diabetes - Insulins Failed - 07/20/2022  3:06 PM      Failed - HBA1C is between 0 and 7.9 and within 180 days    Hgb A1c MFr Bld  Date Value Ref Range Status  07/23/2021 6.0 (H) <5.7 % of total Hgb Final    Comment:    For someone without known diabetes, a hemoglobin  A1c value between 5.7% and 6.4% is consistent with prediabetes and should be confirmed with a  follow-up test. . For someone with known diabetes, a value <7% indicates that their diabetes is well controlled. A1c targets should be individualized based on duration of diabetes, age, comorbid conditions, and other considerations. . This assay result is consistent with an increased risk of diabetes. . Currently, no consensus exists regarding use of hemoglobin A1c for diagnosis of diabetes for children. .          Failed - Valid encounter within last 6 months    Recent Outpatient Visits           12 months ago Chronic kidney disease (CKD), stage IV (severe) ([place])   [place] Pickard, Cammie Mcgee, MD   1 year ago Essential hypertension   [place] Schaumann, Cammie Mcgee, MD   2 years ago Type II diabetes mellitus with end-stage renal disease ([place])   [place] Susy Frizzle, MD   3 years ago Type II diabetes mellitus with end-stage renal disease ([place])   [place] Susy Frizzle, MD   3 years ago Type II diabetes mellitus with end-stage renal disease ([place])   [place] Pickard, Cammie Mcgee, MD              Refused Prescriptions Disp Refills   NOVOLOG 100 UNIT/ML injection [Pharmacy Med Name: Novolog U-100 Insulin aspart 100 unit/mL subcutaneous solution] 10 mL 5    Sig: INJECT SIX UNITS AT LUNCH     Endocrinology:  Diabetes - Insulins Failed - 07/20/2022  3:06 PM      Failed - HBA1C is between 0 and 7.9 and within 180 days    Hgb A1c MFr Bld  Date Value Ref Range Status  07/23/2021 6.0 (H) <5.7 % of total Hgb Final    Comment:    For someone without known diabetes, a hemoglobin  A1c value between 5.7% and 6.4% is consistent with prediabetes and should be confirmed with a  follow-up test. . For someone with known diabetes, a value <7% indicates that their diabetes is well controlled. A1c targets should be individualized based on duration of diabetes, age, comorbid conditions, and other considerations. . This assay result is consistent with an increased risk of diabetes. . Currently, no consensus exists regarding use of hemoglobin A1c for diagnosis of diabetes for children. .          Failed - Valid encounter  within last 6 months    Recent Outpatient Visits           12 months ago Chronic kidney disease (CKD), stage IV (severe) ([place])   [place] Pickard, Cammie Mcgee, MD   1 year ago Essential hypertension   [place] Susy Frizzle, MD   2 years ago Type II diabetes mellitus with end-stage renal disease ([place])   [place] Susy Frizzle, MD   3 years ago Type II diabetes mellitus with end-stage renal disease ([place])   [facility] Susy Frizzle, MD   3 years ago Type II diabetes mellitus with end-stage renal disease ([place])   [facility] Pickard, Cammie Mcgee, MD

## 2022-07-20 NOTE — Telephone Encounter (Signed)
Requested by interface surescripts. Dose change. Medication insulin discontinued 06/09/21.  Requested Prescriptions  Pending Prescriptions Disp Refills   LANTUS SOLOSTAR 100 UNIT/ML Solostar Pen [Pharmacy Med Name: Lantus Solostar U-100 Insulin 100 unit/mL (3 mL) subcutaneous pen] 15 mL 5    Sig: INJECT 0.32ML (32 UNITS) INTO THE SKIN DAILY.     Endocrinology:  Diabetes - Insulins Failed - 07/20/2022  3:06 PM      Failed - HBA1C is between 0 and 7.9 and within 180 days    Hgb A1c MFr Bld  Date Value Ref Range Status  07/23/2021 6.0 (H) <5.7 % of total Hgb Final    Comment:    For someone without known diabetes, a hemoglobin  A1c value between 5.7% and 6.4% is consistent with prediabetes and should be confirmed with a  follow-up test. . For someone with known diabetes, a value <7% indicates that their diabetes is well controlled. A1c targets should be individualized based on duration of diabetes, age, comorbid conditions, and other considerations. . This assay result is consistent with an increased risk of diabetes. . Currently, no consensus exists regarding use of hemoglobin A1c for diagnosis of diabetes for children. .          Failed - Valid encounter within last 6 months    Recent Outpatient Visits           12 months ago Chronic kidney disease (CKD), stage IV (severe) ([place])   [place] Pickard, Cammie Mcgee, MD   1 year ago Essential hypertension   [place] Schaumann, Cammie Mcgee, MD   2 years ago Type II diabetes mellitus with end-stage renal disease ([place])   [place] Susy Frizzle, MD   3 years ago Type II diabetes mellitus with end-stage renal disease ([place])   [place] Susy Frizzle, MD   3 years ago Type II diabetes mellitus with end-stage renal disease ([place])   [place] Pickard, Cammie Mcgee, MD              Refused Prescriptions Disp Refills   NOVOLOG 100 UNIT/ML  injection [Pharmacy Med Name: Novolog U-100 Insulin aspart 100 unit/mL subcutaneous solution] 10 mL 5    Sig: INJECT SIX UNITS AT LUNCH     Endocrinology:  Diabetes - Insulins Failed - 07/20/2022  3:06 PM      Failed - HBA1C is between 0 and 7.9 and within 180 days    Hgb A1c MFr Bld  Date Value Ref Range Status  07/23/2021 6.0 (H) <5.7 % of total Hgb Final    Comment:    For someone without known diabetes, a hemoglobin  A1c value between 5.7% and 6.4% is consistent with prediabetes and should be confirmed with a  follow-up test. . For someone with known diabetes, a value <7% indicates that their diabetes is well controlled. A1c targets should be individualized based on duration of diabetes, age, comorbid conditions, and other considerations. . This assay result is consistent with an increased risk of diabetes. . Currently, no consensus exists regarding use of hemoglobin A1c for diagnosis of diabetes for children. .          Failed - Valid encounter within last 6 months    Recent Outpatient Visits           12 months ago Chronic kidney disease (CKD), stage IV (severe) ([place])   [facility] Pickard, Cammie Mcgee, MD  1 year ago Essential hypertension   [place], Warren T, MD   2 years ago Type II diabetes mellitus with end-stage renal disease ([place])   [place] Susy Frizzle, MD   3 years ago Type II diabetes mellitus with end-stage renal disease ([place])   [place] Susy Frizzle, MD   3 years ago Type II diabetes mellitus with end-stage renal disease ([place])   [facility] Pickard, Cammie Mcgee, MD

## 2022-07-21 ENCOUNTER — Other Ambulatory Visit: Payer: Self-pay | Admitting: Family Medicine

## 2022-07-21 NOTE — Telephone Encounter (Signed)
Rx no longer listed on current medication list- change in therapy Requested Prescriptions  Pending Prescriptions Disp Refills   NOVOLOG 100 UNIT/ML injection [Pharmacy Med Name: Novolog U-100 Insulin aspart 100 unit/mL subcutaneous solution] 10 mL 5    Sig: INJECT SIX UNITS AT LUNCH     Endocrinology:  Diabetes - Insulins Failed - 07/20/2022  3:32 PM      Failed - HBA1C is between 0 and 7.9 and within 180 days    Hgb A1c MFr Bld  Date Value Ref Range Status  07/23/2021 6.0 (H) <5.7 % of total Hgb Final    Comment:    For someone without known diabetes, a hemoglobin  A1c value between 5.7% and 6.4% is consistent with prediabetes and should be confirmed with a  follow-up test. . For someone with known diabetes, a value <7% indicates that their diabetes is well controlled. A1c targets should be individualized based on duration of diabetes, age, comorbid conditions, and other considerations. . This assay result is consistent with an increased risk of diabetes. . Currently, no consensus exists regarding use of hemoglobin A1c for diagnosis of diabetes for children. .          Failed - Valid encounter within last 6 months    Recent Outpatient Visits           12 months ago Chronic kidney disease (CKD), stage IV (severe) ([place])   [place] Pickard, Cammie Mcgee, MD   1 year ago Essential hypertension   [place] Susy Frizzle, MD   2 years ago Type II diabetes mellitus with end-stage renal disease ([place])   [place] Susy Frizzle, MD   3 years ago Type II diabetes mellitus with end-stage renal disease ([place])   [facility] Susy Frizzle, MD   3 years ago Type II diabetes mellitus with end-stage renal disease ([place])   [facility] Pickard, Cammie Mcgee, MD

## 2022-07-21 NOTE — Telephone Encounter (Signed)
Requested medication (s) are due for refill today - no  Requested medication (s) are on the active medication list -no  Future visit scheduled -no  Last refill: no longer listed on current medication list  Notes to clinic: Call to pharmacy advised patient list: Humalog and Solostar. They think patient may have requested wrong medication when she called in- they do need RF of Humalog if that is what patient is using. Attempted to call patient to confirm- no answer and unable to leave call back message. Advised will send to PCP for review   Requested Prescriptions  Pending Prescriptions Disp Refills   NOVOLOG 100 UNIT/ML injection [Pharmacy Med Name: Novolog U-100 Insulin aspart 100 unit/mL subcutaneous solution] 10 mL 5    Sig: INJECT SIX UNITS AT LUNCH     Endocrinology:  Diabetes - Insulins Failed - 07/21/2022  1:38 PM      Failed - HBA1C is between 0 and 7.9 and within 180 days    Hgb A1c MFr Bld  Date Value Ref Range Status  07/23/2021 6.0 (H) <5.7 % of total Hgb Final    Comment:    For someone without known diabetes, a hemoglobin  A1c value between 5.7% and 6.4% is consistent with prediabetes and should be confirmed with a  follow-up test. . For someone with known diabetes, a value <7% indicates that their diabetes is well controlled. A1c targets should be individualized based on duration of diabetes, age, comorbid conditions, and other considerations. . This assay result is consistent with an increased risk of diabetes. . Currently, no consensus exists regarding use of hemoglobin A1c for diagnosis of diabetes for children. .          Failed - Valid encounter within last 6 months    Recent Outpatient Visits           12 months ago Chronic kidney disease (CKD), stage IV (severe) ([place])   [place] Pickard, Cammie Mcgee, MD   1 year ago Essential hypertension   [place] Schaumann, Cammie Mcgee, MD   2 years ago Type II diabetes mellitus  with end-stage renal disease ([place])   [place] Susy Frizzle, MD   3 years ago Type II diabetes mellitus with end-stage renal disease ([place])   [place] Susy Frizzle, MD   3 years ago Type II diabetes mellitus with end-stage renal disease ([place])   [facility] Pickard, Cammie Mcgee, MD                 Requested Prescriptions  Pending Prescriptions Disp Refills   NOVOLOG 100 UNIT/ML injection [Pharmacy Med Name: Novolog U-100 Insulin aspart 100 unit/mL subcutaneous solution] 10 mL 5    Sig: INJECT SIX UNITS AT LUNCH     Endocrinology:  Diabetes - Insulins Failed - 07/21/2022  1:38 PM      Failed - HBA1C is between 0 and 7.9 and within 180 days    Hgb A1c MFr Bld  Date Value Ref Range Status  07/23/2021 6.0 (H) <5.7 % of total Hgb Final    Comment:    For someone without known diabetes, a hemoglobin  A1c value between 5.7% and 6.4% is consistent with prediabetes and should be confirmed with a  follow-up test. . For someone with known diabetes, a value <7% indicates that their diabetes is well controlled. A1c targets should be individualized based on duration of diabetes, age, comorbid conditions, and other  considerations. . This assay result is consistent with an increased risk of diabetes. . Currently, no consensus exists regarding use of hemoglobin A1c for diagnosis of diabetes for children. .          Failed - Valid encounter within last 6 months    Recent Outpatient Visits           12 months ago Chronic kidney disease (CKD), stage IV (severe) ([place])   [place] Pickard, Cammie Mcgee, MD   1 year ago Essential hypertension   [place] Susy Frizzle, MD   2 years ago Type II diabetes mellitus with end-stage renal disease ([place])   [place] Susy Frizzle, MD   3 years ago Type II diabetes mellitus with end-stage renal disease ([place])   [facility] Susy Frizzle, MD   3 years ago Type II diabetes mellitus with end-stage renal disease ([place])   [facility] Pickard, Cammie Mcgee, MD

## 2022-07-23 ENCOUNTER — Other Ambulatory Visit: Payer: Self-pay | Admitting: Family Medicine

## 2022-07-29 ENCOUNTER — Other Ambulatory Visit: Payer: Self-pay | Admitting: Family Medicine

## 2022-07-30 NOTE — Telephone Encounter (Signed)
Requested Prescriptions  Pending Prescriptions Disp Refills   metoprolol succinate (TOPROL-XL) 50 MG 24 hr tablet [Pharmacy Med Name: Metoprolol Succinate ER 50 MG Oral Tablet Extended Release 24 Hour] 100 tablet 2    Sig: TAKE 1 TABLET BY MOUTH DAILY  WITH OR IMMEDIATELY FOLLOWING A  MEAL     Cardiovascular:  Beta Blockers Failed - 07/29/2022 10:09 PM      Failed - Valid encounter within last 6 months    Recent Outpatient Visits           1 year ago Chronic kidney disease (CKD), stage IV (severe) ([place])   [place] Pickard, Cammie Mcgee, MD   1 year ago Essential hypertension   [place], Warren T, MD   2 years ago Type II diabetes mellitus with end-stage renal disease ([place])   [place] Susy Frizzle, MD   3 years ago Type II diabetes mellitus with end-stage renal disease ([place])   [place] Susy Frizzle, MD   3 years ago Type II diabetes mellitus with end-stage renal disease ([place])   [place] Pickard, Cammie Mcgee, MD              Passed - Last BP in normal range    BP Readings from Last 1 Encounters:  06/08/22 (!) 132/58         Passed - Last Heart Rate in normal range    Pulse Readings from Last 1 Encounters:  06/08/22 65

## 2022-08-16 ENCOUNTER — Telehealth: Payer: Self-pay | Admitting: Family Medicine

## 2022-08-16 NOTE — Telephone Encounter (Signed)
Patient's sister Basilia Jumbo called to follow up on patient's need for a lift chair and a wheelchair; stated it's becoming more difficult for the patient to get up and move around. Patient's insurance company [facility])  is requiring proof of need from the patient's doctor. Ms. Cheryll Cockayne will call back with the fax number to send the letter to.   Please advise with any questions at [phone], or contact sister Basilia Jumbo at [phone].

## 2022-08-24 ENCOUNTER — Ambulatory Visit (INDEPENDENT_AMBULATORY_CARE_PROVIDER_SITE_OTHER): Payer: Medicare Other | Admitting: Family Medicine

## 2022-08-24 ENCOUNTER — Encounter: Payer: Self-pay | Admitting: Family Medicine

## 2022-08-24 VITALS — BP 130/70 | HR 84 | Temp 98.3°F | Ht 63.0 in | Wt 211.0 lb

## 2022-08-24 DIAGNOSIS — M5136 Other intervertebral disc degeneration, lumbar region: Secondary | ICD-10-CM

## 2022-08-24 DIAGNOSIS — E1122 Type 2 diabetes mellitus with diabetic chronic kidney disease: Secondary | ICD-10-CM

## 2022-08-24 DIAGNOSIS — N186 End stage renal disease: Secondary | ICD-10-CM | POA: Diagnosis not present

## 2022-08-24 DIAGNOSIS — N184 Chronic kidney disease, stage 4 (severe): Secondary | ICD-10-CM

## 2022-08-24 DIAGNOSIS — M170 Bilateral primary osteoarthritis of knee: Secondary | ICD-10-CM | POA: Diagnosis not present

## 2022-08-24 DIAGNOSIS — R29898 Other symptoms and signs involving the musculoskeletal system: Secondary | ICD-10-CM

## 2022-08-24 DIAGNOSIS — R140 Abdominal distension (gaseous): Secondary | ICD-10-CM

## 2022-08-24 MED ORDER — FAMOTIDINE 40 MG PO TABS: 40.0000 mg | ORAL_TABLET | Freq: Every day | ORAL | 3 refills | Status: DC

## 2022-08-24 NOTE — Progress Notes (Signed)
Subjective:    Patient ID: Emily Hughes, female    DOB: Jun 08, 1941, 82 y.o.   MRN: [id number]   Patient is a very pleasant 82 year old African-American female with a history of stage IV to stage V chronic kidney disease.  She is on insulin for type 2 diabetes mellitus.  She last saw her nephrologist in November.  She is currently on insulin for type 2 diabetes mellitus.  She states that she is checking her sugar every day.  She states that the lowest her sugars have been is 100 in the morning fasting.  She denies any hypoglycemic episodes.  She states that the majority of her sugars are around 120.  She denies any sugars greater than 200.  However she has fallen recently.  Due to degenerative disc disease in her back as well as advancing age as well as bilateral osteoarthritis in her knees, she has developed leg weakness bilaterally.  She is having a difficult time standing and walking.  Recently she was trying to stand up to get out of a chair when she lost her balance and fell to the floor.  Fortunately she was not injured.  However she had to call 911 to have people help her get up out of the floor.  She lives alone.  She would benefit from having a motorized chair lift to help get her to her feet.  She also has a difficult time ambulating around her home and out in public.  She is using a walker but it is very difficult for her to use the walker and walk for a prolonged period of time.  She would like to have a wheelchair that she can carry with her in her car that she can take to work and that she can take in the public.  She is still working.  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  It would benefit her to have a wheelchair that she could use to help maneuver at her home as well as at her job.  She also reports bloating and gas.  She reports increased burping and flatus.  She reports acid reflux.  I reviewed her medicine list with her.  She is not taking Pepcid.  She is not sure if she is taking  pantoprazole.  She is uncertain of her medication.  She denies any fevers or chills.  She denies any vomiting.  She denies any diarrhea.  She denies any melena or hematochezia Past Medical History:  Diagnosis Date   Anemia    CAD (coronary artery disease)    Colon polyps    CRI (chronic renal insufficiency)    DDD (degenerative disc disease)    Diabetes mellitus    INSULIN DEPENDENT   GERD (gastroesophageal reflux disease)    High cholesterol    Hypertension    Pancreatitis    Proteinuria    Past Surgical History:  Procedure Laterality Date   ABDOMINAL HYSTERECTOMY     ANGIOPLASTY     ANKLE SURGERY Right    BACK SURGERY     INCISION AND DRAINAGE ABSCESS N/A 11/09/2018   Procedure: INCISION AND DRAINAGE ABSCESS;  Surgeon: Coralie Keens, MD;  Location: [facility];  Service: General;  Laterality: N/A;   TONSILLECTOMY     Current Outpatient Medications on File Prior to Visit  Medication Sig Dispense Refill   acetaminophen (TYLENOL) 325 MG tablet Take 2 tablets (650 mg total) by mouth every 6 (six) hours as needed for mild pain (or Fever >/=  101).     amLODipine (NORVASC) 10 MG tablet Take 10 mg by mouth daily.     aspirin 81 MG tablet Take 81 mg by mouth daily.     B-D ULTRAFINE III SHORT PEN 31G X 8 MM MISC USE WITH LANTUS. 100 each 0   calcitRIOL (ROCALTROL) 0.25 MCG capsule Take 0.25 mcg by mouth daily.     cephALEXin (KEFLEX) 500 MG capsule Take 1 capsule (500 mg total) by mouth 4 (four) times daily. 28 capsule 0   cloNIDine (CATAPRES) 0.1 MG tablet Take 0.1 mg by mouth 2 (two) times daily.      colchicine 0.6 MG tablet TAKE ONE TABLET BY MOUTH DAILY. 90 tablet 0   collagenase (SANTYL) ointment Apply topically daily. 15 g 0   cyclobenzaprine (FLEXERIL) 5 MG tablet Take 1 tablet (5 mg total) by mouth 3 (three) times daily as needed for muscle spasms. 30 tablet 1   diclofenac Sodium (VOLTAREN) 1 % GEL Apply 2 g topically 4 (four) times daily. 100 g 2   diltiazem (CARDIZEM CD) 300  MG 24 hr capsule Take 1 capsule (300 mg total) by mouth daily. 30 capsule 0   diltiazem (TIAZAC) 300 MG 24 hr capsule Take 300 mg by mouth daily.     famotidine (PEPCID) 40 MG tablet Take 40 mg by mouth at bedtime.     febuxostat (ULORIC) 40 MG tablet Take 1 tablet (40 mg total) by mouth daily. 30 tablet 0   furosemide (LASIX) 40 MG tablet TAKE 2 TABLETS IN THE MORNING AND 1 TABLET IN THE EVENING. 270 tablet 1   HYDROcodone bit-homatropine (HYCODAN) 5-1.5 MG/5ML syrup Take 5 mLs by mouth every 8 (eight) hours as needed for cough. 120 mL 0   insulin lispro (HUMALOG) 100 UNIT/ML injection INJECT 0.1ML (10 UNITS) INTO SKIN DAILY WITH LUNCH 10 mL 0   Iron, Ferrous Sulfate, 325 (65 Fe) MG TABS Take 325 mg by mouth 2 (two) times daily. 60 tablet 2   LANTUS SOLOSTAR 100 UNIT/ML Solostar Pen INJECT 0.32ML (32 UNITS) INTO THE SKIN DAILY. 15 mL 5   levothyroxine (SYNTHROID) 50 MCG tablet TAKE 1 TABLET BY MOUTH DAILY 70 tablet 4   metoprolol succinate (TOPROL-XL) 50 MG 24 hr tablet TAKE 1 TABLET BY MOUTH  DAILY WITH OR IMMEDIATELY  FOLLOWING A MEAL 90 tablet 1   omeprazole (PRILOSEC) 40 MG capsule Take 40 mg by mouth every morning.     oxyCODONE (OXY IR/ROXICODONE) 5 MG immediate release tablet Take 1 tablet (5 mg total) by mouth every 6 (six) hours as needed for moderate pain. 30 tablet 0   pantoprazole (PROTONIX) 40 MG tablet Take 1 tablet (40 mg total) by mouth daily at 6 (six) AM. 30 tablet 0   polyethylene glycol (MIRALAX / GLYCOLAX) 17 g packet Take 17 g by mouth daily. 14 each 0   pravastatin (PRAVACHOL) 80 MG tablet Take 1 tablet (80 mg total) by mouth at bedtime. 90 tablet 1   senna (SENOKOT) 8.6 MG TABS tablet Take 2 tablets (17.2 mg total) by mouth at bedtime. 120 each 0   sodium bicarbonate 650 MG tablet Take 1 tablet (650 mg total) by mouth 4 (four) times daily. 360 tablet 1   sucralfate (CARAFATE) 1 G tablet Take 1 tablet (1 g total) by mouth 4 (four) times daily -  with meals and at bedtime.  120 tablet 0   VELTASSA 8.4 g packet Take 8.4 g by mouth daily.  No current facility-administered medications on file prior to visit.   Allergies  Allergen Reactions   5-Alpha Reductase Inhibitors    Ace Inhibitors    Actos [Pioglitazone Hydrochloride]    Angiotensin Receptor Blockers Hives   Dust Mite Extract    Dye Fdc Red [Fd&C Red #3 (Erythrosine Sodium)]    Fish Allergy    Iodinated Contrast Media Hives   Pioglitazone     Other reaction(s): swelling   Social History   Socioeconomic History   Marital status: Widowed    Spouse name: Not on file   Number of children: 0   Years of education: Not on file   Highest education level: Not on file  Occupational History   Occupation: Retired  Tobacco Use   Smoking status: Former   Smokeless tobacco: Never   Tobacco comments:    quit smoking in 1995 per pt.  Substance and Sexual Activity   Alcohol use: No    Alcohol/week: 0.0 standard drinks of alcohol   Drug use: No   Sexual activity: Not on file  Other Topics Concern   Not on file  Social History Narrative   Widow since 2008   Social Determinants of Health   Financial Resource Strain: Low Risk  (03/31/2022)   Overall Financial Resource Strain (CARDIA)    Difficulty of Paying Living Expenses: Not hard at all  Food Insecurity: No Food Insecurity (03/31/2022)   Hunger Vital Sign    Worried About Running Out of Food in the Last Year: Never true    Ran Out of Food in the Last Year: Never true  Transportation Needs: No Transportation Needs (03/31/2022)   PRAPARE - Hydrologist (Medical): No    Lack of Transportation (Non-Medical): No  Physical Activity: Inactive (03/31/2022)   Exercise Vital Sign    Days of Exercise per Week: 0 days    Minutes of Exercise per Session: 0 min  Stress: No Stress Concern Present (03/31/2022)   [place]    Feeling of Stress : Not at all   Social Connections: Moderately Integrated (03/31/2022)   Social Connection and Isolation Panel [NHANES]    Frequency of Communication with Friends and Family: More than three times a week    Frequency of Social Gatherings with Friends and Family: More than three times a week    Attends Religious Services: More than 4 times per year    Active Member of [employer] or Organizations: Yes    Attends Archivist Meetings: Never    Marital Status: Never married  Intimate Partner Violence: Not At Risk (03/31/2022)   Humiliation, Afraid, Rape, and Kick questionnaire    Fear of Current or Ex-Partner: No    Emotionally Abused: No    Physically Abused: No    Sexually Abused: No      Review of Systems  Gastrointestinal:  Positive for abdominal pain.       Objective:   Physical Exam Constitutional:      General: She is not in acute distress.    Appearance: She is obese. She is not ill-appearing or toxic-appearing.  Cardiovascular:     Rate and Rhythm: Normal rate and regular rhythm.     Heart sounds: Normal heart sounds. No murmur heard. Pulmonary:     Effort: Pulmonary effort is normal.     Breath sounds: Normal breath sounds. No wheezing, rhonchi or rales.  Abdominal:     General: Bowel  sounds are normal. There is no distension.     Palpations: Abdomen is soft.     Tenderness: There is no abdominal tenderness. There is no guarding or rebound.     Hernia: No hernia is present.  Musculoskeletal:     Right knee: Decreased range of motion. Tenderness present.     Left knee: Decreased range of motion. Tenderness present.     Right lower leg: No swelling. No edema.     Left lower leg: No swelling. No edema.  Neurological:     Mental Status: She is alert.     Motor: Weakness present.     Gait: Gait abnormal.           Assessment & Plan:  Type II diabetes mellitus with end-stage renal disease ([place]) - Plan: Hemoglobin A1c, COMPLETE METABOLIC PANEL WITH GFR, Lipid  panel  Chronic kidney disease (CKD), stage IV (severe) (HCC)  Bilateral leg weakness  DDD (degenerative disc disease), lumbar  Osteoarthritis of both knees, unspecified osteoarthritis type  Abdominal bloating While the patient is here, check an A1c along with a CMP to monitor her electrolytes.  Ideally I like her A1c to be around 7 and to avoid hypoglycemia.  I believe the patient's bilateral leg weakness and falls is due to deconditioning coupled with obesity coupled with degenerative disc disease in the lumbar spine coupled with bilateral osteoarthritis.  I wrote the patient a prescription for a manual wheelchair that she could transport in her vehicle.  Also wrote the patient a prescription for a motorized chair lift given the fact that she lives alone.  I am concerned that she is going to continue to fall if she tries to get up by herself without assistance and that this could lead to hip fracture or other preventable injury.  I believe the bloating is likely due to acid reflux.  Therefore of asked her to go home and that she is taking Protonix.  If she is not taking Protonix start Protonix 40 mg daily.  If she is taking Protonix I will add famotidine 40 mg at night

## 2022-08-25 ENCOUNTER — Telehealth: Payer: Self-pay

## 2022-08-25 LAB — COMPLETE METABOLIC PANEL WITH GFR
AG Ratio: 1.4 (calc) (ref 1.0–2.5)
ALT: 5 U/L — ABNORMAL LOW (ref 6–29)
AST: 12 U/L (ref 10–35)
Albumin: 3.9 g/dL (ref 3.6–5.1)
Alkaline phosphatase (APISO): 135 U/L (ref 37–153)
BUN/Creatinine Ratio: 13 (calc) (ref 6–22)
BUN: 62 mg/dL — ABNORMAL HIGH (ref 7–25)
CO2: 18 mmol/L — ABNORMAL LOW (ref 20–32)
Calcium: 9.2 mg/dL (ref 8.6–10.4)
Chloride: 106 mmol/L (ref 98–110)
Creat: 4.65 mg/dL — ABNORMAL HIGH (ref 0.60–1.00)
Globulin: 2.8 g/dL (calc) (ref 1.9–3.7)
Glucose, Bld: 89 mg/dL (ref 65–99)
Potassium: 5.3 mmol/L (ref 3.5–5.3)
Sodium: 138 mmol/L (ref 135–146)
Total Bilirubin: 0.3 mg/dL (ref 0.2–1.2)
Total Protein: 6.7 g/dL (ref 6.1–8.1)
eGFR: 9 mL/min/{1.73_m2} — ABNORMAL LOW (ref >=60)

## 2022-08-25 LAB — LIPID PANEL
Cholesterol: 191 mg/dL (ref <200)
HDL: 44 mg/dL — ABNORMAL LOW (ref >=50)
LDL Cholesterol (Calc): 112 mg/dL (calc) — ABNORMAL HIGH
Non-HDL Cholesterol (Calc): 147 mg/dL (calc) — ABNORMAL HIGH (ref <130)
Total CHOL/HDL Ratio: 4.3 (calc) (ref <5.0)
Triglycerides: 233 mg/dL — ABNORMAL HIGH (ref <150)

## 2022-08-25 LAB — HEMOGLOBIN A1C
Hgb A1c MFr Bld: 5.7 % of total Hgb — ABNORMAL HIGH (ref <5.7)
Mean Plasma Glucose: 117 mg/dL
eAG (mmol/L): 6.5 mmol/L

## 2022-08-25 NOTE — Telephone Encounter (Signed)
Pt called to advise that she was on Omeprazole 40 mg previously but states it didn't help. Thanks.

## 2022-08-26 ENCOUNTER — Other Ambulatory Visit: Payer: Self-pay

## 2022-08-26 DIAGNOSIS — K219 Gastro-esophageal reflux disease without esophagitis: Secondary | ICD-10-CM

## 2022-08-26 MED ORDER — PANTOPRAZOLE SODIUM 40 MG PO TBEC: 40.0000 mg | DELAYED_RELEASE_TABLET | Freq: Every day | ORAL | 3 refills | Status: DC

## 2022-08-31 ENCOUNTER — Telehealth: Payer: Self-pay | Admitting: Family Medicine

## 2022-08-31 NOTE — Telephone Encounter (Signed)
Received call from patient's sister to follow up on script patient received for a lift chair and a wheel chair. Patient is unsure of where to go to pick them up. Requesting call back.  Please advise at [phone], or [phone].

## 2022-09-01 ENCOUNTER — Other Ambulatory Visit: Payer: Self-pay

## 2022-09-01 DIAGNOSIS — E1110 Type 2 diabetes mellitus with ketoacidosis without coma: Secondary | ICD-10-CM

## 2022-09-01 MED ORDER — LANTUS SOLOSTAR 100 UNIT/ML ~~LOC~~ SOPN: 10.0000 [IU] | PEN_INJECTOR | Freq: Every day | SUBCUTANEOUS | 5 refills | Status: DC

## 2022-09-12 ENCOUNTER — Other Ambulatory Visit: Payer: Self-pay

## 2022-09-12 ENCOUNTER — Inpatient Hospital Stay (HOSPITAL_COMMUNITY)
Admission: EM | Admit: 2022-09-12 | Discharge: 2022-09-16 | DRG: 092 | Disposition: A | Discharge status: Skilled Nursing Facility | Payer: Medicare Other | Attending: Internal Medicine | Admitting: Internal Medicine

## 2022-09-12 ENCOUNTER — Encounter (HOSPITAL_COMMUNITY): Payer: Self-pay

## 2022-09-12 ENCOUNTER — Emergency Department (HOSPITAL_COMMUNITY): Payer: Medicare Other

## 2022-09-12 DIAGNOSIS — Z1152 Encounter for screening for COVID-19: Secondary | ICD-10-CM | POA: Diagnosis not present

## 2022-09-12 DIAGNOSIS — Z79890 Hormone replacement therapy (postmenopausal): Secondary | ICD-10-CM

## 2022-09-12 DIAGNOSIS — I674 Hypertensive encephalopathy: Secondary | ICD-10-CM | POA: Diagnosis present

## 2022-09-12 DIAGNOSIS — M109 Gout, unspecified: Secondary | ICD-10-CM | POA: Diagnosis present

## 2022-09-12 DIAGNOSIS — Z9181 History of falling: Secondary | ICD-10-CM

## 2022-09-12 DIAGNOSIS — E875 Hyperkalemia: Secondary | ICD-10-CM | POA: Diagnosis present

## 2022-09-12 DIAGNOSIS — Z86010 Personal history of colonic polyps: Secondary | ICD-10-CM

## 2022-09-12 DIAGNOSIS — Z91041 Radiographic dye allergy status: Secondary | ICD-10-CM | POA: Diagnosis not present

## 2022-09-12 DIAGNOSIS — R7989 Other specified abnormal findings of blood chemistry: Secondary | ICD-10-CM | POA: Diagnosis present

## 2022-09-12 DIAGNOSIS — R4182 Altered mental status, unspecified: Principal | ICD-10-CM

## 2022-09-12 DIAGNOSIS — M170 Bilateral primary osteoarthritis of knee: Secondary | ICD-10-CM | POA: Diagnosis present

## 2022-09-12 DIAGNOSIS — K219 Gastro-esophageal reflux disease without esophagitis: Secondary | ICD-10-CM | POA: Diagnosis present

## 2022-09-12 DIAGNOSIS — Z833 Family history of diabetes mellitus: Secondary | ICD-10-CM

## 2022-09-12 DIAGNOSIS — Z91013 Allergy to seafood: Secondary | ICD-10-CM

## 2022-09-12 DIAGNOSIS — R569 Unspecified convulsions: Secondary | ICD-10-CM | POA: Diagnosis present

## 2022-09-12 DIAGNOSIS — I2510 Atherosclerotic heart disease of native coronary artery without angina pectoris: Secondary | ICD-10-CM | POA: Diagnosis present

## 2022-09-12 DIAGNOSIS — Z79899 Other long term (current) drug therapy: Secondary | ICD-10-CM

## 2022-09-12 DIAGNOSIS — Z794 Long term (current) use of insulin: Secondary | ICD-10-CM | POA: Diagnosis not present

## 2022-09-12 DIAGNOSIS — Z90710 Acquired absence of both cervix and uterus: Secondary | ICD-10-CM

## 2022-09-12 DIAGNOSIS — I129 Hypertensive chronic kidney disease with stage 1 through stage 4 chronic kidney disease, or unspecified chronic kidney disease: Secondary | ICD-10-CM | POA: Diagnosis present

## 2022-09-12 DIAGNOSIS — G9341 Metabolic encephalopathy: Secondary | ICD-10-CM | POA: Diagnosis present

## 2022-09-12 DIAGNOSIS — Z87891 Personal history of nicotine dependence: Secondary | ICD-10-CM | POA: Diagnosis not present

## 2022-09-12 DIAGNOSIS — N189 Chronic kidney disease, unspecified: Secondary | ICD-10-CM | POA: Diagnosis present

## 2022-09-12 DIAGNOSIS — Z9102 Food additives allergy status: Secondary | ICD-10-CM

## 2022-09-12 DIAGNOSIS — I10 Essential (primary) hypertension: Secondary | ICD-10-CM | POA: Diagnosis not present

## 2022-09-12 DIAGNOSIS — T50915A Adverse effect of multiple unspecified drugs, medicaments and biological substances, initial encounter: Secondary | ICD-10-CM | POA: Diagnosis present

## 2022-09-12 DIAGNOSIS — R627 Adult failure to thrive: Secondary | ICD-10-CM | POA: Diagnosis present

## 2022-09-12 DIAGNOSIS — Z888 Allergy status to other drugs, medicaments and biological substances status: Secondary | ICD-10-CM

## 2022-09-12 DIAGNOSIS — D631 Anemia in chronic kidney disease: Secondary | ICD-10-CM | POA: Diagnosis present

## 2022-09-12 DIAGNOSIS — E1122 Type 2 diabetes mellitus with diabetic chronic kidney disease: Secondary | ICD-10-CM | POA: Diagnosis present

## 2022-09-12 DIAGNOSIS — G928 Other toxic encephalopathy: Secondary | ICD-10-CM | POA: Diagnosis present

## 2022-09-12 DIAGNOSIS — N184 Chronic kidney disease, stage 4 (severe): Secondary | ICD-10-CM | POA: Diagnosis present

## 2022-09-12 DIAGNOSIS — E872 Acidosis, unspecified: Secondary | ICD-10-CM | POA: Diagnosis present

## 2022-09-12 DIAGNOSIS — D509 Iron deficiency anemia, unspecified: Secondary | ICD-10-CM | POA: Diagnosis present

## 2022-09-12 DIAGNOSIS — Z7982 Long term (current) use of aspirin: Secondary | ICD-10-CM

## 2022-09-12 DIAGNOSIS — E7800 Pure hypercholesterolemia, unspecified: Secondary | ICD-10-CM | POA: Diagnosis present

## 2022-09-12 DIAGNOSIS — Z9109 Other allergy status, other than to drugs and biological substances: Secondary | ICD-10-CM

## 2022-09-12 LAB — RESP PANEL BY RT-PCR (RSV, FLU A&B, COVID)  RVPGX2
Influenza A by PCR: NEGATIVE
Influenza B by PCR: NEGATIVE
Resp Syncytial Virus by PCR: NEGATIVE
SARS Coronavirus 2 by RT PCR: NEGATIVE

## 2022-09-12 LAB — URINALYSIS, ROUTINE W REFLEX MICROSCOPIC
Bilirubin Urine: NEGATIVE
Glucose, UA: 150 mg/dL — AB
Ketones, ur: NEGATIVE mg/dL
Leukocytes,Ua: NEGATIVE
Nitrite: NEGATIVE
Protein, ur: >=300 mg/dL — AB
Specific Gravity, Urine: 1.014 (ref 1.005–1.030)
pH: 6 (ref 5.0–8.0)

## 2022-09-12 LAB — BASIC METABOLIC PANEL
Anion gap: 7 (ref 5–15)
BUN: 43 mg/dL — ABNORMAL HIGH (ref 8–23)
CO2: 21 mmol/L — ABNORMAL LOW (ref 22–32)
Calcium: 10 mg/dL (ref 8.9–10.3)
Chloride: 111 mmol/L (ref 98–111)
Creatinine, Ser: 4.67 mg/dL — ABNORMAL HIGH (ref 0.44–1.00)
GFR, Estimated: 9 mL/min — ABNORMAL LOW (ref >60)
Glucose, Bld: 196 mg/dL — ABNORMAL HIGH (ref 70–99)
Potassium: 5.3 mmol/L — ABNORMAL HIGH (ref 3.5–5.1)
Sodium: 139 mmol/L (ref 135–145)

## 2022-09-12 LAB — CBC WITH DIFFERENTIAL/PLATELET
Abs Immature Granulocytes: 0.02 10*3/uL (ref 0.00–0.07)
Basophils Absolute: 0 10*3/uL (ref 0.0–0.1)
Basophils Relative: 1 %
Eosinophils Absolute: 0.1 10*3/uL (ref 0.0–0.5)
Eosinophils Relative: 1 %
HCT: 29.8 % — ABNORMAL LOW (ref 36.0–46.0)
Hemoglobin: 8.9 g/dL — ABNORMAL LOW (ref 12.0–15.0)
Immature Granulocytes: 0 %
Lymphocytes Relative: 16 %
Lymphs Abs: 1.1 10*3/uL (ref 0.7–4.0)
MCH: 26.1 pg (ref 26.0–34.0)
MCHC: 29.9 g/dL — ABNORMAL LOW (ref 30.0–36.0)
MCV: 87.4 fL (ref 80.0–100.0)
Monocytes Absolute: 0.5 10*3/uL (ref 0.1–1.0)
Monocytes Relative: 8 %
Neutro Abs: 4.7 10*3/uL (ref 1.7–7.7)
Neutrophils Relative %: 74 %
Platelets: 230 10*3/uL (ref 150–400)
RBC: 3.41 MIL/uL — ABNORMAL LOW (ref 3.87–5.11)
RDW: 16.4 % — ABNORMAL HIGH (ref 11.5–15.5)
WBC: 6.4 10*3/uL (ref 4.0–10.5)
nRBC: 0 % (ref 0.0–0.2)

## 2022-09-12 LAB — CBG MONITORING, ED: Glucose-Capillary: 133 mg/dL — ABNORMAL HIGH (ref 70–99)

## 2022-09-12 MED ORDER — ASPIRIN 81 MG PO TBEC: 81.0000 mg | DELAYED_RELEASE_TABLET | Freq: Every day | ORAL | Status: DC

## 2022-09-12 MED ORDER — INSULIN ASPART 100 UNIT/ML IJ SOLN
0.0000 [IU] | INTRAMUSCULAR | Status: DC
  Administered 2022-09-13: 2 [IU] via SUBCUTANEOUS
  Administered 2022-09-13: 3 [IU] via SUBCUTANEOUS
  Administered 2022-09-13: 2 [IU] via SUBCUTANEOUS
  Administered 2022-09-13: 3 [IU] via SUBCUTANEOUS
  Administered 2022-09-14: 2 [IU] via SUBCUTANEOUS
  Administered 2022-09-14: 5 [IU] via SUBCUTANEOUS
  Administered 2022-09-14: 3 [IU] via SUBCUTANEOUS
  Administered 2022-09-14: 2 [IU] via SUBCUTANEOUS
  Administered 2022-09-15: 3 [IU] via SUBCUTANEOUS
  Administered 2022-09-15 (×2): 2 [IU] via SUBCUTANEOUS
  Administered 2022-09-15: 3 [IU] via SUBCUTANEOUS

## 2022-09-12 MED ORDER — SUCRALFATE 1 G PO TABS
1.0000 g | ORAL_TABLET | Freq: Three times a day (TID) | ORAL | Status: DC
  Administered 2022-09-12: 1 g via ORAL
  Filled 2022-09-12: qty 1

## 2022-09-12 MED ORDER — AMLODIPINE BESYLATE 5 MG PO TABS
10.0000 mg | ORAL_TABLET | Freq: Every day | ORAL | Status: DC
  Filled 2022-09-12: qty 2

## 2022-09-12 MED ORDER — INSULIN ASPART 100 UNIT/ML IJ SOLN: 0.0000 [IU] | Freq: Three times a day (TID) | INTRAMUSCULAR | Status: DC

## 2022-09-12 MED ORDER — METOPROLOL TARTRATE 5 MG/5ML IV SOLN
5.0000 mg | INTRAVENOUS | Status: DC | PRN
  Administered 2022-09-13: 5 mg via INTRAVENOUS
  Filled 2022-09-12: qty 5

## 2022-09-12 MED ORDER — SODIUM CHLORIDE 0.45 % IV SOLN
INTRAVENOUS | Status: DC
  Filled 2022-09-12: qty 75

## 2022-09-12 MED ORDER — PANTOPRAZOLE SODIUM 40 MG IV SOLR
40.0000 mg | Freq: Every day | INTRAVENOUS | Status: DC
  Administered 2022-09-13 – 2022-09-16 (×4): 40 mg via INTRAVENOUS
  Filled 2022-09-12 (×4): qty 10

## 2022-09-12 MED ORDER — LACTATED RINGERS IV BOLUS
1000.0000 mL | Freq: Once | INTRAVENOUS | Status: AC
  Administered 2022-09-12: 1000 mL via INTRAVENOUS

## 2022-09-12 MED ORDER — PANTOPRAZOLE SODIUM 40 MG PO TBEC: 40.0000 mg | DELAYED_RELEASE_TABLET | Freq: Every day | ORAL | Status: DC

## 2022-09-12 MED ORDER — PATIROMER SORBITEX CALCIUM 8.4 G PO PACK: 8.4000 g | PACK | Freq: Every day | ORAL | Status: DC

## 2022-09-12 MED ORDER — DILTIAZEM HCL ER COATED BEADS 300 MG PO CP24: 300.0000 mg | ORAL_CAPSULE | Freq: Every day | ORAL | Status: DC

## 2022-09-12 MED ORDER — CALCITRIOL 0.25 MCG PO CAPS: 0.2500 ug | ORAL_CAPSULE | Freq: Every day | ORAL | Status: DC

## 2022-09-12 MED ORDER — SODIUM BICARBONATE 650 MG PO TABS
650.0000 mg | ORAL_TABLET | Freq: Three times a day (TID) | ORAL | Status: DC
  Administered 2022-09-12: 650 mg via ORAL
  Filled 2022-09-12: qty 1

## 2022-09-12 MED ORDER — COLCHICINE 0.6 MG PO TABS: 0.6000 mg | ORAL_TABLET | Freq: Every day | ORAL | Status: DC

## 2022-09-12 MED ORDER — INSULIN ASPART 100 UNIT/ML IJ SOLN: 0.0000 [IU] | Freq: Every day | INTRAMUSCULAR | Status: DC

## 2022-09-12 MED ORDER — CLONIDINE HCL 0.1 MG PO TABS
0.1000 mg | ORAL_TABLET | Freq: Two times a day (BID) | ORAL | Status: DC
  Administered 2022-09-13: 0.1 mg via ORAL
  Filled 2022-09-12: qty 1

## 2022-09-12 MED ORDER — LEVOTHYROXINE SODIUM 25 MCG PO TABS: 50.0000 ug | ORAL_TABLET | Freq: Every day | ORAL | Status: DC

## 2022-09-12 MED ORDER — LEVOTHYROXINE SODIUM 100 MCG/5ML IV SOLN: 25.0000 ug | Freq: Every day | INTRAVENOUS | Status: DC

## 2022-09-12 NOTE — H&P (Incomplete)
PCP:   Susy Frizzle, MD   Chief Complaint:  Altered mentation  HPI: This is a 82 year old female with past medical history of CAD, CKD, HTN, diabetes mellitus.  Patient lives alone however family visits or contacts her daily.  Her sister the patient has been falling a lot over the past week.  She has been weaker and did not call EMS to help her up.  She additionally has been forgetting a lot.  Today she was completely disoriented, not recognizing her sister.  As this has been getting worse for the past 3 to 4 days, 911 was called she was taken to the ER.  Per sister, she is unsure if patient is even taking her medications.  No reports of fevers.  In the ER, patient remains confused.  Workup unrevealing.  UA, CXR & CT head normal. WBC 6.7. FSBS 133. UDS pending. Creatinine 4.67 (baseline). Initial BP 201/104.  Decrease in response to meds. HR 113, RR 30, afebrile.  Admission requested  Review of Systems:  Patient unable to provide due to confusion.  Past Medical History: Past Medical History:  Diagnosis Date   Anemia    CAD (coronary artery disease)    Colon polyps    CRI (chronic renal insufficiency)    DDD (degenerative disc disease)    Diabetes mellitus    INSULIN DEPENDENT   GERD (gastroesophageal reflux disease)    High cholesterol    Hypertension    Pancreatitis    Proteinuria    Past Surgical History:  Procedure Laterality Date   ABDOMINAL HYSTERECTOMY     ANGIOPLASTY     ANKLE SURGERY Right    BACK SURGERY     INCISION AND DRAINAGE ABSCESS N/A 11/09/2018   Procedure: INCISION AND DRAINAGE ABSCESS;  Surgeon: Coralie Keens, MD;  Location: [facility];  Service: General;  Laterality: N/A;   TONSILLECTOMY      Medications: Prior to Admission medications   Medication Sig Start Date End Date Taking? Authorizing Provider  acetaminophen (TYLENOL) 325 MG tablet Take 2 tablets (650 mg total) by mouth every 6 (six) hours as needed for mild pain (or Fever >/= 101). 11/29/18    Georgette Shell, MD  amLODipine (NORVASC) 10 MG tablet Take 10 mg by mouth daily. 03/23/21   [provider]  aspirin 81 MG tablet Take 81 mg by mouth daily.    [provider]  B-D ULTRAFINE III SHORT PEN 31G X 8 MM MISC USE WITH LANTUS. 07/31/18   Susy Frizzle, MD  calcitRIOL (ROCALTROL) 0.25 MCG capsule Take 0.25 mcg by mouth daily.    [provider]  cephALEXin (KEFLEX) 500 MG capsule Take 1 capsule (500 mg total) by mouth 4 (four) times daily. 04/02/22   Susy Frizzle, MD  cloNIDine (CATAPRES) 0.1 MG tablet Take 0.1 mg by mouth 2 (two) times daily.  12/06/18   [provider]  colchicine 0.6 MG tablet TAKE ONE TABLET BY MOUTH DAILY. 07/17/21   Susy Frizzle, MD  collagenase (SANTYL) ointment Apply topically daily. 11/29/18   Georgette Shell, MD  cyclobenzaprine (FLEXERIL) 5 MG tablet Take 1 tablet (5 mg total) by mouth 3 (three) times daily as needed for muscle spasms. 07/14/20   Susy Frizzle, MD  diclofenac Sodium (VOLTAREN) 1 % GEL Apply 2 g topically 4 (four) times daily. 07/23/21   Susy Frizzle, MD  diltiazem (CARDIZEM CD) 300 MG 24 hr capsule Take 1 capsule (300 mg total) by mouth  daily. 01/01/20   Alycia Rossetti, MD  diltiazem [facility]) 300 MG 24 hr capsule Take 300 mg by mouth daily. 12/08/20   [provider]  famotidine (PEPCID) 40 MG tablet Take 1 tablet (40 mg total) by mouth at bedtime. 08/24/22   Susy Frizzle, MD  febuxostat (ULORIC) 40 MG tablet Take 1 tablet (40 mg total) by mouth daily. 11/29/18   Georgette Shell, MD  FLUAD QUADRIVALENT 0.5 ML injection  05/20/22   [provider]  furosemide (LASIX) 40 MG tablet TAKE 2 TABLETS IN THE MORNING AND 1 TABLET IN THE EVENING. 01/23/19   Susy Frizzle, MD  HYDROcodone bit-homatropine (HYCODAN) 5-1.5 MG/5ML syrup Take 5 mLs by mouth every 8 (eight) hours as needed for cough. 03/10/21   Susy Frizzle, MD  insulin lispro (HUMALOG) 100 UNIT/ML  injection INJECT 0.1ML (10 UNITS) INTO SKIN DAILY WITH LUNCH 07/23/22   Susy Frizzle, MD  Iron, Ferrous Sulfate, 325 (65 Fe) MG TABS Take 325 mg by mouth 2 (two) times daily. 01/23/21   Susy Frizzle, MD  LANTUS SOLOSTAR 100 UNIT/ML Solostar Pen Inject 10 Units into the skin daily. 09/01/22   Susy Frizzle, MD  levothyroxine (SYNTHROID) 50 MCG tablet TAKE 1 TABLET BY MOUTH DAILY 04/19/22   Rubie Maid, FNP  metoprolol succinate (TOPROL-XL) 50 MG 24 hr tablet TAKE 1 TABLET BY MOUTH  DAILY WITH OR IMMEDIATELY  FOLLOWING A MEAL 05/25/22   Susy Frizzle, MD  oxyCODONE (OXY IR/ROXICODONE) 5 MG immediate release tablet Take 1 tablet (5 mg total) by mouth every 6 (six) hours as needed for moderate pain. 11/29/18   Georgette Shell, MD  pantoprazole (PROTONIX) 40 MG tablet Take 1 tablet (40 mg total) by mouth daily. 08/26/22   Susy Frizzle, MD  polyethylene glycol (MIRALAX / GLYCOLAX) 17 g packet Take 17 g by mouth daily. 11/29/18   Georgette Shell, MD  pravastatin (PRAVACHOL) 80 MG tablet Take 1 tablet (80 mg total) by mouth at bedtime. 02/16/19   Susy Frizzle, MD  senna (SENOKOT) 8.6 MG TABS tablet Take 2 tablets (17.2 mg total) by mouth at bedtime. 11/29/18   Georgette Shell, MD  sodium bicarbonate 650 MG tablet Take 1 tablet (650 mg total) by mouth 4 (four) times daily. 07/19/14   Susy Frizzle, MD  sucralfate (CARAFATE) 1 G tablet Take 1 tablet (1 g total) by mouth 4 (four) times daily -  with meals and at bedtime. 07/11/14   Susy Frizzle, MD  VELTASSA 8.4 g packet Take 8.4 g by mouth daily.  12/20/17   [provider]    Allergies:   Allergies  Allergen Reactions   5-Alpha Reductase Inhibitors    Ace Inhibitors    Actos [Pioglitazone Hydrochloride]    Angiotensin Receptor Blockers Hives   Dust Mite Extract    Dye Fdc Red [Fd&C Red #3 (Erythrosine Sodium)]    Fish Allergy    Iodinated Contrast Media Hives   Pioglitazone     Other  reaction(s): swelling    Social History:  reports that she has quit smoking. She has never used smokeless tobacco. She reports that she does not drink alcohol and does not use drugs.  Family History: Family History  Problem Relation Age of Onset   Diabetes Mother    Diabetes Father    Diabetes Sister     Physical Exam: Vitals:   09/12/22 1835 09/12/22 1900 09/12/22 1939 09/12/22  2100  BP: (!) 156/98   (!) 178/82  Pulse: (!) 106 96  97  Resp: (!) '22 18  18  '$ Temp:   (!) 97.5 F (36.4 C)   TempSrc:   Oral   SpO2: 100% 100%  100%  Weight:      Height:        General:  Alert confused patient.  Obese, no distress Eyes: PERRLA, pink conjunctiva, no scleral icterus ENT: Moist oral mucosa, neck supple, no thyromegaly Lungs: clear to ascultation, no wheeze, no crackles, no use of accessory muscles Cardiovascular: regular rate and rhythm, no regurgitation, no gallops, no murmurs. No carotid bruits, no JVD Abdomen: soft, positive BS, non-tender, non-distended, no organomegaly, not an acute abdomen GU: not examined Neuro: CN II - XII grossly intact,  Musculoskeletal: Moves all extremities equally, no clubbing, cyanosis or edema Skin: no rash, no subcutaneous crepitation, no decubitus Psych: Calm, confused patient   Labs on Admission:  Recent Labs    09/12/22 1610  NA 139  K 5.3*  CL 111  CO2 21*  GLUCOSE 196*  BUN 43*  CREATININE 4.67*  CALCIUM 10.0    Recent Labs    09/12/22 1610  WBC 6.4  NEUTROABS 4.7  HGB 8.9*  HCT 29.8*  MCV 87.4  PLT 230    Micro Results: Recent Results (from the past 240 hour(s))  Resp panel by RT-PCR (RSV, Flu A&B, Covid) Anterior Nasal Swab     Status: None   Collection Time: 09/12/22  4:15 PM   Specimen: Anterior Nasal Swab  Result Value Ref Range Status   SARS Coronavirus 2 by RT PCR NEGATIVE NEGATIVE Final   Influenza A by PCR NEGATIVE NEGATIVE Final   Influenza B by PCR NEGATIVE NEGATIVE Final    Comment: (NOTE) The Xpert  Xpress SARS-CoV-2/FLU/RSV plus assay is intended as an aid in the diagnosis of influenza from Nasopharyngeal swab specimens and should not be used as a sole basis for treatment. Nasal washings and aspirates are unacceptable for Xpert Xpress SARS-CoV-2/FLU/RSV testing.  Fact Sheet for Patients: EntrepreneurPulse.com.au  Fact Sheet for Healthcare Providers: IncredibleEmployment.be  This test is not yet approved or cleared by the [place] FDA and has been authorized for detection and/or diagnosis of SARS-CoV-2 by FDA under an Emergency Use Authorization (EUA). This EUA will remain in effect (meaning this test can be used) for the duration of the COVID-19 declaration under Section 564(b)(1) of the Act, 21 U.S.C. section 360bbb-3(b)(1), unless the authorization is terminated or revoked.     Resp Syncytial Virus by PCR NEGATIVE NEGATIVE Final    Comment: (NOTE) Fact Sheet for Patients: EntrepreneurPulse.com.au  Fact Sheet for Healthcare Providers: IncredibleEmployment.be  This test is not yet approved or cleared by the [place] FDA and has been authorized for detection and/or diagnosis of SARS-CoV-2 by FDA under an Emergency Use Authorization (EUA). This EUA will remain in effect (meaning this test can be used) for the duration of the COVID-19 declaration under Section 564(b)(1) of the Act, 21 U.S.C. section 360bbb-3(b)(1), unless the authorization is terminated or revoked.  Performed at [facility], [place] [address], [place], [place] [address]      Radiological Exams on Admission: CT HEAD WO CONTRAST (5MM)  Result Date: 09/12/2022 CLINICAL DATA:  Mental status change. EXAM: CT HEAD WITHOUT CONTRAST TECHNIQUE: Contiguous axial images were obtained from the base of the skull through the vertex without intravenous contrast. RADIATION DOSE REDUCTION: This exam was performed according to the  departmental dose-optimization program which includes automated exposure control, adjustment of the mA and/or kV according to patient size and/or use of iterative reconstruction technique. COMPARISON:  Brain CT 03/19/2022 FINDINGS: Brain: Ventricles and sulci are prominent compatible with atrophy. Periventricular and subcortical white matter hypodensities compatible with chronic microvascular ischemic changes. Bilateral basal ganglia calcifications. No evidence for acute cortically based infarct, intracranial hemorrhage, mass lesion or mass effect. Vascular: No hyperdense vessel or unexpected calcification. Skull: Normal. Negative for fracture or focal lesion. Sinuses/Orbits: Paranasal sinuses well aerated. Mastoid air cells unremarkable. Orbits are unremarkable. Other: None IMPRESSION: 1. No acute intracranial process. 2. Atrophy and chronic microvascular ischemic changes. Electronically Signed   By: Lovey Newcomer M.D.   On: 09/12/2022 16:33   DG Chest Portable 1 View  Result Date: 09/12/2022 CLINICAL DATA:  Altered mental status. EXAM: PORTABLE CHEST 1 VIEW COMPARISON:  07/21/2021 FINDINGS: The heart size and mediastinal contours are within normal limits. Both lungs are clear. The visualized skeletal structures are unremarkable. IMPRESSION: No active disease. Electronically Signed   By: Nolon Nations M.D.   On: 09/12/2022 16:19    Assessment/Plan Present on Admission:  Acute metabolic encephalopathy -Unclear etiology. DDx: Polypharmacy, hypertensive encephalopathy, vitamin deficiency, hypothyroidism, uremia, infection, withdrawal -TSH, Vit B12, folate, RPR, ammonia, HIV ordered -Nephrology consulted, ?  Could patient be uremic, causing confusion.  -Home BP meds resumed, IV Lopressor.  First dose of clonidine now, caution rebound hypertension -Blood and urine cultures collected.  Antibiotics not initiated.  No clear source of infection. -UDS, Tylenol level ordered -Patient on Roxicodone and  Flexeril.  Held for now.  Unclear how much or how little Roxicodone patient has been taking, question withdrawal.  UDS pending   Chronic kidney disease IV/metabolic acidosis/mild hyperkalemia -Home medications Rocaltrol, bicarb, Veltassa resumed -Nephrology consult placed, question uremia -Strict I/Os  DM  -Sliding scale insulin -Lantus resumed   CAD (coronary artery disease) -Pravastatin, metoprolol, aspirin, Norvasc resumed   Gout -Colchicine and Uloric resumed   Essential hypertension -Clonidine, Norvasc, metoprolol resumed  Emily Hughes 09/12/2022, 10:20 PM

## 2022-09-12 NOTE — ED Provider Notes (Signed)
[facility] Provider Note   CSN: [id number] Arrival date & time: 09/12/22  1513     History Chief Complaint  Patient presents with   Altered Mental Status    HPI Emily Hughes is a 82 y.o. female presenting for altered mental status.  See ED course below. HX of CAD/CKD/DM.HTN/HLD  Patient's recorded medical, surgical, social, medication list and allergies were reviewed in the Snapshot window as part of the initial history.   Review of Systems   Review of Systems  Unable to perform ROS: Mental status change    Physical Exam Updated Vital Signs BP (!) 178/94   Pulse 94   Temp (!) 97.5 F (36.4 C) (Oral)   Resp 15   Ht '5\' 3"'$  (1.6 m)   Wt 85.6 kg   SpO2 100%   BMI 33.43 kg/m  Physical Exam Vitals and nursing note reviewed.  Constitutional:      General: She is not in acute distress.    Appearance: She is well-developed.  HENT:     Head: Normocephalic and atraumatic.  Eyes:     Conjunctiva/sclera: Conjunctivae normal.  Cardiovascular:     Rate and Rhythm: Normal rate and regular rhythm.     Heart sounds: No murmur heard. Pulmonary:     Effort: Pulmonary effort is normal. No respiratory distress.     Breath sounds: Normal breath sounds.  Abdominal:     General: There is no distension.     Palpations: Abdomen is soft.     Tenderness: There is no abdominal tenderness. There is no right CVA tenderness or left CVA tenderness.  Musculoskeletal:        General: No swelling or tenderness. Normal range of motion.     Cervical back: Neck supple.  Skin:    General: Skin is warm and dry.  Neurological:     General: No focal deficit present.     Mental Status: She is alert. Mental status is at baseline. She is disoriented.     Cranial Nerves: No cranial nerve deficit.     Motor: No weakness.      ED Course/ Medical Decision Making/ A&P Clinical Course as of 09/12/22 2333  [name] Sep 12, 2022  1822 Per sister she has  been confused and she has had bilateral leg swelling She has fallen multiple times this week Recent admission last year for cellulitis and AMS requiring admission and SNF She needs help   [CC]  2011 DG Chest Portable 1 View [CC]  2151 CT HEAD WO CONTRAST (5MM) [CC]    Clinical Course User Index [CC] Tretha Sciara, MD    Procedures Procedures   Medications Ordered in ED Medications  aspirin EC tablet 81 mg (has no administration in time range)  insulin aspart (novoLOG) injection 0-15 Units (has no administration in time range)  insulin aspart (novoLOG) injection 0-5 Units ( Subcutaneous Not Given 09/12/22 2249)  cloNIDine (CATAPRES) tablet 0.1 mg (has no administration in time range)  calcitRIOL (ROCALTROL) capsule 0.25 mcg (has no administration in time range)  colchicine tablet 0.6 mg (has no administration in time range)  diltiazem (CARDIZEM CD) 24 hr capsule 300 mg (has no administration in time range)  levothyroxine (SYNTHROID) tablet 50 mcg (has no administration in time range)  pantoprazole (PROTONIX) EC tablet 40 mg (has no administration in time range)  sodium bicarbonate tablet 650 mg (has no administration in time range)  sucralfate (CARAFATE) tablet 1 g (has no administration  in time range)  patiromer Daryll Drown) packet 8.4 g (has no administration in time range)  lactated ringers bolus 1,000 mL (0 mLs Intravenous Stopped 09/12/22 2250)  lactated ringers bolus 1,000 mL (1,000 mLs Intravenous New Bag/Given 09/12/22 2222)   Medical Decision Making:   [name] is a 82 y.o. female who presented to the ED today with altered mental status detailed above.    Patient's presentation is complicated by their history of multiple comorbid medical problems.  Patient placed on continuous vitals and telemetry monitoring while in ED which was reviewed periodically.  Complete initial physical exam performed, notably the patient  was hemodynamically stable in no acute distress.     Reviewed and confirmed nursing documentation for past medical history, family history, social history.    Initial Assessment:   With the patient's presentation of altered mental status, most likely diagnosis is delerium 2/2 infectious etiology (UTI/CAP/URI) vs metabolic abnormality (Na/K/Mg/Ca) vs nonspecific etiology. Other diagnoses were considered including (but not limited to) CVA, ICH, intracranial mass, critical dehydration, heptatic dysfunction, uremia, hypercarbia, intoxication, endrocrine abnormality, toxidrome. These are considered less likely due to history of present illness and physical exam findings.   This is most consistent with an acute life/limb threatening illness complicated by underlying chronic conditions.  Initial Plan:  CTH to evaluate for intracranial etiology of patient's symptoms  Screening labs including CBC and Metabolic panel to evaluate for infectious or metabolic etiology of disease.  Urinalysis with reflex culture ordered to evaluate for UTI or relevant urologic/nephrologic pathology.  CXR to evaluate for structural/infectious intrathoracic pathology.  EKG to evaluate for cardiac pathology Objective evaluation as below reviewed   Initial Study Results:   Laboratory  All laboratory results reviewed without evidence of clinically relevant pathology.   EKG EKG was reviewed independently. Rate, rhythm, axis, intervals all examined and without medically relevant abnormality. ST segments without concerns for elevations.    Radiology:  All images reviewed independently.  Agree with radiology report at this time.   CT HEAD WO CONTRAST (5MM)  Result Date: 09/12/2022 CLINICAL DATA:  Mental status change. EXAM: CT HEAD WITHOUT CONTRAST TECHNIQUE: Contiguous axial images were obtained from the base of the skull through the vertex without intravenous contrast. RADIATION DOSE REDUCTION: This exam was performed according to the departmental dose-optimization program which  includes automated exposure control, adjustment of the mA and/or kV according to patient size and/or use of iterative reconstruction technique. COMPARISON:  Brain CT 03/19/2022 FINDINGS: Brain: Ventricles and sulci are prominent compatible with atrophy. Periventricular and subcortical white matter hypodensities compatible with chronic microvascular ischemic changes. Bilateral basal ganglia calcifications. No evidence for acute cortically based infarct, intracranial hemorrhage, mass lesion or mass effect. Vascular: No hyperdense vessel or unexpected calcification. Skull: Normal. Negative for fracture or focal lesion. Sinuses/Orbits: Paranasal sinuses well aerated. Mastoid air cells unremarkable. Orbits are unremarkable. Other: None IMPRESSION: 1. No acute intracranial process. 2. Atrophy and chronic microvascular ischemic changes. Electronically Signed   By: Lovey Newcomer M.D.   On: 09/12/2022 16:33   DG Chest Portable 1 View  Result Date: 09/12/2022 CLINICAL DATA:  Altered mental status. EXAM: PORTABLE CHEST 1 VIEW COMPARISON:  07/21/2021 FINDINGS: The heart size and mediastinal contours are within normal limits. Both lungs are clear. The visualized skeletal structures are unremarkable. IMPRESSION: No active disease. Electronically Signed   By: Nolon Nations M.D.   On: 09/12/2022 16:19    Final Assessment and Plan:   Reassessed after extensive workup.  No acute pathology detected the  patient remains grossly confused.  Called the sister, they do not feel that she is safe at home because of the level of care that she would require, she does not have any home caregivers that she is normally completely functional.  Last time she was this confused there was an underlying infection. Consulted hospitalist for further recommendations who agreed with need for admission.  Disposition:   Based on the above findings, I believe this patient is stable for admission.    Patient/family educated about specific  findings on our evaluation and explained exact reasons for admission.  Patient/family educated about clinical situation and time was allowed to answer questions.   Admission team communicated with and agreed with need for admission. Patient admitted. Patient ready to move at this time.     Emergency Department Medication Summary:   Medications  aspirin EC tablet 81 mg (has no administration in time range)  insulin aspart (novoLOG) injection 0-15 Units (has no administration in time range)  insulin aspart (novoLOG) injection 0-5 Units ( Subcutaneous Not Given 09/12/22 2249)  cloNIDine (CATAPRES) tablet 0.1 mg (has no administration in time range)  calcitRIOL (ROCALTROL) capsule 0.25 mcg (has no administration in time range)  colchicine tablet 0.6 mg (has no administration in time range)  diltiazem (CARDIZEM CD) 24 hr capsule 300 mg (has no administration in time range)  levothyroxine (SYNTHROID) tablet 50 mcg (has no administration in time range)  pantoprazole (PROTONIX) EC tablet 40 mg (has no administration in time range)  sodium bicarbonate tablet 650 mg (has no administration in time range)  sucralfate (CARAFATE) tablet 1 g (has no administration in time range)  patiromer Daryll Drown) packet 8.4 g (has no administration in time range)  lactated ringers bolus 1,000 mL (0 mLs Intravenous Stopped 09/12/22 2250)  lactated ringers bolus 1,000 mL (1,000 mLs Intravenous New Bag/Given 09/12/22 2222)          Clinical Impression:  1. Altered mental status, unspecified altered mental status type      Admit   Final Clinical Impression(s) / ED Diagnoses Final diagnoses:  Altered mental status, unspecified altered mental status type    Rx / DC Orders ED Discharge Orders     None         Tretha Sciara, MD 09/12/22 2333

## 2022-09-12 NOTE — ED Notes (Signed)
Attempted to draw labs, unsuccessful.

## 2022-09-12 NOTE — ED Triage Notes (Signed)
BIBA after family founf pt still in bed tthis afternoon, has been confused for 2-3 days, and weaker than normal.  Lives alone, is usually Aox4 and ambulates with walker.

## 2022-09-12 NOTE — ED Notes (Signed)
[phone] pt sister Stanton Kidney called for a update

## 2022-09-13 ENCOUNTER — Inpatient Hospital Stay (HOSPITAL_COMMUNITY)
Admit: 2022-09-13 | Discharge: 2022-09-13 | Disposition: A | Discharge status: Home / Self Care | Payer: Medicare Other | Attending: Family Medicine | Admitting: Family Medicine

## 2022-09-13 DIAGNOSIS — R4182 Altered mental status, unspecified: Secondary | ICD-10-CM | POA: Diagnosis not present

## 2022-09-13 DIAGNOSIS — I2510 Atherosclerotic heart disease of native coronary artery without angina pectoris: Secondary | ICD-10-CM | POA: Diagnosis not present

## 2022-09-13 DIAGNOSIS — N184 Chronic kidney disease, stage 4 (severe): Secondary | ICD-10-CM | POA: Diagnosis not present

## 2022-09-13 DIAGNOSIS — R569 Unspecified convulsions: Secondary | ICD-10-CM

## 2022-09-13 DIAGNOSIS — G9341 Metabolic encephalopathy: Secondary | ICD-10-CM | POA: Diagnosis not present

## 2022-09-13 DIAGNOSIS — D631 Anemia in chronic kidney disease: Secondary | ICD-10-CM

## 2022-09-13 DIAGNOSIS — I10 Essential (primary) hypertension: Secondary | ICD-10-CM | POA: Diagnosis not present

## 2022-09-13 LAB — GLUCOSE, CAPILLARY
Glucose-Capillary: 146 mg/dL — ABNORMAL HIGH (ref 70–99)
Glucose-Capillary: 148 mg/dL — ABNORMAL HIGH (ref 70–99)
Glucose-Capillary: 156 mg/dL — ABNORMAL HIGH (ref 70–99)

## 2022-09-13 LAB — RAPID URINE DRUG SCREEN, HOSP PERFORMED
Amphetamines: NOT DETECTED
Barbiturates: NOT DETECTED
Benzodiazepines: NOT DETECTED
Cocaine: NOT DETECTED
Opiates: NOT DETECTED
Tetrahydrocannabinol: NOT DETECTED

## 2022-09-13 LAB — CBC
HCT: 29 % — ABNORMAL LOW (ref 36.0–46.0)
Hemoglobin: 9.2 g/dL — ABNORMAL LOW (ref 12.0–15.0)
MCH: 27.2 pg (ref 26.0–34.0)
MCHC: 31.7 g/dL (ref 30.0–36.0)
MCV: 85.8 fL (ref 80.0–100.0)
Platelets: 224 K/uL (ref 150–400)
RBC: 3.38 MIL/uL — ABNORMAL LOW (ref 3.87–5.11)
RDW: 16.3 % — ABNORMAL HIGH (ref 11.5–15.5)
WBC: 6.7 K/uL (ref 4.0–10.5)
nRBC: 0 % (ref 0.0–0.2)

## 2022-09-13 LAB — CBC WITH DIFFERENTIAL/PLATELET
Abs Immature Granulocytes: 0.02 K/uL (ref 0.00–0.07)
Basophils Absolute: 0 K/uL (ref 0.0–0.1)
Basophils Relative: 1 %
Eosinophils Absolute: 0 K/uL (ref 0.0–0.5)
Eosinophils Relative: 0 %
HCT: 26.2 % — ABNORMAL LOW (ref 36.0–46.0)
Hemoglobin: 8.1 g/dL — ABNORMAL LOW (ref 12.0–15.0)
Immature Granulocytes: 0 %
Lymphocytes Relative: 15 %
Lymphs Abs: 0.9 K/uL (ref 0.7–4.0)
MCH: 27.2 pg (ref 26.0–34.0)
MCHC: 30.9 g/dL (ref 30.0–36.0)
MCV: 87.9 fL (ref 80.0–100.0)
Monocytes Absolute: 0.3 K/uL (ref 0.1–1.0)
Monocytes Relative: 5 %
Neutro Abs: 5.1 K/uL (ref 1.7–7.7)
Neutrophils Relative %: 79 %
Platelets: 216 K/uL (ref 150–400)
RBC: 2.98 MIL/uL — ABNORMAL LOW (ref 3.87–5.11)
RDW: 16.4 % — ABNORMAL HIGH (ref 11.5–15.5)
WBC: 6.4 K/uL (ref 4.0–10.5)
nRBC: 0 % (ref 0.0–0.2)

## 2022-09-13 LAB — CK: Total CK: 175 U/L (ref 38–234)

## 2022-09-13 LAB — ACETAMINOPHEN LEVEL: Acetaminophen (Tylenol), Serum: <10 ug/mL — ABNORMAL LOW (ref 10–30)

## 2022-09-13 LAB — BASIC METABOLIC PANEL
Anion gap: 9 (ref 5–15)
BUN: 39 mg/dL — ABNORMAL HIGH (ref 8–23)
CO2: 17 mmol/L — ABNORMAL LOW (ref 22–32)
Calcium: 9.4 mg/dL (ref 8.9–10.3)
Chloride: 115 mmol/L — ABNORMAL HIGH (ref 98–111)
Creatinine, Ser: 4.26 mg/dL — ABNORMAL HIGH (ref 0.44–1.00)
GFR, Estimated: 10 mL/min — ABNORMAL LOW (ref >60)
Glucose, Bld: 162 mg/dL — ABNORMAL HIGH (ref 70–99)
Potassium: 5.3 mmol/L — ABNORMAL HIGH (ref 3.5–5.1)
Sodium: 141 mmol/L (ref 135–145)

## 2022-09-13 LAB — CBG MONITORING, ED
Glucose-Capillary: 128 mg/dL — ABNORMAL HIGH (ref 70–99)
Glucose-Capillary: 180 mg/dL — ABNORMAL HIGH (ref 70–99)

## 2022-09-13 LAB — BRAIN NATRIURETIC PEPTIDE: B Natriuretic Peptide: 308.6 pg/mL — ABNORMAL HIGH (ref 0.0–100.0)

## 2022-09-13 LAB — TSH: TSH: 2.991 u[IU]/mL (ref 0.350–4.500)

## 2022-09-13 LAB — CREATININE, SERUM
Creatinine, Ser: 4.44 mg/dL — ABNORMAL HIGH (ref 0.44–1.00)
GFR, Estimated: 10 mL/min — ABNORMAL LOW

## 2022-09-13 LAB — RPR: RPR Ser Ql: NONREACTIVE

## 2022-09-13 LAB — HIV ANTIBODY (ROUTINE TESTING W REFLEX): HIV Screen 4th Generation wRfx: NONREACTIVE

## 2022-09-13 LAB — VITAMIN B12: Vitamin B-12: 220 pg/mL (ref 180–914)

## 2022-09-13 LAB — AMMONIA: Ammonia: 24 umol/L (ref 9–35)

## 2022-09-13 MED ORDER — METOPROLOL SUCCINATE ER 50 MG PO TB24
50.0000 mg | ORAL_TABLET | Freq: Every day | ORAL | Status: DC
  Administered 2022-09-13 – 2022-09-16 (×3): 50 mg via ORAL
  Filled 2022-09-13 (×3): qty 1

## 2022-09-13 MED ORDER — ONDANSETRON HCL 4 MG PO TABS: 4.0000 mg | ORAL_TABLET | Freq: Four times a day (QID) | ORAL | Status: DC | PRN

## 2022-09-13 MED ORDER — ACETAMINOPHEN 650 MG RE SUPP: 650.0000 mg | Freq: Four times a day (QID) | RECTAL | Status: DC | PRN

## 2022-09-13 MED ORDER — FERROUS SULFATE 325 (65 FE) MG PO TABS
325.0000 mg | ORAL_TABLET | Freq: Two times a day (BID) | ORAL | Status: DC
  Administered 2022-09-14 – 2022-09-16 (×5): 325 mg via ORAL
  Filled 2022-09-13 (×6): qty 1

## 2022-09-13 MED ORDER — HEPARIN SODIUM (PORCINE) 5000 UNIT/ML IJ SOLN
5000.0000 [IU] | Freq: Three times a day (TID) | INTRAMUSCULAR | Status: DC
  Administered 2022-09-13 – 2022-09-16 (×12): 5000 [IU] via SUBCUTANEOUS
  Filled 2022-09-13 (×11): qty 1

## 2022-09-13 MED ORDER — ACETAMINOPHEN 325 MG PO TABS: 650.0000 mg | ORAL_TABLET | Freq: Four times a day (QID) | ORAL | Status: DC | PRN

## 2022-09-13 MED ORDER — FUROSEMIDE 40 MG PO TABS
40.0000 mg | ORAL_TABLET | Freq: Every evening | ORAL | Status: DC
  Administered 2022-09-14 – 2022-09-15 (×2): 40 mg via ORAL
  Filled 2022-09-13 (×2): qty 1

## 2022-09-13 MED ORDER — CLONIDINE HCL 0.1 MG PO TABS
0.1000 mg | ORAL_TABLET | Freq: Two times a day (BID) | ORAL | Status: DC
  Administered 2022-09-13 – 2022-09-16 (×7): 0.1 mg via ORAL
  Filled 2022-09-13 (×7): qty 1

## 2022-09-13 MED ORDER — CALCITRIOL 0.25 MCG PO CAPS
0.2500 ug | ORAL_CAPSULE | Freq: Every day | ORAL | Status: DC
  Administered 2022-09-13 – 2022-09-16 (×4): 0.25 ug via ORAL
  Filled 2022-09-13 (×4): qty 1

## 2022-09-13 MED ORDER — PATIROMER SORBITEX CALCIUM 8.4 G PO PACK
8.4000 g | PACK | Freq: Every day | ORAL | Status: DC
  Administered 2022-09-13: 8.4 g via ORAL
  Filled 2022-09-13 (×3): qty 1

## 2022-09-13 MED ORDER — SODIUM BICARBONATE 650 MG PO TABS
650.0000 mg | ORAL_TABLET | Freq: Three times a day (TID) | ORAL | Status: DC
  Administered 2022-09-13 – 2022-09-16 (×12): 650 mg via ORAL
  Filled 2022-09-13 (×14): qty 1

## 2022-09-13 MED ORDER — ONDANSETRON HCL 4 MG/2ML IJ SOLN: 4.0000 mg | Freq: Four times a day (QID) | INTRAMUSCULAR | Status: DC | PRN

## 2022-09-13 MED ORDER — HYDRALAZINE HCL 20 MG/ML IJ SOLN: 10.0000 mg | Freq: Once | INTRAMUSCULAR | Status: DC

## 2022-09-13 MED ORDER — LEVOTHYROXINE SODIUM 50 MCG PO TABS
50.0000 ug | ORAL_TABLET | Freq: Every day | ORAL | Status: DC
  Administered 2022-09-14 – 2022-09-16 (×3): 50 ug via ORAL
  Filled 2022-09-13 (×3): qty 1
  Filled 2022-09-13: qty 2

## 2022-09-13 MED ORDER — COLCHICINE 0.6 MG PO TABS
0.6000 mg | ORAL_TABLET | Freq: Every day | ORAL | Status: DC
  Administered 2022-09-13 – 2022-09-15 (×3): 0.6 mg via ORAL
  Filled 2022-09-13 (×3): qty 1

## 2022-09-13 MED ORDER — FUROSEMIDE 40 MG PO TABS
80.0000 mg | ORAL_TABLET | ORAL | Status: DC
  Administered 2022-09-14 – 2022-09-16 (×3): 80 mg via ORAL
  Filled 2022-09-13 (×3): qty 2

## 2022-09-13 MED ORDER — HYDRALAZINE HCL 20 MG/ML IJ SOLN
5.0000 mg | Freq: Once | INTRAMUSCULAR | Status: AC
  Administered 2022-09-13: 5 mg via INTRAVENOUS
  Filled 2022-09-13: qty 1

## 2022-09-13 MED ORDER — DILTIAZEM HCL ER COATED BEADS 180 MG PO CP24
300.0000 mg | ORAL_CAPSULE | Freq: Every day | ORAL | Status: DC
  Administered 2022-09-13 – 2022-09-16 (×4): 300 mg via ORAL
  Filled 2022-09-13 (×4): qty 1

## 2022-09-13 MED ORDER — INSULIN GLARGINE-YFGN 100 UNIT/ML ~~LOC~~ SOLN
6.0000 [IU] | Freq: Every day | SUBCUTANEOUS | Status: DC
  Administered 2022-09-13 – 2022-09-16 (×4): 6 [IU] via SUBCUTANEOUS
  Filled 2022-09-13 (×4): qty 0.06

## 2022-09-13 MED ORDER — SUCRALFATE 1 G PO TABS
1.0000 g | ORAL_TABLET | Freq: Three times a day (TID) | ORAL | Status: DC
  Administered 2022-09-13 – 2022-09-16 (×12): 1 g via ORAL
  Filled 2022-09-13 (×14): qty 1

## 2022-09-13 NOTE — Plan of Care (Signed)

## 2022-09-13 NOTE — Consult Note (Addendum)
[facility] Renal Consultation Note  Requesting MD: Crosley Indication for Consultation: Concern for uremic encephalopathy  HPI:  Emily Hughes is a 82 y.o. female with pertinent PMH of CKD stage 4, HTN, type 2 diabetes who presented to [facility] ED after family noted her to be increasingly confused.   Initial work-up in ED was largely unrevealing. Renal function was at apparent baseline. She was hypertensive to 201/104 but this responded to medication.   Family denies diagnosis of dementia; chart review reveals PCP OV note in 2017 that mentions possibility of mild dementia. At her most recent PCP visit, he ordered her a walker and power chair as she has had increasing number of falls but family has not been able to pick this up yet. She has had an episode like this just once other time when she had an infection and was hospitalized. She normally distributes her own medications however family is skeptical that she has been taking them over the last several days.  Nephrology consulted today due to concerns of uremia as possible source of acute encephalopathy.  PMHx: Past Medical History:  Diagnosis Date   Anemia    CAD (coronary artery disease)    Colon polyps    CRI (chronic renal insufficiency)    DDD (degenerative disc disease)    Diabetes mellitus    INSULIN DEPENDENT   GERD (gastroesophageal reflux disease)    High cholesterol    Hypertension    Pancreatitis    Proteinuria    Past Surgical History:  Procedure Laterality Date   ABDOMINAL HYSTERECTOMY     ANGIOPLASTY     ANKLE SURGERY Right    BACK SURGERY     INCISION AND DRAINAGE ABSCESS N/A 11/09/2018   Procedure: INCISION AND DRAINAGE ABSCESS;  Surgeon: Coralie Keens, MD;  Location: [facility];  Service: General;  Laterality: N/A;   TONSILLECTOMY     Family Hx:  Family History  Problem Relation Age of Onset   Diabetes Mother    Diabetes Father    Diabetes Sister    Social History:  reports that she has  quit smoking. She has never used smokeless tobacco. She reports that she does not drink alcohol and does not use drugs.  Allergies:  Allergies  Allergen Reactions   5-Alpha Reductase Inhibitors    Ace Inhibitors    Actos [Pioglitazone Hydrochloride]    Angiotensin Receptor Blockers Hives   Dust Mite Extract    Dye Fdc Red [Fd&C Red #3 (Erythrosine Sodium)]    Fish Allergy    Iodinated Contrast Media Hives   Pioglitazone     Other reaction(s): swelling   Medications: Prior to Admission medications   Medication Sig Start Date End Date Taking? Authorizing Provider  acetaminophen (TYLENOL) 325 MG tablet Take 2 tablets (650 mg total) by mouth every 6 (six) hours as needed for mild pain (or Fever >/= 101). 11/29/18   Georgette Shell, MD  amLODipine (NORVASC) 10 MG tablet Take 10 mg by mouth daily. 03/23/21   [provider]  aspirin 81 MG tablet Take 81 mg by mouth daily.    [provider]  B-D ULTRAFINE III SHORT PEN 31G X 8 MM MISC USE WITH LANTUS. 07/31/18   Susy Frizzle, MD  calcitRIOL (ROCALTROL) 0.25 MCG capsule Take 0.25 mcg by mouth daily.    [provider]  cephALEXin (KEFLEX) 500 MG capsule Take 1 capsule (500 mg total) by mouth 4 (four) times daily. 04/02/22   Susy Frizzle,  MD  cloNIDine (CATAPRES) 0.1 MG tablet Take 0.1 mg by mouth 2 (two) times daily.  12/06/18   [provider]  colchicine 0.6 MG tablet TAKE ONE TABLET BY MOUTH DAILY. 07/17/21   Susy Frizzle, MD  collagenase (SANTYL) ointment Apply topically daily. 11/29/18   Georgette Shell, MD  cyclobenzaprine (FLEXERIL) 5 MG tablet Take 1 tablet (5 mg total) by mouth 3 (three) times daily as needed for muscle spasms. 07/14/20   Susy Frizzle, MD  diclofenac Sodium (VOLTAREN) 1 % GEL Apply 2 g topically 4 (four) times daily. 07/23/21   Susy Frizzle, MD  diltiazem (CARDIZEM CD) 300 MG 24 hr capsule Take 1 capsule (300 mg total) by mouth daily. 01/01/20   Hernando,  Modena Nunnery, MD  diltiazem [facility]) 300 MG 24 hr capsule Take 300 mg by mouth daily. 12/08/20   [provider]  famotidine (PEPCID) 40 MG tablet Take 1 tablet (40 mg total) by mouth at bedtime. 08/24/22   Susy Frizzle, MD  febuxostat (ULORIC) 40 MG tablet Take 1 tablet (40 mg total) by mouth daily. 11/29/18   Georgette Shell, MD  FLUAD QUADRIVALENT 0.5 ML injection  05/20/22   [provider]  furosemide (LASIX) 40 MG tablet TAKE 2 TABLETS IN THE MORNING AND 1 TABLET IN THE EVENING. 01/23/19   Susy Frizzle, MD  HYDROcodone bit-homatropine (HYCODAN) 5-1.5 MG/5ML syrup Take 5 mLs by mouth every 8 (eight) hours as needed for cough. 03/10/21   Susy Frizzle, MD  insulin lispro (HUMALOG) 100 UNIT/ML injection INJECT 0.1ML (10 UNITS) INTO SKIN DAILY WITH LUNCH 07/23/22   Susy Frizzle, MD  Iron, Ferrous Sulfate, 325 (65 Fe) MG TABS Take 325 mg by mouth 2 (two) times daily. 01/23/21   Susy Frizzle, MD  LANTUS SOLOSTAR 100 UNIT/ML Solostar Pen Inject 10 Units into the skin daily. 09/01/22   Susy Frizzle, MD  levothyroxine (SYNTHROID) 50 MCG tablet TAKE 1 TABLET BY MOUTH DAILY 04/19/22   Rubie Maid, FNP  metoprolol succinate (TOPROL-XL) 50 MG 24 hr tablet TAKE 1 TABLET BY MOUTH  DAILY WITH OR IMMEDIATELY  FOLLOWING A MEAL 05/25/22   Susy Frizzle, MD  oxyCODONE (OXY IR/ROXICODONE) 5 MG immediate release tablet Take 1 tablet (5 mg total) by mouth every 6 (six) hours as needed for moderate pain. 11/29/18   Georgette Shell, MD  pantoprazole (PROTONIX) 40 MG tablet Take 1 tablet (40 mg total) by mouth daily. 08/26/22   Susy Frizzle, MD  polyethylene glycol (MIRALAX / GLYCOLAX) 17 g packet Take 17 g by mouth daily. 11/29/18   Georgette Shell, MD  pravastatin (PRAVACHOL) 80 MG tablet Take 1 tablet (80 mg total) by mouth at bedtime. 02/16/19   Susy Frizzle, MD  senna (SENOKOT) 8.6 MG TABS tablet Take 2 tablets (17.2 mg total) by mouth at bedtime.  11/29/18   Georgette Shell, MD  sodium bicarbonate 650 MG tablet Take 1 tablet (650 mg total) by mouth 4 (four) times daily. 07/19/14   Susy Frizzle, MD  sucralfate (CARAFATE) 1 G tablet Take 1 tablet (1 g total) by mouth 4 (four) times daily -  with meals and at bedtime. 07/11/14   Susy Frizzle, MD  VELTASSA 8.4 g packet Take 8.4 g by mouth daily.  12/20/17   [provider]    I have reviewed the patient's current medications.  Labs:  Results for orders placed or performed  during the hospital encounter of 09/12/22 (from the past 48 hour(s))  Urinalysis, Routine w reflex microscopic -Urine, Clean Catch     Status: Abnormal   Collection Time: 09/12/22  3:52 PM  Result Value Ref Range   Color, Urine YELLOW YELLOW   APPearance CLEAR CLEAR   Specific Gravity, Urine 1.014 1.005 - 1.030   pH 6.0 5.0 - 8.0   Glucose, UA 150 (A) NEGATIVE mg/dL   Hgb urine dipstick SMALL (A) NEGATIVE   Bilirubin Urine NEGATIVE NEGATIVE   Ketones, ur NEGATIVE NEGATIVE mg/dL   Protein, ur >=300 (A) NEGATIVE mg/dL   Nitrite NEGATIVE NEGATIVE   Leukocytes,Ua NEGATIVE NEGATIVE   RBC / HPF 0-5 0 - 5 RBC/hpf   WBC, UA 0-5 0 - 5 WBC/hpf   Bacteria, UA RARE (A) NONE SEEN   Squamous Epithelial / HPF 0-5 0 - 5 /HPF   Mucus PRESENT     Comment: Performed at [facility], [address] [place], [place] [address]  Rapid urine drug screen (hospital performed)     Status: None   Collection Time: 09/12/22  3:52 PM  Result Value Ref Range   Opiates NONE DETECTED NONE DETECTED   Cocaine NONE DETECTED NONE DETECTED   Benzodiazepines NONE DETECTED NONE DETECTED   Amphetamines NONE DETECTED NONE DETECTED   Tetrahydrocannabinol NONE DETECTED NONE DETECTED   Barbiturates NONE DETECTED NONE DETECTED    Comment: (NOTE) DRUG SCREEN FOR MEDICAL PURPOSES ONLY.  IF CONFIRMATION IS NEEDED FOR ANY PURPOSE, NOTIFY LAB WITHIN 5 DAYS.  LOWEST DETECTABLE LIMITS FOR URINE DRUG SCREEN Drug Class                      Cutoff (ng/mL) Amphetamine and metabolites    1000 Barbiturate and metabolites    200 Benzodiazepine                 200 Opiates and metabolites        300 Cocaine and metabolites        300 THC                            50 Performed at [facility], [place] [address], [place], [place] [address]   CBC with Differential     Status: Abnormal   Collection Time: 09/12/22  4:10 PM  Result Value Ref Range   WBC 6.4 4.0 - 10.5 K/uL   RBC 3.41 (L) 3.87 - 5.11 MIL/uL   Hemoglobin 8.9 (L) 12.0 - 15.0 g/dL   HCT 29.8 (L) 36.0 - 46.0 %   MCV 87.4 80.0 - 100.0 fL   MCH 26.1 26.0 - 34.0 pg   MCHC 29.9 (L) 30.0 - 36.0 g/dL   RDW 16.4 (H) 11.5 - 15.5 %   Platelets 230 150 - 400 K/uL   nRBC 0.0 0.0 - 0.2 %   Neutrophils Relative % 74 %   Neutro Abs 4.7 1.7 - 7.7 K/uL   Lymphocytes Relative 16 %   Lymphs Abs 1.1 0.7 - 4.0 K/uL   Monocytes Relative 8 %   Monocytes Absolute 0.5 0.1 - 1.0 K/uL   Eosinophils Relative 1 %   Eosinophils Absolute 0.1 0.0 - 0.5 K/uL   Basophils Relative 1 %   Basophils Absolute 0.0 0.0 - 0.1 K/uL   Immature Granulocytes 0 %   Abs Immature Granulocytes 0.02 0.00 - 0.07 K/uL    Comment: Performed  at [facility], [place] [address] [place], [place] [id number]  Basic metabolic panel     Status: Abnormal   Collection Time: 09/12/22  4:10 PM  Result Value Ref Range   Sodium 139 135 - 145 mmol/L   Potassium 5.3 (H) 3.5 - 5.1 mmol/L   Chloride 111 98 - 111 mmol/L   CO2 21 (L) 22 - 32 mmol/L   Glucose, Bld 196 (H) 70 - 99 mg/dL    Comment: Glucose reference range applies only to samples taken after fasting for at least 8 hours.   BUN 43 (H) 8 - 23 mg/dL   Creatinine, Ser 4.67 (H) 0.44 - 1.00 mg/dL   Calcium 10.0 8.9 - 10.3 mg/dL   GFR, Estimated 9 (L) >60 mL/min    Comment: (NOTE) Calculated using the CKD-EPI Creatinine Equation (2021)    Anion gap 7 5 - 15    Comment: Performed at [facility] [address] [place], [place] [address]   Resp panel by RT-PCR (RSV, Flu A&B, Covid) Anterior Nasal Swab     Status: None   Collection Time: 09/12/22  4:15 PM   Specimen: Anterior Nasal Swab  Result Value Ref Range   SARS Coronavirus 2 by RT PCR NEGATIVE NEGATIVE   Influenza A by PCR NEGATIVE NEGATIVE   Influenza B by PCR NEGATIVE NEGATIVE    Comment: (NOTE) The Xpert Xpress SARS-CoV-2/FLU/RSV plus assay is intended as an aid in the diagnosis of influenza from Nasopharyngeal swab specimens and should not be used as a sole basis for treatment. Nasal washings and aspirates are unacceptable for Xpert Xpress SARS-CoV-2/FLU/RSV testing.  Fact Sheet for Patients: EntrepreneurPulse.com.au  Fact Sheet for Healthcare Providers: IncredibleEmployment.be  This test is not yet approved or cleared by the [place] FDA and has been authorized for detection and/or diagnosis of SARS-CoV-2 by FDA under an Emergency Use Authorization (EUA). This EUA will remain in effect (meaning this test can be used) for the duration of the COVID-19 declaration under Section 564(b)(1) of the Act, 21 U.S.C. section 360bbb-3(b)(1), unless the authorization is terminated or revoked.     Resp Syncytial Virus by PCR NEGATIVE NEGATIVE    Comment: (NOTE) Fact Sheet for Patients: EntrepreneurPulse.com.au  Fact Sheet for Healthcare Providers: IncredibleEmployment.be  This test is not yet approved or cleared by the [place] FDA and has been authorized for detection and/or diagnosis of SARS-CoV-2 by FDA under an Emergency Use Authorization (EUA). This EUA will remain in effect (meaning this test can be used) for the duration of the COVID-19 declaration under Section 564(b)(1) of the Act, 21 U.S.C. section 360bbb-3(b)(1), unless the authorization is terminated or revoked.  Performed at [facility], [place] [address], [place], [place] [address]   CBG monitoring, ED      Status: Abnormal   Collection Time: 09/12/22 10:33 PM  Result Value Ref Range   Glucose-Capillary 133 (H) 70 - 99 mg/dL    Comment: Glucose reference range applies only to samples taken after fasting for at least 8 hours.   Comment 1 Notify RN   Vitamin B12     Status: None   Collection Time: 09/13/22 12:24 AM  Result Value Ref Range   Vitamin B-12 220 180 - 914 pg/mL    Comment: (NOTE) This assay is not validated for testing neonatal or myeloproliferative syndrome specimens for Vitamin B12 levels. Performed at [facility], [place] [address], [place], [place] [address]   TSH  Status: None   Collection Time: 09/13/22 12:24 AM  Result Value Ref Range   TSH 2.991 0.350 - 4.500 uIU/mL    Comment: Performed by a 3rd Generation assay with a functional sensitivity of <=0.01 uIU/mL. Performed at [facility], [place] [address] [place], [place] [address]   RPR     Status: None   Collection Time: 09/13/22 12:24 AM  Result Value Ref Range   RPR Ser Ql NON REACTIVE NON REACTIVE    Comment: Performed at [facility], [place] [address], [place], [place] [address]  HIV Antibody (routine testing w rflx)     Status: None   Collection Time: 09/13/22 12:24 AM  Result Value Ref Range   HIV Screen 4th Generation wRfx Non Reactive Non Reactive    Comment: Performed at [facility], [place] [address] [place], [place] [address]  Brain natriuretic peptide     Status: Abnormal   Collection Time: 09/13/22 12:24 AM  Result Value Ref Range   B Natriuretic Peptide 308.6 (H) 0.0 - 100.0 pg/mL    Comment: Performed at [facility] [address], [place], [place] [address]  CBC     Status: Abnormal   Collection Time: 09/13/22 12:24 AM  Result Value Ref Range   WBC 6.7 4.0 - 10.5 K/uL   RBC 3.38 (L) 3.87 - 5.11 MIL/uL   Hemoglobin 9.2 (L) 12.0 - 15.0 g/dL   HCT 29.0 (L) 36.0 - 46.0 %   MCV 85.8 80.0 - 100.0 fL   MCH 27.2 26.0 - 34.0 pg   MCHC 31.7 30.0 - 36.0 g/dL   RDW 16.3  (H) 11.5 - 15.5 %   Platelets 224 150 - 400 K/uL   nRBC 0.0 0.0 - 0.2 %    Comment: Performed at [facility], [place] [address] [place], [place] [address]  CK     Status: None   Collection Time: 09/13/22 12:24 AM  Result Value Ref Range   Total CK 175 38 - 234 U/L    Comment: Performed at [facility], [place] [address] [place], [place] [address]  Creatinine, serum     Status: Abnormal   Collection Time: 09/13/22 12:24 AM  Result Value Ref Range   Creatinine, Ser 4.44 (H) 0.44 - 1.00 mg/dL   GFR, Estimated 10 (L) >60 mL/min    Comment: (NOTE) Calculated using the CKD-EPI Creatinine Equation (2021) Performed at [facility] [address] [place], [place] [address]   Ammonia     Status: None   Collection Time: 09/13/22 12:29 AM  Result Value Ref Range   Ammonia 24 9 - 35 umol/L    Comment: HEMOLYSIS AT THIS LEVEL MAY AFFECT RESULT Performed at [facility], [place] [address], [place], [place] [address]   CBG monitoring, ED     Status: Abnormal   Collection Time: 09/13/22 12:56 AM  Result Value Ref Range   Glucose-Capillary 180 (H) 70 - 99 mg/dL    Comment: Glucose reference range applies only to samples taken after fasting for at least 8 hours.  CBC with Differential/Platelet     Status: Abnormal   Collection Time: 09/13/22  5:24 AM  Result Value Ref Range   WBC 6.4 4.0 - 10.5 K/uL   RBC 2.98 (L) 3.87 - 5.11 MIL/uL   Hemoglobin 8.1 (L) 12.0 - 15.0 g/dL   HCT 26.2 (L) 36.0 - 46.0 %   MCV 87.9  80.0 - 100.0 fL   MCH 27.2 26.0 - 34.0 pg   MCHC 30.9 30.0 - 36.0 g/dL   RDW 16.4 (H) 11.5 - 15.5 %   Platelets 216 150 - 400 K/uL   nRBC 0.0 0.0 - 0.2 %   Neutrophils Relative % 79 %   Neutro Abs 5.1 1.7 - 7.7 K/uL   Lymphocytes Relative 15 %   Lymphs Abs 0.9 0.7 - 4.0 K/uL   Monocytes Relative 5 %   Monocytes Absolute 0.3 0.1 - 1.0 K/uL   Eosinophils Relative 0 %   Eosinophils Absolute 0.0 0.0 - 0.5 K/uL   Basophils Relative 1 %   Basophils Absolute 0.0  0.0 - 0.1 K/uL   Immature Granulocytes 0 %   Abs Immature Granulocytes 0.02 0.00 - 0.07 K/uL    Comment: Performed at [facility] [address] [place], [place] [id number]  Basic metabolic panel     Status: Abnormal   Collection Time: 09/13/22  5:24 AM  Result Value Ref Range   Sodium 141 135 - 145 mmol/L   Potassium 5.3 (H) 3.5 - 5.1 mmol/L   Chloride 115 (H) 98 - 111 mmol/L   CO2 17 (L) 22 - 32 mmol/L   Glucose, Bld 162 (H) 70 - 99 mg/dL    Comment: Glucose reference range applies only to samples taken after fasting for at least 8 hours.   BUN 39 (H) 8 - 23 mg/dL   Creatinine, Ser 4.26 (H) 0.44 - 1.00 mg/dL   Calcium 9.4 8.9 - 10.3 mg/dL   GFR, Estimated 10 (L) >60 mL/min    Comment: (NOTE) Calculated using the CKD-EPI Creatinine Equation (2021)    Anion gap 9 5 - 15    Comment: Performed at [facility] [address] [place], [place] [address]  Acetaminophen level     Status: Abnormal   Collection Time: 09/13/22  5:24 AM  Result Value Ref Range   Acetaminophen (Tylenol), Serum <10 (L) 10 - 30 ug/mL    Comment: (NOTE) Therapeutic concentrations vary significantly. A range of 10-30 ug/mL  may be an effective concentration for many patients. However, some  are best treated at concentrations outside of this range. Acetaminophen concentrations >150 ug/mL at 4 hours after ingestion  and >50 ug/mL at 12 hours after ingestion are often associated with  toxic reactions.  Performed at [facility], [place] [address], [place], [place] [address]   CBG monitoring, ED     Status: Abnormal   Collection Time: 09/13/22  8:14 AM  Result Value Ref Range   Glucose-Capillary 128 (H) 70 - 99 mg/dL    Comment: Glucose reference range applies only to samples taken after fasting for at least 8 hours.  Glucose, capillary     Status: Abnormal   Collection Time: 09/13/22 11:08 AM  Result Value Ref Range   Glucose-Capillary 146 (H) 70 - 99 mg/dL    Comment: Glucose reference  range applies only to samples taken after fasting for at least 8 hours.     ROS: Negative unless otherwise stated.  Physical Exam: Vitals:   09/13/22 0954 09/13/22 1252  BP: (!) 168/112 (!) 163/94  Pulse: 82 95  Resp: 18   Temp: (!) 97.4 F (36.3 C)   SpO2: 100%    Sleeping, NAD. Wakes easily to voice. 1+ pitting edema to bilateral LE with tenderness.  Normal work of breathing on room air. Tenderness over bilateral ankles. Skin is warm  and dry, ashen over areas of feet. Wakes to voice, acknowledges her name but is not oriented to place or time.  Assessment/Plan: Acute metabolic encephalopathy HTN Consulted for concern for uremic component of encephalopathy.  AM labs reviewed: K 5.3 Chloride 115 CO2 17 BUN 39 Cr 4.26 GFR 10  Interestingly, chart review shows that current renal function is much better than it has been in lab draws over the last several years. Prior to this admission, most recent BUN and Cr were 62/4.65 with GFR 9 08/24/2022, and before that 03/19/2022 was 55/5.30 with GFR 9.   She has been hypertensive since initial presentation and current management is clonidine, diltiazem, metoprolol with noticeable improvement from admission but persistently elevated BPs. When looking at PCP visit notes, her BP is in normal range. There was concern per family that she had not been taking her medication prior to onset of symptoms.  I do not suspect acute encephalopathy is related to renal function. Recommend working to normalize BP and minimize centrally acting agents while remainder of work-up takes place. Patient's family (sister and niece) present at bedside confirm that she takes lasix 80 mg each morning and 40 mg each evening which has not yet been restarted. -Restart home lasix for further BP control and improvement of volume status tomorrow  CKD stage IV with metabolic acidosis and mild hyperkalemia Hx iron deficiency anemia  Renal function stable as above. K 5.3  on AM labs; patiromer ordered. -Continue sodium bicarbonate 650 mg TID -Continue calcitriol 0.25 mcg daily -Resume home ferrous sulfate 325 mg BID -Avoid nephrotoxic agents, renally dose medications -Strict I's/O's  Remainder of hospital problems per primary team: Type 2 diabetes mellitus CAD Gout  Farrel Gordon, DO Internal Medicine PGY-2 09/13/2022, 1:52 PM

## 2022-09-13 NOTE — Hospital Course (Addendum)
Patient is a 82 year old female with past medical history of CAD, CKD, HTN, diabetes mellitus type II presented to hospital with multiple episodes of falling and disorientation at home for 3 to 4 days.  Family stated that they were not sure if she was taking her medications.  In the ED patient was confused.  Initial labs were unremarkable except for creatinine elevated at 4.6 at baseline.  Blood pressure was elevated at 201/104.Marland Kitchen UA, CXR & CT head normal. WBC 6.7. FSBS 133.  Considered for admission to hospital for further evaluation and treatment.  Assessment and plan.  Acute metabolic encephalopathy Current etiology at this time could be polypharmacy, hypertensive encephalopathy uremia, withdrawal.  Initial labs were relatively stable except for elevated creatinine level.  Ammonia level within range.  IV nonreactive RPR under process.  TSH within normal range.  Vitamin B12 at 220.  Blood cultures were sent from the ED on the process.  COVID influenza negative.  Urine drug screen was negative.  Urinalysis was negative as well.  Chest x-ray without any infiltrate.  CT head was negative. -Patient on Roxicodone and Flexeril.  Held for now.  Unclear how much or how little Roxicodone patient has been taking, question withdrawal.  Urine drug screen did not show any opiates.  Tylenol level was less than 10.   Chronic kidney disease IV/metabolic acidosis/mild hyperkalemia Changes on Rocaltrol, bicarb, Veltassa at home.  BUN elevated at 39 with creatinine of 4.2.  Nephrology was consulted due to question of uremia.  Continue intake and output charting.   Diabetes mellitus type 2. Continue sliding scale insulin, Lantus.   CAD (coronary artery disease) Continue pravastatin, metoprolol, aspirin, Norvasc   Gout -Colchicine and Uloric resumed    Essential hypertension -Clonidine, Norvasc, metoprolol resumed.  Continue to monitor blood pressure.

## 2022-09-13 NOTE — Progress Notes (Signed)
PROGRESS NOTE    Emily Hughes  [id number] DOB: [birth year] DOA: 09/12/2022 PCP: Susy Frizzle, MD    Brief Narrative:  Patient is a 82 year old female with past medical history of CAD, CKD, HTN, diabetes mellitus type II presented to hospital with multiple episodes of falling and disorientation at home for 3 to 4 days.  Family stated that they were not sure if she was taking her medications.  In the ED patient was confused.  Initial labs were unremarkable except for creatinine elevated at 4.6 at baseline.  Blood pressure was elevated at 201/104.Marland Kitchen  Urinalysis, chest x-ray and CT head scan was normal. WBC 6.7. FSBS 133.  Patient was able considered for admission to hospital for further evaluation and treatment.  Assessment and plan.  Acute metabolic encephalopathy Current etiology at this time could be polypharmacy, hypertensive encephalopathy, uremia, withdrawal.  Initial labs were relatively stable except for elevated creatinine level.  Ammonia level within range.  IV nonreactive RPR was nonreactive.  TSH within normal range.  Vitamin B12 at 220.  Blood cultures were sent from the ED on process.  COVID influenza negative.  Urine drug screen was negative.  Urinalysis was negative as well.  Chest x-ray without any infiltrate.  CT head was negative. Patient on Roxicodone and Flexeril as outpatient as per the patient's sister she is mostly taking Tylenol..  .  Urine drug screen did not show any opiates.  Tylenol level was less than 10.  Will continue to closely monitor while in the hospital.  Will also add EEG.  Chronic kidney disease IV/metabolic acidosis/mild hyperkalemia Changes on Rocaltrol, bicarb, Veltassa at home.  BUN elevated at 39 with creatinine of 4.2.  Nephrology was consulted due to question of uremia.  Continue intake and output charting.  Check BMP in AM.  Will receive Veltassa.   Diabetes mellitus type 2. Continue sliding scale insulin, Lantus.  Latest POC glucose of 146.    CAD (coronary artery disease) Patient is on pravastatin, metoprolol, aspirin   Gout -Colchicine and Uloric resumed    Essential hypertension Continue clonidine metoprolol and Cardizem. Closely monitor blood pressure.    DVT prophylaxis: heparin injection 5,000 Units Start: 09/13/22 0600   Code Status:     Code Status: Full Code  Disposition: Home likely in 1 to 2 days.  Status is: Inpatient  Remains inpatient appropriate because: Altered mental status, further monitoring and workup.  Consultants:  Nephrology  Procedures:  None  Antimicrobials:  None.  Anti-infectives (From admission, onward)    None      Subjective: Today, patient was seen and examined at bedside.  Patient's sister at bedside.  Patient appears to be confused and disoriented and seems to be hallucinating.  Objective: Vitals:   09/13/22 0715 09/13/22 0730 09/13/22 0745 09/13/22 0954  BP: (!) 165/85 (!) 169/89 (!) 172/92 (!) 168/112  Pulse: 79 82 82 82  Resp: (!) 21 (!) '21 19 18  '$ Temp:    (!) 97.4 F (36.3 C)  TempSrc:    Oral  SpO2: 100% 100% 100% 100%  Weight:    88.7 kg  Height:    '5\' 3"'$  (1.6 m)    Intake/Output Summary (Last 24 hours) at 09/13/2022 1119 Last data filed at 09/13/2022 0450 Gross per 24 hour  Intake 2153.03 ml  Output --  Net 2153.03 ml   Filed Weights   09/12/22 1541 09/13/22 0954  Weight: 85.6 kg 88.7 kg    Physical Examination: Body mass index is 34.64 kg/m.  General: Obese built, not in obvious distress HENT:   No scleral pallor or icterus noted. Oral mucosa is moist.  Chest:  Clear breath sounds.  Diminished breath sounds bilaterally. No crackles or wheezes.  CVS: S1 &S2 heard. No murmur.  Regular rate and rhythm. Abdomen: Soft, nontender, nondistended.  Bowel sounds are heard.   Extremities: No cyanosis, clubbing or edema.  Peripheral pulses are palpable.  Tenderness of bilateral knees but no effusion or redness. Psych: Alert, awake but confused and  disoriented, appears to have some hallucinations at times. CNS:  No cranial nerve deficits.  Moves extremities. Skin: Warm and dry.  No rashes noted.  Data Reviewed:   CBC: Recent Labs  Lab 09/12/22 1610 09/13/22 0024 09/13/22 0524  WBC 6.4 6.7 6.4  NEUTROABS 4.7  --  5.1  HGB 8.9* 9.2* 8.1*  HCT 29.8* 29.0* 26.2*  MCV 87.4 85.8 87.9  PLT 230 224 [id number]    Basic Metabolic Panel: Recent Labs  Lab 09/12/22 1610 09/13/22 0024 09/13/22 0524  NA 139  --  141  K 5.3*  --  5.3*  CL 111  --  115*  CO2 21*  --  17*  GLUCOSE 196*  --  162*  BUN 43*  --  39*  CREATININE 4.67* 4.44* 4.26*  CALCIUM 10.0  --  9.4    Liver Function Tests: No results for input(s): "AST", "ALT", "ALKPHOS", "BILITOT", "PROT", "ALBUMIN" in the last 168 hours.   Radiology Studies: CT HEAD WO CONTRAST (5MM)  Result Date: 09/12/2022 CLINICAL DATA:  Mental status change. EXAM: CT HEAD WITHOUT CONTRAST TECHNIQUE: Contiguous axial images were obtained from the base of the skull through the vertex without intravenous contrast. RADIATION DOSE REDUCTION: This exam was performed according to the departmental dose-optimization program which includes automated exposure control, adjustment of the mA and/or kV according to patient size and/or use of iterative reconstruction technique. COMPARISON:  Brain CT 03/19/2022 FINDINGS: Brain: Ventricles and sulci are prominent compatible with atrophy. Periventricular and subcortical white matter hypodensities compatible with chronic microvascular ischemic changes. Bilateral basal ganglia calcifications. No evidence for acute cortically based infarct, intracranial hemorrhage, mass lesion or mass effect. Vascular: No hyperdense vessel or unexpected calcification. Skull: Normal. Negative for fracture or focal lesion. Sinuses/Orbits: Paranasal sinuses well aerated. Mastoid air cells unremarkable. Orbits are unremarkable. Other: None IMPRESSION: 1. No acute intracranial process. 2. Atrophy  and chronic microvascular ischemic changes. Electronically Signed   By: Lovey Newcomer M.D.   On: 09/12/2022 16:33   DG Chest Portable 1 View  Result Date: 09/12/2022 CLINICAL DATA:  Altered mental status. EXAM: PORTABLE CHEST 1 VIEW COMPARISON:  07/21/2021 FINDINGS: The heart size and mediastinal contours are within normal limits. Both lungs are clear. The visualized skeletal structures are unremarkable. IMPRESSION: No active disease. Electronically Signed   By: Nolon Nations M.D.   On: 09/12/2022 16:19      LOS: 1 day     Flora Lipps, MD [facility] Available via Epic secure chat 7am-7pm After these hours, please refer to coverage provider listed on [email or web address] 09/13/2022, 11:19 AM

## 2022-09-13 NOTE — ED Notes (Signed)
Please call and update family [phone]

## 2022-09-13 NOTE — Progress Notes (Signed)
EEG complete - results pending 

## 2022-09-13 NOTE — Progress Notes (Signed)
No UOP noted, bladder scan 117 ml, MD made aware  Anastasio Auerbach

## 2022-09-13 NOTE — ED Notes (Signed)
ED TO INPATIENT HANDOFF REPORT  ED Nurse Name and Phone #: Josh  S Name/Age/Gender Emily Hughes 82 y.o. female Room/Bed: 006C/006C  Code Status   Code Status: Full Code  Home/SNF/Other unclear disoriented at this time Is this baseline? No   Triage Complete: Triage complete  Chief Complaint Acute metabolic encephalopathy [id number]  Triage Note BIBA after family founf pt still in bed tthis afternoon, has been confused for 2-3 days, and weaker than normal.  Lives alone, is usually Aox4 and ambulates with walker.   Allergies Allergies  Allergen Reactions   5-Alpha Reductase Inhibitors    Ace Inhibitors    Actos [Pioglitazone Hydrochloride]    Angiotensin Receptor Blockers Hives   Dust Mite Extract    Dye Fdc Red [Fd&C Red #3 (Erythrosine Sodium)]    Fish Allergy    Iodinated Contrast Media Hives   Pioglitazone     Other reaction(s): swelling    Level of Care/Admitting Diagnosis ED Disposition     ED Disposition  Admit   Condition  --   Comment  Hospital Area: [facility] [[id number]]  Level of Care: Telemetry Medical [[id number]]  May admit patient to [facility] or [facility] if equivalent level of care is available:: Yes  Covid Evaluation: Confirmed COVID Negative  Diagnosis: Acute metabolic encephalopathy [id number]  Admitting Physician: Quintella Baton [[id number]]  Attending Physician: Quintella Baton [id number]  Certification:: I certify this patient will need inpatient services for at least 2 midnights  Estimated Length of Stay: 2          B Medical/Surgery History Past Medical History:  Diagnosis Date   Anemia    CAD (coronary artery disease)    Colon polyps    CRI (chronic renal insufficiency)    DDD (degenerative disc disease)    Diabetes mellitus    INSULIN DEPENDENT   GERD (gastroesophageal reflux disease)    High cholesterol    Hypertension    Pancreatitis    Proteinuria    Past Surgical History:  Procedure Laterality Date    ABDOMINAL HYSTERECTOMY     ANGIOPLASTY     ANKLE SURGERY Right    BACK SURGERY     INCISION AND DRAINAGE ABSCESS N/A 11/09/2018   Procedure: INCISION AND DRAINAGE ABSCESS;  Surgeon: Coralie Keens, MD;  Location: [facility];  Service: General;  Laterality: N/A;   TONSILLECTOMY       A IV Location/Drains/Wounds Patient Lines/Drains/Airways Status     Active Line/Drains/Airways     Name Placement date Placement time Site Days   Peripheral IV 09/12/22 20 G Anterior;Left;Proximal Forearm 09/12/22  --  Forearm  1   Incision (Closed) 11/09/18 Buttocks Left 11/09/18  1408  -- 1404   Wound / Incision (Open or Dehisced) 11/13/18 Incision - Open Buttocks Left L buttocks wound s/p I&D by Surgery 11/09/18. **HYDRO/PT** 11/13/18  1500  Buttocks  1400            Intake/Output Last 24 hours  Intake/Output Summary (Last 24 hours) at 09/13/2022 0908 Last data filed at 09/13/2022 0450 Gross per 24 hour  Intake 2153.03 ml  Output --  Net 2153.03 ml    Labs/Imaging Results for orders placed or performed during the hospital encounter of 09/12/22 (from the past 48 hour(s))  Urinalysis, Routine w reflex microscopic -Urine, Clean Catch     Status: Abnormal   Collection Time: 09/12/22  3:52 PM  Result Value Ref Range   Color, Urine YELLOW YELLOW   APPearance CLEAR CLEAR  Specific Gravity, Urine 1.014 1.005 - 1.030   pH 6.0 5.0 - 8.0   Glucose, UA 150 (A) NEGATIVE mg/dL   Hgb urine dipstick SMALL (A) NEGATIVE   Bilirubin Urine NEGATIVE NEGATIVE   Ketones, ur NEGATIVE NEGATIVE mg/dL   Protein, ur >=300 (A) NEGATIVE mg/dL   Nitrite NEGATIVE NEGATIVE   Leukocytes,Ua NEGATIVE NEGATIVE   RBC / HPF 0-5 0 - 5 RBC/hpf   WBC, UA 0-5 0 - 5 WBC/hpf   Bacteria, UA RARE (A) NONE SEEN   Squamous Epithelial / HPF 0-5 0 - 5 /HPF   Mucus PRESENT     Comment: Performed at [facility], [place] [address] [place], [place] [address]  Rapid urine drug screen (hospital performed)     Status: None    Collection Time: 09/12/22  3:52 PM  Result Value Ref Range   Opiates NONE DETECTED NONE DETECTED   Cocaine NONE DETECTED NONE DETECTED   Benzodiazepines NONE DETECTED NONE DETECTED   Amphetamines NONE DETECTED NONE DETECTED   Tetrahydrocannabinol NONE DETECTED NONE DETECTED   Barbiturates NONE DETECTED NONE DETECTED    Comment: (NOTE) DRUG SCREEN FOR MEDICAL PURPOSES ONLY.  IF CONFIRMATION IS NEEDED FOR ANY PURPOSE, NOTIFY LAB WITHIN 5 DAYS.  LOWEST DETECTABLE LIMITS FOR URINE DRUG SCREEN Drug Class                     Cutoff (ng/mL) Amphetamine and metabolites    1000 Barbiturate and metabolites    200 Benzodiazepine                 200 Opiates and metabolites        300 Cocaine and metabolites        300 THC                            50 Performed at [facility], [place] [address] [place], [place] [address]   CBC with Differential     Status: Abnormal   Collection Time: 09/12/22  4:10 PM  Result Value Ref Range   WBC 6.4 4.0 - 10.5 K/uL   RBC 3.41 (L) 3.87 - 5.11 MIL/uL   Hemoglobin 8.9 (L) 12.0 - 15.0 g/dL   HCT 29.8 (L) 36.0 - 46.0 %   MCV 87.4 80.0 - 100.0 fL   MCH 26.1 26.0 - 34.0 pg   MCHC 29.9 (L) 30.0 - 36.0 g/dL   RDW 16.4 (H) 11.5 - 15.5 %   Platelets 230 150 - 400 K/uL   nRBC 0.0 0.0 - 0.2 %   Neutrophils Relative % 74 %   Neutro Abs 4.7 1.7 - 7.7 K/uL   Lymphocytes Relative 16 %   Lymphs Abs 1.1 0.7 - 4.0 K/uL   Monocytes Relative 8 %   Monocytes Absolute 0.5 0.1 - 1.0 K/uL   Eosinophils Relative 1 %   Eosinophils Absolute 0.1 0.0 - 0.5 K/uL   Basophils Relative 1 %   Basophils Absolute 0.0 0.0 - 0.1 K/uL   Immature Granulocytes 0 %   Abs Immature Granulocytes 0.02 0.00 - 0.07 K/uL    Comment: Performed at [facility], [place] [address] [place], [place] [id number]  Basic metabolic panel     Status: Abnormal   Collection Time: 09/12/22  4:10 PM  Result Value Ref Range   Sodium 139 135 - 145 mmol/L   Potassium 5.3 (H) 3.5 - 5.1 mmol/L  Chloride 111 98 - 111 mmol/L   CO2 21 (L) 22 - 32 mmol/L   Glucose, Bld 196 (H) 70 - 99 mg/dL    Comment: Glucose reference range applies only to samples taken after fasting for at least 8 hours.   BUN 43 (H) 8 - 23 mg/dL   Creatinine, Ser 4.67 (H) 0.44 - 1.00 mg/dL   Calcium 10.0 8.9 - 10.3 mg/dL   GFR, Estimated 9 (L) >60 mL/min    Comment: (NOTE) Calculated using the CKD-EPI Creatinine Equation (2021)    Anion gap 7 5 - 15    Comment: Performed at [facility] [address] [place], [place] [address]  Resp panel by RT-PCR (RSV, Flu A&B, Covid) Anterior Nasal Swab     Status: None   Collection Time: 09/12/22  4:15 PM   Specimen: Anterior Nasal Swab  Result Value Ref Range   SARS Coronavirus 2 by RT PCR NEGATIVE NEGATIVE   Influenza A by PCR NEGATIVE NEGATIVE   Influenza B by PCR NEGATIVE NEGATIVE    Comment: (NOTE) The Xpert Xpress SARS-CoV-2/FLU/RSV plus assay is intended as an aid in the diagnosis of influenza from Nasopharyngeal swab specimens and should not be used as a sole basis for treatment. Nasal washings and aspirates are unacceptable for Xpert Xpress SARS-CoV-2/FLU/RSV testing.  Fact Sheet for Patients: EntrepreneurPulse.com.au  Fact Sheet for Healthcare Providers: IncredibleEmployment.be  This test is not yet approved or cleared by the [place] FDA and has been authorized for detection and/or diagnosis of SARS-CoV-2 by FDA under an Emergency Use Authorization (EUA). This EUA will remain in effect (meaning this test can be used) for the duration of the COVID-19 declaration under Section 564(b)(1) of the Act, 21 U.S.C. section 360bbb-3(b)(1), unless the authorization is terminated or revoked.     Resp Syncytial Virus by PCR NEGATIVE NEGATIVE    Comment: (NOTE) Fact Sheet for Patients: EntrepreneurPulse.com.au  Fact Sheet for Healthcare  Providers: IncredibleEmployment.be  This test is not yet approved or cleared by the [place] FDA and has been authorized for detection and/or diagnosis of SARS-CoV-2 by FDA under an Emergency Use Authorization (EUA). This EUA will remain in effect (meaning this test can be used) for the duration of the COVID-19 declaration under Section 564(b)(1) of the Act, 21 U.S.C. section 360bbb-3(b)(1), unless the authorization is terminated or revoked.  Performed at [facility], [place] [address] [place], [place] [address]   CBG monitoring, ED     Status: Abnormal   Collection Time: 09/12/22 10:33 PM  Result Value Ref Range   Glucose-Capillary 133 (H) 70 - 99 mg/dL    Comment: Glucose reference range applies only to samples taken after fasting for at least 8 hours.   Comment 1 Notify RN   Vitamin B12     Status: None   Collection Time: 09/13/22 12:24 AM  Result Value Ref Range   Vitamin B-12 220 180 - 914 pg/mL    Comment: (NOTE) This assay is not validated for testing neonatal or myeloproliferative syndrome specimens for Vitamin B12 levels. Performed at [facility], [place] [address], [place], [place] [address]   TSH     Status: None   Collection Time: 09/13/22 12:24 AM  Result Value Ref Range   TSH 2.991 0.350 - 4.500 uIU/mL    Comment: Performed by a 3rd Generation assay with a functional sensitivity of <=0.01 uIU/mL. Performed at [facility], [place] [address] [place], [place] [address]  HIV Antibody (routine testing w rflx)     Status: None   Collection Time: 09/13/22 12:24 AM  Result Value Ref Range   HIV Screen 4th Generation wRfx Non Reactive Non Reactive    Comment: Performed at [facility], [place] [address], [place], [place] [address]  Brain natriuretic peptide     Status: Abnormal   Collection Time: 09/13/22 12:24 AM  Result Value Ref Range   B Natriuretic Peptide 308.6 (H) 0.0 - 100.0 pg/mL    Comment: Performed at [facility] [address] [place], [place] [address]  CBC     Status: Abnormal   Collection Time: 09/13/22 12:24 AM  Result Value Ref Range   WBC 6.7 4.0 - 10.5 K/uL   RBC 3.38 (L) 3.87 - 5.11 MIL/uL   Hemoglobin 9.2 (L) 12.0 - 15.0 g/dL   HCT 29.0 (L) 36.0 - 46.0 %   MCV 85.8 80.0 - 100.0 fL   MCH 27.2 26.0 - 34.0 pg   MCHC 31.7 30.0 - 36.0 g/dL   RDW 16.3 (H) 11.5 - 15.5 %   Platelets 224 150 - 400 K/uL   nRBC 0.0 0.0 - 0.2 %    Comment: Performed at [facility], [place] [address], [place], [place] [address]  CK     Status: None   Collection Time: 09/13/22 12:24 AM  Result Value Ref Range   Total CK 175 38 - 234 U/L    Comment: Performed at [facility], [place] [address], [place], [place] [address]  Creatinine, serum     Status: Abnormal   Collection Time: 09/13/22 12:24 AM  Result Value Ref Range   Creatinine, Ser 4.44 (H) 0.44 - 1.00 mg/dL   GFR, Estimated 10 (L) >60 mL/min    Comment: (NOTE) Calculated using the CKD-EPI Creatinine Equation (2021) Performed at [facility] [address], [place], [place] [address]   Ammonia     Status: None   Collection Time: 09/13/22 12:29 AM  Result Value Ref Range   Ammonia 24 9 - 35 umol/L    Comment: HEMOLYSIS AT THIS LEVEL MAY AFFECT RESULT Performed at [facility], [place] [address] [place],  [address]   CBG monitoring, ED     Status: Abnormal   Collection Time: 09/13/22 12:56 AM  Result Value Ref Range   Glucose-Capillary 180 (H) 70 - 99 mg/dL    Comment: Glucose reference range applies only to samples taken after fasting for at least 8 hours.  CBC with Differential/Platelet     Status: Abnormal   Collection Time: 09/13/22  5:24 AM  Result Value Ref Range   WBC 6.4 4.0 - 10.5 K/uL   RBC 2.98 (L) 3.87 - 5.11 MIL/uL   Hemoglobin 8.1 (L) 12.0 - 15.0 g/dL   HCT 26.2 (L) 36.0 - 46.0 %   MCV 87.9 80.0 - 100.0 fL   MCH 27.2 26.0 - 34.0 pg   MCHC 30.9 30.0 - 36.0 g/dL   RDW 16.4 (H) 11.5 - 15.5 %    Platelets 216 150 - 400 K/uL   nRBC 0.0 0.0 - 0.2 %   Neutrophils Relative % 79 %   Neutro Abs 5.1 1.7 - 7.7 K/uL   Lymphocytes Relative 15 %   Lymphs Abs 0.9 0.7 - 4.0 K/uL   Monocytes Relative 5 %   Monocytes Absolute 0.3 0.1 - 1.0 K/uL   Eosinophils Relative 0 %  Eosinophils Absolute 0.0 0.0 - 0.5 K/uL   Basophils Relative 1 %   Basophils Absolute 0.0 0.0 - 0.1 K/uL   Immature Granulocytes 0 %   Abs Immature Granulocytes 0.02 0.00 - 0.07 K/uL    Comment: Performed at [facility], [place] [address], [place], [place] [id number]  Basic metabolic panel     Status: Abnormal   Collection Time: 09/13/22  5:24 AM  Result Value Ref Range   Sodium 141 135 - 145 mmol/L   Potassium 5.3 (H) 3.5 - 5.1 mmol/L   Chloride 115 (H) 98 - 111 mmol/L   CO2 17 (L) 22 - 32 mmol/L   Glucose, Bld 162 (H) 70 - 99 mg/dL    Comment: Glucose reference range applies only to samples taken after fasting for at least 8 hours.   BUN 39 (H) 8 - 23 mg/dL   Creatinine, Ser 4.26 (H) 0.44 - 1.00 mg/dL   Calcium 9.4 8.9 - 10.3 mg/dL   GFR, Estimated 10 (L) >60 mL/min    Comment: (NOTE) Calculated using the CKD-EPI Creatinine Equation (2021)    Anion gap 9 5 - 15    Comment: Performed at [facility] [address], [place], [place] [address]  Acetaminophen level     Status: Abnormal   Collection Time: 09/13/22  5:24 AM  Result Value Ref Range   Acetaminophen (Tylenol), Serum <10 (L) 10 - 30 ug/mL    Comment: (NOTE) Therapeutic concentrations vary significantly. A range of 10-30 ug/mL  may be an effective concentration for many patients. However, some  are best treated at concentrations outside of this range. Acetaminophen concentrations >150 ug/mL at 4 hours after ingestion  and >50 ug/mL at 12 hours after ingestion are often associated with  toxic reactions.  Performed at [facility], [place] [address], [place],  [address]   CBG monitoring, ED     Status: Abnormal   Collection Time:  09/13/22  8:14 AM  Result Value Ref Range   Glucose-Capillary 128 (H) 70 - 99 mg/dL    Comment: Glucose reference range applies only to samples taken after fasting for at least 8 hours.   CT HEAD WO CONTRAST (5MM)  Result Date: 09/12/2022 CLINICAL DATA:  Mental status change. EXAM: CT HEAD WITHOUT CONTRAST TECHNIQUE: Contiguous axial images were obtained from the base of the skull through the vertex without intravenous contrast. RADIATION DOSE REDUCTION: This exam was performed according to the departmental dose-optimization program which includes automated exposure control, adjustment of the mA and/or kV according to patient size and/or use of iterative reconstruction technique. COMPARISON:  Brain CT 03/19/2022 FINDINGS: Brain: Ventricles and sulci are prominent compatible with atrophy. Periventricular and subcortical white matter hypodensities compatible with chronic microvascular ischemic changes. Bilateral basal ganglia calcifications. No evidence for acute cortically based infarct, intracranial hemorrhage, mass lesion or mass effect. Vascular: No hyperdense vessel or unexpected calcification. Skull: Normal. Negative for fracture or focal lesion. Sinuses/Orbits: Paranasal sinuses well aerated. Mastoid air cells unremarkable. Orbits are unremarkable. Other: None IMPRESSION: 1. No acute intracranial process. 2. Atrophy and chronic microvascular ischemic changes. Electronically Signed   By: Lovey Newcomer M.D.   On: 09/12/2022 16:33   DG Chest Portable 1 View  Result Date: 09/12/2022 CLINICAL DATA:  Altered mental status. EXAM: PORTABLE CHEST 1 VIEW COMPARISON:  07/21/2021 FINDINGS: The heart size and mediastinal contours are within normal limits. Both lungs are clear. The visualized skeletal structures are unremarkable. IMPRESSION: No active disease.  Electronically Signed   By: Nolon Nations M.D.   On: 09/12/2022 16:19    Pending Labs Unresulted Labs (From admission, onward)     Start     Ordered    09/12/22 2253  Culture, blood (Routine X 2) w Reflex to ID Panel  BLOOD CULTURE X 2,   R      09/12/22 2252   09/12/22 2206  RPR  Once,   URGENT        09/12/22 2206            Vitals/Pain Today's Vitals   09/13/22 0700 09/13/22 0715 09/13/22 0730 09/13/22 0745  BP: (!) 157/91 (!) 165/85 (!) 169/89 (!) 172/92  Pulse: 77 79 82 82  Resp: 13 (!) 21 (!) 21 19  Temp:      TempSrc:      SpO2: 100% 100% 100% 100%  Weight:      Height:      PainSc:        Isolation Precautions No active isolations  Medications Medications  metoprolol tartrate (LOPRESSOR) injection 5 mg (5 mg Intravenous Given 09/13/22 0142)  pantoprazole (PROTONIX) injection 40 mg (has no administration in time range)  insulin aspart (novoLOG) injection 0-15 Units ( Subcutaneous Not Given 09/13/22 0802)  heparin injection 5,000 Units (5,000 Units Subcutaneous Given 09/13/22 0517)  acetaminophen (TYLENOL) tablet 650 mg (has no administration in time range)    Or  acetaminophen (TYLENOL) suppository 650 mg (has no administration in time range)  ondansetron (ZOFRAN) tablet 4 mg (has no administration in time range)    Or  ondansetron (ZOFRAN) injection 4 mg (has no administration in time range)  patiromer Daryll Drown) packet 8.4 g (has no administration in time range)  sucralfate (CARAFATE) tablet 1 g (has no administration in time range)  sodium bicarbonate tablet 650 mg (0 mg Oral Hold 09/13/22 0851)  levothyroxine (SYNTHROID) tablet 50 mcg (0 mcg Oral Hold 09/13/22 0522)  diltiazem (CARDIZEM CD) 24 hr capsule 300 mg (has no administration in time range)  colchicine tablet 0.6 mg (has no administration in time range)  calcitRIOL (ROCALTROL) capsule 0.25 mcg (has no administration in time range)  cloNIDine (CATAPRES) tablet 0.1 mg (has no administration in time range)  insulin glargine-yfgn (SEMGLEE) injection 6 Units (has no administration in time range)  lactated ringers bolus 1,000 mL (0 mLs Intravenous Stopped  09/12/22 2250)  lactated ringers bolus 1,000 mL (0 mLs Intravenous Stopped 09/13/22 0450)  hydrALAZINE (APRESOLINE) injection 5 mg (5 mg Intravenous Given 09/13/22 0522)    Mobility non-ambulatory     Focused Assessments     R Recommendations: See Admitting Provider Note  Report given to:   Additional Notes:

## 2022-09-13 NOTE — Procedures (Signed)
Patient Name: Emily Hughes  MRN: [id number]  Epilepsy Attending: Lora Havens  Referring Physician/Provider: Flora Lipps, MD  Date: 09/13/2022 Duration: 22.06 mins  Patient history: 82yo F with ams. EEG to evaluate for seizure  Level of alertness: Awake, asleep  AEDs during EEG study: None  Technical aspects: This EEG study was done with scalp electrodes positioned according to the 10-20 International system of electrode placement. Electrical activity was reviewed with band pass filter of 1-'70Hz'$ , sensitivity of 7 uV/mm, display speed of 4m/sec with a '60Hz'$  notched filter applied as appropriate. EEG data were recorded continuously and digitally stored.  Video monitoring was available and reviewed as appropriate.  Description: The posterior dominant rhythm consists of 8 Hz activity of moderate voltage (25-35 uV) seen predominantly in posterior head regions, symmetric and reactive to eye opening and eye closing. Sleep was characterized by vertex waves, sleep spindles (12 to 14 Hz), maximal frontocentral region. EEG showed intermittent generalized 3 to 6 Hz theta-delta slowing. Hyperventilation and photic stimulation were not performed.     ABNORMALITY - Intermittent slow, generalized  IMPRESSION: This study is suggestive of mild diffuse encephalopathy, nonspecific etiology. No seizures or epileptiform discharges were seen throughout the recording.  Emily Hughes [name]

## 2022-09-14 LAB — BASIC METABOLIC PANEL
Anion gap: 9 (ref 5–15)
BUN: 39 mg/dL — ABNORMAL HIGH (ref 8–23)
CO2: 17 mmol/L — ABNORMAL LOW (ref 22–32)
Calcium: 9 mg/dL (ref 8.9–10.3)
Chloride: 115 mmol/L — ABNORMAL HIGH (ref 98–111)
Creatinine, Ser: 4.3 mg/dL — ABNORMAL HIGH (ref 0.44–1.00)
GFR, Estimated: 10 mL/min — ABNORMAL LOW (ref >60)
Glucose, Bld: 111 mg/dL — ABNORMAL HIGH (ref 70–99)
Potassium: 4.9 mmol/L (ref 3.5–5.1)
Sodium: 141 mmol/L (ref 135–145)

## 2022-09-14 LAB — CBC
HCT: 28.2 % — ABNORMAL LOW (ref 36.0–46.0)
Hemoglobin: 8.7 g/dL — ABNORMAL LOW (ref 12.0–15.0)
MCH: 26.4 pg (ref 26.0–34.0)
MCHC: 30.9 g/dL (ref 30.0–36.0)
MCV: 85.7 fL (ref 80.0–100.0)
Platelets: UNDETERMINED 10*3/uL (ref 150–400)
RBC: 3.29 MIL/uL — ABNORMAL LOW (ref 3.87–5.11)
RDW: 16.2 % — ABNORMAL HIGH (ref 11.5–15.5)
WBC: 6 10*3/uL (ref 4.0–10.5)
nRBC: 0 % (ref 0.0–0.2)

## 2022-09-14 LAB — GLUCOSE, CAPILLARY
Glucose-Capillary: 115 mg/dL — ABNORMAL HIGH (ref 70–99)
Glucose-Capillary: 117 mg/dL — ABNORMAL HIGH (ref 70–99)
Glucose-Capillary: 137 mg/dL — ABNORMAL HIGH (ref 70–99)
Glucose-Capillary: 148 mg/dL — ABNORMAL HIGH (ref 70–99)
Glucose-Capillary: 171 mg/dL — ABNORMAL HIGH (ref 70–99)
Glucose-Capillary: 207 mg/dL — ABNORMAL HIGH (ref 70–99)

## 2022-09-14 MED ORDER — DARBEPOETIN ALFA 200 MCG/0.4ML IJ SOSY
200.0000 ug | PREFILLED_SYRINGE | INTRAMUSCULAR | Status: DC
  Administered 2022-09-14: 200 ug via SUBCUTANEOUS
  Filled 2022-09-14: qty 0.4

## 2022-09-14 NOTE — Evaluation (Signed)
Occupational Therapy Evaluation Patient Details Name: Emily Hughes MRN: [id number] DOB: 07-15-1940 Today's Date: 09/14/2022   History of Present Illness 82 yo female presents to [facility] on 3/10 with AMS, falls. Workup for AME. PMH includes DM, CAD, CKD, HTN, DDD.   Clinical Impression   Pt reports independence at baseline with ADLs, uses rollator for mobility, lives alone and reports sister calls her daily/assists as needed. Pt currently needing set up -max A for ADLs, min-mod +2 for bed mobility,and mod +2 for sit to stand transfer with RW. Pt with impaired cognition, needing mod cues to perform basic task and often perseverates on things therapist says throughout session. Pt presenting with impairments listed below, will follow acutely. Recommend SNF at d/c.      Recommendations for follow up therapy are one component of a multi-disciplinary discharge planning process, led by the attending physician.  Recommendations may be updated based on patient status, additional functional criteria and insurance authorization.   Follow Up Recommendations  Skilled nursing-short term rehab (<3 hours/day)     Assistance Recommended at Discharge Frequent or constant Supervision/Assistance  Patient can return home with the following Two people to help with walking and/or transfers;A lot of help with bathing/dressing/bathroom;Assistance with cooking/housework;Direct supervision/assist for financial management;Direct supervision/assist for medications management;Assist for transportation;Help with stairs or ramp for entrance    Functional Status Assessment  Patient has had a recent decline in their functional status and demonstrates the ability to make significant improvements in function in a reasonable and predictable amount of time.  Equipment Recommendations  Other (comment) (defer)    Recommendations for Other Services PT consult     Precautions / Restrictions Precautions Precautions:  Fall Restrictions Weight Bearing Restrictions: No      Mobility Bed Mobility Overal bed mobility: Needs Assistance Bed Mobility: Sit to Supine, Supine to Sit     Supine to sit: Min assist, +2 for physical assistance Sit to supine: Mod assist, +2 for physical assistance        Transfers Overall transfer level: Needs assistance Equipment used: Rollator (4 wheels) Transfers: Sit to/from Stand Sit to Stand: Mod assist, +2 physical assistance           General transfer comment: mod +2, cues to push from bed and for hand placement on RW, pt keeping trunk flexed      Balance Overall balance assessment: Needs assistance Sitting-balance support: Feet supported Sitting balance-Leahy Scale: Good Sitting balance - Comments: sits unsupported EOB   Standing balance support: Reliant on assistive device for balance, During functional activity Standing balance-Leahy Scale: Poor Standing balance comment: reliant on external support                           ADL either performed or assessed with clinical judgement   ADL Overall ADL's : Needs assistance/impaired Eating/Feeding: Set up;Sitting   Grooming: Set up;Sitting   Upper Body Bathing: Moderate assistance;Sitting   Lower Body Bathing: Moderate assistance;Sitting/lateral leans   Upper Body Dressing : Moderate assistance;Sitting   Lower Body Dressing: Moderate assistance;Sitting/lateral leans Lower Body Dressing Details (indicate cue type and reason): assist to don R sock seated EOB Toilet Transfer: Moderate assistance;+2 for physical assistance;Stand-pivot;Squat-pivot;BSC/3in1   Toileting- Clothing Manipulation and Hygiene: Maximal assistance       Functional mobility during ADLs: Moderate assistance;+2 for physical assistance;Rollator (4 wheels);Cueing for sequencing;Cueing for safety       Vision   Vision Assessment?: No apparent visual deficits  Perception Perception Perception Tested?: No    Praxis Praxis Praxis tested?: Not tested    Pertinent Vitals/Pain Pain Assessment Pain Assessment: No/denies pain     Hand Dominance Right   Extremity/Trunk Assessment Upper Extremity Assessment Upper Extremity Assessment: Defer to OT evaluation   Lower Extremity Assessment Lower Extremity Assessment: Generalized weakness   Cervical / Trunk Assessment Cervical / Trunk Assessment: Normal   Communication Communication Communication: No difficulties   Cognition Arousal/Alertness: Awake/alert Behavior During Therapy: WFL for tasks assessed/performed, Flat affect Overall Cognitive Status: Impaired/Different from baseline Area of Impairment: Orientation, Memory, Attention, Following commands, Safety/judgement, Problem solving, Awareness                 Orientation Level: Time, Situation Current Attention Level: Focused Memory: Decreased short-term memory Following Commands: Follows one step commands inconsistently Safety/Judgement: Decreased awareness of safety, Decreased awareness of deficits Awareness: Intellectual Problem Solving: Slow processing, Decreased initiation, Difficulty sequencing, Requires verbal cues, Requires tactile cues General Comments: mod cues to perform basic single step tasks, pt oriented to self and place, perseverative on "[facility]" when asked other questions.     General Comments  VSS on RA    Exercises     Shoulder Instructions      Home Living Family/patient expects to be discharged to:: Private residence Living Arrangements: Alone Available Help at Discharge: Family;Available PRN/intermittently Type of Home: House Home Access: Level entry     Home Layout: One level     Bathroom Shower/Tub: Teacher, early years/pre: Standard     Home Equipment: Rollator (4 wheels)          Prior Functioning/Environment Prior Level of Function : Independent/Modified Independent;Driving             Mobility  Comments: ambulates with RW ADLs Comments: reports ind with ADLs/IADLs/drives        OT Problem List: Decreased strength;Decreased range of motion;Decreased activity tolerance;Impaired balance (sitting and/or standing);Decreased cognition;Decreased safety awareness      OT Treatment/Interventions: Self-care/ADL training;Therapeutic exercise;Energy conservation;DME and/or AE instruction;Therapeutic activities;Patient/family education;Balance training;Cognitive remediation/compensation    OT Goals(Current goals can be found in the care plan section) Acute Rehab OT Goals Patient Stated Goal: none stated OT Goal Formulation: With patient Time For Goal Achievement: 09/28/22 Potential to Achieve Goals: Good ADL Goals Pt Will Perform Upper Body Dressing: with supervision;sitting Pt Will Perform Lower Body Dressing: with supervision;sitting/lateral leans;sit to/from stand Pt Will Transfer to Toilet: with supervision;ambulating;regular height toilet Pt Will Perform Tub/Shower Transfer: Tub transfer;Shower transfer;with supervision;ambulating;shower seat;rolling walker Additional ADL Goal #1: pt will follow 3 step task in prep for ADLs  OT Frequency: Min 2X/week    Co-evaluation PT/OT/SLP Co-Evaluation/Treatment: Yes Reason for Co-Treatment: Complexity of the patient's impairments (multi-system involvement);For patient/therapist safety PT goals addressed during session: Mobility/safety with mobility;Balance OT goals addressed during session: ADL's and self-care;Strengthening/ROM      AM-PAC OT "6 Clicks" Daily Activity     Outcome Measure Help from another person eating meals?: A Little Help from another person taking care of personal grooming?: A Little Help from another person toileting, which includes using toliet, bedpan, or urinal?: A Lot Help from another person bathing (including washing, rinsing, drying)?: A Lot Help from another person to put on and taking off regular upper body  clothing?: A Lot Help from another person to put on and taking off regular lower body clothing?: A Lot 6 Click Score: 14   End of Session Equipment Utilized During Treatment: Gait belt;Rollator (4 wheels)  Nurse Communication: Mobility status  Activity Tolerance: Patient tolerated treatment well Patient left: in bed;with call bell/phone within reach;with bed alarm set  OT Visit Diagnosis: Unsteadiness on feet (R26.81);Other abnormalities of gait and mobility (R26.89);Muscle weakness (generalized) (M62.81)                Time: [id number] OT Time Calculation (min): 22 min Charges:  OT General Charges $OT Visit: 1 Visit OT Evaluation $OT Eval Moderate Complexity: 1 Mod  Daliyah Sramek K, OTD, OTR/L SecureChat Preferred Acute Rehab [phone]   Ulla Gallo 09/14/2022, 4:34 PM

## 2022-09-14 NOTE — Evaluation (Signed)
Physical Therapy Evaluation Patient Details Name: Emily Hughes MRN: [id number] DOB: 1940-09-20 Today's Date: 09/14/2022  History of Present Illness  82 yo female presents to [facility] on 3/10 with AMS, falls. Workup for AME. PMH includes DM, CAD, CKD, HTN, DDD.  Clinical Impression  Pt presents with LE weakness, impaired cognition vs baseline, impaired mobility, poor balance with recent history of falls, and decreased activity tolerance vs baseline. Pt to benefit from acute PT to address deficits. Pt requiring mod +2 assist for transfer-level mobility, standing tolerance <15 seconds before fatiguing and needing to sit. Recommendations below. PT to progress mobility as tolerated, and will continue to follow acutely.         Recommendations for follow up therapy are one component of a multi-disciplinary discharge planning process, led by the attending physician.  Recommendations may be updated based on patient status, additional functional criteria and insurance authorization.  Follow Up Recommendations Skilled nursing-short term rehab (<3 hours/day) Can patient physically be transported by private vehicle: No    Assistance Recommended at Discharge Frequent or constant Supervision/Assistance  Patient can return home with the following  A lot of help with walking and/or transfers;A lot of help with bathing/dressing/bathroom    Equipment Recommendations None recommended by PT  Recommendations for Other Services       Functional Status Assessment Patient has had a recent decline in their functional status and demonstrates the ability to make significant improvements in function in a reasonable and predictable amount of time.     Precautions / Restrictions Precautions Precautions: Fall Restrictions Weight Bearing Restrictions: No      Mobility  Bed Mobility Overal bed mobility: Needs Assistance Bed Mobility: Sit to Supine, Supine to Sit     Supine to sit: Min assist, +2 for physical  assistance Sit to supine: Mod assist, +2 for physical assistance        Transfers Overall transfer level: Needs assistance Equipment used: Rollator (4 wheels) Transfers: Sit to/from Stand Sit to Stand: Mod assist, +2 physical assistance           General transfer comment: mod +2, cues to push from bed and for hand placement on RW, pt keeping trunk flexed. stand x2    Ambulation/Gait               General Gait Details: unable  Stairs            Wheelchair Mobility    Modified Rankin (Stroke Patients Only)       Balance Overall balance assessment: Needs assistance Sitting-balance support: Feet supported Sitting balance-Leahy Scale: Good Sitting balance - Comments: sits unsupported EOB   Standing balance support: Reliant on assistive device for balance, During functional activity Standing balance-Leahy Scale: Poor Standing balance comment: reliant on external support                             Pertinent Vitals/Pain Pain Assessment Pain Assessment: No/denies pain    Home Living Family/patient expects to be discharged to:: Private residence Living Arrangements: Alone Available Help at Discharge: Family;Available PRN/intermittently Type of Home: House Home Access: Level entry       Home Layout: One level Home Equipment: Rollator (4 wheels)      Prior Function Prior Level of Function : Independent/Modified Independent;Driving             Mobility Comments: ambulates with RW ADLs Comments: reports ind with ADLs/IADLs/drives     Hand Dominance  Dominant Hand: Right    Extremity/Trunk Assessment   Upper Extremity Assessment Upper Extremity Assessment: Defer to OT evaluation    Lower Extremity Assessment Lower Extremity Assessment: Generalized weakness    Cervical / Trunk Assessment Cervical / Trunk Assessment: Normal  Communication   Communication: No difficulties  Cognition Arousal/Alertness:  Awake/alert Behavior During Therapy: WFL for tasks assessed/performed, Flat affect Overall Cognitive Status: Impaired/Different from baseline Area of Impairment: Orientation, Memory, Attention, Following commands, Safety/judgement, Problem solving, Awareness                 Orientation Level: Time, Situation Current Attention Level: Focused Memory: Decreased short-term memory Following Commands: Follows one step commands inconsistently Safety/Judgement: Decreased awareness of safety, Decreased awareness of deficits Awareness: Intellectual Problem Solving: Slow processing, Decreased initiation, Difficulty sequencing, Requires verbal cues, Requires tactile cues General Comments: mod cues to perform basic single step tasks, pt oriented to self and place, perseverative on "[facility]" when asked other questions.        General Comments General comments (skin integrity, edema, etc.): VSS on RA    Exercises     Assessment/Plan    PT Assessment Patient needs continued PT services  PT Problem List Decreased strength;Decreased mobility;Decreased activity tolerance;Decreased balance;Decreased knowledge of use of DME;Decreased safety awareness;Decreased cognition       PT Treatment Interventions DME instruction;Therapeutic activities;Gait training;Therapeutic exercise;Patient/family education;Balance training;Stair training;Functional mobility training;Neuromuscular re-education    PT Goals (Current goals can be found in the Care Plan section)  Acute Rehab PT Goals Patient Stated Goal: home PT Goal Formulation: With patient Time For Goal Achievement: 09/28/22 Potential to Achieve Goals: Good    Frequency Min 3X/week     Co-evaluation PT/OT/SLP Co-Evaluation/Treatment: Yes Reason for Co-Treatment: Complexity of the patient's impairments (multi-system involvement);For patient/therapist safety PT goals addressed during session: Mobility/safety with mobility;Balance OT  goals addressed during session: ADL's and self-care;Strengthening/ROM       AM-PAC PT "6 Clicks" Mobility  Outcome Measure Help needed turning from your back to your side while in a flat bed without using bedrails?: A Lot Help needed moving from lying on your back to sitting on the side of a flat bed without using bedrails?: A Lot Help needed moving to and from a bed to a chair (including a wheelchair)?: A Lot Help needed standing up from a chair using your arms (e.g., wheelchair or bedside chair)?: A Lot Help needed to walk in hospital room?: Total Help needed climbing 3-5 steps with a railing? : Total 6 Click Score: 10    End of Session Equipment Utilized During Treatment: Gait belt Activity Tolerance: Patient limited by fatigue Patient left: in bed;with call bell/phone within reach;with bed alarm set Nurse Communication: Mobility status PT Visit Diagnosis: Other abnormalities of gait and mobility (R26.89);Muscle weakness (generalized) (M62.81)    Time: [id number] PT Time Calculation (min) (ACUTE ONLY): 19 min   Charges:   PT Evaluation $PT Eval Low Complexity: 1 Low          Arshan Jabs S, PT DPT Acute Rehabilitation Services Pager [phone]  Office [phone]   Louis Matte 09/14/2022, 4:37 PM

## 2022-09-14 NOTE — Progress Notes (Signed)
Subjective:  Patient says she feels well today, is awake and interactive. Family at bedside says that she seems improved compared to yesterday. No acute complaints. Objective Vital signs in last 24 hours: Vitals:   09/13/22 1607 09/13/22 2147 09/14/22 0420 09/14/22 0814  BP: (!) 150/79 (!) 154/69 (!) 155/77 (!) 175/82  Pulse:  (!) 54 (!) 56 (!) 59  Resp: '17 19 18 18  '$ Temp: (!) 97.4 F (36.3 C) 98 F (36.7 C) (!) 97.4 F (36.3 C) (!) 97.5 F (36.4 C)  TempSrc: Oral Oral Oral Oral  SpO2: 96% 100% 100% 100%  Weight:      Height:       Weight change: 3.1 kg  Intake/Output Summary (Last 24 hours) at 09/14/2022 0920 Last data filed at 09/14/2022 0420 Gross per 24 hour  Intake --  Output 300 ml  Net -300 ml   Labs: Basic Metabolic Panel: Recent Labs  Lab 09/12/22 1610 09/13/22 0024 09/13/22 0524  NA 139  --  141  K 5.3*  --  5.3*  CL 111  --  115*  CO2 21*  --  17*  GLUCOSE 196*  --  162*  BUN 43*  --  39*  CREATININE 4.67* 4.44* 4.26*  CALCIUM 10.0  --  9.4   Liver Function Tests: No results for input(s): "AST", "ALT", "ALKPHOS", "BILITOT", "PROT", "ALBUMIN" in the last 168 hours. No results for input(s): "LIPASE", "AMYLASE" in the last 168 hours. Recent Labs  Lab 09/13/22 0029  AMMONIA 24   CBC: Recent Labs  Lab 09/12/22 1610 09/13/22 0024 09/13/22 0524  WBC 6.4 6.7 6.4  NEUTROABS 4.7  --  5.1  HGB 8.9* 9.2* 8.1*  HCT 29.8* 29.0* 26.2*  MCV 87.4 85.8 87.9  PLT 230 224 216   Cardiac Enzymes: Recent Labs  Lab 09/13/22 0024  CKTOTAL 175   CBG: Recent Labs  Lab 09/13/22 1607 09/13/22 2150 09/14/22 0007 09/14/22 0422 09/14/22 0812  GLUCAP 148* 156* 171* 148* 115*    Iron Studies: No results for input(s): "IRON", "TIBC", "TRANSFERRIN", "FERRITIN" in the last 72 hours. Studies/Results: EEG adult  Result Date: 09/13/2022 Lora Havens, MD     09/13/2022  4:53 PM Patient Name: Emily Hughes MRN: [id number] Epilepsy Attending: Lora Havens  Referring Physician/Provider: Flora Lipps, MD Date: 09/13/2022 Duration: 22.06 mins Patient history: 82yo F with ams. EEG to evaluate for seizure Level of alertness: Awake, asleep AEDs during EEG study: None Technical aspects: This EEG study was done with scalp electrodes positioned according to the 10-20 International system of electrode placement. Electrical activity was reviewed with band pass filter of 1-'70Hz'$ , sensitivity of 7 uV/mm, display speed of 48m/sec with a '60Hz'$  notched filter applied as appropriate. EEG data were recorded continuously and digitally stored.  Video monitoring was available and reviewed as appropriate. Description: The posterior dominant rhythm consists of 8 Hz activity of moderate voltage (25-35 uV) seen predominantly in posterior head regions, symmetric and reactive to eye opening and eye closing. Sleep was characterized by vertex waves, sleep spindles (12 to 14 Hz), maximal frontocentral region. EEG showed intermittent generalized 3 to 6 Hz theta-delta slowing. Hyperventilation and photic stimulation were not performed.   ABNORMALITY - Intermittent slow, generalized IMPRESSION: This study is suggestive of mild diffuse encephalopathy, nonspecific etiology. No seizures or epileptiform discharges were seen throughout the recording. Priyanka [name]  CT HEAD WO CONTRAST (5MM)  Result Date: 09/12/2022 CLINICAL DATA:  Mental status change. EXAM: CT HEAD WITHOUT CONTRAST  TECHNIQUE: Contiguous axial images were obtained from the base of the skull through the vertex without intravenous contrast. RADIATION DOSE REDUCTION: This exam was performed according to the departmental dose-optimization program which includes automated exposure control, adjustment of the mA and/or kV according to patient size and/or use of iterative reconstruction technique. COMPARISON:  Brain CT 03/19/2022 FINDINGS: Brain: Ventricles and sulci are prominent compatible with atrophy. Periventricular and subcortical  white matter hypodensities compatible with chronic microvascular ischemic changes. Bilateral basal ganglia calcifications. No evidence for acute cortically based infarct, intracranial hemorrhage, mass lesion or mass effect. Vascular: No hyperdense vessel or unexpected calcification. Skull: Normal. Negative for fracture or focal lesion. Sinuses/Orbits: Paranasal sinuses well aerated. Mastoid air cells unremarkable. Orbits are unremarkable. Other: None IMPRESSION: 1. No acute intracranial process. 2. Atrophy and chronic microvascular ischemic changes. Electronically Signed   By: Lovey Newcomer M.D.   On: 09/12/2022 16:33   DG Chest Portable 1 View  Result Date: 09/12/2022 CLINICAL DATA:  Altered mental status. EXAM: PORTABLE CHEST 1 VIEW COMPARISON:  07/21/2021 FINDINGS: The heart size and mediastinal contours are within normal limits. Both lungs are clear. The visualized skeletal structures are unremarkable. IMPRESSION: No active disease. Electronically Signed   By: Nolon Nations M.D.   On: 09/12/2022 16:19   Medications:  Scheduled Medications:  calcitRIOL  0.25 mcg Oral Daily   cloNIDine  0.1 mg Oral BID   colchicine  0.6 mg Oral Daily   diltiazem  300 mg Oral Daily   ferrous sulfate  325 mg Oral BID WC   furosemide  40 mg Oral QPM   furosemide  80 mg Oral BH-q7a   heparin  5,000 Units Subcutaneous Q8H   insulin aspart  0-15 Units Subcutaneous Q4H   insulin glargine-yfgn  6 Units Subcutaneous Daily   levothyroxine  50 mcg Oral Daily   metoprolol succinate  50 mg Oral Daily   pantoprazole (PROTONIX) IV  40 mg Intravenous Daily   patiromer  8.4 g Oral Daily   sodium bicarbonate  650 mg Oral TID AC & HS   sucralfate  1 g Oral TID WC & HS    have reviewed scheduled and prn medications.  Physical Exam: Seated upright in bed, comfortable, NAD. Normal work of breathing on room air.  Skin is warm and dry. No extremity edema appreciated. Awake, alert, oriented to self, place, upcoming  holidays.  Assessment/Plan: HTN BP remains intermittently moderately-significantly elevated. Held off on restarting home lasix 03/11 as to not correct BP too rapidly. This will restart today. Other anti-hypertensive medications include clonidine, diltiazem, metoprolol. Documented UOP over the last 24h of 300 mL. Renal function largely unchanged.   AM labs reviewed: K 4.9 Chloride 115 CO2 17 BUN 39 Cr 4.30 (4.26 03/11) GFR 10  Plan: -Continue home lasix, clonidine, diltiazem, metoprolol -Trend renal function -Nephrology will sign off  CKD stage IV with metabolic acidosis and mild hyperkalemia Hx iron deficiency anemia  Renal function stable as above. -Continue sodium bicarbonate 650 mg TID -Continue calcitriol 0.25 mcg daily -Continue ferrous sulfate 325 mg BID -Avoid nephrotoxic agents, renally dose medications -Strict I's/O's -Trend renal function panel  Acute metabolic encephalopathy Low suspicion that this is related to CKD. Recommend working to normalize BP and minimize centrally acting agents while remainder of work-up takes place. Thus far has had large improvement in orientation and overall cognition with improvement of BP and minimization of centrally acting, renally cleared agents. Plan: -Ongoing encephalopathy work-up per primary team   Remainder of  hospital problems per primary team: Type 2 diabetes mellitus CAD Gout  Farrel Gordon, DO Internal Medicine PGY-2 09/14/2022,9:20 AM  LOS: 2 days

## 2022-09-14 NOTE — Plan of Care (Signed)

## 2022-09-14 NOTE — Progress Notes (Signed)
PROGRESS NOTE    Emily Hughes  [id number] DOB: 1940-11-09 DOA: 09/12/2022 PCP: Emily Frizzle, MD    Brief Narrative:  Patient is a 82 year old female with past medical history of CAD, CKD, HTN, diabetes mellitus type II presented to hospital with multiple episodes of falling and disorientation at home for 3 to 4 days.  Family stated that they were not sure if she was taking her medications.  In the ED, patient was confused.  Initial labs were unremarkable except for creatinine elevated at 4.6 at baseline.  Blood pressure was elevated at 201/104.Marland Kitchen  Urinalysis, chest x-ray and CT head scan was normal. WBC 6.7. FSBS 133.  Patient was able considered for admission to hospital for further evaluation and treatment.  After hospitalization, extensive workup was done which was notable for CKD alone.  Likely her symptoms were because of ongoing use of Flexeril and oxycodone.  Mentation has improved at this time.  Physical therapy saw the patient and has recommended skilled nursing facility at this time.    Assessment and plan.  Acute metabolic encephalopathy likely secondary to polypharmacy in the context of advanced CKD. Initially thought to be multifactorial likely from polypharmacy, hypertensive encephalopathy, uremia, potential opiate withdrawal.  Initial labs were relatively stable except for elevated creatinine level.  Ammonia level within range.  HIV nonreactive RPR was nonreactive.  TSH within normal range.  Vitamin B12 at 220.  Blood cultures have been negative so far in 1 day.  COVID influenza negative.  Urine drug screen was negative.  Urinalysis was negative as well.  Chest x-ray without any infiltrate.  CT head was negative. Patient on Roxicodone and Flexeril as outpatient discussed about discontinuation of Flexeril and cutting down on oxycodone.  She is agreeable to it.  She has been taking Roxicodone a long time for her knee arthritis.  EEG was unremarkable.  Urine drug screen did not  show any opiates.  Tylenol level was less than 10.  Will continue to closely monitor while in the hospital.  Chronic kidney disease IV/metabolic acidosis/mild hyperkalemia On Rocaltrol, bicarb, Veltassa at home.  BUN elevated at 39 with creatinine of 4.2 on presentation..  Nephrology was consulted due to question of uremia.  Neurology recommending sodium bicarbonate tablet and closer monitoring.  In the past close he had discussion with patient and the family and plan was not to proceed with dialysis.   On as needed Veltassa for hyperkalemia..   Diabetes mellitus type 2. Continue sliding scale insulin, Lantus.  Latest POC glucose of 117   CAD (coronary artery disease) Patient is on pravastatin, metoprolol, aspirin   Gout -Colchicine and Uloric resumed    Essential hypertension Continue clonidine, metoprolol and Cardizem. Closely monitor blood pressure.  Blood pressure has slightly improved this morning.  Continue to monitor closely.  Debility, deconditioning, bilateral knee arthritis.  Patient was seen by physical therapy today and recommended skilled nursing facility at this time.    DVT prophylaxis: heparin injection 5,000 Units Start: 09/13/22 0600   Code Status:     Code Status: Full Code  Disposition: Skilled nursing facility  Status is: Inpatient  Remains inpatient appropriate because: Need for rehabilitation.  Consultants:  Nephrology  Procedures:  None  Antimicrobials:  None.  Anti-infectives (From admission, onward)    None      Subjective: Today, patient was seen and examined at bedside.  Patient is more alert awake and communicating.  No mention of hallucination or confusion today.  Answering appropriately.  Family at  bedside.  Objective: Vitals:   2022/10/06 2147 09/14/22 0420 09/14/22 0814 09/14/22 1620  BP: (!) 154/69 (!) 155/77 (!) 175/82 (!) 143/67  Pulse: (!) 54 (!) 56 (!) 59 (!) 51  Resp: '19 18 18 18  '$ Temp: 98 F (36.7 C) (!) 97.4 F (36.3 C)  (!) 97.5 F (36.4 C) 98.4 F (36.9 C)  TempSrc: Oral Oral Oral Oral  SpO2: 100% 100% 100% 99%  Weight:      Height:        Intake/Output Summary (Last 24 hours) at 09/14/2022 1624 Last data filed at 09/14/2022 0420 Gross per 24 hour  Intake --  Output 300 ml  Net -300 ml    Filed Weights   09/12/22 1541 10/06/2022 0954  Weight: 85.6 kg 88.7 kg    Physical Examination: Body mass index is 34.64 kg/m.   General: Obese built, not in obvious distress, alert awake and Communicative. HENT:   No scleral pallor or icterus noted. Oral mucosa is moist.  Chest:    Diminished breath sounds bilaterally. No crackles or wheezes.  CVS: S1 &S2 heard. No murmur.  Regular rate and rhythm. Abdomen: Soft, nontender, nondistended.  Bowel sounds are heard.   Extremities: No cyanosis, clubbing or edema.  Peripheral pulses are palpable.  Bilateral knee joint line tenderness.   Psych: Alert, awake and oriented to time and place.  Was able to communicate well. CNS:  No cranial nerve deficits.  Moves extremities. Skin: Warm and dry.  No rashes noted.  Data Reviewed:   CBC: Recent Labs  Lab 09/12/22 1610 October 06, 2022 0024 10-06-2022 0524 09/14/22 0820  WBC 6.4 6.7 6.4 6.0  NEUTROABS 4.7  --  5.1  --   HGB 8.9* 9.2* 8.1* 8.7*  HCT 29.8* 29.0* 26.2* 28.2*  MCV 87.4 85.8 87.9 85.7  PLT 230 224 216 PLATELET CLUMPS NOTED ON SMEAR, UNABLE TO ESTIMATE     Basic Metabolic Panel: Recent Labs  Lab 09/12/22 1610 2022-10-06 0024 10-06-22 0524 09/14/22 0820  NA 139  --  141 141  K 5.3*  --  5.3* 4.9  CL 111  --  115* 115*  CO2 21*  --  17* 17*  GLUCOSE 196*  --  162* 111*  BUN 43*  --  39* 39*  CREATININE 4.67* 4.44* 4.26* 4.30*  CALCIUM 10.0  --  9.4 9.0     Liver Function Tests: No results for input(s): "AST", "ALT", "ALKPHOS", "BILITOT", "PROT", "ALBUMIN" in the last 168 hours.   Radiology Studies: EEG adult  Result Date: 06-Oct-2022 Lora Havens, MD     2022/10/06  4:53 PM Patient  Name: Emily Hughes MRN: [id number] Epilepsy Attending: Lora Havens Referring Physician/Provider: Flora Lipps, MD Date: 10-06-22 Duration: 22.06 mins Patient history: 82yo F with ams. EEG to evaluate for seizure Level of alertness: Awake, asleep AEDs during EEG study: None Technical aspects: This EEG study was done with scalp electrodes positioned according to the 10-20 International system of electrode placement. Electrical activity was reviewed with band pass filter of 1-'70Hz'$ , sensitivity of 7 uV/mm, display speed of 45m/sec with a '60Hz'$  notched filter applied as appropriate. EEG data were recorded continuously and digitally stored.  Video monitoring was available and reviewed as appropriate. Description: The posterior dominant rhythm consists of 8 Hz activity of moderate voltage (25-35 uV) seen predominantly in posterior head regions, symmetric and reactive to eye opening and eye closing. Sleep was characterized by vertex waves, sleep spindles (12 to 14 Hz), maximal frontocentral region. EEG  showed intermittent generalized 3 to 6 Hz theta-delta slowing. Hyperventilation and photic stimulation were not performed.   ABNORMALITY - Intermittent slow, generalized IMPRESSION: This study is suggestive of mild diffuse encephalopathy, nonspecific etiology. No seizures or epileptiform discharges were seen throughout the recording. Priyanka Barbra Sarks   CT HEAD WO CONTRAST (5MM)  Result Date: 09/12/2022 CLINICAL DATA:  Mental status change. EXAM: CT HEAD WITHOUT CONTRAST TECHNIQUE: Contiguous axial images were obtained from the base of the skull through the vertex without intravenous contrast. RADIATION DOSE REDUCTION: This exam was performed according to the departmental dose-optimization program which includes automated exposure control, adjustment of the mA and/or kV according to patient size and/or use of iterative reconstruction technique. COMPARISON:  Brain CT 03/19/2022 FINDINGS: Brain: Ventricles and  sulci are prominent compatible with atrophy. Periventricular and subcortical white matter hypodensities compatible with chronic microvascular ischemic changes. Bilateral basal ganglia calcifications. No evidence for acute cortically based infarct, intracranial hemorrhage, mass lesion or mass effect. Vascular: No hyperdense vessel or unexpected calcification. Skull: Normal. Negative for fracture or focal lesion. Sinuses/Orbits: Paranasal sinuses well aerated. Mastoid air cells unremarkable. Orbits are unremarkable. Other: None IMPRESSION: 1. No acute intracranial process. 2. Atrophy and chronic microvascular ischemic changes. Electronically Signed   By: Lovey Newcomer M.D.   On: 09/12/2022 16:33      LOS: 2 days     Flora Lipps, MD [facility] Available via Epic secure chat 7am-7pm After these hours, please refer to coverage provider listed on [email or web address] 09/14/2022, 4:24 PM

## 2022-09-15 LAB — CBC
HCT: 24.5 % — ABNORMAL LOW (ref 36.0–46.0)
Hemoglobin: 7.8 g/dL — ABNORMAL LOW (ref 12.0–15.0)
MCH: 27.1 pg (ref 26.0–34.0)
MCHC: 31.8 g/dL (ref 30.0–36.0)
MCV: 85.1 fL (ref 80.0–100.0)
Platelets: 220 10*3/uL (ref 150–400)
RBC: 2.88 MIL/uL — ABNORMAL LOW (ref 3.87–5.11)
RDW: 16.4 % — ABNORMAL HIGH (ref 11.5–15.5)
WBC: 5.2 10*3/uL (ref 4.0–10.5)
nRBC: 0 % (ref 0.0–0.2)

## 2022-09-15 LAB — BASIC METABOLIC PANEL
Anion gap: 9 (ref 5–15)
BUN: 36 mg/dL — ABNORMAL HIGH (ref 8–23)
CO2: 19 mmol/L — ABNORMAL LOW (ref 22–32)
Calcium: 8.6 mg/dL — ABNORMAL LOW (ref 8.9–10.3)
Chloride: 111 mmol/L (ref 98–111)
Creatinine, Ser: 4.44 mg/dL — ABNORMAL HIGH (ref 0.44–1.00)
GFR, Estimated: 10 mL/min — ABNORMAL LOW (ref >60)
Glucose, Bld: 105 mg/dL — ABNORMAL HIGH (ref 70–99)
Potassium: 4.4 mmol/L (ref 3.5–5.1)
Sodium: 139 mmol/L (ref 135–145)

## 2022-09-15 LAB — IRON AND TIBC
Iron: 52 ug/dL (ref 28–170)
Saturation Ratios: 32 % — ABNORMAL HIGH (ref 10.4–31.8)
TIBC: 164 ug/dL — ABNORMAL LOW (ref 250–450)
UIBC: 112 ug/dL

## 2022-09-15 LAB — FERRITIN: Ferritin: 303 ng/mL (ref 11–307)

## 2022-09-15 LAB — GLUCOSE, CAPILLARY
Glucose-Capillary: 101 mg/dL — ABNORMAL HIGH (ref 70–99)
Glucose-Capillary: 124 mg/dL — ABNORMAL HIGH (ref 70–99)
Glucose-Capillary: 125 mg/dL — ABNORMAL HIGH (ref 70–99)
Glucose-Capillary: 144 mg/dL — ABNORMAL HIGH (ref 70–99)
Glucose-Capillary: 158 mg/dL — ABNORMAL HIGH (ref 70–99)
Glucose-Capillary: 170 mg/dL — ABNORMAL HIGH (ref 70–99)
Glucose-Capillary: 80 mg/dL (ref 70–99)

## 2022-09-15 LAB — MAGNESIUM: Magnesium: 2 mg/dL (ref 1.7–2.4)

## 2022-09-15 MED ORDER — POLYETHYLENE GLYCOL 3350 17 G PO PACK
17.0000 g | PACK | Freq: Every day | ORAL | Status: DC
  Administered 2022-09-15 – 2022-09-16 (×2): 17 g via ORAL
  Filled 2022-09-15 (×2): qty 1

## 2022-09-15 MED ORDER — COLCHICINE 0.6 MG PO TABS: 0.6000 mg | ORAL_TABLET | ORAL | Status: DC

## 2022-09-15 MED ORDER — DOCUSATE SODIUM 100 MG PO CAPS
100.0000 mg | ORAL_CAPSULE | Freq: Two times a day (BID) | ORAL | Status: DC
  Administered 2022-09-15 – 2022-09-16 (×2): 100 mg via ORAL
  Filled 2022-09-15 (×2): qty 1

## 2022-09-15 NOTE — Progress Notes (Signed)
PROGRESS NOTE    Emily Hughes  [id number] DOB: [birth year] DOA: 09/12/2022 PCP: Susy Frizzle, MD    Brief Narrative:  Patient is a 82 year old female with past medical history of CAD, CKD, HTN, diabetes mellitus type II presented to hospital with multiple episodes of falling and disorientation at home for 3 to 4 days.  Family stated that they were not sure if she was taking her medications.  In the ED, patient was confused.  Initial labs were unremarkable except for creatinine elevated at 4.6 at baseline.  Blood pressure was elevated at 201/104.Marland Kitchen  Urinalysis, chest x-ray and CT head scan was normal. WBC 6.7. FSBS 133.  Patient was able considered for admission to hospital for further evaluation and treatment.  After hospitalization, extensive workup was done which was notable for CKD alone.  Likely her symptoms were because of ongoing use of Flexeril and oxycodone.  Mentation has improved at this time.  Physical therapy saw the patient and has recommended skilled nursing facility at this time due to extreme debility and deconditioning.    Assessment and plan.  Acute metabolic encephalopathy likely secondary to polypharmacy in the context of advanced CKD. Initially thought to be multifactorial likely from polypharmacy, hypertensive encephalopathy, uremia, potential opiate withdrawal.  Initial labs were relatively stable except for elevated creatinine level.  Ammonia level within range.  HIV nonreactive RPR was nonreactive.  TSH within normal range.  Vitamin B12 at 220.  Blood cultures have been negative so far in 2 days.  COVID, influenza negative.  Urine drug screen was negative.  Urinalysis was negative as well.  Chest x-ray without any infiltrate.  CT head was negative. Patient on Roxicodone and Flexeril as outpatient discussed about discontinuation of Flexeril and cutting down on oxycodone.  She is agreeable to it.  She has been taking Roxicodone a long time for her knee arthritis.  EEG  was unremarkable.  Urine drug screen did not show any opiates.  Tylenol level was less than 10.  Will continue to closely monitor while in the hospital.  Encephalopathy has improved at this time.  Chronic kidney disease IV/metabolic acidosis/mild hyperkalemia On Rocaltrol, bicarb, Veltassa at home.  BUN elevated at 39 with creatinine of 4.2 on presentation..  Nephrology was consulted due to question of uremia.  Neurology recommending sodium bicarbonate tablet and closer monitoring.  In the past close he had discussion with patient and the family and plan was not to proceed with dialysis.   On as needed Veltassa for hyperkalemia..  Latest potassium of 4.4.   Diabetes mellitus type 2. Continue sliding scale insulin, Lantus.  Latest POC glucose of 101   CAD (coronary artery disease) Patient is on pravastatin, metoprolol, aspirin   Gout -Colchicine and Uloric resumed    Essential hypertension Continue clonidine, metoprolol and Cardizem. Closely monitor blood pressure.  Blood pressure has slightly improved this morning.  Continue to monitor closely.  Debility, deconditioning, bilateral knee arthritis.  Patient was seen by physical therapy and recommended skilled nursing facility at this time.    DVT prophylaxis: heparin injection 5,000 Units Start: 09/13/22 0600   Code Status:     Code Status: Full Code  Disposition: Skilled nursing facility when bed is available.  Patient is medically stable for disposition.  Status is: Inpatient  Remains inpatient appropriate because: Need for skilled nursing facility rehabilitation.  Consultants:  Nephrology  Procedures:  None  Antimicrobials:  None.  Anti-infectives (From admission, onward)    None  Subjective: Today, patient was seen and examined at bedside.  Patient needed a lot of assistance on movement.  Patient's family at bedside.  Mental status improving but memory still impaired.  Spoke with patient's sister at bedside.  No  hallucinations confusion disorientation chest pain or shortness of breath..  Objective: Vitals:   09/14/22 1620 09/14/22 2109 09/15/22 0452 09/15/22 0800  BP: (!) 143/67 (!) 152/67 (!) 155/65 (!) 147/72  Pulse: (!) 51 (!) 54 (!) 51 (!) 54  Resp: '18 18 18 16  '$ Temp: 98.4 F (36.9 C) 97.8 F (36.6 C) 98.5 F (36.9 C) 97.7 F (36.5 C)  TempSrc: Oral Oral Oral Oral  SpO2: 99% 100% 100% 100%  Weight:      Height:        Intake/Output Summary (Last 24 hours) at 09/15/2022 1111 Last data filed at 09/15/2022 0805 Gross per 24 hour  Intake 240 ml  Output 500 ml  Net -260 ml    Filed Weights   09/12/22 1541 10/03/2022 0954  Weight: 85.6 kg 88.7 kg    Physical Examination: Body mass index is 34.64 kg/m.   General: Alert awake and Communicative, obese built, HENT:   No scleral pallor or icterus noted. Oral mucosa is moist.  Chest:    Diminished breath sounds bilaterally. No crackles or wheezes.  CVS: S1 &S2 heard. No murmur.  Regular rate and rhythm. Abdomen: Soft, nontender, nondistended.  Bowel sounds are heard.   Extremities: No cyanosis, clubbing or edema.  Peripheral pulses are palpable.  Bilateral knee joint tenderness.   Psych: Alert, awake and oriented to time and place.  Impaired Memory. CNS:  No cranial nerve deficits.  Able to move extremities. Skin: Warm and dry.  No rashes noted.  Data Reviewed:   CBC: Recent Labs  Lab 09/12/22 1610 10-03-2022 0024 2022-10-03 0524 09/14/22 0820 09/15/22 0248  WBC 6.4 6.7 6.4 6.0 5.2  NEUTROABS 4.7  --  5.1  --   --   HGB 8.9* 9.2* 8.1* 8.7* 7.8*  HCT 29.8* 29.0* 26.2* 28.2* 24.5*  MCV 87.4 85.8 87.9 85.7 85.1  PLT 230 224 216 PLATELET CLUMPS NOTED ON SMEAR, UNABLE TO ESTIMATE 220     Basic Metabolic Panel: Recent Labs  Lab 09/12/22 1610 Oct 03, 2022 0024 10/03/2022 0524 09/14/22 0820 09/15/22 0248  NA 139  --  141 141 139  K 5.3*  --  5.3* 4.9 4.4  CL 111  --  115* 115* 111  CO2 21*  --  17* 17* 19*  GLUCOSE 196*  --   162* 111* 105*  BUN 43*  --  39* 39* 36*  CREATININE 4.67* 4.44* 4.26* 4.30* 4.44*  CALCIUM 10.0  --  9.4 9.0 8.6*  MG  --   --   --   --  2.0     Liver Function Tests: No results for input(s): "AST", "ALT", "ALKPHOS", "BILITOT", "PROT", "ALBUMIN" in the last 168 hours.   Radiology Studies: EEG adult  Result Date: 2022/10/03 Lora Havens, MD     2022-10-03  4:53 PM Patient Name: Adreanna Mans MRN: [id number] Epilepsy Attending: Lora Havens Referring Physician/Provider: Flora Lipps, MD Date: 10/03/2022 Duration: 22.06 mins Patient history: 82yo F with ams. EEG to evaluate for seizure Level of alertness: Awake, asleep AEDs during EEG study: None Technical aspects: This EEG study was done with scalp electrodes positioned according to the 10-20 International system of electrode placement. Electrical activity was reviewed with band pass filter of 1-'70Hz'$ , sensitivity of 7  uV/mm, display speed of 46m/sec with a '60Hz'$  notched filter applied as appropriate. EEG data were recorded continuously and digitally stored.  Video monitoring was available and reviewed as appropriate. Description: The posterior dominant rhythm consists of 8 Hz activity of moderate voltage (25-35 uV) seen predominantly in posterior head regions, symmetric and reactive to eye opening and eye closing. Sleep was characterized by vertex waves, sleep spindles (12 to 14 Hz), maximal frontocentral region. EEG showed intermittent generalized 3 to 6 Hz theta-delta slowing. Hyperventilation and photic stimulation were not performed.   ABNORMALITY - Intermittent slow, generalized IMPRESSION: This study is suggestive of mild diffuse encephalopathy, nonspecific etiology. No seizures or epileptiform discharges were seen throughout the recording. PBeadle     LOS: 3 days     LFlora Lipps MD [facility] Available via Epic secure chat 7am-7pm After these hours, please refer to coverage provider listed on  [email or web address] 09/15/2022, 11:11 AM

## 2022-09-15 NOTE — Progress Notes (Signed)
Mobility Specialist Progress Note   09/15/22 1530  Mobility  Activity Transferred from chair to bed  Level of Assistance +2 (takes two people) (modA)  Information systems manager Ambulated (ft) 4 ft  Activity Response Tolerated well  Mobility Referral Yes  $Mobility charge 1 Mobility   RN requesting assistance to get pt from chair to bed d/t fatigue and tiredness. Required +2A modA to stand and pivot pt, pt remaining in flexed position the entire time even w/ cues given. Pt returned backed to bed w/o fault but fatiguing quickly. Pt left supine w/ call bell in reach and bed alarm on.  Holland Falling Mobility Specialist Please contact via SecureChat or  Rehab office at [phone]

## 2022-09-15 NOTE — Care Management Important Message (Signed)
Important Message  Patient Details  Name: Emily Hughes MRN: [id number] Date of Birth: 03-19-1941   Medicare Important Message Given:  Yes     Tayt Moyers Montine Circle 09/15/2022, 3:02 PM

## 2022-09-15 NOTE — TOC Initial Note (Signed)
Transition of Care [facility]) - Initial/Assessment Note    Patient Details  Name: Emily Hughes MRN: [id number] Date of Birth: [birth year]  Transition of Care [facility]) CM/SW Contact:    Milinda Antis, [place] Phone Number: 09/15/2022, 10:28 AM  Clinical Narrative:                 CSW received consult for possible SNF placement at time of discharge. CSW spoke with patient.   Patient and sister expressed understanding of PT recommendation and is agreeable to SNF placement at time of discharge. Patient stated that she has not been to a SNF previously and was reminded by her sister that she was at [facility] a year or two ago.  The [facility] reports [facility] for [facility]. CSW discussed insurance authorization process and will provide Medicare SNF ratings list. CSW will send out referrals for review and provide bed offers as available.   Skilled Nursing Rehab Facilities-   [email or web address]   Ratings out of 5 stars (5 the highest)   Name Address  Phone # [place] Inspection Overall  [facility] [address] [place] '4 5 2 3  '$ [facility]  [address], [place] [phone] '4 2 5 5  '$ [facility] [place], [place] '1 3 1 1  '$ [place], [place] '2 2 4 4  '$ [facility] [address] [place] '2 1 2 1  '$ [place]. [address], [place] [phone] '3 3 4 4  '$ [facility] [address] [place] '4 1 3 2  '$ [facility] [address], [place] '4 1 3 2  '$ [address] ([place]) [place], [place] [phone] '3 1 2 1  '$ [facility] [address], [place] [phone] '3 1 1 1  '$ [facility] [address] [facility] [phone] '3 2 2 2  '$ [facility] [place] [place]. [place], [place] [phone] '3 1 1 1  '$ Dustin Flock  [address] [id number] '4 2 4 4          '$ [place], [place] '4 1 3 2  '$ [facility] [place] [address] [place] '3 1 5 4  '$ [place], [place] [phone] '1 1 2 1  '$ [facility] [address], [employer] [phone] '2 2 4 4          '$ [place] (no [facility]) [place] [address], [place] [phone] '5 5 5 5  '$ [facility] (No Humana) [phone] [address], [place] '4 1 4 3  '$ [facility] (No UHC) [place], [place] '5 5 5 5  '$ [facility] [address] [phone] '2 3 5 5  '$ [facility] [place] [address] [place] '1 1 2 1  '$ [facility] [address] [place] '3 1 1 1  '$ [place], [place] '5 2 5 5  '$ [facility]  [address], [place] '2 2 1 1  '$ [facility] [address], [place] '3 2 1 1  '$ [facility] [place], [place] '2 2 2 2          '$ [facility] [address], [place] '1 1 1 1  '$ [place] [address], [place]  [phone] '2 4 3 3  '$ [facility] [place] [address], [place] [phone] '3 2 3 3  '$ [place] [address] [place] '2 1 1 1  '$ [place] (No Humana) [address] [place], [place] [phone] '2 2 3 3  '$  [facility]) [address], [place] [phone] '2 1 1 1          '$ [facility] [place], [place] '5 4 5 5  '$ [facility])  [phone], [place] '2 1 2 1  '$ [facility]) [place] [address] [place] '3 1 4 3  '$ [place] [address], [place] '3 3 4 4  '$ [address] [place], [place] '2 3 1 1   '$ [facility]) [address] [place] [phone] '2 1 4 3    '$ Expected Discharge Plan: [place] Barriers to Discharge: Insurance Authorization, Continued Medical Work up, SNF Pending bed offer   Patient Goals and CMS Choice Patient states their goals for this hospitalization and ongoing recovery are:: To go to rehab CMS Medicare.gov Compare Post Acute Care list provided to:: Patient Represenative (must comment) Choice offered to / list presented to : Sibling      Expected Discharge Plan and Services In-house Referral: Clinical Social Work     Living arrangements for the past 2 months: Single Family Home                                      Prior Living Arrangements/Services Living arrangements for the past 2 months: Single Family Home Lives with:: Self Patient language and need for interpreter reviewed:: Yes Do you feel safe going back to the place where you live?: Yes      Need for Family Participation in Patient Care: Yes (Comment) Care giver support system in place?: Yes (comment)   Criminal Activity/Legal Involvement Pertinent to Current Situation/Hospitalization: No - Comment as needed  Activities of Daily Living Home Assistive Devices/Equipment: Cane (specify quad or straight), Walker (specify type) ADL Screening (condition at time of admission) Patient's cognitive ability adequate to safely complete daily activities?: No Is the patient deaf or have difficulty hearing?: No Does the patient have difficulty seeing, even when wearing glasses/contacts?: No Does the patient have difficulty concentrating, remembering, or making decisions?: Yes Patient able to express need for assistance with ADLs?: Yes Does the patient have difficulty dressing or bathing?: Yes Independently performs ADLs?: No Communication: Independent Dressing (OT): Dependent Is this a change from baseline?: Change from baseline, expected to last  >3 days Grooming: Dependent Is this a change from baseline?: Change from baseline, expected to last >3 days Feeding: Needs assistance Is this a change from baseline?: Change from baseline, expected to last >3 days Bathing: Dependent Is this a change from baseline?: Change from baseline, expected to last >3 days Toileting: Dependent Is this a change from baseline?: Change from baseline, expected to last >3days In/Out Bed: Dependent Is this a change from baseline?: Change from baseline, expected to last >3 days Walks in Home: Independent Does the patient have difficulty walking or climbing stairs?: Yes Weakness of Legs: Both Weakness of Arms/Hands: Both  Permission Sought/Granted   Permission granted to share information with : Yes, Verbal Permission Granted     Permission granted to share info  w AGENCY: SNFs        Emotional Assessment Appearance:: Appears stated age Attitude/Demeanor/Rapport: Engaged   Orientation: : Oriented to Situation, Oriented to Self, Oriented to Place Alcohol / Substance Use: Not Applicable Psych Involvement: No (comment)  Admission diagnosis:  Altered mental status, unspecified altered mental status type [id number] Acute metabolic encephalopathy [id number] Patient Active Problem List   Diagnosis Date Noted   Anemia in chronic kidney disease 06/09/2021   Benign neoplasm of colon 06/09/2021   Constipation 06/09/2021   Iron deficiency anemia 06/09/2021   Long term (current) use of insulin ([place]) 06/09/2021   Personal history of colonic polyps 06/09/2021   Acute idiopathic gout of multiple sites    Severe protein-calorie malnutrition ([place])    Acute urinary retention 11/18/2018   Acute renal failure superimposed on stage 4 chronic kidney disease ([place]) [id number]   Acute metabolic encephalopathy [id number]   Cellulitis and abscess of buttock    Abnormal LFTs    Abscess of left buttock 11/08/2018   Sepsis ([place]) 11/08/2018   Atrial fibrillation with RVR  ([place]) 11/08/2018   Elevated troponin 11/08/2018   Convulsions ([place]) 07/24/2015   Type 2 diabetes mellitus with ketoacidosis without coma ([place]) 07/24/2015   Leukocytosis    Chronic kidney disease (CKD), stage IV (severe) ([place]) 06/13/2015   Seizures ([place]) 06/12/2015   DKA (diabetic ketoacidoses) 06/12/2015   Seizure ([address]) 06/12/2015   High cholesterol    CAD (coronary artery disease)    Proteinuria    GERD (gastroesophageal reflux disease)    DYSLIPIDEMIA 02/27/2010   OBESITY, MORBID 02/27/2010   DM 02/24/2010   RHEUMATIC FEVER 02/24/2010   Essential hypertension 02/24/2010   RENAL INSUFFICIENCY, CHRONIC 02/24/2010   PCP:  Susy Frizzle, MD Pharmacy:   [place], [place] [address] Phone: [phone] Fax: [phone]  [facility] [place], [place] [address] Phone: [phone] Fax: [phone]  [facility] ([place], [place] [facility] [address] [place] [address] Phone: [phone] Fax: [place], [place] - [place] AT [facility] [address] [place] [address] Phone: [phone] Fax: [place], [address] [place] [address] Phone: [phone] Fax: [phone]     Social Determinants of Health (SDOH) Social History: SDOH Screenings   Food Insecurity: No Food Insecurity (03/31/2022)  Housing: Low Risk  (03/31/2022)  Transportation Needs: No Transportation Needs (03/31/2022)  Utilities: Not At Risk (03/31/2022)  Alcohol Screen: Low Risk  (03/31/2022)  Depression (PHQ2-9): Low Risk  (03/31/2022)  Financial Resource Strain: Low Risk  (03/31/2022)  Physical Activity: Inactive (03/31/2022)   Social Connections: Moderately Integrated (03/31/2022)  Stress: No Stress Concern Present (03/31/2022)  Tobacco Use: Medium Risk (09/12/2022)   SDOH Interventions:     Readmission Risk Interventions     No data to display

## 2022-09-15 NOTE — TOC Progression Note (Addendum)
Transition of Care [facility]) - Initial/Assessment Note    Patient Details  Name: Emily Hughes MRN: [id number] Date of Birth: 01-12-1941  [facility]) CM/SW Contact:    Milinda Antis, LCSWA Phone Number: 09/15/2022, 2:22 PM  Clinical Narrative:                 LCSW contacted the patient's facility of choice, [facility].  The facility can accept the patient tomorrow pending insurance authorization is approved.  LCSW submitted request for insurance auth.  TOC following.   Expected Discharge Plan: Skilled Nursing Facility Barriers to Discharge: Ship broker, Continued Medical Work up, SNF Pending bed offer   Patient Goals and CMS Choice Patient states their goals for this hospitalization and ongoing recovery are:: To go to rehab CMS Medicare.gov Compare Post Acute Care list provided to:: Patient Represenative (must comment) Choice offered to / list presented to : Sibling      Expected Discharge Plan and Services In-house Referral: Clinical Social Work     Living arrangements for the past 2 months: Single Family Home                                      Prior Living Arrangements/Services Living arrangements for the past 2 months: Single Family Home Lives with:: Self Patient language and need for interpreter reviewed:: Yes Do you feel safe going back to the place where you live?: Yes      Need for Family Participation in Patient Care: Yes (Comment) Care giver support system in place?: Yes (comment)   Criminal Activity/Legal Involvement Pertinent to Current Situation/Hospitalization: No - Comment as needed  Activities of Daily Living Home Assistive Devices/Equipment: Cane (specify quad or straight), Walker (specify type) ADL Screening (condition at time of admission) Patient's cognitive ability adequate to safely complete daily activities?: No Is the patient deaf or have difficulty hearing?: No Does the patient have difficulty seeing,  even when wearing glasses/contacts?: No Does the patient have difficulty concentrating, remembering, or making decisions?: Yes Patient able to express need for assistance with ADLs?: Yes Does the patient have difficulty dressing or bathing?: Yes Independently performs ADLs?: No Communication: Independent Dressing (OT): Dependent Is this a change from baseline?: Change from baseline, expected to last >3 days Grooming: Dependent Is this a change from baseline?: Change from baseline, expected to last >3 days Feeding: Needs assistance Is this a change from baseline?: Change from baseline, expected to last >3 days Bathing: Dependent Is this a change from baseline?: Change from baseline, expected to last >3 days Toileting: Dependent Is this a change from baseline?: Change from baseline, expected to last >3days In/Out Bed: Dependent Is this a change from baseline?: Change from baseline, expected to last >3 days Walks in Home: Independent Does the patient have difficulty walking or climbing stairs?: Yes Weakness of Legs: Both Weakness of Arms/Hands: Both  Permission Sought/Granted   Permission granted to share information with : Yes, Verbal Permission Granted     Permission granted to share info w AGENCY: SNFs        Emotional Assessment Appearance:: Appears stated age Attitude/Demeanor/Rapport: Engaged   Orientation: : Oriented to Situation, Oriented to Self, Oriented to Place Alcohol / Substance Use: Not Applicable Psych Involvement: No (comment)  Admission diagnosis:  Altered mental status, unspecified altered mental status type [id number] Acute metabolic encephalopathy [id number] Patient Active Problem List   Diagnosis Date Noted  Anemia in chronic kidney disease 06/09/2021   Benign neoplasm of colon 06/09/2021   Constipation 06/09/2021   Iron deficiency anemia 06/09/2021   Long term (current) use of insulin ([place]) 06/09/2021   Personal history of colonic polyps 06/09/2021    Acute idiopathic gout of multiple sites    Severe protein-calorie malnutrition ([place])    Acute urinary retention 11/18/2018   Acute renal failure superimposed on stage 4 chronic kidney disease ([place]) [id number]   Acute metabolic encephalopathy [id number]   Cellulitis and abscess of buttock    Abnormal LFTs    Abscess of left buttock 11/08/2018   Sepsis ([place]) 11/08/2018   Atrial fibrillation with RVR ([place]) 11/08/2018   Elevated troponin 11/08/2018   Convulsions ([place]) 07/24/2015   Type 2 diabetes mellitus with ketoacidosis without coma ([place]) 07/24/2015   Leukocytosis    Chronic kidney disease (CKD), stage IV (severe) ([place]) 06/13/2015   Seizures ([place]) 06/12/2015   DKA (diabetic ketoacidoses) 06/12/2015   Seizure ([place]) 06/12/2015   High cholesterol    CAD (coronary artery disease)    Proteinuria    GERD (gastroesophageal reflux disease)    DYSLIPIDEMIA 02/27/2010   OBESITY, MORBID 02/27/2010   DM 02/24/2010   RHEUMATIC FEVER 02/24/2010   Essential hypertension 02/24/2010   RENAL INSUFFICIENCY, CHRONIC 02/24/2010   PCP:  Susy Frizzle, MD Pharmacy:   [place], [place] [address] Phone: [phone] Fax: [phone]  [facility] [place], [place] [address] Phone: [phone] Fax: [phone]  [facility] ([place], [facility] [address] [place] [address] Phone: [phone] Fax: [place], [place] AT [facility] [address] [place] [address] Phone: [phone] Fax: [place], [place] [address] [place] [address] Phone: [phone] Fax: [phone]     Social Determinants of  Health (SDOH) Social History: SDOH Screenings   Food Insecurity: No Food Insecurity (03/31/2022)  Housing: Low Risk  (03/31/2022)  Transportation Needs: No Transportation Needs (03/31/2022)  Utilities: Not At Risk (03/31/2022)  Alcohol Screen: Low Risk  (03/31/2022)  Depression (PHQ2-9): Low Risk  (03/31/2022)  Financial Resource Strain: Low Risk  (03/31/2022)  Physical Activity: Inactive (03/31/2022)  Social Connections: Moderately Integrated (03/31/2022)  Stress: No Stress Concern Present (03/31/2022)  Tobacco Use: Medium Risk (09/12/2022)   SDOH Interventions:     Readmission Risk Interventions     No data to display

## 2022-09-15 NOTE — Progress Notes (Signed)
Mobility Specialist Progress Note   09/15/22 1120  Mobility  Activity Transferred from bed to chair  Level of Assistance +2 (takes two people) Education officer, museum)  Assistive Device Stedy  Activity Response Tolerated well  Mobility Referral Yes  $Mobility charge 1 Mobility   Received at EOB w/ RN. Required +2A minA to stand in stedy. Transferred w/o fault. Left w/ cal bell in reach and chair alarm on.    Holland Falling Mobility Specialist Please contact via SecureChat or  Rehab office at [phone]

## 2022-09-15 NOTE — NC FL2 (Signed)
[place] LEVEL OF CARE FORM     IDENTIFICATION  Patient Name: Emily Hughes Birthdate: 10/03/1940 Sex: female Admission Date (Current Location): 09/12/2022  [facility] and [place] Number:  Herbalist and Address:  The . [facility] [place] [address] [place], [place] [address]      Provider Number: [id number]  Attending Physician Name and Address:  Flora Lipps, MD  Relative Name and Phone Number:  Thomasenia Sales (Sister) [phone]    Current Level of Care: Hospital Recommended Level of Care: [place] Prior Approval Number:    Date Approved/Denied:   PASRR Number: [id number]  Discharge Plan: SNF    Current Diagnoses: Patient Active Problem List   Diagnosis Date Noted   Anemia in chronic kidney disease 06/09/2021   Benign neoplasm of colon 06/09/2021   Constipation 06/09/2021   Iron deficiency anemia 06/09/2021   Long term (current) use of insulin ([place]) 06/09/2021   Personal history of colonic polyps 06/09/2021   Acute idiopathic gout of multiple sites    Severe protein-calorie malnutrition ([place])    Acute urinary retention 11/18/2018   Acute renal failure superimposed on stage 4 chronic kidney disease ([place]) [id number]   Acute metabolic encephalopathy [id number]   Cellulitis and abscess of buttock    Abnormal LFTs    Abscess of left buttock 11/08/2018   Sepsis ([place]) 11/08/2018   Atrial fibrillation with RVR ([place]) 11/08/2018   Elevated troponin 11/08/2018   Convulsions ([place]) 07/24/2015   Type 2 diabetes mellitus with ketoacidosis without coma ([place]) 07/24/2015   Leukocytosis    Chronic kidney disease (CKD), stage IV (severe) ([place]) 06/13/2015   Seizures ([place]) 06/12/2015   DKA (diabetic ketoacidoses) 06/12/2015   Seizure ([place]) 06/12/2015   High cholesterol    CAD (coronary artery disease)    Proteinuria    GERD (gastroesophageal reflux disease)    DYSLIPIDEMIA 02/27/2010   OBESITY, MORBID 02/27/2010    DM 02/24/2010   RHEUMATIC FEVER 02/24/2010   Essential hypertension 02/24/2010   RENAL INSUFFICIENCY, CHRONIC 02/24/2010    Orientation RESPIRATION BLADDER Height & Weight     Self, Time, Situation, Place  Normal Incontinent Weight: 195 lb 8.8 oz (88.7 kg) Height:  '5\' 3"'$  (160 cm)  BEHAVIORAL SYMPTOMS/MOOD NEUROLOGICAL BOWEL NUTRITION STATUS      Continent Diet (see d/c summary)  AMBULATORY STATUS COMMUNICATION OF NEEDS Skin   Total Care Verbally Normal                       Personal Care Assistance Level of Assistance  Bathing, Feeding, Dressing Bathing Assistance: Limited assistance Feeding assistance: Independent Dressing Assistance: Limited assistance     Functional Limitations Info  Sight, Speech, Hearing Sight Info: Adequate Hearing Info: Adequate Speech Info: Adequate    SPECIAL CARE FACTORS FREQUENCY  PT (By licensed PT), OT (By licensed OT)     PT Frequency: 5x/ week OT Frequency: 5x/ week            Contractures Contractures Info: Not present    Additional Factors Info  Insulin Sliding Scale, Allergies, Code Status Code Status Info: Full Allergies Info: 5-alpha Reductase Inhibitors  Ace Inhibitors  Actos (Pioglitazone Hydrochloride)  Angiotensin Receptor Blockers  Dust Mite Extract  Dye Fdc Red (Fd&c Red #3 (Erythrosine Sodium))  Fish Allergy  Iodinated Contrast Media  Pioglitazone   Insulin Sliding Scale Info: see d/c       Current Medications (09/15/2022):  This is the current hospital  active medication list Current Facility-Administered Medications  Medication Dose Route Frequency Provider Last Rate Last Admin   acetaminophen (TYLENOL) tablet 650 mg  650 mg Oral Q6H PRN Crosley, Debby, MD       Or   acetaminophen (TYLENOL) suppository 650 mg  650 mg Rectal Q6H PRN Crosley, Debby, MD       calcitRIOL (ROCALTROL) capsule 0.25 mcg  0.25 mcg Oral Daily Crosley, Debby, MD   0.25 mcg at 09/15/22 [id number]   cloNIDine (CATAPRES) tablet 0.1 mg  0.1 mg  Oral BID Crosley, Debby, MD   0.1 mg at 09/15/22 0852   [START ON 09/17/2022] colchicine tablet 0.6 mg  0.6 mg Oral QODAY Pokhrel, Laxman, MD       Darbepoetin Alfa (ARANESP) injection 200 mcg  200 mcg Subcutaneous Q Tue-1800 Corliss Parish, MD   200 mcg at 09/14/22 1632   diltiazem (CARDIZEM CD) 24 hr capsule 300 mg  300 mg Oral Daily Crosley, Debby, MD   300 mg at 09/15/22 0913   ferrous sulfate tablet 325 mg  325 mg Oral BID WC Farrel Gordon, DO   325 mg at 09/15/22 0852   furosemide (LASIX) tablet 40 mg  40 mg Oral QPM Farrel Gordon, DO   40 mg at 09/14/22 1632   furosemide (LASIX) tablet 80 mg  80 mg Oral Cathlean Marseilles, Raquel Sarna, DO   80 mg at 09/15/22 0657   heparin injection 5,000 Units  5,000 Units Subcutaneous Q8H Crosley, Debby, MD   5,000 Units at 09/15/22 0657   insulin aspart (novoLOG) injection 0-15 Units  0-15 Units Subcutaneous Q4H Quintella Baton, MD   2 Units at 09/15/22 0124   insulin glargine-yfgn (SEMGLEE) injection 6 Units  6 Units Subcutaneous Daily Quintella Baton, MD   6 Units at 09/15/22 0851   levothyroxine (SYNTHROID) tablet 50 mcg  50 mcg Oral Daily Crosley, Debby, MD   50 mcg at 09/15/22 0657   metoprolol succinate (TOPROL-XL) 24 hr tablet 50 mg  50 mg Oral Daily Pokhrel, Laxman, MD   50 mg at 09/14/22 0841   metoprolol tartrate (LOPRESSOR) injection 5 mg  5 mg Intravenous Q4H PRN Crosley, Debby, MD   5 mg at 09/13/22 0142   ondansetron (ZOFRAN) tablet 4 mg  4 mg Oral Q6H PRN Crosley, Debby, MD       Or   ondansetron (ZOFRAN) injection 4 mg  4 mg Intravenous Q6H PRN Crosley, Debby, MD       pantoprazole (PROTONIX) injection 40 mg  40 mg Intravenous Daily Crosley, Debby, MD   40 mg at 09/15/22 0851   sodium bicarbonate tablet 650 mg  650 mg Oral TID AC & HS Crosley, Debby, MD   650 mg at 09/15/22 0946   sucralfate (CARAFATE) tablet 1 g  1 g Oral TID WC & HS Crosley, Debby, MD   1 g at 09/15/22 [id number]     Discharge Medications: Please see discharge summary for a list of  discharge medications.  Relevant Imaging Results:  Relevant Lab Results:   Additional Information SS# [id number]  Paulene Floor Brooklee Michelin, LCSWA

## 2022-09-16 LAB — GLUCOSE, CAPILLARY
Glucose-Capillary: 152 mg/dL — ABNORMAL HIGH (ref 70–99)
Glucose-Capillary: 226 mg/dL — ABNORMAL HIGH (ref 70–99)

## 2022-09-16 MED ORDER — PANTOPRAZOLE SODIUM 40 MG PO TBEC: 40.0000 mg | DELAYED_RELEASE_TABLET | Freq: Every day | ORAL | Status: DC

## 2022-09-16 MED ORDER — LANTUS SOLOSTAR 100 UNIT/ML ~~LOC~~ SOPN: 10.0000 [IU] | PEN_INJECTOR | Freq: Every day | SUBCUTANEOUS | Status: DC

## 2022-09-16 MED ORDER — INSULIN ASPART 100 UNIT/ML IJ SOLN
0.0000 [IU] | Freq: Three times a day (TID) | INTRAMUSCULAR | Status: DC
  Administered 2022-09-16: 3 [IU] via SUBCUTANEOUS

## 2022-09-16 MED ORDER — COLCHICINE 0.6 MG PO TABS: 0.6000 mg | ORAL_TABLET | ORAL | 0 refills | Status: DC

## 2022-09-16 MED ORDER — DILTIAZEM HCL ER BEADS 300 MG PO CP24: 300.0000 mg | ORAL_CAPSULE | Freq: Every day | ORAL | 2 refills | Status: DC

## 2022-09-16 NOTE — TOC Progression Note (Signed)
Transition of Care [facility]) - Initial/Assessment Note    Patient Details  Name: Emily Hughes MRN: [id number] Date of Birth: Oct 24, 1940  Transition of Care [facility]) CM/SW Contact:    Milinda Antis, [place] Phone Number: 09/16/2022, 8:37 AM  Clinical Narrative:                 Insurance authorization is still pending.  TOC following.  Expected Discharge Plan: Skilled Nursing Facility Barriers to Discharge: Ship broker, Continued Medical Work up, SNF Pending bed offer   Patient Goals and CMS Choice Patient states their goals for this hospitalization and ongoing recovery are:: To go to rehab CMS Medicare.gov Compare Post Acute Care list provided to:: Patient Represenative (must comment) Choice offered to / list presented to : Sibling      Expected Discharge Plan and Services In-house Referral: Clinical Social Work     Living arrangements for the past 2 months: Single Family Home                                      Prior Living Arrangements/Services Living arrangements for the past 2 months: Single Family Home Lives with:: Self Patient language and need for interpreter reviewed:: Yes Do you feel safe going back to the place where you live?: Yes      Need for Family Participation in Patient Care: Yes (Comment) Care giver support system in place?: Yes (comment)   Criminal Activity/Legal Involvement Pertinent to Current Situation/Hospitalization: No - Comment as needed  Activities of Daily Living Home Assistive Devices/Equipment: Cane (specify quad or straight), Walker (specify type) ADL Screening (condition at time of admission) Patient's cognitive ability adequate to safely complete daily activities?: No Is the patient deaf or have difficulty hearing?: No Does the patient have difficulty seeing, even when wearing glasses/contacts?: No Does the patient have difficulty concentrating, remembering, or making decisions?: Yes Patient able to express need for  assistance with ADLs?: Yes Does the patient have difficulty dressing or bathing?: Yes Independently performs ADLs?: No Communication: Independent Dressing (OT): Dependent Is this a change from baseline?: Change from baseline, expected to last >3 days Grooming: Dependent Is this a change from baseline?: Change from baseline, expected to last >3 days Feeding: Needs assistance Is this a change from baseline?: Change from baseline, expected to last >3 days Bathing: Dependent Is this a change from baseline?: Change from baseline, expected to last >3 days Toileting: Dependent Is this a change from baseline?: Change from baseline, expected to last >3days In/Out Bed: Dependent Is this a change from baseline?: Change from baseline, expected to last >3 days Walks in Home: Independent Does the patient have difficulty walking or climbing stairs?: Yes Weakness of Legs: Both Weakness of Arms/Hands: Both  Permission Sought/Granted   Permission granted to share information with : Yes, Verbal Permission Granted     Permission granted to share info w AGENCY: SNFs        Emotional Assessment Appearance:: Appears stated age Attitude/Demeanor/Rapport: Engaged   Orientation: : Oriented to Situation, Oriented to Self, Oriented to Place Alcohol / Substance Use: Not Applicable Psych Involvement: No (comment)  Admission diagnosis:  Altered mental status, unspecified altered mental status type [id number] Acute metabolic encephalopathy [id number] Patient Active Problem List   Diagnosis Date Noted   Anemia in chronic kidney disease 06/09/2021   Benign neoplasm of colon 06/09/2021   Constipation 06/09/2021   Iron deficiency anemia 06/09/2021  Long term (current) use of insulin ([place]) 06/09/2021   Personal history of colonic polyps 06/09/2021   Acute idiopathic gout of multiple sites    Severe protein-calorie malnutrition ([place])    Acute urinary retention 11/18/2018   Acute renal failure superimposed  on stage 4 chronic kidney disease ([place]) [id number]   Acute metabolic encephalopathy [id number]   Cellulitis and abscess of buttock    Abnormal LFTs    Abscess of left buttock 11/08/2018   Sepsis ([place]) 11/08/2018   Atrial fibrillation with RVR ([place]) 11/08/2018   Elevated troponin 11/08/2018   Convulsions ([place]) 07/24/2015   Type 2 diabetes mellitus with ketoacidosis without coma ([place]) 07/24/2015   Leukocytosis    Chronic kidney disease (CKD), stage IV (severe) ([place]) 06/13/2015   Seizures ([place]) 06/12/2015   DKA (diabetic ketoacidoses) 06/12/2015   Seizure ([place]) 06/12/2015   High cholesterol    CAD (coronary artery disease)    Proteinuria    GERD (gastroesophageal reflux disease)    DYSLIPIDEMIA 02/27/2010   OBESITY, MORBID 02/27/2010   DM 02/24/2010   RHEUMATIC FEVER 02/24/2010   Essential hypertension 02/24/2010   RENAL INSUFFICIENCY, CHRONIC 02/24/2010   PCP:  Susy Frizzle, MD Pharmacy:   [place], [place] [address] Phone: [phone] Fax: [phone]  [facility] [place], [address] [place] [address] Phone: [phone] Fax: [phone]  [facility] ([place], [place] [facility] [address] [place] [address] Phone: [phone] Fax: [place], [place] - [place] [facility] [address] [place] [address] Phone: [phone] Fax: [place], [address] [place] [address] Phone: [phone] Fax: [phone]     Social Determinants of Health (SDOH) Social History: SDOH Screenings   Food Insecurity: No Food Insecurity (03/31/2022)  Housing: Low Risk  (03/31/2022)  Transportation Needs: No  Transportation Needs (03/31/2022)  Utilities: Not At Risk (03/31/2022)  Alcohol Screen: Low Risk  (03/31/2022)  Depression (PHQ2-9): Low Risk  (03/31/2022)  Financial Resource Strain: Low Risk  (03/31/2022)  Physical Activity: Inactive (03/31/2022)  Social Connections: Moderately Integrated (03/31/2022)  Stress: No Stress Concern Present (03/31/2022)  Tobacco Use: Medium Risk (09/12/2022)   SDOH Interventions:     Readmission Risk Interventions     No data to display

## 2022-09-16 NOTE — Discharge Summary (Signed)
Physician Discharge Summary  Emily Hughes [id number] DOB: 1940/12/13 DOA: 09/12/2022  PCP: Susy Frizzle, MD  Admit date: 09/12/2022 Discharge date: 09/16/2022  Admitted From: Home  Discharge disposition: SNF  Recommendations for Outpatient Follow-Up:   Follow up with your primary care provider at the skilled nursing facility in 3 to 5 days. Check CBC, BMP, magnesium in the next visit Follow-up with nephrology in 2 to 3 weeks. Patient was admitted with encephalopathy ,avoid narcotics sedative-hypnotics if possible.  If needed please introduce very low-dose and monitor closely.   Discharge Diagnosis:   Principal Problem:   Acute metabolic encephalopathy Active Problems:   Essential hypertension   CAD (coronary artery disease)   Seizures (HCC)   Chronic kidney disease (CKD), stage IV (severe) (HCC)   Anemia in chronic kidney disease  Discharge Condition: Improved.  Diet recommendation: Low sodium, heart healthy.  Carbohydrate-modified.    Wound care: None.  Code status: Full.   History of Present Illness:   Patient is a 82 year old female with past medical history of CAD, CKD, HTN, diabetes mellitus type II presented to hospital with multiple episodes of falling and disorientation at home for 3 to 4 days.  Family stated that they were not sure if she was taking her medications.  In the ED, patient was confused.  Initial labs were unremarkable except for creatinine elevated at 4.6 at baseline.  Blood pressure was elevated at 201/104.  Urinalysis, chest x-ray and CT head scan was normal. WBC 6.7. FSBS 133.  Patient was able considered for admission to hospital for further evaluation and treatment. After hospitalization, extensive workup was done which was notable for CKD alone.  Likely her symptoms were because of ongoing use of Flexeril and oxycodone/accelerated hypertension.  Mentation has improved at this time.  Physical therapy saw the patient and has recommended  skilled nursing facility at this time due to extreme debility and deconditioning.  Hospital Course:   Following conditions were addressed during hospitalization as listed below,  Acute metabolic encephalopathy likely secondary to polypharmacy in the context of advanced CKD. Improved at this time.  Initially thought to be multifactorial likely from polypharmacy, hypertensive encephalopathy, uremia.  Initial labs were relatively stable except for elevated creatinine level.  Ammonia level within range.  HIV nonreactive RPR was nonreactive.  TSH within normal range.  Vitamin B12 at 220.  Blood cultures have been negative.  COVID, influenza negative.  Urine drug screen was negative.  Urinalysis was negative as well.  Chest x-ray without any infiltrate.  CT head was negative. Patient on Roxicodone and Flexeril as outpatient for arthritis.  Discontinued Flexeril and oxycodone.  EEG was unremarkable.  Urine drug screen did not show any opiates.  Tylenol level was less than 10.   Continue Tylenol for pain.  No signs of opiate withdrawal we will continue to monitor.  Encephalopathy has improved at this time.   Chronic kidney disease IV/metabolic acidosis/mild hyperkalemia On Rocaltrol, bicarb, Veltassa at home.  BUN elevated at 39 with creatinine of 4.2 on presentation..  Nephrology was consulted due to question of uremia.  Neurology recommending sodium bicarbonate tablet and closer monitoring.  In the past close he had discussion with patient and the family and plan was not to proceed with dialysis.   Latest potassium of 4.4.   Diabetes mellitus type 2. Continue  insulin  Lantus.  Latest POC glucose of 170.   CAD (coronary artery disease) Patient is on pravastatin, metoprolol, aspirin   Gout -Colchicine has been  changed to every other day, continue Uloric.    Essential hypertension Continue clonidine, metoprolol and Cardizem.  Overall improved.  Will need compliance with antihypertensive.   Debility,  deconditioning, bilateral knee arthritis.  Patient was seen by physical therapy and recommended skilled nursing facility at this time.  At this time oxycodone and Flexeril has been discontinued.  Please continue diclofenac gel Tylenol and introduce any pain medication with caution.  Disposition.  At this time, patient is stable for disposition to skilled nursing facility with outpatient nephrology follow-up.  Tried to reach the patient's sister but was unable to reach today.  Medical Consultants:   Nephrology  Procedures:    None Subjective:   Today, patient was seen and examined at bedside.Patient denies interval complaints. Feels okay. Denies nausea vomiting hallucinations and delirium or confusion.   Discharge Exam:   Vitals:   09/16/22 0458 09/16/22 1008  BP: (!) 146/67 (!) 159/67  Pulse: (!) 50 61  Resp:  18  Temp: 97.8 F (36.6 C) 98 F (36.7 C)  SpO2: 100% 100%   Vitals:   09/15/22 1500 09/15/22 2103 09/16/22 0458 09/16/22 1008  BP: (!) 153/80 (!) 166/74 (!) 146/67 (!) 159/67  Pulse: 65 61 (!) 50 61  Resp: '16 18  18  '$ Temp: 97.8 F (36.6 C) 97.9 F (36.6 C) 97.8 F (36.6 C) 98 F (36.7 C)  TempSrc: Oral Oral Oral   SpO2: 100% 100% 100% 100%  Weight:   90.1 kg   Height:       Body mass index is 35.19 kg/m.    General: Alert awake and communicative, obese built HENT:   No scleral pallor or icterus noted. Oral mucosa is moist.  Chest:    Diminished breath sounds bilaterally. No crackles or wheezes.  CVS: S1 &S2 heard. No murmur.  Regular rate and rhythm. Abdomen: Soft, nontender, nondistended.  Bowel sounds are heard.   Extremities: No cyanosis, clubbing or edema.  Peripheral pulses are palpable.  Bilateral knee joint tenderness.   Psych: Alert, awake oriented, impaired memory, CNS:  No cranial nerve deficits.  Able to move extremities. Skin: Warm and dry.  No rashes noted.  The results of significant diagnostics from this hospitalization (including imaging,  microbiology, ancillary and laboratory) are listed below for reference.     Diagnostic Studies:   EEG adult  Result Date: 09/13/2022 Lora Havens, MD     09/13/2022  4:53 PM Patient Name: Emily Hughes MRN: [id number] Epilepsy Attending: Lora Havens Referring Physician/Provider: Flora Lipps, MD Date: 09/13/2022 Duration: 22.06 mins Patient history: 82yo F with ams. EEG to evaluate for seizure Level of alertness: Awake, asleep AEDs during EEG study: None Technical aspects: This EEG study was done with scalp electrodes positioned according to the 10-20 International system of electrode placement. Electrical activity was reviewed with band pass filter of 1-'70Hz'$ , sensitivity of 7 uV/mm, display speed of 25m/sec with a '60Hz'$  notched filter applied as appropriate. EEG data were recorded continuously and digitally stored.  Video monitoring was available and reviewed as appropriate. Description: The posterior dominant rhythm consists of 8 Hz activity of moderate voltage (25-35 uV) seen predominantly in posterior head regions, symmetric and reactive to eye opening and eye closing. Sleep was characterized by vertex waves, sleep spindles (12 to 14 Hz), maximal frontocentral region. EEG showed intermittent generalized 3 to 6 Hz theta-delta slowing. Hyperventilation and photic stimulation were not performed.   ABNORMALITY - Intermittent slow, generalized IMPRESSION: This study is suggestive of mild diffuse  encephalopathy, nonspecific etiology. No seizures or epileptiform discharges were seen throughout the recording. Lora Havens     Labs:   Basic Metabolic Panel: Recent Labs  Lab 09/12/22 1610 09/13/22 0024 09/13/22 0524 09/14/22 0820 09/15/22 0248  NA 139  --  141 141 139  K 5.3*  --  5.3* 4.9 4.4  CL 111  --  115* 115* 111  CO2 21*  --  17* 17* 19*  GLUCOSE 196*  --  162* 111* 105*  BUN 43*  --  39* 39* 36*  CREATININE 4.67* 4.44* 4.26* 4.30* 4.44*  CALCIUM 10.0  --  9.4 9.0 8.6*   MG  --   --   --   --  2.0   GFR Estimated Creatinine Clearance: 10.9 mL/min (A) (by C-G formula based on SCr of 4.44 mg/dL (H)). Liver Function Tests: No results for input(s): "AST", "ALT", "ALKPHOS", "BILITOT", "PROT", "ALBUMIN" in the last 168 hours. No results for input(s): "LIPASE", "AMYLASE" in the last 168 hours. Recent Labs  Lab 09/13/22 0029  AMMONIA 24   Coagulation profile No results for input(s): "INR", "PROTIME" in the last 168 hours.  CBC: Recent Labs  Lab 09/12/22 1610 09/13/22 0024 09/13/22 0524 09/14/22 0820 09/15/22 0248  WBC 6.4 6.7 6.4 6.0 5.2  NEUTROABS 4.7  --  5.1  --   --   HGB 8.9* 9.2* 8.1* 8.7* 7.8*  HCT 29.8* 29.0* 26.2* 28.2* 24.5*  MCV 87.4 85.8 87.9 85.7 85.1  PLT 230 224 216 PLATELET CLUMPS NOTED ON SMEAR, UNABLE TO ESTIMATE 220   Cardiac Enzymes: Recent Labs  Lab 09/13/22 0024  CKTOTAL 175   BNP: Invalid input(s): "POCBNP" CBG: Recent Labs  Lab 09/15/22 0749 09/15/22 1148 09/15/22 1558 09/15/22 1931 09/15/22 2103  GLUCAP 101* 124* 158* 144* 170*   D-Dimer No results for input(s): "DDIMER" in the last 72 hours. Hgb A1c No results for input(s): "HGBA1C" in the last 72 hours. Lipid Profile No results for input(s): "CHOL", "HDL", "LDLCALC", "TRIG", "CHOLHDL", "LDLDIRECT" in the last 72 hours. Thyroid function studies No results for input(s): "TSH", "T4TOTAL", "T3FREE", "THYROIDAB" in the last 72 hours.  Invalid input(s): "FREET3" Anemia work up Recent Labs    09/15/22 0248  FERRITIN 303  TIBC 164*  IRON 52   Microbiology Recent Results (from the past 240 hour(s))  Resp panel by RT-PCR (RSV, Flu A&B, Covid) Anterior Nasal Swab     Status: None   Collection Time: 09/12/22  4:15 PM   Specimen: Anterior Nasal Swab  Result Value Ref Range Status   SARS Coronavirus 2 by RT PCR NEGATIVE NEGATIVE Final   Influenza A by PCR NEGATIVE NEGATIVE Final   Influenza B by PCR NEGATIVE NEGATIVE Final    Comment: (NOTE) The Xpert  Xpress SARS-CoV-2/FLU/RSV plus assay is intended as an aid in the diagnosis of influenza from Nasopharyngeal swab specimens and should not be used as a sole basis for treatment. Nasal washings and aspirates are unacceptable for Xpert Xpress SARS-CoV-2/FLU/RSV testing.  Fact Sheet for Patients: EntrepreneurPulse.com.au  Fact Sheet for Healthcare Providers: IncredibleEmployment.be  This test is not yet approved or cleared by the [place] FDA and has been authorized for detection and/or diagnosis of SARS-CoV-2 by FDA under an Emergency Use Authorization (EUA). This EUA will remain in effect (meaning this test can be used) for the duration of the COVID-19 declaration under Section 564(b)(1) of the Act, 21 U.S.C. section 360bbb-3(b)(1), unless the authorization is terminated or revoked.  Resp Syncytial Virus by PCR NEGATIVE NEGATIVE Final    Comment: (NOTE) Fact Sheet for Patients: EntrepreneurPulse.com.au  Fact Sheet for Healthcare Providers: IncredibleEmployment.be  This test is not yet approved or cleared by the [place] FDA and has been authorized for detection and/or diagnosis of SARS-CoV-2 by FDA under an Emergency Use Authorization (EUA). This EUA will remain in effect (meaning this test can be used) for the duration of the COVID-19 declaration under Section 564(b)(1) of the Act, 21 U.S.C. section 360bbb-3(b)(1), unless the authorization is terminated or revoked.  Performed at [facility], [place] [address], [place], [place] [address]   Culture, blood (Routine X 2) w Reflex to ID Panel     Status: None (Preliminary result)   Collection Time: 09/12/22 11:45 PM   Specimen: BLOOD  Result Value Ref Range Status   Specimen Description BLOOD RIGHT HAND  Final   Special Requests   Final    BOTTLES DRAWN AEROBIC AND ANAEROBIC Blood Culture results may not be optimal due to an inadequate  volume of blood received in culture bottles   Culture   Final    NO GROWTH 4 DAYS Performed at [facility], [place] [address], [place], [place] [address]    Report Status PENDING  Incomplete  Culture, blood (Routine X 2) w Reflex to ID Panel     Status: None (Preliminary result)   Collection Time: 09/13/22 12:29 AM   Specimen: BLOOD  Result Value Ref Range Status   Specimen Description BLOOD RIGHT ARM  Final   Special Requests   Final    BOTTLES DRAWN AEROBIC AND ANAEROBIC Blood Culture adequate volume   Culture   Final    NO GROWTH 3 DAYS Performed at [facility], [address], [place], [place] [address]    Report Status PENDING  Incomplete     Discharge Instructions:   Discharge Instructions     Call MD for:  difficulty breathing, headache or visual disturbances   Complete by: As directed    Call MD for:  persistant nausea and vomiting   Complete by: As directed    Call MD for:  severe uncontrolled pain   Complete by: As directed    Diet Carb Modified   Complete by: As directed    Discharge instructions   Complete by: As directed    Follow-up with your primary care provider at the skilled nursing facility in 3 to 5 days.  Follow-up with nephrology in 2 to 3 weeks.  Seek medical attention for worsening symptoms.   Increase activity slowly   Complete by: As directed       Allergies as of 09/16/2022       Reactions   5-alpha Reductase Inhibitors    Ace Inhibitors    Actos [pioglitazone Hydrochloride]    Angiotensin Receptor Blockers Hives   Dust Mite Extract    Dye Fdc Red [fd&c Red #3 (erythrosine Sodium)]    Fish Allergy    Iodinated Contrast Media Hives   Pioglitazone    Other reaction(s): swelling        Medication List     STOP taking these medications    amLODipine 10 MG tablet Commonly known as: NORVASC   cephALEXin 500 MG capsule Commonly known as: KEFLEX   collagenase 250 UNIT/GM ointment Commonly known as: SANTYL    cyclobenzaprine 5 MG tablet Commonly known as: FLEXERIL   diltiazem 300 MG 24 hr capsule Commonly known as: CARDIZEM CD  HYDROcodone bit-homatropine 5-1.5 MG/5ML syrup Commonly known as: HYCODAN   oxyCODONE 5 MG immediate release tablet Commonly known as: Oxy IR/ROXICODONE   Veltassa 8.4 g packet Generic drug: patiromer       TAKE these medications    acetaminophen 325 MG tablet Commonly known as: TYLENOL Take 2 tablets (650 mg total) by mouth every 6 (six) hours as needed for mild pain (or Fever >/= 101).   aspirin 81 MG tablet Take 81 mg by mouth daily.   B-D ULTRAFINE III SHORT PEN 31G X 8 MM Misc Generic drug: Insulin Pen Needle USE WITH LANTUS.   calcitRIOL 0.25 MCG capsule Commonly known as: ROCALTROL Take 0.25 mcg by mouth daily.   cloNIDine 0.1 MG tablet Commonly known as: CATAPRES Take 0.1 mg by mouth 2 (two) times daily.   colchicine 0.6 MG tablet Take 1 tablet (0.6 mg total) by mouth every other day. What changed: when to take this   diclofenac Sodium 1 % Gel Commonly known as: Voltaren Apply 2 g topically 4 (four) times daily.   diltiazem 300 MG 24 hr capsule Commonly known as: TIAZAC Take 1 capsule (300 mg total) by mouth daily.   famotidine 40 MG tablet Commonly known as: PEPCID Take 1 tablet (40 mg total) by mouth at bedtime.   febuxostat 40 MG tablet Commonly known as: ULORIC Take 1 tablet (40 mg total) by mouth daily.   Fluad Quadrivalent 0.5 ML injection Generic drug: influenza vaccine adjuvanted   furosemide 40 MG tablet Commonly known as: LASIX TAKE 2 TABLETS IN THE MORNING AND 1 TABLET IN THE EVENING. What changed:  how much to take how to take this when to take this additional instructions   HumaLOG 100 UNIT/ML injection Generic drug: insulin lispro INJECT 0.1ML (10 UNITS) INTO SKIN DAILY WITH LUNCH What changed: See the new instructions.   Iron (Ferrous Sulfate) 325 (65 Fe) MG Tabs Take 325 mg by mouth 2 (two)  times daily.   Lantus SoloStar 100 UNIT/ML Solostar Pen Generic drug: insulin glargine Inject 10 Units into the skin daily. What changed: how much to take   levothyroxine 50 MCG tablet Commonly known as: SYNTHROID TAKE 1 TABLET BY MOUTH DAILY   metoprolol succinate 50 MG 24 hr tablet Commonly known as: TOPROL-XL TAKE 1 TABLET BY MOUTH  DAILY WITH OR IMMEDIATELY  FOLLOWING A MEAL   pantoprazole 40 MG tablet Commonly known as: PROTONIX Take 1 tablet (40 mg total) by mouth daily.   polyethylene glycol 17 g packet Commonly known as: MIRALAX / GLYCOLAX Take 17 g by mouth daily.   pravastatin 80 MG tablet Commonly known as: PRAVACHOL Take 1 tablet (80 mg total) by mouth at bedtime.   senna 8.6 MG Tabs tablet Commonly known as: SENOKOT Take 2 tablets (17.2 mg total) by mouth at bedtime.   sodium bicarbonate 650 MG tablet Take 1 tablet (650 mg total) by mouth 4 (four) times daily. What changed:  how much to take when to take this   sucralfate 1 g tablet Commonly known as: Carafate Take 1 tablet (1 g total) by mouth 4 (four) times daily -  with meals and at bedtime.          Time coordinating discharge: 39 minutes  Signed:  Chee Dimon  [facility] 09/16/2022, 11:58 AM

## 2022-09-16 NOTE — TOC Transition Note (Signed)
Transition of Care [facility]) - CM/SW Discharge Note   Patient Details  Name: Emily Hughes MRN: [id number] Date of Birth: July 03, 1941  Transition of Care [facility]) CM/SW Contact:  Milinda Antis, [place] Phone Number: 09/16/2022, 1:42 PM   Clinical Narrative:    Patient will DC to: [facility] Anticipated DC date: 3.14.2024 Family notified: Yes, sister Transport by: Corey Harold   Per MD patient ready for DC to SNF. RN to call report prior to discharge [phone] room 907). RN, patient, patient's family, and facility notified of DC. Discharge Summary sent to facility. DC packet on chart. Ambulance transport will be requested for patient.   CSW will sign off for now as social work intervention is no longer needed. Please consult [place] again if new needs arise.    Final next level of care: Skilled Nursing Facility Barriers to Discharge: Barriers Resolved   Patient Goals and CMS Choice CMS Medicare.gov Compare Post Acute Care list provided to:: Patient Represenative (must comment) Choice offered to / list presented to : Sibling  Discharge Placement                Patient chooses bed at: [facility] Patient to be transferred to facility by: [place] Name of family member notified: Thomasenia Sales (Sister) [phone] Patient and family notified of of transfer: 09/16/22  Discharge Plan and Services Additional resources added to the After Visit Summary for   In-house Referral: Clinical Social Work                                   Social Determinants of Health ([place]) Interventions SDOH Screenings   Food Insecurity: No Food Insecurity (03/31/2022)  Housing: Low Risk  (03/31/2022)  Transportation Needs: No Transportation Needs (03/31/2022)  Utilities: Not At Risk (03/31/2022)  Alcohol Screen: Low Risk  (03/31/2022)  Depression (PHQ2-9): Low Risk  (03/31/2022)  Financial Resource Strain: Low Risk  (03/31/2022)  Physical Activity: Inactive (03/31/2022)  Social Connections: Moderately  Integrated (03/31/2022)  Stress: No Stress Concern Present (03/31/2022)  Tobacco Use: Medium Risk (09/12/2022)     Readmission Risk Interventions     No data to display

## 2022-09-16 NOTE — Progress Notes (Signed)
Spoke with Phineas Real at [facility] and gave phone report. All questions were answered and she had no further questions. Awaiting [facility] ambulance arrival. AVS and other paperwork ready for transfer.

## 2022-09-16 NOTE — Progress Notes (Signed)
PROGRESS NOTE    Emily Hughes  [id number] DOB: 08/02/1940 DOA: 09/12/2022 PCP: Susy Frizzle, MD    Brief Narrative:  Patient is a 82 year old female with past medical history of CAD, CKD, HTN, diabetes mellitus type II presented to hospital with multiple episodes of falling and disorientation at home for 3 to 4 days.  Family stated that they were not sure if she was taking her medications.  In the ED, patient was confused.  Initial labs were unremarkable except for creatinine elevated at 4.6 at baseline.  Blood pressure was elevated at 201/104.  Urinalysis, chest x-ray and CT head scan was normal. WBC 6.7. FSBS 133.  Patient was able considered for admission to hospital for further evaluation and treatment.  After hospitalization, extensive workup was done which was notable for CKD alone.  Likely her symptoms were because of ongoing use of Flexeril and oxycodone/accelerated hypertension.  Mentation has improved at this time.  Physical therapy saw the patient and has recommended skilled nursing facility at this time due to extreme debility and deconditioning.    Assessment and plan.  Acute metabolic encephalopathy likely secondary to polypharmacy in the context of advanced CKD. Improved at this time.  Initially thought to be multifactorial likely from polypharmacy, hypertensive encephalopathy, uremia.  Initial labs were relatively stable except for elevated creatinine level.  Ammonia level within range.  HIV nonreactive RPR was nonreactive.  TSH within normal range.  Vitamin B12 at 220.  Blood cultures have been negative.  COVID, influenza negative.  Urine drug screen was negative.  Urinalysis was negative as well.  Chest x-ray without any infiltrate.  CT head was negative. Patient on Roxicodone and Flexeril as outpatient for arthritis.  Discontinued Flexeril and oxycodone.  EEG was unremarkable.  Urine drug screen did not show any opiates.  Tylenol level was less than 10.   Continue  Tylenol for pain.  No signs of opiate withdrawal we will continue to monitor.  Encephalopathy has improved at this time.  Chronic kidney disease IV/metabolic acidosis/mild hyperkalemia On Rocaltrol, bicarb, Veltassa at home.  BUN elevated at 39 with creatinine of 4.2 on presentation..  Nephrology was consulted due to question of uremia.  Neurology recommending sodium bicarbonate tablet and closer monitoring.  In the past close he had discussion with patient and the family and plan was not to proceed with dialysis.   On as needed Veltassa for hyperkalemia..  Latest potassium of 4.4.   Diabetes mellitus type 2. Continue sliding scale insulin, Lantus.  Latest POC glucose of 170.   CAD (coronary artery disease) Patient is on pravastatin, metoprolol, aspirin   Gout -Colchicine has been continued    Essential hypertension Continue clonidine, metoprolol and Cardizem.  Overall improved  Debility, deconditioning, bilateral knee arthritis.  Patient was seen by physical therapy and recommended skilled nursing facility at this time.    DVT prophylaxis: heparin injection 5,000 Units Start: 09/13/22 0600   Code Status:     Code Status: Full Code  Disposition: Skilled nursing facility when bed is available.  Patient is medically stable for disposition.  Status is: Inpatient  Remains inpatient appropriate because: Need for skilled nursing facility rehabilitation.  Consultants:  Nephrology  Procedures:  None  Antimicrobials:  None.  Anti-infectives (From admission, onward)    None      Subjective:  Today, patient was seen and examined at bedside.  Patient denies interval complaints.  Feels okay.  Denies nausea vomiting hallucinations and delirium or confusion.  Objective: Vitals:  09/15/22 1500 09/15/22 2103 09/16/22 0458 09/16/22 1008  BP: (!) 153/80 (!) 166/74 (!) 146/67 (!) 159/67  Pulse: 65 61 (!) 50 61  Resp: '16 18  18  '$ Temp: 97.8 F (36.6 C) 97.9 F (36.6 C) 97.8 F  (36.6 C) 98 F (36.7 C)  TempSrc: Oral Oral Oral   SpO2: 100% 100% 100% 100%  Weight:   90.1 kg   Height:        Intake/Output Summary (Last 24 hours) at 09/16/2022 1036 Last data filed at 09/16/2022 0531 Gross per 24 hour  Intake 220 ml  Output 650 ml  Net -430 ml    Filed Weights   09/12/22 1541 09/13/22 0954 09/16/22 0458  Weight: 85.6 kg 88.7 kg 90.1 kg    Physical Examination: Body mass index is 35.19 kg/m.   General: Alert awake and communicative, obese built HENT:   No scleral pallor or icterus noted. Oral mucosa is moist.  Chest:    Diminished breath sounds bilaterally. No crackles or wheezes.  CVS: S1 &S2 heard. No murmur.  Regular rate and rhythm. Abdomen: Soft, nontender, nondistended.  Bowel sounds are heard.   Extremities: No cyanosis, clubbing or edema.  Peripheral pulses are palpable.  Bilateral knee joint tenderness.   Psych: Alert, awake oriented, impaired memory, CNS:  No cranial nerve deficits.  Able to move extremities. Skin: Warm and dry.  No rashes noted.  Data Reviewed:   CBC: Recent Labs  Lab 09/12/22 1610 09/13/22 0024 09/13/22 0524 09/14/22 0820 09/15/22 0248  WBC 6.4 6.7 6.4 6.0 5.2  NEUTROABS 4.7  --  5.1  --   --   HGB 8.9* 9.2* 8.1* 8.7* 7.8*  HCT 29.8* 29.0* 26.2* 28.2* 24.5*  MCV 87.4 85.8 87.9 85.7 85.1  PLT 230 224 216 PLATELET CLUMPS NOTED ON SMEAR, UNABLE TO ESTIMATE 220     Basic Metabolic Panel: Recent Labs  Lab 09/12/22 1610 09/13/22 0024 09/13/22 0524 09/14/22 0820 09/15/22 0248  NA 139  --  141 141 139  K 5.3*  --  5.3* 4.9 4.4  CL 111  --  115* 115* 111  CO2 21*  --  17* 17* 19*  GLUCOSE 196*  --  162* 111* 105*  BUN 43*  --  39* 39* 36*  CREATININE 4.67* 4.44* 4.26* 4.30* 4.44*  CALCIUM 10.0  --  9.4 9.0 8.6*  MG  --   --   --   --  2.0     Liver Function Tests: No results for input(s): "AST", "ALT", "ALKPHOS", "BILITOT", "PROT", "ALBUMIN" in the last 168 hours.   Radiology Studies: No results  found.    LOS: 4 days     Flora Lipps, MD [facility] Available via Epic secure chat 7am-7pm After these hours, please refer to coverage provider listed on [email or web address] 09/16/2022, 10:36 AM

## 2022-09-16 NOTE — Progress Notes (Signed)
Mobility Specialist Progress Note   09/16/22 1150  Mobility  Activity Transferred from bed to chair  Level of Assistance Moderate assist, patient does 50-74%  Assistive Device Stedy  Activity Response Tolerated well  Mobility Referral Yes  $Mobility charge 1 Mobility   Received pt in bed having no complaints and agreeable. Supervision to EOB and modA to stand in stedy. Left in chair w/ call bell in reach and family in room.    Holland Falling Mobility Specialist Please contact via SecureChat or  Rehab office at [phone]

## 2022-09-17 LAB — CULTURE, BLOOD (ROUTINE X 2): Culture: NO GROWTH

## 2022-09-18 LAB — CULTURE, BLOOD (ROUTINE X 2)
Culture: NO GROWTH
Special Requests: ADEQUATE

## 2022-09-20 ENCOUNTER — Telehealth: Payer: Self-pay | Admitting: Family Medicine

## 2022-09-20 NOTE — Telephone Encounter (Signed)
Patient's sister called to follow up on patient's care;. Patient no longer able to drive as per physician at the hospital during her recent admission. Sister is requesting a letter to that effect to present at [facility].   Patient's sister also requesting a call back to discuss further.  Please advise Ms. Coles at [phone].

## 2022-09-23 ENCOUNTER — Other Ambulatory Visit: Payer: Self-pay | Admitting: Family Medicine

## 2022-09-27 ENCOUNTER — Telehealth: Payer: Self-pay | Admitting: Family Medicine

## 2022-09-27 ENCOUNTER — Telehealth: Payer: Self-pay

## 2022-09-27 ENCOUNTER — Emergency Department (HOSPITAL_COMMUNITY)
Admission: EM | Admit: 2022-09-27 | Discharge: 2022-09-27 | Disposition: A | Discharge status: Skilled Nursing Facility | Payer: Medicare Other | Attending: Emergency Medicine | Admitting: Emergency Medicine

## 2022-09-27 ENCOUNTER — Emergency Department (HOSPITAL_COMMUNITY): Payer: Medicare Other

## 2022-09-27 ENCOUNTER — Encounter (HOSPITAL_COMMUNITY): Payer: Self-pay

## 2022-09-27 DIAGNOSIS — M25551 Pain in right hip: Secondary | ICD-10-CM | POA: Insufficient documentation

## 2022-09-27 MED ORDER — ACETAMINOPHEN 500 MG PO TABS
1000.0000 mg | ORAL_TABLET | Freq: Once | ORAL | Status: AC
  Administered 2022-09-27: 1000 mg via ORAL
  Filled 2022-09-27: qty 2

## 2022-09-27 MED ORDER — OXYCODONE HCL 5 MG PO TABS
5.0000 mg | ORAL_TABLET | Freq: Once | ORAL | Status: AC
  Administered 2022-09-27: 5 mg via ORAL
  Filled 2022-09-27: qty 1

## 2022-09-27 NOTE — ED Notes (Signed)
[facility] called for transport back to [facility].

## 2022-09-27 NOTE — Telephone Encounter (Signed)
Received call from [facility] with [facility] to follow up on script they received from this office for a hospital bed; requesting additional information as follows:  - progress notes for bed and wheelchair  - prognosis (99 mos recommended)   Please fax to [phone].  Please advise with questions at [phone].

## 2022-09-27 NOTE — ED Notes (Signed)
[facility] called for transport back to [facility].

## 2022-09-27 NOTE — ED Provider Notes (Signed)
  [facility] Provider Note   CSN: [id number] Arrival date & time: 09/27/22  1341     History No chief complaint on file.   HPI Emily Hughes is a 82 y.o. female presenting for right-sided hip pain.  Patient unable to provide any collateral history at this time.  Per chart that patient came with, she has had right hip pain since a fall last week..   Patient's recorded medical, surgical, social, medication list and allergies were reviewed in the Snapshot window as part of the initial history.   Review of Systems   Review of Systems  Unable to perform ROS: Dementia    Physical Exam Updated Vital Signs BP (!) 176/84 (BP Location: Left Arm)   Pulse 67   Temp 98.1 F (36.7 C) (Oral)   Resp 18   SpO2 99%  Physical Exam Vitals and nursing note reviewed.  Constitutional:      General: She is not in acute distress.    Appearance: She is well-developed.  HENT:     Head: Normocephalic and atraumatic.  Eyes:     Conjunctiva/sclera: Conjunctivae normal.  Cardiovascular:     Rate and Rhythm: Normal rate and regular rhythm.     Heart sounds: No murmur heard. Pulmonary:     Effort: Pulmonary effort is normal. No respiratory distress.     Breath sounds: Normal breath sounds.  Abdominal:     General: There is no distension.     Palpations: Abdomen is soft.     Tenderness: There is no abdominal tenderness. There is no right CVA tenderness or left CVA tenderness.  Musculoskeletal:        General: Tenderness (right hip pain) present. No swelling. Normal range of motion.     Cervical back: Neck supple.  Skin:    General: Skin is warm and dry.  Neurological:     General: No focal deficit present.     Mental Status: She is alert and oriented to person, place, and time. Mental status is at baseline.     Cranial Nerves: No cranial nerve deficit.      ED Course/ Medical Decision Making/ A&P    Procedures Procedures   Medications  Ordered in ED Medications  acetaminophen (TYLENOL) tablet 1,000 mg (1,000 mg Oral Given 09/27/22 1455)  oxyCODONE (Oxy IR/ROXICODONE) immediate release tablet 5 mg (5 mg Oral Given 09/27/22 1455)    Medical Decision Making:    Donzetta Sprang is a 82 y.o. female who presented to the ED today with right hip pain detailed above.     omplete initial physical exam performed, notably the patient  was hemodynamically stable in no acute distress.      Reviewed and confirmed nursing documentation for past medical history, family history, social history.    Initial Assessment:   Sent from nursing home with right hip pain x-ray showed potential fracture.  Repeat x-ray here which continues to be nondiagnostic.  Will perform CT of right hip and plan for disposition per. Patient is DNR, no answer on family phone numbers for further collateral information.   Clinical Impression:  1. Pain of right hip      Data Unavailable   Final Clinical Impression(s) / ED Diagnoses Final diagnoses:  Pain of right hip    Rx / DC Orders ED Discharge Orders     None         Tretha Sciara, MD 09/27/22 1609

## 2022-09-27 NOTE — ED Provider Notes (Signed)
  Physical Exam  BP (!) 176/84 (BP Location: Left Arm)   Pulse 67   Temp 98.1 F (36.7 C) (Oral)   Resp 18   SpO2 99%   Physical Exam  Procedures  Procedures  ED Course / MDM    Medical Decision Making Amount and/or Complexity of Data Reviewed Radiology: ordered.  Risk OTC drugs. Prescription drug management.   Received patient in signout hip pain after fall.  Patient cannot provide history due to dementia.  X-ray had nonspecific findings at nursing home and then again here.  CT scan done and reassuring with no fracture seen but did have some chronic tendinitis.  Discharge home.       Davonna Belling, MD 09/27/22 1721

## 2022-09-27 NOTE — Telephone Encounter (Signed)
Received call from Basilia Jumbo, pt's sister. Stanton Kidney asks if Geniece can be referred for Palliative Care? Pt is still in rehab but is not doing well per [facility]. Would the rehab center be the one to set the patient up for Palliative care or can we do that? Thanks.

## 2022-09-27 NOTE — ED Triage Notes (Addendum)
Pt BIB [facility] from [facility] and rehab. Pt had a fall yesterday and had difficulty ambulating so they got an xray. Xray shows a right hip fracture. They were also concerned about her kidney function. Results at bedside. Pt has hx of afib, takes diltiazem.     BP 152/70 HR 52 16 rr 99% room air

## 2022-09-27 NOTE — ED Notes (Signed)
Patient transported to X-ray 

## 2022-10-04 ENCOUNTER — Telehealth: Payer: Self-pay | Admitting: Family Medicine

## 2022-10-04 ENCOUNTER — Other Ambulatory Visit: Payer: Self-pay | Admitting: Family Medicine

## 2022-10-04 NOTE — Telephone Encounter (Signed)
Sister Emily Hughes called, she states that Hospice came out yesterday and is going to help with her care. Emily Hughes would like to know if Dr. Dennard Schaumann would like to see patient or if he is okay with patient seeing the hospice doctor.   CB# [phone]

## 2022-10-08 ENCOUNTER — Telehealth: Payer: Self-pay

## 2022-10-08 NOTE — Telephone Encounter (Signed)
Received call from pt's sister, Emily Hughes, who is in need of a letter stating what patient's medical conditions are and that they are causing her current health problems where she can not take care of herself.  Emily Hughes is going to court to establish POA for Emily Hughes, so she can take care of her medically and financially. Thanks.

## 2022-10-11 ENCOUNTER — Encounter: Payer: Self-pay | Admitting: Family Medicine

## 2022-10-15 ENCOUNTER — Telehealth: Payer: Self-pay

## 2022-10-15 NOTE — Telephone Encounter (Signed)
Pt's sister would like a cb from nurse to ask for a cough med that pt could take OTC. Please advise.  Cb#: [phone]

## 2022-10-19 ENCOUNTER — Telehealth: Payer: Self-pay | Admitting: Pharmacist

## 2022-10-19 NOTE — Progress Notes (Signed)
Care Management & Coordination Services Pharmacy Team  Reason for Encounter: General adherence update   Contacted patient for general health update and medication adherence call.  Spoke with family on 10/26/2022    What concerns do you have about your medications? Patients sister states they received a visit today at home from hospice. She reports that a medication was suggested to be discontinued. She states hospice will report to Dr. Durene Cal with said medication change request. Patient's sister is unsure of the name of the medication at this time. She scheduled an appt next month with PharmD to discuss further. 11/16/2022 at 10 am.  The patient denies side effects with their medications.   How often do you forget or accidentally miss a dose? Rarely  Do you use a pillbox? Yes  Are you having any problems getting your medications from your pharmacy? No  Has the cost of your medications been a concern? No If yes, what medication and is patient assistance available or has it been applied for?  Since last visit with PharmD, the following interventions have been made. If she is not taking Protonix start Protonix 40 mg daily. If she is taking Protonix I will add famotidine 40 mg at night   The patient has had an ED visit since last contact.   Patient denies concerns or questions for Erskine Emery, PharmD at this time.   Counseled patient on: Access to carecoordination team for any cost, medication or pharmacy concerns.   Chart Updates:  Recent office visits:  08/24/2022 OV (PCP) Donita Brooks, MD; I believe the bloating is likely due to acid reflux. Therefore of asked her to go home and that she is taking Protonix. If she is not taking Protonix start Protonix 40 mg daily. If she is taking Protonix I will add famotidine 40 mg at night   Recent consult visits:  None in previous 6 months  Hospital visits:  09/27/2022 ED visit for Pain of right hip - no medication changes  noted.  09/12/2022 ED to Hospital Admission due to Type 2 diabetes mellitus with ketoacidosis without coma Admit date: 09/12/2022 Discharge date: 09/16/2022 Patient was seen by physical therapy and recommended skilled nursing facility at this time.  At this time oxycodone and Flexeril has been discontinued.  Please continue diclofenac gel Tylenol and introduce any pain medication with caution.    Medications: Outpatient Encounter Medications as of 10/19/2022  Medication Sig Note   acetaminophen (TYLENOL) 325 MG tablet Take 2 tablets (650 mg total) by mouth every 6 (six) hours as needed for mild pain (or Fever >/= 101). 09/15/2022: Unknown if patient is taking.   aspirin 81 MG tablet Take 81 mg by mouth daily. 09/15/2022: Unknown if patient is taking.   B-D ULTRAFINE III SHORT PEN 31G X 8 MM MISC USE WITH LANTUS.    calcitRIOL (ROCALTROL) 0.25 MCG capsule Take 0.25 mcg by mouth daily.    cloNIDine (CATAPRES) 0.1 MG tablet Take 0.1 mg by mouth 2 (two) times daily.  09/15/2022: Unknown if patient is taking.   colchicine 0.6 MG tablet Take 1 tablet (0.6 mg total) by mouth every other day.    diclofenac Sodium (VOLTAREN) 1 % GEL Apply 2 g topically 4 (four) times daily. 09/15/2022: Unknown if patient is using.   diltiazem (TIAZAC) 300 MG 24 hr capsule Take 1 capsule (300 mg total) by mouth daily.    famotidine (PEPCID) 40 MG tablet Take 1 tablet (40 mg total) by mouth at bedtime.  febuxostat (ULORIC) 40 MG tablet Take 1 tablet (40 mg total) by mouth daily. 09/15/2022: Unknown if patient is taking.   FLUAD QUADRIVALENT 0.5 ML injection     furosemide (LASIX) 40 MG tablet TAKE 2 TABLETS IN THE MORNING AND 1 TABLET IN THE EVENING. (Patient taking differently: Take 40-80 mg by mouth See admin instructions. Take 80mg  by mouth in the morning and take 40mg  by mouth in the evening.) 09/15/2022: Unknown if patient is taking.   insulin lispro (HUMALOG) 100 UNIT/ML injection INJECT 0.1ML (10 UNITS) INTO SKIN DAILY  WITH LUNCH (Patient taking differently: Inject 10 Units into the skin daily with lunch.)    Iron, Ferrous Sulfate, 325 (65 Fe) MG TABS Take 325 mg by mouth 2 (two) times daily. 09/15/2022: Unknown if patient is taking.   LANTUS SOLOSTAR 100 UNIT/ML Solostar Pen Inject 10 Units into the skin daily.    levothyroxine (SYNTHROID) 50 MCG tablet TAKE 1 TABLET BY MOUTH DAILY 09/15/2022: Unknown if patient is taking.   metoprolol succinate (TOPROL-XL) 50 MG 24 hr tablet TAKE 1 TABLET BY MOUTH  DAILY WITH OR IMMEDIATELY  FOLLOWING A MEAL 09/15/2022: Unknown if patient is taking.   pantoprazole (PROTONIX) 40 MG tablet Take 1 tablet (40 mg total) by mouth daily.    polyethylene glycol (MIRALAX / GLYCOLAX) 17 g packet Take 17 g by mouth daily. 09/15/2022: Unknown if patient is taking.   pravastatin (PRAVACHOL) 80 MG tablet Take 1 tablet (80 mg total) by mouth at bedtime. 09/15/2022: Unknown if patient is taking.   senna (SENOKOT) 8.6 MG TABS tablet Take 2 tablets (17.2 mg total) by mouth at bedtime. 09/15/2022: Unknown if patient is taking.   sodium bicarbonate 650 MG tablet Take 1 tablet (650 mg total) by mouth 4 (four) times daily. (Patient taking differently: Take 1,300 mg by mouth 2 (two) times daily.)    sucralfate (CARAFATE) 1 G tablet Take 1 tablet (1 g total) by mouth 4 (four) times daily -  with meals and at bedtime. 09/15/2022: Unknown if patient is taking.   No facility-administered encounter medications on file as of 10/19/2022.    Recent vitals BP Readings from Last 3 Encounters:  09/27/22 (!) 169/114  09/16/22 (!) 159/67  08/24/22 130/70   Pulse Readings from Last 3 Encounters:  09/27/22 88  09/16/22 61  08/24/22 84   Wt Readings from Last 3 Encounters:  09/16/22 198 lb 10.2 oz (90.1 kg)  08/24/22 211 lb (95.7 kg)  06/08/22 211 lb (95.7 kg)   BMI Readings from Last 3 Encounters:  09/16/22 35.19 kg/m  08/24/22 37.38 kg/m  06/08/22 37.38 kg/m    Recent lab results    Component Value  Date/Time   NA 139 09/15/2022 0248   K 4.4 09/15/2022 0248   CL 111 09/15/2022 0248   CO2 19 (L) 09/15/2022 0248   GLUCOSE 105 (H) 09/15/2022 0248   BUN 36 (H) 09/15/2022 0248   CREATININE 4.44 (H) 09/15/2022 0248   CREATININE 4.65 (H) 08/24/2022 0910   CALCIUM 8.6 (L) 09/15/2022 0248    Lab Results  Component Value Date   CREATININE 4.44 (H) 09/15/2022   EGFR 9 (L) 08/24/2022   GFRNONAA 10 (L) 09/15/2022   GFRAA 11 (L) 02/07/2020   Lab Results  Component Value Date/Time   HGBA1C 5.7 (H) 08/24/2022 09:10 AM   HGBA1C 6.0 (H) 07/23/2021 11:03 AM   MICROALBUR 0.6 05/09/2017 09:08 AM   MICROALBUR 1.4 04/08/2014 08:36 AM    Lab Results  Component  Value Date   CHOL 191 08/24/2022   HDL 44 (L) 08/24/2022   LDLCALC 112 (H) 08/24/2022   TRIG 233 (H) 08/24/2022   CHOLHDL 4.3 08/24/2022    Care Gaps: Annual wellness visit in last year? Yes  If Diabetic: Last eye exam / retinopathy screening: 07/16/2021 Last diabetic foot exam: 09/01/2021  Star Rating Drugs:  Lantus Solostar 100u/mL last filled 10/07/2022 47 DS Pravastatin 80 mg last filled 09/16/2022 30 DS  Future Appointments  Date Time Provider Department Center  11/16/2022 10:00 AM Erroll Luna, [facility] None   April D Calhoun, [facility] Clinical Pharmacist Assistant [phone]

## 2022-10-26 ENCOUNTER — Telehealth: Payer: Self-pay

## 2022-10-26 NOTE — Telephone Encounter (Signed)
Rhiannon with [facility] called in to speak with nurse about pt's longstanding medications. Rhiannon would like a cb from nurse to discuss meds for pt please.  Cb#: Rhiannon with [facility] (mobile) [phone]

## 2022-10-29 ENCOUNTER — Other Ambulatory Visit: Payer: Self-pay | Admitting: Family Medicine

## 2022-11-01 NOTE — Telephone Encounter (Signed)
Requested Prescriptions  Pending Prescriptions Disp Refills   metoprolol succinate (TOPROL-XL) 50 MG 24 hr tablet [Pharmacy Med Name: Metoprolol Succinate ER 50 MG Oral Tablet Extended Release 24 Hour] 90 tablet 0    Sig: TAKE 1 TABLET BY MOUTH DAILY  WITH OR IMMEDIATELY FOLLOWING A  MEAL     Cardiovascular:  Beta Blockers Failed - 10/29/2022 10:22 PM      Failed - Last BP in normal range    BP Readings from Last 1 Encounters:  09/27/22 (!) 169/114         Failed - Valid encounter within last 6 months    Recent Outpatient Visits           1 year ago Chronic kidney disease (CKD), stage IV (severe) (HCC)   [facility] Pickard, Priscille Heidelberg, MD   1 year ago Essential hypertension   [facility] Donita Brooks, MD   2 years ago Type II diabetes mellitus with end-stage renal disease (HCC)   [facility] Donita Brooks, MD   3 years ago Type II diabetes mellitus with end-stage renal disease (HCC)   [facility] Donita Brooks, MD   4 years ago Type II diabetes mellitus with end-stage renal disease (HCC)   [facility] Pickard, Priscille Heidelberg, MD              Passed - Last Heart Rate in normal range    Pulse Readings from Last 1 Encounters:  09/27/22 88

## 2022-11-16 ENCOUNTER — Encounter: Payer: Medicare Other | Admitting: Pharmacist

## 2022-12-04 DEATH — deceased
